# Patient Record
Sex: Female | Born: 1935
Health system: Southern US, Community
[De-identification: ages and names within clinical notes are randomized; demographics above are authoritative.]

## PROBLEM LIST (undated history)

## (undated) DIAGNOSIS — G4733 Obstructive sleep apnea (adult) (pediatric): Secondary | ICD-10-CM

## (undated) DIAGNOSIS — I480 Paroxysmal atrial fibrillation: Secondary | ICD-10-CM

## (undated) DIAGNOSIS — Z86711 Personal history of pulmonary embolism: Secondary | ICD-10-CM

## (undated) DIAGNOSIS — H269 Unspecified cataract: Secondary | ICD-10-CM

## (undated) DIAGNOSIS — R42 Dizziness and giddiness: Secondary | ICD-10-CM

## (undated) DIAGNOSIS — N809 Endometriosis, unspecified: Secondary | ICD-10-CM

## (undated) DIAGNOSIS — J9610 Chronic respiratory failure, unspecified whether with hypoxia or hypercapnia: Secondary | ICD-10-CM

## (undated) DIAGNOSIS — M810 Age-related osteoporosis without current pathological fracture: Secondary | ICD-10-CM

## (undated) DIAGNOSIS — C4490 Unspecified malignant neoplasm of skin, unspecified: Secondary | ICD-10-CM

## (undated) DIAGNOSIS — I10 Essential (primary) hypertension: Secondary | ICD-10-CM

## (undated) DIAGNOSIS — Z9289 Personal history of other medical treatment: Secondary | ICD-10-CM

## (undated) DIAGNOSIS — M1990 Unspecified osteoarthritis, unspecified site: Secondary | ICD-10-CM

## (undated) DIAGNOSIS — L409 Psoriasis, unspecified: Secondary | ICD-10-CM

## (undated) HISTORY — PX: OOPHORECTOMY: SHX86

## (undated) HISTORY — DX: Unspecified osteoarthritis, unspecified site: M19.90

## (undated) HISTORY — DX: Personal history of other medical treatment: Z92.89

## (undated) HISTORY — DX: Essential (primary) hypertension: I10

## (undated) HISTORY — DX: Age-related osteoporosis without current pathological fracture: M81.0

## (undated) HISTORY — DX: Personal history of pulmonary embolism: Z86.711

## (undated) HISTORY — DX: Unspecified malignant neoplasm of skin, unspecified: C44.90

## (undated) HISTORY — PX: UVULOPALATOPHARYNGOPLASTY: SHX827

## (undated) HISTORY — PX: SKIN SURGERY: SHX2413

## (undated) HISTORY — DX: Obstructive sleep apnea (adult) (pediatric): G47.33

## (undated) HISTORY — DX: Endometriosis, unspecified: N80.9

## (undated) HISTORY — PX: OTHER SURGICAL HISTORY: SHX169

## (undated) HISTORY — PX: GALLBLADDER SURGERY: SHX652

## (undated) HISTORY — DX: Morbid (severe) obesity due to excess calories: E66.01

## (undated) HISTORY — DX: Chronic respiratory failure, unspecified whether with hypoxia or hypercapnia: J96.10

## (undated) HISTORY — DX: Dizziness and giddiness: R42

## (undated) HISTORY — DX: Unspecified cataract: H26.9

## (undated) HISTORY — PX: NOSE SURGERY: SHX723

## (undated) HISTORY — DX: Psoriasis, unspecified: L40.9

## (undated) HISTORY — DX: Paroxysmal atrial fibrillation: I48.0

---

## 2004-08-01 ENCOUNTER — Ambulatory Visit (HOSPITAL_COMMUNITY): Admission: RE | Admit: 2004-08-01 | Discharge: 2004-08-01 | Payer: Self-pay | Admitting: Cardiology

## 2004-12-27 ENCOUNTER — Ambulatory Visit: Payer: Self-pay | Admitting: Pulmonary Disease

## 2007-06-11 ENCOUNTER — Encounter: Admission: RE | Admit: 2007-06-11 | Discharge: 2007-06-11 | Payer: Self-pay | Admitting: Family Medicine

## 2008-01-03 ENCOUNTER — Encounter: Payer: Self-pay | Admitting: Internal Medicine

## 2008-09-20 ENCOUNTER — Ambulatory Visit: Payer: Self-pay | Admitting: Interventional Radiology

## 2008-09-20 ENCOUNTER — Emergency Department (HOSPITAL_BASED_OUTPATIENT_CLINIC_OR_DEPARTMENT_OTHER): Admission: EM | Admit: 2008-09-20 | Discharge: 2008-09-20 | Payer: Self-pay | Admitting: Emergency Medicine

## 2008-12-21 ENCOUNTER — Encounter: Admission: RE | Admit: 2008-12-21 | Discharge: 2008-12-21 | Payer: Self-pay | Admitting: Otolaryngology

## 2009-06-18 ENCOUNTER — Emergency Department (HOSPITAL_BASED_OUTPATIENT_CLINIC_OR_DEPARTMENT_OTHER): Admission: EM | Admit: 2009-06-18 | Discharge: 2009-06-18 | Payer: Self-pay | Admitting: Emergency Medicine

## 2009-06-18 ENCOUNTER — Ambulatory Visit: Payer: Self-pay | Admitting: Diagnostic Radiology

## 2010-04-24 ENCOUNTER — Encounter: Payer: Self-pay | Admitting: Family Medicine

## 2010-06-27 LAB — APTT: aPTT: 43 seconds — ABNORMAL HIGH (ref 24–37)

## 2010-06-27 LAB — POCT CARDIAC MARKERS
Myoglobin, poc: 67.1 ng/mL (ref 12–200)
Myoglobin, poc: 87.2 ng/mL (ref 12–200)

## 2010-06-27 LAB — DIFFERENTIAL
Basophils Absolute: 0.2 10*3/uL — ABNORMAL HIGH (ref 0.0–0.1)
Basophils Relative: 2 % — ABNORMAL HIGH (ref 0–1)
Eosinophils Absolute: 0.2 10*3/uL (ref 0.0–0.7)
Monocytes Absolute: 0.7 10*3/uL (ref 0.1–1.0)
Monocytes Relative: 10 % (ref 3–12)
Neutrophils Relative %: 57 % (ref 43–77)

## 2010-06-27 LAB — CBC
HCT: 39.1 % (ref 36.0–46.0)
Hemoglobin: 13.3 g/dL (ref 12.0–15.0)
Platelets: 299 10*3/uL (ref 150–400)
WBC: 7.1 10*3/uL (ref 4.0–10.5)

## 2010-06-27 LAB — COMPREHENSIVE METABOLIC PANEL
ALT: 16 U/L (ref 0–35)
AST: 22 U/L (ref 0–37)
Albumin: 3.8 g/dL (ref 3.5–5.2)
CO2: 32 mEq/L (ref 19–32)
Calcium: 9.1 mg/dL (ref 8.4–10.5)
Chloride: 104 mEq/L (ref 96–112)
Glucose, Bld: 123 mg/dL — ABNORMAL HIGH (ref 70–99)
Total Protein: 7.5 g/dL (ref 6.0–8.3)

## 2010-06-27 LAB — PROTIME-INR
INR: 2.36 — ABNORMAL HIGH (ref 0.00–1.49)
Prothrombin Time: 25.6 seconds — ABNORMAL HIGH (ref 11.6–15.2)

## 2010-06-27 LAB — D-DIMER, QUANTITATIVE: D-Dimer, Quant: 0.31 ug/mL-FEU (ref 0.00–0.48)

## 2010-07-11 LAB — URINALYSIS, ROUTINE W REFLEX MICROSCOPIC
Ketones, ur: NEGATIVE mg/dL
Protein, ur: NEGATIVE mg/dL
Specific Gravity, Urine: 1.009 (ref 1.005–1.030)
Urobilinogen, UA: 0.2 mg/dL (ref 0.0–1.0)
pH: 7.5 (ref 5.0–8.0)

## 2010-07-11 LAB — BASIC METABOLIC PANEL
CO2: 34 mEq/L — ABNORMAL HIGH (ref 19–32)
Calcium: 9.4 mg/dL (ref 8.4–10.5)
Glucose, Bld: 88 mg/dL (ref 70–99)
Sodium: 143 mEq/L (ref 135–145)

## 2010-07-11 LAB — DIFFERENTIAL
Lymphs Abs: 1.9 10*3/uL (ref 0.7–4.0)
Monocytes Absolute: 0.5 10*3/uL (ref 0.1–1.0)
Neutrophils Relative %: 58 % (ref 43–77)

## 2010-07-11 LAB — CBC
HCT: 45.6 % (ref 36.0–46.0)
Hemoglobin: 13 g/dL (ref 12.0–15.0)
Platelets: 314 10*3/uL (ref 150–400)
RDW: 15.1 % (ref 11.5–15.5)
WBC: 6.2 10*3/uL (ref 4.0–10.5)

## 2010-07-11 LAB — PROTIME-INR: INR: 2.2 — ABNORMAL HIGH (ref 0.00–1.49)

## 2011-04-11 ENCOUNTER — Emergency Department (INDEPENDENT_AMBULATORY_CARE_PROVIDER_SITE_OTHER): Payer: PRIVATE HEALTH INSURANCE

## 2011-04-11 ENCOUNTER — Encounter (HOSPITAL_BASED_OUTPATIENT_CLINIC_OR_DEPARTMENT_OTHER): Payer: Self-pay | Admitting: Radiology

## 2011-04-11 ENCOUNTER — Emergency Department (HOSPITAL_BASED_OUTPATIENT_CLINIC_OR_DEPARTMENT_OTHER): Payer: PRIVATE HEALTH INSURANCE

## 2011-04-11 ENCOUNTER — Emergency Department (HOSPITAL_BASED_OUTPATIENT_CLINIC_OR_DEPARTMENT_OTHER)
Admission: EM | Admit: 2011-04-11 | Discharge: 2011-04-11 | Disposition: A | Discharge status: Home / Self Care | Payer: PRIVATE HEALTH INSURANCE | Attending: Emergency Medicine | Admitting: Emergency Medicine

## 2011-04-11 DIAGNOSIS — M7989 Other specified soft tissue disorders: Secondary | ICD-10-CM | POA: Insufficient documentation

## 2011-04-11 DIAGNOSIS — Z86718 Personal history of other venous thrombosis and embolism: Secondary | ICD-10-CM | POA: Insufficient documentation

## 2011-04-11 DIAGNOSIS — R911 Solitary pulmonary nodule: Secondary | ICD-10-CM

## 2011-04-11 DIAGNOSIS — R002 Palpitations: Secondary | ICD-10-CM | POA: Insufficient documentation

## 2011-04-11 DIAGNOSIS — M25519 Pain in unspecified shoulder: Secondary | ICD-10-CM | POA: Insufficient documentation

## 2011-04-11 DIAGNOSIS — R220 Localized swelling, mass and lump, head: Secondary | ICD-10-CM | POA: Insufficient documentation

## 2011-04-11 DIAGNOSIS — Z7901 Long term (current) use of anticoagulants: Secondary | ICD-10-CM | POA: Insufficient documentation

## 2011-04-11 DIAGNOSIS — M259 Joint disorder, unspecified: Secondary | ICD-10-CM | POA: Insufficient documentation

## 2011-04-11 DIAGNOSIS — R937 Abnormal findings on diagnostic imaging of other parts of musculoskeletal system: Secondary | ICD-10-CM

## 2011-04-11 DIAGNOSIS — R221 Localized swelling, mass and lump, neck: Secondary | ICD-10-CM | POA: Insufficient documentation

## 2011-04-11 DIAGNOSIS — J984 Other disorders of lung: Secondary | ICD-10-CM | POA: Insufficient documentation

## 2011-04-11 DIAGNOSIS — Z79899 Other long term (current) drug therapy: Secondary | ICD-10-CM | POA: Insufficient documentation

## 2011-04-11 DIAGNOSIS — M898X1 Other specified disorders of bone, shoulder: Secondary | ICD-10-CM

## 2011-04-11 LAB — COMPREHENSIVE METABOLIC PANEL
BUN: 12 mg/dL (ref 6–23)
Calcium: 9.7 mg/dL (ref 8.4–10.5)
Creatinine, Ser: 0.8 mg/dL (ref 0.50–1.10)
GFR calc Af Amer: 80 mL/min — ABNORMAL LOW (ref >90)
GFR calc non Af Amer: 69 mL/min — ABNORMAL LOW (ref >90)
Glucose, Bld: 110 mg/dL — ABNORMAL HIGH (ref 70–99)
Total Protein: 7.3 g/dL (ref 6.0–8.3)

## 2011-04-11 LAB — CBC
HCT: 41.7 % (ref 36.0–46.0)
Hemoglobin: 13.5 g/dL (ref 12.0–15.0)
MCH: 30.3 pg (ref 26.0–34.0)
MCV: 93.7 fL (ref 78.0–100.0)
Platelets: 297 10*3/uL (ref 150–400)
RBC: 4.45 MIL/uL (ref 3.87–5.11)

## 2011-04-11 LAB — DIFFERENTIAL
Eosinophils Absolute: 0.1 10*3/uL (ref 0.0–0.7)
Eosinophils Relative: 2 % (ref 0–5)
Lymphs Abs: 1.7 10*3/uL (ref 0.7–4.0)
Monocytes Absolute: 0.7 10*3/uL (ref 0.1–1.0)
Monocytes Relative: 11 % (ref 3–12)

## 2011-04-11 LAB — URINALYSIS, ROUTINE W REFLEX MICROSCOPIC
Leukocytes, UA: NEGATIVE
Nitrite: NEGATIVE
Specific Gravity, Urine: 1.007 (ref 1.005–1.030)
Urobilinogen, UA: 0.2 mg/dL (ref 0.0–1.0)
pH: 7 (ref 5.0–8.0)

## 2011-04-11 LAB — URINE CULTURE: Culture  Setup Time: 201301082133

## 2011-04-11 LAB — APTT: aPTT: 44 seconds — ABNORMAL HIGH (ref 24–37)

## 2011-04-11 LAB — PROTIME-INR: Prothrombin Time: 25.6 seconds — ABNORMAL HIGH (ref 11.6–15.2)

## 2011-04-11 LAB — D-DIMER, QUANTITATIVE: D-Dimer, Quant: <0.22 ug/mL-FEU (ref 0.00–0.48)

## 2011-04-11 MED ORDER — IOHEXOL 300 MG/ML  SOLN
100.0000 mL | Freq: Once | INTRAMUSCULAR | Status: AC | PRN
  Administered 2011-04-11: 100 mL via INTRAVENOUS

## 2011-04-11 NOTE — ED Notes (Signed)
MRI staff at bedside

## 2011-04-11 NOTE — ED Notes (Signed)
Pt c/o RUE pain & swelling x >1  Week; has seen PMD for same; condition not improving

## 2011-04-11 NOTE — ED Notes (Signed)
Patient transported to MRI 

## 2011-04-11 NOTE — ED Notes (Signed)
Patient transported to CT 

## 2011-04-11 NOTE — ED Provider Notes (Signed)
Imaging reviewed and unremarkable for concerning cause of pt's symptoms. Incidental lung nodule on CT. Printed out results and gave patient copy to share with her PMD. Aware that follow up is needed to further characterize. Family at beside during discussion.  BP 131/61  Pulse 59  Temp(Src) 97.9 F (36.6 C) (Oral)  Resp 18  Wt 300 lb (136.079 kg)  SpO2 96%   Blair Heys, MD 04/11/11 1914

## 2011-04-11 NOTE — ED Provider Notes (Signed)
History     CSN: [id number]  Arrival date & time 04/11/11  1248   First MD Initiated Contact with Patient 04/11/11 1254      Chief Complaint  Patient presents with  . Arm Pain    (Consider location/radiation/quality/duration/timing/severity/associated sxs/prior treatment) Patient is a 78 y.o. female presenting with shoulder pain. The history is provided by the patient. No language interpreter was used.  Shoulder Pain This is a new problem. The current episode started more than 1 week ago. The problem occurs constantly (Patient has had swelling in her right scapular region and right upper arm for about a week. She had seen her doctor about this and they have recommended MRI of her right shoulder. She also notes some palpitations, which has been a problem in the past. ). The problem has not changed since onset.Associated symptoms comments: Palpitations. The symptoms are aggravated by nothing. The symptoms are relieved by nothing. She has tried nothing (It is noteworthy that patient has been on Coumadin since 2006, when she had bilateral pulmonary emboli.) for the symptoms.    No past medical history on file.  No past surgical history on file.  No family history on file.  History  Substance Use Topics  . Smoking status: Not on file  . Smokeless tobacco: Not on file  . Alcohol Use: Not on file    OB History    No data available      Review of Systems  Constitutional: Negative.  Negative for fever and chills.  HENT: Negative.   Eyes: Negative.   Respiratory: Negative.   Cardiovascular: Positive for palpitations.  Gastrointestinal: Negative.   Genitourinary: Negative.   Musculoskeletal:       She has swelling in the right upper arm and right scapular region. There is no history of injury.  Skin: Negative.   Neurological: Negative.   Psychiatric/Behavioral: Negative.     Allergies  Review of patient's allergies indicates not on file.  Home Medications   Current  Outpatient Rx  Name Route Sig Dispense Refill  . ATENOLOL 25 MG PO TABS Oral Take 25 mg by mouth 2 (two) times daily.     Marland Kitchen BIMATOPROST 0.01 % OP SOLN Both Eyes Place 1 drop into both eyes at bedtime.     Marland Kitchen BRIMONIDINE TARTRATE-TIMOLOL 0.2-0.5 % OP SOLN Both Eyes Place 1 drop into both eyes every 12 (twelve) hours.     Marland Kitchen BRINZOLAMIDE 1 % OP SUSP Both Eyes Place 1 drop into both eyes 2 (two) times daily.     Marland Kitchen DILTIAZEM HCL ER 180 MG PO CP24 Oral Take 180 mg by mouth daily.      . FUROSEMIDE 20 MG PO TABS Oral Take 20 mg by mouth 2 (two) times daily.      Marland Kitchen HYDROCODONE-ACETAMINOPHEN 10-500 MG PO TABS Oral Take 1 tablet by mouth every 6 (six) hours as needed.      Marland Kitchen LORAZEPAM 1 MG PO TABS Oral Take 1 mg by mouth every 6 (six) hours as needed.      Marland Kitchen MECLIZINE HCL 25 MG PO TABS Oral Take 25 mg by mouth every 6 (six) hours as needed.      . WARFARIN SODIUM 2.5 MG PO TABS Oral Take 2.5 mg by mouth daily. Except Sunday     . WARFARIN SODIUM 2.5 MG PO TABS Oral Take 3.75 mg by mouth every Sunday. Sundays only       BP 162/57  Pulse 100  Temp(Src) 97.9 F (  36.6 C) (Oral)  Resp 18  SpO2 100%  Physical Exam  Nursing note and vitals reviewed. Constitutional: She is oriented to person, place, and time.       Morbidly obese woman in no distress  HENT:  Head: Normocephalic and atraumatic.  Right Ear: External ear normal.  Left Ear: External ear normal.  Mouth/Throat: Oropharynx is clear and moist.  Eyes: Conjunctivae and EOM are normal. Pupils are equal, round, and reactive to light. No scleral icterus.  Neck: Normal range of motion.  Cardiovascular: Normal rate, regular rhythm and normal heart sounds.   Pulmonary/Chest: Effort normal and breath sounds normal.  Abdominal: Soft. Bowel sounds are normal.  Musculoskeletal:       She has swelling over the right scapula and on the inner aspect of the right upper arm just below the humeral head. There is no redness heat or point tenderness.    Neurological: She is alert and oriented to person, place, and time.       No sensory or motor deficit.  Skin: Skin is warm and dry.  Psychiatric: She has a normal mood and affect. Her behavior is normal.    ED Course  Procedures (including critical care time)   Labs Reviewed  CBC  DIFFERENTIAL  COMPREHENSIVE METABOLIC PANEL  URINALYSIS, ROUTINE W REFLEX MICROSCOPIC  URINE CULTURE  PROTIME-INR  APTT  D-DIMER, QUANTITATIVE   1:34 PM Case discussed with Lorriane Shire M.D., radiologist. He advised that if we do MRI on the patient's right scapular region that we should use IV contrast, as well we are studying is a soft tissue mass.  2:18 PM Lab tests are reassuringly normal.  MRI of right shoulder with contrast ordered.  3:45 PM Pt going to MRI now.     MRI of right shoulder was negative.  CT of right shoulder showed a right upper lung nodule.  Referred back to her PCP for followup of this nodult.   1. Lung nodule   2. Arm swelling   3. Pain in scapula           Mylinda Latina III, MD 04/11/11 2014

## 2012-01-31 ENCOUNTER — Encounter: Payer: Self-pay | Admitting: *Deleted

## 2012-02-05 ENCOUNTER — Encounter: Payer: Self-pay | Admitting: Cardiovascular Disease

## 2012-02-05 ENCOUNTER — Ambulatory Visit (INDEPENDENT_AMBULATORY_CARE_PROVIDER_SITE_OTHER): Payer: PRIVATE HEALTH INSURANCE | Admitting: Cardiovascular Disease

## 2012-02-05 VITALS — BP 140/60 | HR 53 | Ht 66.0 in | Wt 242.8 lb

## 2012-02-05 DIAGNOSIS — I480 Paroxysmal atrial fibrillation: Secondary | ICD-10-CM

## 2012-02-05 DIAGNOSIS — I4891 Unspecified atrial fibrillation: Secondary | ICD-10-CM

## 2012-02-05 DIAGNOSIS — I10 Essential (primary) hypertension: Secondary | ICD-10-CM

## 2012-02-05 MED ORDER — DILTIAZEM HCL ER 240 MG PO CP24: 240.0000 mg | ORAL_CAPSULE | Freq: Every day | ORAL | Status: DC

## 2012-02-05 MED ORDER — ATENOLOL 25 MG PO TABS: ORAL_TABLET | ORAL | Status: DC

## 2012-02-05 NOTE — Assessment & Plan Note (Signed)
Jacqueline Nguyen presents with a hx of PAF.  She has several contributing factors - pulmonary emboli, obesity, sleep apnea, HTN.  She is on coumadin and her afib is not all that symptomatic.  I spent a long time explaining all of these issues to her and her son.  She seems to understand.   She frequently calls EMS when she has palpitations.  I have told her that she does not have to call EMS every time she has a palpitation.  We will continue her current meds . I will see her in 6 months.

## 2012-02-05 NOTE — Progress Notes (Signed)
Jacqueline Nguyen Date of Birth  08/31/1931       [facility]    [employer] [address], [place], [place]  [address]   [place], [place]  [address] [phone]     [phone]   Fax  [phone]    Fax [phone]  Problem List: 1.  Pulmonary embolus - 2009 massive bilateral PE 2. Hypertension 3. osteoperosis - severe degener 4.  Morbid obesity 5. Paroxysmal Atrial Fibrillation 6  sleep apnea - wears CPAP   History of Present Illness:  Jacqueline Nguyen is a 78 yo - former patient of Dr. Jory Sims - transferred care here. She was accompanied by her son, Jacqueline Nguyen today. She has had PAF and has had recent worsening episodes of her PAF.  She walks with a walker.    She has numerous complaints and appears to be easily distracted. It Was very helpful have her son present today. He was able to help her stay on target with are interview.   Current Outpatient Prescriptions on File Prior to Visit  Medication Sig Dispense Refill  . Acetylcysteine (NAC 600 PO) Take 600 mg by mouth daily as needed.      . Ascorbic Acid (VITAMIN C PO) Take 600 mg by mouth daily.      Marland Kitchen atenolol (TENORMIN) 25 MG tablet Take 25 mg by mouth 2 (two) times daily.       . bimatoprost (LUMIGAN) 0.01 % SOLN Place 1 drop into both eyes at bedtime.       . brimonidine-timolol (COMBIGAN) 0.2-0.5 % ophthalmic solution Place 1 drop into both eyes every 12 (twelve) hours.       . brinzolamide (AZOPT) 1 % ophthalmic suspension Place 1 drop into both eyes 2 (two) times daily.       Marland Kitchen diltiazem (DILACOR XR) 180 MG 24 hr capsule Take 180 mg by mouth daily.        . furosemide (LASIX) 20 MG tablet Take 20 mg by mouth as needed.       . Glucosamine Sulfate-MSM (MSM/GLUCOSAMINE) CREA Apply topically daily.      . Homeopathic Products (ARNICARE ARNICA) CREA Apply topically daily.      Marland Kitchen HYDROcodone-acetaminophen (LORTAB) 10-500 MG per tablet Take 1 tablet by mouth every 6 (six) hours as  needed.        Marland Kitchen LORazepam (ATIVAN) 1 MG tablet Take 1 mg by mouth every 6 (six) hours as needed.        Marland Kitchen MAGNESIUM PO Take by mouth daily.      . meclizine (ANTIVERT) 25 MG tablet Take 25 mg by mouth every 6 (six) hours as needed.        . Methylsulfonylmethane (MSM PO) Take 300 mg by mouth daily.      . Multiple Minerals (MINERAL COMPLEX PO) Take by mouth 3 (three) times daily.      Marland Kitchen OLIVE LEAF EXTRACT PO Take 450 mg by mouth daily.      . Omega-3 Fatty Acids (OMEGA 3 PO) Take 820 mg by mouth daily.      . Probiotic Product (PROBIOTIC DAILY PO) Take by mouth daily.      . Turmeric 500 MG CAPS Take 500 mg by mouth daily as needed.      . warfarin (COUMADIN) 2.5 MG tablet Take 2.5 mg by mouth daily. Except Sunday       . warfarin (COUMADIN) 2.5 MG tablet Take 3.75 mg by  mouth every Sunday. Sundays only         Allergies  Allergen Reactions  . Ciprofloxacin   . Bactrim Rash  . Codeine Nausea Only    Past Medical History  Diagnosis Date  . Obesities, morbid   . Skin cancer   . Cataract   . Sleep apnea   . Osteoporosis   . Arthritis   . Vertigo   . Blood clotting disorder     In Lungs  . Endometriosis     Past Surgical History  Procedure Date  . Oophorectomy   . Nose surgery   . Skin surgery   . Gallbladder surgery   . Uvulopalatopharyngoplasty   . Cataract surgery     History  Smoking status  . Former Smoker  Smokeless tobacco  . Not on file    History  Alcohol Use     History reviewed. No pertinent family history.  Reviw of Systems:  Reviewed in the HPI.  All other systems are negative.  Physical Exam: Blood pressure 140/60, pulse 53, height _0  (1.676 m), weight 242 lb 12.8 oz (110.133 kg). General: Well developed, well nourished, in no acute distress.  Head: Normocephalic, atraumatic, sclera non-icteric, mucus membranes are moist,   Neck: Supple. Carotids are 2 + without bruits. No JVD  Lungs: Clear bilaterally to auscultation.  Heart:  regular rate.  normal  S1 S2. No murmurs, gallops or rubs.  Abdomen: Soft, non-tender, non-distended with normal bowel sounds. No hepatomegaly. No rebound/guarding. No masses.  Msk:  Strength and tone are normal  Extremities: No clubbing or cyanosis. No edema.  Distal pedal pulses are 2+ and equal bilaterally.  Neuro: Alert and oriented X 3. Moves all extremities spontaneously.  Psych:  Responds to questions appropriately with a normal affect.  ECG: Nov. 4, 2013 - sinus brady at 53, no ST  Assessment / Plan:

## 2012-02-05 NOTE — Patient Instructions (Addendum)
Your physician wants you to follow-up in: 6 months  You will receive a reminder letter in the mail two months in advance. If you don't receive a letter, please call our office to schedule the follow-up appointment.  Your physician recommends that you continue on your current medications as directed. Please refer to the Current Medication list given to you today.

## 2012-02-08 ENCOUNTER — Telehealth: Payer: Self-pay | Admitting: Cardiovascular Disease

## 2012-02-08 NOTE — Telephone Encounter (Signed)
New problem:   Need a brief summary or conclusion from last office visit .

## 2012-02-08 NOTE — Telephone Encounter (Signed)
Mailed her another  AVS, pt agreed to plan.

## 2012-05-06 ENCOUNTER — Telehealth: Payer: Self-pay | Admitting: Cardiovascular Disease

## 2012-05-06 NOTE — Telephone Encounter (Signed)
New Problem:    Patient called returning a call.  Please call back.

## 2012-05-06 NOTE — Telephone Encounter (Signed)
Patient called after receiving a letter from the insurance company regarding Diltiazem and changes to benefits regarding generic form of medicine versus Brand Name version. Spoke to [employer]. Pharmacist states patient has been and is taking generic Diltiazem currently. RN phoned patient back to let her know that she is currently on the generic form.  Also patient had questions regarding insurance company's note regarding in home coumadin checks. Advised patient that she will need to contact PCP Learta Codding) on this issue since they are the providers that oversee her Coumadin.  She agreed to plan.  Verified the above information with patient's son as he was there with patient and wanted to confirm plan.

## 2012-05-06 NOTE — Telephone Encounter (Signed)
New Problem     Pt wants to make sure diltiazem stays the same and isn't changed to generic form.   Pt is requesting Dr. Acie Fredrickson send a note/verification to insurance company stating it is necessary for pt to continue doing in home coumadin checks. Insurance company is stating they are no longer going to cover her in home checks and for her to go in and for checks. Pt states it would be extremely difficult for her to come in to check her coumadin as frequently as she would need to.  Please call pt. Would like to speak to nurse about these two problems.

## 2012-08-29 ENCOUNTER — Inpatient Hospital Stay (HOSPITAL_BASED_OUTPATIENT_CLINIC_OR_DEPARTMENT_OTHER)
Admission: EM | Admit: 2012-08-29 | Discharge: 2012-08-31 | DRG: 123 | Disposition: A | Discharge status: Home / Self Care | Payer: Medicare Other | Attending: Internal Medicine | Admitting: Internal Medicine

## 2012-08-29 ENCOUNTER — Emergency Department (HOSPITAL_BASED_OUTPATIENT_CLINIC_OR_DEPARTMENT_OTHER): Payer: Medicare Other

## 2012-08-29 ENCOUNTER — Encounter (HOSPITAL_BASED_OUTPATIENT_CLINIC_OR_DEPARTMENT_OTHER): Payer: Self-pay | Admitting: *Deleted

## 2012-08-29 DIAGNOSIS — Z9981 Dependence on supplemental oxygen: Secondary | ICD-10-CM

## 2012-08-29 DIAGNOSIS — D689 Coagulation defect, unspecified: Secondary | ICD-10-CM | POA: Diagnosis present

## 2012-08-29 DIAGNOSIS — H5460 Unqualified visual loss, one eye, unspecified: Secondary | ICD-10-CM | POA: Diagnosis not present

## 2012-08-29 DIAGNOSIS — I10 Essential (primary) hypertension: Secondary | ICD-10-CM | POA: Diagnosis present

## 2012-08-29 DIAGNOSIS — I517 Cardiomegaly: Secondary | ICD-10-CM

## 2012-08-29 DIAGNOSIS — R42 Dizziness and giddiness: Secondary | ICD-10-CM | POA: Diagnosis present

## 2012-08-29 DIAGNOSIS — E785 Hyperlipidemia, unspecified: Secondary | ICD-10-CM | POA: Diagnosis present

## 2012-08-29 DIAGNOSIS — G453 Amaurosis fugax: Secondary | ICD-10-CM

## 2012-08-29 DIAGNOSIS — I4891 Unspecified atrial fibrillation: Secondary | ICD-10-CM | POA: Diagnosis present

## 2012-08-29 DIAGNOSIS — I480 Paroxysmal atrial fibrillation: Secondary | ICD-10-CM | POA: Diagnosis present

## 2012-08-29 DIAGNOSIS — M810 Age-related osteoporosis without current pathological fracture: Secondary | ICD-10-CM | POA: Diagnosis present

## 2012-08-29 DIAGNOSIS — Z9849 Cataract extraction status, unspecified eye: Secondary | ICD-10-CM

## 2012-08-29 DIAGNOSIS — Z5181 Encounter for therapeutic drug level monitoring: Secondary | ICD-10-CM

## 2012-08-29 DIAGNOSIS — M1610 Unilateral primary osteoarthritis, unspecified hip: Secondary | ICD-10-CM | POA: Insufficient documentation

## 2012-08-29 DIAGNOSIS — H538 Other visual disturbances: Secondary | ICD-10-CM

## 2012-08-29 DIAGNOSIS — H3400 Transient retinal artery occlusion, unspecified eye: Principal | ICD-10-CM | POA: Diagnosis present

## 2012-08-29 DIAGNOSIS — Z87891 Personal history of nicotine dependence: Secondary | ICD-10-CM

## 2012-08-29 DIAGNOSIS — Z6841 Body Mass Index (BMI) 40.0 and over, adult: Secondary | ICD-10-CM

## 2012-08-29 DIAGNOSIS — Z79899 Other long term (current) drug therapy: Secondary | ICD-10-CM

## 2012-08-29 DIAGNOSIS — M129 Arthropathy, unspecified: Secondary | ICD-10-CM | POA: Diagnosis present

## 2012-08-29 DIAGNOSIS — H269 Unspecified cataract: Secondary | ICD-10-CM | POA: Diagnosis present

## 2012-08-29 DIAGNOSIS — G4733 Obstructive sleep apnea (adult) (pediatric): Secondary | ICD-10-CM | POA: Diagnosis present

## 2012-08-29 DIAGNOSIS — M1990 Unspecified osteoarthritis, unspecified site: Secondary | ICD-10-CM

## 2012-08-29 DIAGNOSIS — Z7901 Long term (current) use of anticoagulants: Secondary | ICD-10-CM

## 2012-08-29 DIAGNOSIS — Z85828 Personal history of other malignant neoplasm of skin: Secondary | ICD-10-CM

## 2012-08-29 DIAGNOSIS — Z86711 Personal history of pulmonary embolism: Secondary | ICD-10-CM

## 2012-08-29 LAB — CBC WITH DIFFERENTIAL/PLATELET
Basophils Absolute: 0 10*3/uL (ref 0.0–0.1)
Basophils Relative: 0 % (ref 0–1)
Eosinophils Absolute: 0.1 10*3/uL (ref 0.0–0.7)
Eosinophils Relative: 2 % (ref 0–5)
HCT: 43.3 % (ref 36.0–46.0)
Hemoglobin: 14 g/dL (ref 12.0–15.0)
Lymphocytes Relative: 25 % (ref 12–46)
Lymphs Abs: 1.7 10*3/uL (ref 0.7–4.0)
MCH: 30.1 pg (ref 26.0–34.0)
MCHC: 32.3 g/dL (ref 30.0–36.0)
MCV: 93.1 fL (ref 78.0–100.0)
Monocytes Absolute: 0.7 10*3/uL (ref 0.1–1.0)
Monocytes Relative: 9 % (ref 3–12)
Neutro Abs: 4.5 10*3/uL (ref 1.7–7.7)
Neutrophils Relative %: 64 % (ref 43–77)
Platelets: 323 10*3/uL (ref 150–400)
RBC: 4.65 MIL/uL (ref 3.87–5.11)
RDW: 14.3 % (ref 11.5–15.5)
WBC: 7.1 10*3/uL (ref 4.0–10.5)

## 2012-08-29 LAB — URINALYSIS, ROUTINE W REFLEX MICROSCOPIC
Bilirubin Urine: NEGATIVE
Glucose, UA: NEGATIVE mg/dL
Hgb urine dipstick: NEGATIVE
Ketones, ur: NEGATIVE mg/dL
Leukocytes, UA: NEGATIVE
Nitrite: NEGATIVE
Protein, ur: NEGATIVE mg/dL
Specific Gravity, Urine: 1.004 — ABNORMAL LOW (ref 1.005–1.030)
Urobilinogen, UA: 0.2 mg/dL (ref 0.0–1.0)
pH: 7 (ref 5.0–8.0)

## 2012-08-29 LAB — BASIC METABOLIC PANEL
BUN: 12 mg/dL (ref 6–23)
CO2: 31 mEq/L (ref 19–32)
Calcium: 9.7 mg/dL (ref 8.4–10.5)
Chloride: 103 mEq/L (ref 96–112)
Creatinine, Ser: 0.7 mg/dL (ref 0.50–1.10)
GFR calc Af Amer: >90 mL/min (ref >90)
GFR calc non Af Amer: 81 mL/min — ABNORMAL LOW (ref >90)
Glucose, Bld: 125 mg/dL — ABNORMAL HIGH (ref 70–99)
Potassium: 4 mEq/L (ref 3.5–5.1)
Sodium: 143 mEq/L (ref 135–145)

## 2012-08-29 LAB — PROTIME-INR
INR: 2.42 — ABNORMAL HIGH (ref 0.00–1.49)
Prothrombin Time: 25.2 seconds — ABNORMAL HIGH (ref 11.6–15.2)

## 2012-08-29 LAB — SEDIMENTATION RATE: Sed Rate: 24 mm/hr — ABNORMAL HIGH (ref 0–22)

## 2012-08-29 MED ORDER — BRIMONIDINE TARTRATE 0.2 % OP SOLN
1.0000 [drp] | Freq: Two times a day (BID) | OPHTHALMIC | Status: DC
  Filled 2012-08-29: qty 5

## 2012-08-29 MED ORDER — HYDROCODONE-ACETAMINOPHEN 7.5-325 MG PO TABS
1.0000 | ORAL_TABLET | Freq: Four times a day (QID) | ORAL | Status: DC | PRN
  Administered 2012-08-30 – 2012-08-31 (×5): 1 via ORAL
  Filled 2012-08-29 (×5): qty 1

## 2012-08-29 MED ORDER — SENNOSIDES-DOCUSATE SODIUM 8.6-50 MG PO TABS
1.0000 | ORAL_TABLET | Freq: Every evening | ORAL | Status: DC | PRN
  Administered 2012-08-30: 1 via ORAL
  Filled 2012-08-29: qty 1

## 2012-08-29 MED ORDER — WARFARIN SODIUM 2.5 MG PO TABS
2.5000 mg | ORAL_TABLET | Freq: Once | ORAL | Status: AC
  Administered 2012-08-29: 2.5 mg via ORAL
  Filled 2012-08-29: qty 1

## 2012-08-29 MED ORDER — BISACODYL 10 MG RE SUPP
10.0000 mg | Freq: Once | RECTAL | Status: AC
  Administered 2012-08-29: 10 mg via RECTAL
  Filled 2012-08-29: qty 1

## 2012-08-29 MED ORDER — MELOXICAM 7.5 MG PO TABS
7.5000 mg | ORAL_TABLET | Freq: Every day | ORAL | Status: DC
  Administered 2012-08-31: 7.5 mg via ORAL
  Filled 2012-08-29 (×3): qty 1

## 2012-08-29 MED ORDER — TIMOLOL MALEATE 0.5 % OP SOLN
1.0000 [drp] | Freq: Two times a day (BID) | OPHTHALMIC | Status: DC
  Administered 2012-08-30 – 2012-08-31 (×2): 1 [drp] via OPHTHALMIC
  Filled 2012-08-29: qty 5

## 2012-08-29 MED ORDER — ATENOLOL 25 MG PO TABS
25.0000 mg | ORAL_TABLET | Freq: Two times a day (BID) | ORAL | Status: DC
  Administered 2012-08-29 – 2012-08-31 (×4): 25 mg via ORAL
  Filled 2012-08-29 (×6): qty 1

## 2012-08-29 MED ORDER — BRIMONIDINE TARTRATE-TIMOLOL 0.2-0.5 % OP SOLN
1.0000 [drp] | Freq: Two times a day (BID) | OPHTHALMIC | Status: DC
  Filled 2012-08-29 (×17): qty 0.1

## 2012-08-29 MED ORDER — DILTIAZEM HCL ER 240 MG PO CP24
240.0000 mg | ORAL_CAPSULE | Freq: Every day | ORAL | Status: DC
  Administered 2012-08-30 – 2012-08-31 (×2): 240 mg via ORAL
  Filled 2012-08-29 (×3): qty 1

## 2012-08-29 MED ORDER — BRINZOLAMIDE 1 % OP SUSP
1.0000 [drp] | Freq: Two times a day (BID) | OPHTHALMIC | Status: DC
  Administered 2012-08-29 – 2012-08-31 (×3): 1 [drp] via OPHTHALMIC
  Filled 2012-08-29: qty 10

## 2012-08-29 MED ORDER — FLUTICASONE PROPIONATE HFA 44 MCG/ACT IN AERO
2.0000 | INHALATION_SPRAY | Freq: Two times a day (BID) | RESPIRATORY_TRACT | Status: DC
  Administered 2012-08-29 – 2012-08-31 (×3): 2 via RESPIRATORY_TRACT
  Filled 2012-08-29: qty 10.6

## 2012-08-29 MED ORDER — FLEET ENEMA 7-19 GM/118ML RE ENEM
1.0000 | ENEMA | Freq: Every day | RECTAL | Status: DC | PRN
  Administered 2012-08-30: 1 via RECTAL
  Filled 2012-08-29: qty 1

## 2012-08-29 MED ORDER — BUDESONIDE-FORMOTEROL FUMARATE 160-4.5 MCG/ACT IN AERO
2.0000 | INHALATION_SPRAY | Freq: Two times a day (BID) | RESPIRATORY_TRACT | Status: DC
  Administered 2012-08-29 – 2012-08-31 (×3): 2 via RESPIRATORY_TRACT
  Filled 2012-08-29: qty 6

## 2012-08-29 MED ORDER — LORAZEPAM 1 MG PO TABS
1.0000 mg | ORAL_TABLET | ORAL | Status: DC | PRN
  Administered 2012-08-30 – 2012-08-31 (×3): 1 mg via ORAL
  Filled 2012-08-29 (×3): qty 1

## 2012-08-29 MED ORDER — WARFARIN - PHARMACIST DOSING INPATIENT: Freq: Every day | Status: DC

## 2012-08-29 MED ORDER — ENOXAPARIN SODIUM 40 MG/0.4ML ~~LOC~~ SOLN: 40.0000 mg | SUBCUTANEOUS | Status: DC

## 2012-08-29 MED ORDER — BRIMONIDINE TARTRATE-TIMOLOL 0.2-0.5 % OP SOLN
1.0000 [drp] | Freq: Two times a day (BID) | OPHTHALMIC | Status: DC
  Filled 2012-08-29: qty 5

## 2012-08-29 NOTE — ED Provider Notes (Addendum)
History    80yF with multiple complaints. Tangential historian and needing repeated redirection, but CC of painless R monocular vision loss. Onset yesterday night while sitting at computer. Vision just went black and could not see anything out of R eye. Has since significantly improved and close to, if not back to, basline. Still feels like vision slightly blurred. Pt with recent cataract surgery and saw her eye doctor for this same complaint. "He checked my eyes thoroughly. He told me he wasn't exactly sure what was going on, but that he didn't like it." Pt not sure what specifically though.  Apparently concerned enough to call PCP to arrange for evaluation today including carotid doppler and echocardiogram. Pt with no other acute neurological complaints. Denies HA. Hx of PAF on coumadin. Felt like she was in a afib this morning but palpitations improved after taking am atenolol. Past history also significant for PE, HTN, sleep apnea.   CSN: [id number]  Arrival date & time 08/29/12  2992   None     Chief Complaint  Patient presents with  . Weakness  . Palpitations    (Consider location/radiation/quality/duration/timing/severity/associated sxs/prior treatment) HPI  Past Medical History  Diagnosis Date  . Obesities, morbid   . Skin cancer   . Cataract   . Sleep apnea   . Osteoporosis   . Arthritis   . Vertigo   . Blood clotting disorder     In Lungs  . Endometriosis     Past Surgical History  Procedure Laterality Date  . Oophorectomy    . Nose surgery    . Skin surgery    . Gallbladder surgery    . Uvulopalatopharyngoplasty    . Cataract surgery      History reviewed. No pertinent family history.  History  Substance Use Topics  . Smoking status: Former Research scientist (life sciences)  . Smokeless tobacco: Not on file  . Alcohol Use: No    OB History   Grav Para Term Preterm Abortions TAB SAB Ect Mult Living                  Review of Systems  All systems reviewed and negative, other  than as noted in HPI.   Allergies  Ciprofloxacin; Bactrim; and Codeine  Home Medications   Current Outpatient Rx  Name  Route  Sig  Dispense  Refill  . Acetylcysteine (NAC 600 PO)   Oral   Take 600 mg by mouth daily as needed.         . Ascorbic Acid (VITAMIN C PO)   Oral   Take 600 mg by mouth daily.         Marland Kitchen atenolol (TENORMIN) 25 MG tablet      50 mg in morning and 25 mg in evening         . bimatoprost (LUMIGAN) 0.01 % SOLN   Both Eyes   Place 1 drop into both eyes at bedtime.          . brimonidine-timolol (COMBIGAN) 0.2-0.5 % ophthalmic solution   Both Eyes   Place 1 drop into both eyes every 12 (twelve) hours.          . brinzolamide (AZOPT) 1 % ophthalmic suspension   Both Eyes   Place 1 drop into both eyes 2 (two) times daily.          Marland Kitchen diltiazem (DILACOR XR) 240 MG 24 hr capsule   Oral   Take 1 capsule (240 mg total) by mouth daily.         Marland Kitchen  furosemide (LASIX) 20 MG tablet   Oral   Take 20 mg by mouth as needed.          . Glucosamine Sulfate-MSM (MSM/GLUCOSAMINE) CREA   Apply externally   Apply topically daily.         . Homeopathic Products (ARNICARE ARNICA) CREA   Apply externally   Apply topically daily.         Marland Kitchen HYDROcodone-acetaminophen (LORTAB) 10-500 MG per tablet   Oral   Take 1 tablet by mouth every 6 (six) hours as needed.           Marland Kitchen LORazepam (ATIVAN) 1 MG tablet   Oral   Take 1 mg by mouth every 6 (six) hours as needed.           Marland Kitchen MAGNESIUM PO   Oral   Take by mouth daily.         . meclizine (ANTIVERT) 25 MG tablet   Oral   Take 25 mg by mouth every 6 (six) hours as needed.           . Methylsulfonylmethane (MSM PO)   Oral   Take 300 mg by mouth daily.         . Multiple Minerals (MINERAL COMPLEX PO)   Oral   Take by mouth 3 (three) times daily.         Marland Kitchen OLIVE LEAF EXTRACT PO   Oral   Take 450 mg by mouth daily.         . Omega-3 Fatty Acids (OMEGA 3 PO)   Oral   Take 820  mg by mouth daily.         . Probiotic Product (PROBIOTIC DAILY PO)   Oral   Take by mouth daily.         . Turmeric 500 MG CAPS   Oral   Take 500 mg by mouth daily as needed.         . warfarin (COUMADIN) 2.5 MG tablet   Oral   Take 2.5 mg by mouth daily. Except Sunday          . warfarin (COUMADIN) 2.5 MG tablet   Oral   Take 3.75 mg by mouth every Sunday. Sundays only            There were no vitals taken for this visit.  Physical Exam  Nursing note and vitals reviewed. Constitutional: She is oriented to person, place, and time. She appears well-developed and well-nourished. No distress.  HENT:  Head: Normocephalic and atraumatic.  Eyes: Conjunctivae and EOM are normal. Pupils are equal, round, and reactive to light. Right eye exhibits no discharge. Left eye exhibits no discharge.  Periorbital tissues unremarkable. Conjunctiva clear. PERRL. Visual fields intact to confrontation. Attempted fundoscopy but inadequate views.   Neck: Neck supple.  Cardiovascular: Normal rate, regular rhythm and normal heart sounds.  Exam reveals no gallop and no friction rub.   No murmur heard. Pulmonary/Chest: Effort normal and breath sounds normal. No respiratory distress.  Abdominal: Soft. She exhibits no distension. There is no tenderness.  Musculoskeletal: She exhibits no edema and no tenderness.  Neurological: She is alert and oriented to person, place, and time. No cranial nerve deficit. She exhibits normal muscle tone. Coordination normal.  Speech clear. Content appropriate. Good finger to nose b/l.  Skin: Skin is warm and dry.  Psychiatric: She has a normal mood and affect. Her behavior is normal. Thought content normal.    ED Course  Procedures (  including critical care time)  Labs Reviewed  BASIC METABOLIC PANEL - Abnormal; Notable for the following:    Glucose, Bld 125 (*)    GFR calc non Af Amer 81 (*)    All other components within normal limits  PROTIME-INR -  Abnormal; Notable for the following:    Prothrombin Time 25.2 (*)    INR 2.42 (*)    All other components within normal limits  URINALYSIS, ROUTINE W REFLEX MICROSCOPIC - Abnormal; Notable for the following:    Specific Gravity, Urine 1.004 (*)    All other components within normal limits  CBC WITH DIFFERENTIAL  SEDIMENTATION RATE   Ct Head Wo Contrast  08/29/2012   *RADIOLOGY REPORT*  Clinical Data: Dizzy, weakness, palpitations  CT HEAD WITHOUT CONTRAST  Technique:  Contiguous axial images were obtained from the base of the skull through the vertex without contrast.  Comparison: None.  Findings:  There is mild diffuse primarily centralized volume loss with commiserate ex vacuo dilatation of the ventricular system. Bilateral basal ganglial calcifications.  Scattered minimal periventricular hypodensities compatible with microvascular ischemic disease.  Given background parenchymal abnormalities, there is no CT evidence of acute large territory infarct.  No intraparenchymal or extra-axial mass or hemorrhage.  No midline shift. Vascular calcifications within the bilateral carotid siphons.  Minimal polypoid mucosal thickening within the left sphenoid sinus. The remaining paranasal sinuses and mastoid air cells are normally aerated.  Post bilateral cataract surgery.  Regional soft tissues are normal.  No displaced calvarial fracture.  IMPRESSION:  1.  Atrophy and microvascular ischemic disease without acute intracranial process. 2.  Polypoid mucosal thickening within the left sphenoid sinus.  No air fluid levels.   Original Report Authenticated By: Jake Seats, MD    EKG:  Rhythm: sinus bradycardia Vent. rate 55 BPM PR interval 168 ms QRS duration 84 ms QT/QTc 440/420 ms Poor r wave progression ST segments: NS ST changes Comparison: TWI in III and r wave progresson poor new from previous ekg from 06/2009   1. Amaurosis fugax of right eye       MDM  80yF with painless R monocular vision  loss. Concern for TIA. Not TPA candidate given that well outside of window. Will admit for further eval.         Virgel Manifold, MD 08/29/12 1008  Virgel Manifold, MD 08/29/12 1013

## 2012-08-29 NOTE — H&P (Signed)
[facility]                                                                                    Patient Demographics  Jacqueline Nguyen, is a 79 y.o. female  CSN: [id number]  MRN: [id number]  DOB - Jan 07, 1932  Admit Date - 08/29/2012  Outpatient Primary MD for the patient is Asencion Gowda  Cardiologist Dr. Cathie Olden  With History of -  Past Medical History  Diagnosis Date  . Obesities, morbid   . Skin cancer   . Cataract   . Sleep apnea   . Osteoporosis   . Arthritis   . Vertigo   . Blood clotting disorder     In Lungs  . Endometriosis   . A-fib   . Anticoagulated on Coumadin       Past Surgical History  Procedure Laterality Date  . Oophorectomy    . Nose surgery    . Skin surgery    . Gallbladder surgery    . Uvulopalatopharyngoplasty    . Cataract surgery      in for   Chief Complaint  Patient presents with  . Weakness  . Palpitations     HPI  Jacqueline Nguyen  is a 79 y.o. female, with history of morbid obesity, atrial fibrillation on Coumadin, history of PE, chronic dizziness, obstructive sleep apnea on nighttime CPAP, who had right eye cataract surgery 7 days ago, transferred from [facility] ER.  Patient with above history with right eye cataract surgery 7 days ago experienced right eye vision loss 2 nights ago, she saw her eye surgeon the next day she was told that she should get a stroke workup and that this was unlikely to be related to her cataract surgery, she then presented [facility] where a head CT was unremarkable and then she was sent here for further workup, off note her right eye vision loss is almost completely gone, her vision is now much improved however it remains blurry. She denies any headache, no focal weakness, she takes Coumadin for atrial fibrillation and INR is therapeutic.    Review of Systems    In addition to the HPI above,   No Fever-chills, No Headache, No changes with hearing, R eye vision as  above No problems swallowing food or Liquids, No Chest pain, Cough or Shortness of Breath, No Abdominal pain, No Nausea or Vommitting, Bowel movements are regular, No Blood in stool or Urine, No dysuria, No new skin rashes or bruises, No new joints pains-aches,  No new weakness, tingling, numbness in any extremity, No recent weight gain or loss, No polyuria, polydypsia or polyphagia, No significant Mental Stressors.  A full 10 point Review of Systems was done, except as stated above, all other Review of Systems were negative.   Social History History  Substance Use Topics  . Smoking status: Former Research scientist (life sciences)  . Smokeless tobacco: Not on file  . Alcohol Use: No      Family History No CVA  Prior to Admission medications   Medication Sig Start Date End Date Taking? Authorizing Provider  albuterol (PROVENTIL HFA;VENTOLIN HFA) 108 (90 BASE) MCG/ACT  inhaler Inhale 2 puffs into the lungs every 6 (six) hours as needed for wheezing.   Yes Historical Provider, MD  Ascorbic Acid (VITAMIN C PO) Take 600 mg by mouth daily.   Yes Historical Provider, MD  atenolol (TENORMIN) 25 MG tablet Take 25 mg by mouth 2 (two) times daily.   Yes Historical Provider, MD  brimonidine-timolol (COMBIGAN) 0.2-0.5 % ophthalmic solution Place 1 drop into both eyes every 12 (twelve) hours.    Yes Historical Provider, MD  brinzolamide (AZOPT) 1 % ophthalmic suspension Place 1 drop into both eyes 2 (two) times daily.    Yes Historical Provider, MD  budesonide-formoterol (SYMBICORT) 160-4.5 MCG/ACT inhaler Inhale 2 puffs into the lungs 2 (two) times daily.   Yes Historical Provider, MD  diltiazem (DILACOR XR) 240 MG 24 hr capsule Take 240 mg by mouth daily. 02/05/12  Yes Thayer Headings, MD  fluticasone (FLOVENT HFA) 44 MCG/ACT inhaler Inhale 2 puffs into the lungs 2 (two) times daily.   Yes Historical Provider, MD  Homeopathic Products (ARNICARE ARNICA) CREA Apply topically daily.   Yes Historical Provider, MD   HYDROcodone-acetaminophen (NORCO) 7.5-325 MG per tablet Take 1 tablet by mouth every 4 (four) hours as needed for pain.   Yes Historical Provider, MD  levalbuterol Penne Lash) 0.63 MG/3ML nebulizer solution Take 1 ampule by nebulization every 8 (eight) hours as needed for wheezing.   Yes Historical Provider, MD  LORazepam (ATIVAN) 1 MG tablet Take 1 mg by mouth every 4 (four) hours as needed for anxiety.    Yes Historical Provider, MD  MAGNESIUM PO Take by mouth daily.   Yes Historical Provider, MD  meclizine (ANTIVERT) 25 MG tablet Take 25 mg by mouth every 6 (six) hours as needed.     Yes Historical Provider, MD  meloxicam (MOBIC) 7.5 MG tablet Take 7.5 mg by mouth daily.   Yes Historical Provider, MD  Methylsulfonylmethane (MSM PO) Take 300 mg by mouth daily.   Yes Historical Provider, MD  mometasone (NASONEX) 50 MCG/ACT nasal spray Place 2 sprays into the nose 2 (two) times daily.   Yes Historical Provider, MD  Multiple Minerals (MINERAL COMPLEX PO) Take 1 tablet by mouth daily.    Yes Historical Provider, MD  Nystatin ([place]) 100000 UNIT/GM POWD Apply topically.   Yes Historical Provider, MD  OLIVE LEAF EXTRACT PO Take 450 mg by mouth daily.   Yes Historical Provider, MD  Turmeric 500 MG CAPS Take 500 mg by mouth daily as needed.   Yes Historical Provider, MD  warfarin (COUMADIN) 2.5 MG tablet Take 2.5 mg by mouth daily. Except Sunday and Thursday takes 1 and 1/2 tablets . Patient states has home tester and target range is 2-3 if test is over 3 she will omit the extra 1/2 on these two days   Yes Historical Provider, MD    Allergies  Allergen Reactions  . Ciprofloxacin   . Bactrim Rash  . Codeine Nausea Only    Physical Exam  Vitals  Blood pressure 158/51, pulse 61, resp. rate 19, SpO2 99.00%.   1. General obese elderly female lying in bed in NAD,     2. Normal affect and insight, Not Suicidal or Homicidal, Awake Alert, Oriented X 3, R eye vision is blurred  3. No F.N deficits,  ALL C.Nerves Intact, Strength 5/5 all 4 extremities, Sensation intact all 4 extremities, Plantars down going.  4. Ears and Eyes appear Normal, Conjunctivae clear, PERRLA. Moist Oral Mucosa.  5. Supple Neck, No JVD,  No cervical lymphadenopathy appriciated, No Carotid Bruits.  6. Symmetrical Chest wall movement, Good air movement bilaterally, CTAB.  7. RRR, No Gallops, Rubs or Murmurs, No Parasternal Heave.  8. Positive Bowel Sounds, Abdomen Soft, Non tender, No organomegaly appriciated,No rebound -guarding or rigidity.  9.  No Cyanosis, Normal Skin Turgor, No Skin Rash or Bruise.  10. Good muscle tone,  joints appear normal , no effusions, Normal ROM.  11. No Palpable Lymph Nodes in Neck or Axillae     Data Review  CBC  Recent Labs Lab 08/29/12 0910  WBC 7.1  HGB 14.0  HCT 43.3  PLT 323  MCV 93.1  MCH 30.1  MCHC 32.3  RDW 14.3  LYMPHSABS 1.7  MONOABS 0.7  EOSABS 0.1  BASOSABS 0.0   ------------------------------------------------------------------------------------------------------------------  Chemistries   Recent Labs Lab 08/29/12 0910  NA 143  K 4.0  CL 103  CO2 31  GLUCOSE 125*  BUN 12  CREATININE 0.70  CALCIUM 9.7   ------------------------------------------------------------------------------------------------------------------ CrCl is unknown because both a height and weight (above a minimum accepted value) are required for this calculation. ------------------------------------------------------------------------------------------------------------------ No results found for this basename: TSH, T4TOTAL, FREET3, T3FREE, THYROIDAB,  in the last 72 hours   Coagulation profile  Recent Labs Lab 08/29/12 0910  INR 2.42*   ------------------------------------------------------------------------------------------------------------------- No results found for this basename: DDIMER,  in the last 72  hours -------------------------------------------------------------------------------------------------------------------  Cardiac Enzymes No results found for this basename: CK, CKMB, TROPONINI, MYOGLOBIN,  in the last 168 hours ------------------------------------------------------------------------------------------------------------------ No components found with this basename: POCBNP,    ---------------------------------------------------------------------------------------------------------------  Urinalysis    Component Value Date/Time   COLORURINE YELLOW 08/29/2012 0845   APPEARANCEUR CLEAR 08/29/2012 0845   LABSPEC 1.004* 08/29/2012 0845   PHURINE 7.0 08/29/2012 0845   GLUCOSEU NEGATIVE 08/29/2012 0845   HGBUR NEGATIVE 08/29/2012 0845   BILIRUBINUR NEGATIVE 08/29/2012 0845   KETONESUR NEGATIVE 08/29/2012 0845   PROTEINUR NEGATIVE 08/29/2012 0845   UROBILINOGEN 0.2 08/29/2012 0845   NITRITE NEGATIVE 08/29/2012 0845   LEUKOCYTESUR NEGATIVE 08/29/2012 0845    ----------------------------------------------------------------------------------------------------------------  Imaging results:   Ct Head Wo Contrast  08/29/2012   *RADIOLOGY REPORT*  Clinical Data: Dizzy, weakness, palpitations  CT HEAD WITHOUT CONTRAST  Technique:  Contiguous axial images were obtained from the base of the skull through the vertex without contrast.  Comparison: None.  Findings:  There is mild diffuse primarily centralized volume loss with commiserate ex vacuo dilatation of the ventricular system. Bilateral basal ganglial calcifications.  Scattered minimal periventricular hypodensities compatible with microvascular ischemic disease.  Given background parenchymal abnormalities, there is no CT evidence of acute large territory infarct.  No intraparenchymal or extra-axial mass or hemorrhage.  No midline shift. Vascular calcifications within the bilateral carotid siphons.  Minimal polypoid mucosal thickening within  the left sphenoid sinus. The remaining paranasal sinuses and mastoid air cells are normally aerated.  Post bilateral cataract surgery.  Regional soft tissues are normal.  No displaced calvarial fracture.  IMPRESSION:  1.  Atrophy and microvascular ischemic disease without acute intracranial process. 2.  Polypoid mucosal thickening within the left sphenoid sinus.  No air fluid levels.   Original Report Authenticated By: Jake Seats, MD    My personal review of EKG: Rhythm S.Brady, no Acute ST changes    Assessment & Plan    1. Right eye vision loss which is now improved, happened 2 nights ago, vision now only blurred, he said cataract surgery right eye, seen by her eye surgeon yesterday after  her symptoms started, patient was told to get an echogram and carotid duplex. This could be do to TIA/CVA, however complications from cataract surgery remain in the differential. She will be admitted to a telemetry bed, stroke workup will be initiated including MRI MRA brain, carotid duplex, echogram, lipid panel, A1c, she will be seen by PT OT and speech, bedside she exhibits no focal radical deficits or swallowing or speech problems. Her Coumadin will be continued, I have consulted neurology she will be seen shortly by them. If stroke workup is negative she will continue to follow with her eye surgeon post discharge.   2. Atrial fibrillation on Coumadin. Currently in sinus rhythm, she will monitor on telemetry, Coumadin will be continued under the guidance of pharmacy who will be monitoring her INR and Coumadin dose. Goal will be rate controlled her home dose rate control medications which include her beta blocker and Cardizem will be continued, note patient's cardiologist is Dr. Cathie Olden.   3. Morbid obesity with obstructive sleep apnea. Continue nighttime CPAP, patient requested to get home device, outpatient followup with PCP post discharge for obesity.   4. Chronic dizziness since her PE. This is a  chronic problem, would request PT and OT to eval, will monitor orthostatics.    5. History of oxygen use. Patient says that she does not have any COPD or CHF that she knows, she uses oxygen since her massive bilateral PE several years ago, possibly this could have caused some pulmonary infarction and chronic lung function loss, continue her on oxygen and applies the treatments as needed. Continue anticoagulation with Coumadin.    DVT Prophylaxis Coumadin  AM Labs Ordered, also please review Full Orders  Family Communication: Admission, patients condition and plan of care including tests being ordered have been discussed with the patient and aide who indicate understanding and agree with the plan and Code Status.  Code Status full  Likely DC to  home  Time spent in minutes : 35  Condition Jill Side K M.D on 08/29/2012 at 1:37 PM  Between 7am to 7pm - Pager - [phone]  After 7pm go to [email or web address] - password [email or web address]  And look for the night coverage person covering me after hours  [facility] Office  [phone]

## 2012-08-29 NOTE — ED Notes (Signed)
Attempted to call report to nurse on receiving unit. Nurse is not available at this time.

## 2012-08-29 NOTE — Progress Notes (Signed)
ANTICOAGULATION CONSULT NOTE - Initial Consult  Pharmacy Consult for Coumadin Indication: PAF, history of PE  Allergies  Allergen Reactions  . Ciprofloxacin   . Bactrim Rash  . Codeine Nausea Only   Vital Signs: BP: 158/51 mmHg (05/29 1153) Pulse Rate: 61 (05/29 1153)  Labs:  Recent Labs  08/29/12 0910  HGB 14.0  HCT 43.3  PLT 323  LABPROT 25.2*  INR 2.42*  CREATININE 0.70    The CrCl is unknown because both a height and weight (above a minimum accepted value) are required for this calculation.   Medical History: Past Medical History  Diagnosis Date  . Obesities, morbid   . Skin cancer   . Cataract   . Sleep apnea   . Osteoporosis   . Arthritis   . Vertigo   . Blood clotting disorder     In Lungs  . Endometriosis     Assessment: 79 year old female on Coumadin PTA for PAF.  Admitted today with multiple complaints including blurred vision and palpitations.  Admitted for concern for TIA  Admit INR therapeutic at 2.42  Dose PTA = 2.5 mg daily -- may take 1.5 tablets on Tuesday and Thursday depending on home INR  Goal of Therapy:  INR 2-3 Monitor platelets by anticoagulation protocol: Yes   Plan:  1) Coumadin 2.5 mg po x 1 dose at 1800 pm 2) Daily INR  Thank you. Anette Guarneri, PharmD [phone]  08/29/2012,1:33 PM

## 2012-08-29 NOTE — ED Notes (Signed)
Woke up feeling dizzy weak and feels like heart is beating faster than it should. Pt has history of a fib also has MD appt today at 245 states she was afraid she wouldn't be able to make it there

## 2012-08-29 NOTE — ED Notes (Addendum)
Pre-hospital treatment [facility]--Afib on monitor, CBG 101, 93% on RA and 98% on 2L, HR 60-68, RR 20, BP 139/68

## 2012-08-29 NOTE — ED Notes (Signed)
Pt sts she has appt for carotid dopplers and echo this afternoon and would like to get those done this morning.

## 2012-08-29 NOTE — Consult Note (Signed)
Referring Physician: Dr. Candiss Norse    Chief Complaint: R eye vision loss  HPI:  Jacqueline Nguyen is an 79 y.o. female with PMH of obesity, PAF on coumadin, OSA, b/l PE, and atrhritis presenting to the hospital today with complaints of sudden onset painless right eye vision loss 2 days prior.  She explains that she recently had b/l cataract surgery, L eye was in March 2014 and R eye was recently operated on 08/13/12. She complains of having no post op complications and was followed up appropriately with ophthalmologist, Dr. Prudencio Burly.  She was working on her computer Tuesday, 08/27/12 evening and shortly after when she went to sit down on the couch she had sudden onset black out of right eye with lightening like images in blue/purple color.  She was able to see from left eye.  She called her son who then called the emergency ophthalmologist.  She was apparently evaluated by the on call physician at that time and returned home.  She was then seen again in the ophthalmolgoist office yesterday and referred to PCP for further work up including echo and carotid dopplers. She regained some vision in right eye yesterday afternoon/evening after coming back home from ophthalomogist office.  However, this morning her vision remained blurry from the right eye and she also complained of dizziness and feeling like she was going to fall.   She denies any similar prior episodes, no pain in the right eye, no headaches, N/V/D, fever, chills, diplopia, dysphagia, photophobia, or any weakness more than usual.  She denies any falls or LOC.  She does have some occasional light-headedness when she strains in the bathroom usually.  The dizziness has since then resolved.   Of note, she endorse feeling like she was in Afib again this morning and took her atenolol and noticed improvement.    Date last known well: 08/27/12 Time last known well: approximately afternoon time tPA Given: No: out of window and resolving symptoms  Past Medical  History  Diagnosis Date  . Obesities, morbid   . Skin cancer   . Cataract   . Sleep apnea   . Osteoporosis   . Arthritis   . Vertigo   . Blood clotting disorder     In Lungs  . Endometriosis   . A-fib   . Anticoagulated on Coumadin    Past Surgical History  Procedure Laterality Date  . Oophorectomy    . Nose surgery    . Skin surgery    . Gallbladder surgery    . Uvulopalatopharyngoplasty    . Cataract surgery     Family history noted for Afib, blood clots, cancer, and heart problems.  Social History:  reports that she has quit smoking. She does not have any smokeless tobacco history on file. She reports that she does not drink alcohol or use illicit drugs.  Allergies:  Allergies  Allergen Reactions  . Ciprofloxacin   . Bactrim Rash  . Codeine Nausea Only   Medications:  Prescriptions prior to admission  Medication Sig Dispense Refill  . albuterol (PROVENTIL HFA;VENTOLIN HFA) 108 (90 BASE) MCG/ACT inhaler Inhale 2 puffs into the lungs every 6 (six) hours as needed for wheezing.      . Ascorbic Acid (VITAMIN C PO) Take 600 mg by mouth daily.      Marland Kitchen atenolol (TENORMIN) 25 MG tablet Take 25 mg by mouth 2 (two) times daily.      . brimonidine-timolol (COMBIGAN) 0.2-0.5 % ophthalmic solution Place 1 drop into both  eyes every 12 (twelve) hours.       . brinzolamide (AZOPT) 1 % ophthalmic suspension Place 1 drop into both eyes 2 (two) times daily.       . budesonide-formoterol (SYMBICORT) 160-4.5 MCG/ACT inhaler Inhale 2 puffs into the lungs 2 (two) times daily.      Marland Kitchen diltiazem (DILACOR XR) 240 MG 24 hr capsule Take 240 mg by mouth daily.      . fluticasone (FLOVENT HFA) 44 MCG/ACT inhaler Inhale 2 puffs into the lungs 2 (two) times daily.      . Homeopathic Products (ARNICARE ARNICA) CREA Apply topically daily.      Marland Kitchen HYDROcodone-acetaminophen (NORCO) 7.5-325 MG per tablet Take 1 tablet by mouth every 4 (four) hours as needed for pain.      Marland Kitchen levalbuterol (XOPENEX) 0.63  MG/3ML nebulizer solution Take 1 ampule by nebulization every 8 (eight) hours as needed for wheezing.      Marland Kitchen LORazepam (ATIVAN) 1 MG tablet Take 1 mg by mouth every 4 (four) hours as needed for anxiety.       Marland Kitchen MAGNESIUM PO Take by mouth daily.      . meclizine (ANTIVERT) 25 MG tablet Take 25 mg by mouth every 6 (six) hours as needed.        . meloxicam (MOBIC) 7.5 MG tablet Take 7.5 mg by mouth daily.      . Methylsulfonylmethane (MSM PO) Take 300 mg by mouth daily.      . mometasone (NASONEX) 50 MCG/ACT nasal spray Place 2 sprays into the nose 2 (two) times daily.      . Multiple Minerals (MINERAL COMPLEX PO) Take 1 tablet by mouth daily.       Marland Kitchen Nystatin ([place]) 100000 UNIT/GM POWD Apply topically.      Marland Kitchen OLIVE LEAF EXTRACT PO Take 450 mg by mouth daily.      . Turmeric 500 MG CAPS Take 500 mg by mouth daily as needed.      . warfarin (COUMADIN) 2.5 MG tablet Take 2.5 mg by mouth daily. Except Sunday and Thursday takes 1 and 1/2 tablets . Patient states has home tester and target range is 2-3 if test is over 3 she will omit the extra 1/2 on these two days       I have reviewed the patient's current medications.  History obtained from the patient  Review of Systems:  Constitutional:  Denies fever, chills, diaphoresis, appetite change and fatigue.   HEENT:  R eye vision loss and blurry vision.  Denies congestion, sore throat, rhinorrhea, sneezing, mouth sores, trouble swallowing, neck pain   Respiratory:  On [place] o2.  Denies SOB, DOE, cough, and wheezing.   Cardiovascular:  PAF on coumadin. Denies palpitations and leg swelling.   Gastrointestinal:  Denies nausea, vomiting, abdominal pain, diarrhea, constipation, blood in stool and abdominal distention.   Genitourinary:  Denies dysuria, urgency, frequency, hematuria, flank pain and difficulty urinating.   Musculoskeletal:  Chronic L arm and back pain and limited mobility.  Denies myalgias,  joint swelling.    Skin:  Denies pallor, rash and  wound.   Neurological:  Dizziness and light-headedness.  Denies seizures, syncope, weakness, numbness and headaches.    Neurologic Examination:  Blood pressure 158/51, pulse 61, resp. rate 19, SpO2 99.00%.  Mental Status: Alert, oriented, thought content appropriate.  Speech fluent without evidence of aphasia.  Able to follow 3 step commands without difficulty. Cranial Nerves: II: Discs flat bilaterally; Visual fields grossly normal, pupils  equal, round, reactive to light and accommodation.  Complaints of blurry vision with right eye, improved when eye lid held up.  III,IV, VI: right lid lag down to level of mid pupil, extra-ocular motions intact bilaterally, conjugation b/l present V,VII: smile symmetric, facial light touch sensation normal bilaterally VIII: hearing normal bilaterally IX,X: gag reflex not tested XI: bilateral shoulder shrug XII: midline tongue extension Motor: Right : Upper extremity   5/5    Left:     Upper extremity   4/5  Lower extremity   5/5     Lower extremity   4/5 Tone and bulk:normal tone throughout; no atrophy noted Sensory: Pinprick and light touch intact throughout, bilaterally Deep Tendon Reflexes: 2+ and symmetric throughout Plantars: Right: downgoing   Left: downgoing Cerebellar: B/l hand tremors noted with finger-to-nose, heel-to-shin test slower with LLE due to complaints of arthritis and pain with movement Gait: unable to assess, walks with support CV: pulses palpable throughout   Results for orders placed during the hospital encounter of 08/29/12 (from the past 48 hour(s))  URINALYSIS, ROUTINE W REFLEX MICROSCOPIC     Status: Abnormal   Collection Time    08/29/12  8:45 AM      Result Value Range   Color, Urine YELLOW  YELLOW   APPearance CLEAR  CLEAR   Specific Gravity, Urine 1.004 (*) 1.005 - 1.030   pH 7.0  5.0 - 8.0   Glucose, UA NEGATIVE  NEGATIVE mg/dL   Hgb urine dipstick NEGATIVE  NEGATIVE   Bilirubin Urine NEGATIVE  NEGATIVE    Ketones, ur NEGATIVE  NEGATIVE mg/dL   Protein, ur NEGATIVE  NEGATIVE mg/dL   Urobilinogen, UA 0.2  0.0 - 1.0 mg/dL   Nitrite NEGATIVE  NEGATIVE   Leukocytes, UA NEGATIVE  NEGATIVE   Comment: MICROSCOPIC NOT DONE ON URINES WITH NEGATIVE PROTEIN, BLOOD, LEUKOCYTES, NITRITE, OR GLUCOSE <1000 mg/dL.  CBC WITH DIFFERENTIAL     Status: None   Collection Time    08/29/12  9:10 AM      Result Value Range   WBC 7.1  4.0 - 10.5 K/uL   RBC 4.65  3.87 - 5.11 MIL/uL   Hemoglobin 14.0  12.0 - 15.0 g/dL   HCT 43.3  36.0 - 46.0 %   MCV 93.1  78.0 - 100.0 fL   MCH 30.1  26.0 - 34.0 pg   MCHC 32.3  30.0 - 36.0 g/dL   RDW 14.3  11.5 - 15.5 %   Platelets 323  150 - 400 K/uL   Neutrophils Relative % 64  43 - 77 %   Neutro Abs 4.5  1.7 - 7.7 K/uL   Lymphocytes Relative 25  12 - 46 %   Lymphs Abs 1.7  0.7 - 4.0 K/uL   Monocytes Relative 9  3 - 12 %   Monocytes Absolute 0.7  0.1 - 1.0 K/uL   Eosinophils Relative 2  0 - 5 %   Eosinophils Absolute 0.1  0.0 - 0.7 K/uL   Basophils Relative 0  0 - 1 %   Basophils Absolute 0.0  0.0 - 0.1 K/uL  BASIC METABOLIC PANEL     Status: Abnormal   Collection Time    08/29/12  9:10 AM      Result Value Range   Sodium 143  135 - 145 mEq/L   Potassium 4.0  3.5 - 5.1 mEq/L   Chloride 103  96 - 112 mEq/L   CO2 31  19 - 32 mEq/L  Glucose, Bld 125 (*) 70 - 99 mg/dL   BUN 12  6 - 23 mg/dL   Creatinine, Ser 0.70  0.50 - 1.10 mg/dL   Calcium 9.7  8.4 - 10.5 mg/dL   GFR calc non Af Amer 81 (*) >90 mL/min   GFR calc Af Amer >90  >90 mL/min   Comment:            The eGFR has been calculated     using the CKD EPI equation.     This calculation has not been     validated in all clinical     situations.     eGFR's persistently     <90 mL/min signify     possible Chronic Kidney Disease.  PROTIME-INR     Status: Abnormal   Collection Time    08/29/12  9:10 AM      Result Value Range   Prothrombin Time 25.2 (*) 11.6 - 15.2 seconds   INR 2.42 (*) 0.00 - 1.49   SEDIMENTATION RATE     Status: Abnormal   Collection Time    08/29/12  9:10 AM      Result Value Range   Sed Rate 24 (*) 0 - 22 mm/hr   Ct Head Wo Contrast  08/29/2012   *RADIOLOGY REPORT*  Clinical Data: Dizzy, weakness, palpitations  CT HEAD WITHOUT CONTRAST  Technique:  Contiguous axial images were obtained from the base of the skull through the vertex without contrast.  Comparison: None.  Findings:  There is mild diffuse primarily centralized volume loss with commiserate ex vacuo dilatation of the ventricular system. Bilateral basal ganglial calcifications.  Scattered minimal periventricular hypodensities compatible with microvascular ischemic disease.  Given background parenchymal abnormalities, there is no CT evidence of acute large territory infarct.  No intraparenchymal or extra-axial mass or hemorrhage.  No midline shift. Vascular calcifications within the bilateral carotid siphons.  Minimal polypoid mucosal thickening within the left sphenoid sinus. The remaining paranasal sinuses and mastoid air cells are normally aerated.  Post bilateral cataract surgery.  Regional soft tissues are normal.  No displaced calvarial fracture.  IMPRESSION:  1.  Atrophy and microvascular ischemic disease without acute intracranial process. 2.  Polypoid mucosal thickening within the left sphenoid sinus.  No air fluid levels.   Original Report Authenticated By: Jake Seats, MD    Assessment and plan discussed with with Dr. Leonel Ramsay and they are in agreement.    Signed: Jerene Pitch, MD PGY-I, Internal Medicine Resident Pager: [phone]  08/29/2012,4:18 PM   Assessment: 79 y.o. female with PMH of arthritis, PAF on coumadin, b/l PEs, s/p recent b/l cataract removal, and obesity admitted for right sided painless sudden vision loss x2 days. Symptoms have gradually improved with minimal blurry vision out of right eye by the time of admission.  Suspect possible central or branch retinal artery occlusion with  resolution  Stroke Risk Factors - atrial fibrillation, hypertension and hx of b/l PE  Plan: 1. HgbA1c, fasting lipid panel 2. MRI, MRA  of the brain without contrast 3. PT consult, OT consult, Speech consult 4. Echocardiogram 5. Carotid dopplers 6. Prophylactic therapy-Anticoagulation: Continue Coumadin for now 7. Risk factor modification 8. Telemetry monitoring 9. Frequent neuro checks  I have seen and evaluated the patient. I have reviewed the above note and agree with the above.  79 yo F with transient vision loss that had improved by the time of her visit to Dr. Prudencio Burly the morning after onset. He had a normal  opthamalogical exam accounting for her recent surgery, and felt that this was most likely amurosis fugax.  She is clearly able to state that this is unilateral vision loss, not field loss as she covered the eye.   Her transient epsiode of dizziness today is less clear to me that it is vascular in nature, could have been orthostasis as it improved on sitting.   She has no APD and is able to read from her right eye.   Roland Rack, MD [facility] [phone]  If 7pm- 7am, please page neurology on call at [phone].

## 2012-08-29 NOTE — Progress Notes (Signed)
  Echocardiogram 2D Echocardiogram has been performed.  Jacqueline Nguyen 08/29/2012, 5:41 PM

## 2012-08-29 NOTE — Progress Notes (Signed)
Patient has home cpap unit.  Rt assisted with filling up water container and hooking up oxygen tubing.  Patient tolerating well at this time.

## 2012-08-29 NOTE — ED Notes (Signed)
Called report to receiving nurse Alfonse Alpers, RN

## 2012-08-30 ENCOUNTER — Observation Stay (HOSPITAL_COMMUNITY): Payer: Medicare Other

## 2012-08-30 ENCOUNTER — Inpatient Hospital Stay (HOSPITAL_COMMUNITY): Payer: Medicare Other

## 2012-08-30 LAB — HEMOGLOBIN A1C
Hgb A1c MFr Bld: 5.8 % — ABNORMAL HIGH (ref <5.7)
Mean Plasma Glucose: 120 mg/dL — ABNORMAL HIGH (ref <117)

## 2012-08-30 LAB — PROTIME-INR
INR: 2.35 — ABNORMAL HIGH (ref 0.00–1.49)
Prothrombin Time: 24.7 seconds — ABNORMAL HIGH (ref 11.6–15.2)

## 2012-08-30 LAB — LIPID PANEL
HDL: 55 mg/dL (ref >39)
LDL Cholesterol: 130 mg/dL — ABNORMAL HIGH (ref 0–99)

## 2012-08-30 MED ORDER — WARFARIN SODIUM 2.5 MG PO TABS
2.5000 mg | ORAL_TABLET | Freq: Every day | ORAL | Status: DC
  Administered 2012-08-30: 2.5 mg via ORAL
  Filled 2012-08-30 (×2): qty 1

## 2012-08-30 NOTE — Evaluation (Signed)
Occupational Therapy Evaluation Patient Details Name: Jacqueline Nguyen MRN: [id number] DOB: 10-16-1931 Today's Date: 08/30/2012 Time: 1545-1600 OT Time Calculation (min): 15 min  OT Assessment / Plan / Recommendation Clinical Impression  Pt admitted with recent cataract surgery (08/13/12) adm. for vision loss right eye thought to be thrombotic issue. Pt reports vision is much improved today and is presenting at baseline with ADLs.  No further acute OT needs.     OT Assessment  Patient does not need any further OT services    Follow Up Recommendations  No OT follow up;Supervision - Intermittent    Barriers to Discharge      Equipment Recommendations  None recommended by OT    Recommendations for Other Services    Frequency       Precautions / Restrictions Precautions Precautions: Fall Precaution Comments: O2 dependent, one fall about a year ago Restrictions Weight Bearing Restrictions: No   Pertinent Vitals/Pain See vitals    ADL  Eating/Feeding: Performed;Independent Where Assessed - Eating/Feeding: Chair Grooming: Performed;Wash/dry hands;Supervision/safety Where Assessed - Grooming: Unsupported standing Upper Body Bathing: Simulated;Set up Where Assessed - Upper Body Bathing: Unsupported sitting Lower Body Bathing: Simulated;Supervision/safety Where Assessed - Lower Body Bathing: Unsupported sit to stand Upper Body Dressing: Simulated;Set up Where Assessed - Upper Body Dressing: Unsupported sitting Lower Body Dressing: Simulated;Min guard Where Assessed - Lower Body Dressing: Supported sit to stand Toilet Transfer: Chartered loss adjuster Method:  (ambulating) Science writer: Comfort height toilet Toileting - Water quality scientist and Hygiene: Performed;Supervision/safety Where Assessed - Best boy and Hygiene: Sit to stand from 3-in-1 or toilet Equipment Used:  (rollater) Transfers/Ambulation Related to ADLs:  Supervision ambulating with rollator. ADL Comments: Pt at baseline with ADLs.      OT Diagnosis:    OT Problem List:   OT Treatment Interventions:     OT Goals    Visit Information  Last OT Received On: 08/30/12 Assistance Needed: +1    Subjective Data      Prior Functioning     Home Living Lives With: Alone Available Help at Discharge: Family;Personal care attendant (attendant M-F 9-2) Type of Home: House Home Access: Level entry Home Layout: One level Bathroom Shower/Tub: Tub/shower unit (handicap accessible) Bathroom Toilet: Standard Home Adaptive Equipment: Environmental consultant - four wheeled Additional Comments: handles on her toilet? Prior Function Level of Independence: Needs assistance Needs Assistance: Bathing;Meal Prep;Light Housekeeping Bath: Minimal Meal Prep: Moderate Light Housekeeping: Moderate Able to Take Stairs?: No Driving: No Vocation: Retired Comments: does some laundry and can fix meal (already prepared) and do some dishes, on the weekend; several sons who check in on her throughout the day; alone on the weekend Communication Communication: No difficulties         Vision/Perception Vision - History Visual History: Cataracts Patient Visual Report: No change from baseline Vision - Assessment Additional Comments: Vision [facility] for tasks assessed. Pt reports having "flashing spots" in right eye last night but today vision is much improved.    Cognition  Cognition Arousal/Alertness: Awake/alert Behavior During Therapy: WFL for tasks assessed/performed Overall Cognitive Status: Within Functional Limits for tasks assessed    Extremity/Trunk Assessment Right Upper Extremity Assessment RUE ROM/Strength/Tone: [facility] for tasks assessed Left Upper Extremity Assessment LUE ROM/Strength/Tone: WFL for tasks assessed Right Lower Extremity Assessment RLE ROM/Strength/Tone: [facility] for tasks assessed Left Lower Extremity Assessment LLE ROM/Strength/Tone: WFL for tasks  assessed     Mobility Bed Mobility Bed Mobility: Not assessed Transfers Transfers: Sit to Stand;Stand to Sit Sit to Stand: 5:  Supervision;From chair/3-in-1;From toilet Stand to Sit: 5: Supervision;To chair/3-in-1;To toilet Details for Transfer Assistance: good safety sit<>stand from chair, from toilet needing cues for locking rollator prior to standing     Exercise     Balance     End of Session OT - End of Session Equipment Utilized During Treatment: Gait belt Activity Tolerance: Patient tolerated treatment well Patient left: in chair;with call bell/phone within reach  GO Functional Assessment Tool Used: clinical judgement Functional Limitation: Self care Self Care Current Status (V6122): At least 1 percent but less than 20 percent impaired, limited or restricted Self Care Goal Status (E4975): At least 1 percent but less than 20 percent impaired, limited or restricted Self Care Discharge Status [phone]): At least 1 percent but less than 20 percent impaired, limited or restricted   08/30/2012 Darrol Jump OTR/L Pager [phone] Office [phone]  Darrol Jump 08/30/2012, 5:10 PM

## 2012-08-30 NOTE — Progress Notes (Signed)
ANTICOAGULATION CONSULT NOTE - Initial Consult  Pharmacy Consult for Coumadin Indication: PAF, history of PE  Allergies  Allergen Reactions  . Ciprofloxacin   . Bactrim Rash  . Codeine Nausea Only   Vital Signs: Temp: 98 F (36.7 C) (05/30 0430) Temp src: Oral (05/30 0430) BP: 162/82 mmHg (05/30 0430) Pulse Rate: 76 (05/30 0430)  Labs:  Recent Labs  08/29/12 0910 08/30/12 0550  HGB 14.0  --   HCT 43.3  --   PLT 323  --   LABPROT 25.2* 24.7*  INR 2.42* 2.35*  CREATININE 0.70  --     Estimated Creatinine Clearance: 78.3 ml/min (by C-G formula based on Cr of 0.7).   Medical History: Past Medical History  Diagnosis Date  . Obesities, morbid   . Skin cancer   . Cataract   . Sleep apnea   . Osteoporosis   . Arthritis   . Vertigo   . Blood clotting disorder     In Lungs  . Endometriosis   . A-fib   . Anticoagulated on Coumadin     Assessment: 79 year old female on Coumadin PTA for PAF.  Admitted today with multiple complaints including blurred vision and palpitations.  Admitted for concern for TIA  INR therapeutic today.  Dose PTA = 2.5 mg daily -- may take 1.5 tablets on Tuesday and Thursday depending on home INR  Goal of Therapy:  INR 2-3 Monitor platelets by anticoagulation protocol: Yes   Plan:   Coumadin 2.57m PO qday Daily INR

## 2012-08-30 NOTE — Progress Notes (Signed)
VASCULAR LAB PRELIMINARY  PRELIMINARY  PRELIMINARY  PRELIMINARY  Carotid duplex completed.    Preliminary report:  Bilateral:  No evidence of hemodynamically significant internal carotid artery stenosis.   Vertebral artery flow is antegrade.     Mccauley Diehl, RVS 08/30/2012, 9:25 AM

## 2012-08-30 NOTE — Evaluation (Signed)
Physical Therapy Evaluation Patient Details Name: Jacqueline Nguyen MRN: [id number] DOB: December 26, 1931 Today's Date: 08/30/2012 Time: 7829-5621 PT Time Calculation (min): 31 min  PT Assessment / Plan / Recommendation Clinical Impression  79 y/o female with recent cataract surgery adm. for vision loss right eye thought to be thrombotic issue. Patient reports improved vision today. Denies any further weakness. Presents to PT with baseline generalized and limited activity tolerance putting her at risk for falls. She would benefit from HHPT for full balance work up and exercise program, case management notified. No further acute PT needs at this time.   Patient will have good family support on d/c and is anxious to return home.    PT Assessment  All further PT needs can be met in the next venue of care    Follow Up Recommendations  Home health PT;Supervision - Intermittent    Does the patient have the potential to tolerate intense rehabilitation      Barriers to Discharge        Equipment Recommendations  None recommended by PT    Recommendations for Other Services     Frequency      Precautions / Restrictions Precautions Precautions: Fall Precaution Comments: O2 dependent, one fall about a year ago Restrictions Weight Bearing Restrictions: No   Pertinent Vitals/Pain Denies pain, needed 3 liters of O2 for ambulation       Mobility  Transfers Transfers: Sit to Stand;Stand to Sit Sit to Stand: 5: Supervision;From chair/3-in-1 Stand to Sit: 5: Supervision;To chair/3-in-1 Details for Transfer Assistance: good safety sit<>stand from chair, from toilet needing cues for locking rollator prior to standing Ambulation/Gait Ambulation/Gait Assistance: 6: Modified independent (Device/Increase time) Ambulation Distance (Feet): 60 Feet Assistive device: 4-wheeled walker Gait Pattern: Step-through pattern;Trunk flexed;Decreased stride length Gait velocity: decreased General Gait Details:  decreased step height Stairs: No         PT Diagnosis: Difficulty walking;Generalized weakness  PT Problem List: Decreased strength;Decreased activity tolerance;Decreased balance PT Treatment Interventions:        Visit Information  Last PT Received On: 08/30/12 Assistance Needed: +1    Subjective Data  Subjective: I just can't stay here one more night. I just can't get any rest.  Patient Stated Goal: home today   Prior Functioning  Home Living Lives With: Alone Available Help at Discharge: Family;Personal care attendant (attendant M-F 9-2) Type of Home: House Home Access: Level entry Home Layout: One level Home Adaptive Equipment: Walker - four wheeled Additional Comments: handles on her toilet? Prior Function Level of Independence: Needs assistance Needs Assistance: Bathing;Meal Prep;Light Housekeeping Bath: Minimal Meal Prep: Moderate Light Housekeeping: Moderate Able to Take Stairs?: No Driving: No Vocation: Retired Comments: does some laundry and can fix meal (already prepared) and do some dishes, on the weekend; several sons who check in on her throughout the day; alone on the weekend Communication Communication: No difficulties    Cognition  Cognition Arousal/Alertness: Awake/alert Behavior During Therapy: WFL for tasks assessed/performed Overall Cognitive Status: Within Functional Limits for tasks assessed    Extremity/Trunk Assessment Right Upper Extremity Assessment RUE ROM/Strength/Tone: [facility] for tasks assessed Left Upper Extremity Assessment LUE ROM/Strength/Tone: [facility] for tasks assessed Right Lower Extremity Assessment RLE ROM/Strength/Tone: [facility] for tasks assessed Left Lower Extremity Assessment LLE ROM/Strength/Tone: [facility] for tasks assessed   Balance    End of Session PT - End of Session Equipment Utilized During Treatment: Gait belt;Oxygen Activity Tolerance: Patient tolerated treatment well Patient left: in chair;with call bell/phone within  reach Nurse Communication:  Mobility status  GP Functional Limitation: Mobility: Walking and moving around Mobility: Walking and Moving Around Current Status [phone]): At least 1 percent but less than 20 percent impaired, limited or restricted Mobility: Walking and Moving Around Goal Status [phone]): 0 percent impaired, limited or restricted Mobility: Walking and Moving Around Discharge Status [phone]): At least 1 percent but less than 20 percent impaired, limited or restricted   [place] 08/30/2012, 3:46 PM

## 2012-08-30 NOTE — Progress Notes (Signed)
Stroke Team Progress Note  HISTORY Jacqueline Nguyen is an 79 y.o. female with PMH of obesity, PAF on coumadin, OSA, b/l PE, and atrhritis presenting to the hospital today with complaints of sudden onset painless right eye vision loss 2 days prior. She explains that she recently had b/l cataract surgery, L eye was in March 2014 and R eye was recently operated on 08/13/12. She complains of having no post op complications and was followed up appropriately with ophthalmologist, Dr. Prudencio Burly. She was working on her computer Tuesday, 08/27/12 evening and shortly after when she went to sit down on the couch she had sudden onset black out of right eye with lightening like images in blue/purple color. She was able to see from left eye. She called her son who then called the emergency ophthalmologist. She was apparently evaluated by the on call physician at that time and returned home. She was then seen again in the ophthalmolgoist office yesterday and referred to PCP for further work up including echo and carotid dopplers. She regained some vision in right eye yesterday afternoon/evening after coming back home from ophthalomogist office. However, t her vision remained blurry from the right eye and she also complained of dizziness and feeling like she was going to fall.  She denies any similar prior episodes, no pain in the right eye, no headaches, N/V/D, fever, chills, diplopia, dysphagia, photophobia, or any weakness more than usual. She denies any falls or LOC. She does have some occasional light-headedness when she strains in the bathroom usually. The dizziness has since then resolved.  Of note, she endorse feeling like she was in Afib again this morning and took her atenolol and noticed improvement.    Date last known well: 08/27/12  Time last known well: approximately afternoon time  tPA Given: No: out of window    SUBJECTIVE Her family is at bedside. She can now see better out of right eye however really only can see  colors at this point. She continues to deny any headaches and did have about 4 menstrual migraines when she was younger but none presented like this episode.  OBJECTIVE Most recent Vital Signs: Filed Vitals:   08/29/12 2258 08/30/12 0112 08/30/12 0430 08/30/12 1409  BP: 122/41 138/47 162/82 138/59  Pulse: 57 52 76 68  Temp: 98.3 F (36.8 C)  98 F (36.7 C) 97.9 F (36.6 C)  TempSrc: Oral  Oral Oral  Resp: _0 Height:      Weight:      SpO2: 95% 97% 98% 95%   CBG (last 3)  No results found for this basename: GLUCAP,  in the last 72 hours  IV Fluid Intake:     MEDICATIONS  . atenolol  25 mg Oral BID  . brinzolamide  1 drop Both Eyes BID  . budesonide-formoterol  2 puff Inhalation BID  . diltiazem  240 mg Oral Daily  . fluticasone  2 puff Inhalation BID  . meloxicam  7.5 mg Oral Daily  . timolol  1 drop Both Eyes BID  . warfarin  2.5 mg Oral q1800  . Warfarin - Pharmacist Dosing Inpatient   Does not apply q1800   PRN:  HYDROcodone-acetaminophen, LORazepam, senna-docusate, sodium phosphate `1 Diet:  Cardiac thin liquids Activity:  Out of bed with assist DVT Prophylaxis:  Therapeutic INR on coumadin  CLINICALLY SIGNIFICANT STUDIES Basic Metabolic Panel:  Recent Labs Lab 08/29/12 0910  NA 143  K 4.0  CL 103  CO2 31  GLUCOSE 125*  BUN 12  CREATININE 0.70  CALCIUM 9.7   Liver Function Tests: No results found for this basename: AST, ALT, ALKPHOS, BILITOT, PROT, ALBUMIN,  in the last 168 hours CBC:  Recent Labs Lab 08/29/12 0910  WBC 7.1  NEUTROABS 4.5  HGB 14.0  HCT 43.3  MCV 93.1  PLT 323   Coagulation:  Recent Labs Lab 08/29/12 0910 08/30/12 0550  LABPROT 25.2* 24.7*  INR 2.42* 2.35*   Cardiac Enzymes: No results found for this basename: CKTOTAL, CKMB, CKMBINDEX, TROPONINI,  in the last 168 hours Urinalysis:  Recent Labs Lab 08/29/12 0845  COLORURINE YELLOW  LABSPEC 1.004*  PHURINE 7.0  GLUCOSEU NEGATIVE  HGBUR NEGATIVE  BILIRUBINUR  NEGATIVE  KETONESUR NEGATIVE  PROTEINUR NEGATIVE  UROBILINOGEN 0.2  NITRITE NEGATIVE  LEUKOCYTESUR NEGATIVE   Lipid Panel    Component Value Date/Time   CHOL 205* 08/30/2012 0550   TRIG 102 08/30/2012 0550   HDL 55 08/30/2012 0550   CHOLHDL 3.7 08/30/2012 0550   VLDL 20 08/30/2012 0550   LDLCALC 130* 08/30/2012 0550   HgbA1C  Lab Results  Component Value Date   HGBA1C 5.8* 08/30/2012    Urine Drug Screen:   No results found for this basename: labopia, cocainscrnur, labbenz, amphetmu, thcu, labbarb    Alcohol Level: No results found for this basename: ETH,  in the last 168 hours  Ct Head Wo Contrast 08/29/2012  1.  Atrophy and microvascular ischemic disease without acute intracranial process. 2.  Polypoid mucosal thickening within the left sphenoid sinus.  No air fluid levels.     Mr Brain Wo Contrast 08/30/2012   Generalized atrophy without acute abnormality.     MRA of the brain    2D Echocardiogram EF 60% no cardiac source of embolism   Carotid Doppler Bilateral: No evidence of hemodynamically significant internal carotid artery stenosis. Vertebral artery flow is antegrade  CXR    EKG  sinus bradycardia.   Therapy Recommendations   Physical Exam   Mental Status:  Alert, oriented, thought content appropriate. Speech fluent without evidence of aphasia. Able to follow 3 step commands without difficulty.  Cranial Nerves:  II: Discs flat bilaterally; Visual fields grossly normal, pupils equal, round, reactive to light and accommodation. Complaints of blurry vision with right eye, improved when eye lid held up. Able to count fingers well and even tell time on clock from right eye. III,IV, VI: right lid lag down to level of mid pupil, extra-ocular motions intact bilaterally, conjugation b/l present  V,VII: smile symmetric, facial light touch sensation normal bilaterally  VIII: hearing normal bilaterally  IX,X: gag reflex not tested  XI: bilateral shoulder shrug  XII: midline  tongue extension  Motor:  Right : Upper extremity 5/5 Left: Upper extremity 4/5  Lower extremity 5/5 Lower extremity 4/5  Tone and bulk:normal tone throughout; no atrophy noted  Sensory: Pinprick and light touch intact throughout, bilaterally  Deep Tendon Reflexes: 2+ and symmetric throughout  Plantars:  Right: downgoing Left: downgoing  Cerebellar:  B/l hand tremors noted with finger-to-nose, heel-to-shin test slower with LLE due to complaints of arthritis and pain with movement  Gait: unable to assess, walks with support  CV: pulses palpable throughout   ASSESSMENT Ms. Noura Purpura is a 79 y.o. female presenting with right sided painless sudden vision loss over a 2 day period time which has improved but not normalized. She denies any headache. At this point she can see colors .Brain  Imaging confirms no acute  infarct.  Retina lInfarct felt to be   secondary to retinal artery branch occlusion from embolism from atrial fibrillation.  On warfarin prior to admission. Now on warfarin for secondary stroke prevention. Patient with resultant right vision loss. Work up underway.   Morbid obesity  Atrial fibrillation  Pulmonary embolism history  Former smoker, continue cessation counseling  Hyperlipidemia, LDL 130  Long term high risk medication therapy with coumadin  Hospital day # 1  TREATMENT/PLAN  Continue warfarin for secondary stroke prevention.  Consider changing to xarelto due to coumadin failure. She has an appt with Dr. Acie Fredrickson and would like to discuss this with him.  LDL goal < 100 in non-diabetics, < 70 in diabetics. She is worried about starting medications for this as she has heard that they affect muscles. Again, she would like to discuss this with Dr. Acie Fredrickson. Family tells me that she has appt with him in 2 weeks.  Risk factor modification  Therapy recommendations pending.   Regenia Skeeter. Owens Shark, [facility], [place], [facility] Pager: [phone] 08/30/2012 2:48  PM  I have personally obtained a history, examined the patient, evaluated imaging results, and formulated the assessment and plan of care. I agree with the above.  Antony Contras, MD

## 2012-08-30 NOTE — Progress Notes (Addendum)
[facility] PROGRESS NOTE  Jacqueline Nguyen KDX:[id number] DOB: 1931-07-01 DOA: 08/29/2012 PCP: Asencion Gowda  Assessment/Plan: 1. Right eye vision loss: -Currently stable -2D echo, MRI brain, carotid dopplers unremarkalbe -Neuro following -Had been on coumadin -recs for transitioning to Xarelto secondary to coumadin failure  2. Atrial fibrillation: -Recs for xarelto after failing coumadin -Currently rate controlled  3. Morbid obesity with obstructive sleep apnea. -Continue nighttime CPAP  4. Chronic dizziness since her PE. -This is a chronic problem -PT/OT  5. History of oxygen use. Patient says that she does not have any COPD or CHF that she knows, she uses oxygen since her massive bilateral PE several years ago, possibly this could have caused some pulmonary infarction and chronic lung function loss, continue her on oxygen and applies the treatments as needed. Continue anticoagulation with Xarelto  Code Status: Full Family Communication: Pt and family in room (indicate person spoken with, relationship, and if by phone, the number) Disposition Plan: Pending   Consultants:  Neurology  Procedures:  Carotid dopplers on 08/30/12: no significant stenosis  2D echo on 08/30/12:  -Left ventricle: The cavity size was normal. Wall thickness was increased in a pattern of mild LVH. The estimated ejection fraction was 60%. Regional wall motion abnormalities cannot be excluded. - Aortic valve: Sclerosis without stenosis. No significant regurgitation. - Mitral valve: Moderately calcified annulus. - Left atrium: The atrium was moderately dilated. - Right ventricle: Not able to assess RV function due to poor visualization. Not able to assess RV size due to poor visualization. Doppler data suggests that RV function may be OK. - Right atrium: The atrium was mildly dilated.    HPI/Subjective: Pt is without complaints.  Objective: Filed Vitals:   08/29/12 2258 08/30/12 0112  08/30/12 0430 08/30/12 1409  BP: 122/41 138/47 162/82 138/59  Pulse: 57 52 76 68  Temp: 98.3 F (36.8 C)  98 F (36.7 C) 97.9 F (36.6 C)  TempSrc: Oral  Oral Oral  Resp: _0 Height:      Weight:      SpO2: 95% 97% 98% 95%    Intake/Output Summary (Last 24 hours) at 08/30/12 1646 Last data filed at 08/30/12 1600  Gross per 24 hour  Intake    120 ml  Output      0 ml  Net    120 ml   Filed Weights   08/29/12 2120  Weight: 125.1 kg (275 lb 12.7 oz)    Exam:   General:  Awake, in NAD  Cardiovascular: Regular, S1, S2  Respiratory: Normal resp effort  Abdomen: soft, nondistended, pos bowel sounds  Musculoskeletal: perfused, no cyanosis   Data Reviewed: Basic Metabolic Panel:  Recent Labs Lab 08/29/12 0910  NA 143  K 4.0  CL 103  CO2 31  GLUCOSE 125*  BUN 12  CREATININE 0.70  CALCIUM 9.7   Liver Function Tests: No results found for this basename: AST, ALT, ALKPHOS, BILITOT, PROT, ALBUMIN,  in the last 168 hours No results found for this basename: LIPASE, AMYLASE,  in the last 168 hours No results found for this basename: AMMONIA,  in the last 168 hours CBC:  Recent Labs Lab 08/29/12 0910  WBC 7.1  NEUTROABS 4.5  HGB 14.0  HCT 43.3  MCV 93.1  PLT 323   Cardiac Enzymes: No results found for this basename: CKTOTAL, CKMB, CKMBINDEX, TROPONINI,  in the last 168 hours BNP (last 3 results) No results found for this basename: PROBNP,  in  the last 8760 hours CBG: No results found for this basename: GLUCAP,  in the last 168 hours  No results found for this or any previous visit (from the past 240 hour(s)).   Studies: Ct Head Wo Contrast  08/29/2012   *RADIOLOGY REPORT*  Clinical Data: Dizzy, weakness, palpitations  CT HEAD WITHOUT CONTRAST  Technique:  Contiguous axial images were obtained from the base of the skull through the vertex without contrast.  Comparison: None.  Findings:  There is mild diffuse primarily centralized volume loss with  commiserate ex vacuo dilatation of the ventricular system. Bilateral basal ganglial calcifications.  Scattered minimal periventricular hypodensities compatible with microvascular ischemic disease.  Given background parenchymal abnormalities, there is no CT evidence of acute large territory infarct.  No intraparenchymal or extra-axial mass or hemorrhage.  No midline shift. Vascular calcifications within the bilateral carotid siphons.  Minimal polypoid mucosal thickening within the left sphenoid sinus. The remaining paranasal sinuses and mastoid air cells are normally aerated.  Post bilateral cataract surgery.  Regional soft tissues are normal.  No displaced calvarial fracture.  IMPRESSION:  1.  Atrophy and microvascular ischemic disease without acute intracranial process. 2.  Polypoid mucosal thickening within the left sphenoid sinus.  No air fluid levels.   Original Report Authenticated By: Jake Seats, MD   Mr Brain Wo Contrast  08/30/2012   *RADIOLOGY REPORT*  Clinical Data: Stroke.  Atrial fibrillation on Coumadin.  Right eye vision loss 2 days ago.  MRI HEAD WITHOUT CONTRAST  Technique:  Multiplanar, multiecho pulse sequences of the brain and surrounding structures were obtained according to standard protocol without intravenous contrast.  Comparison: CT head 08/29/2012  Findings: Incomplete study.  Axial T2 and diffusion weighted imaging were successfully performed.  The patient had symptoms of burning pain in the arms and the scan was terminated.  Generalized atrophy.  Ventricular enlargement is stable most likely due to atrophy.  Negative for acute infarction.  No significant chronic ischemia. Brainstem and cerebellum are intact.  Negative for mass or edema.  Paranasal sinuses are clear.  IMPRESSION: Generalized atrophy without acute abnormality.   Original Report Authenticated By: Carl Best, M.D.    Scheduled Meds: . atenolol  25 mg Oral BID  . brinzolamide  1 drop Both Eyes BID  .  budesonide-formoterol  2 puff Inhalation BID  . diltiazem  240 mg Oral Daily  . fluticasone  2 puff Inhalation BID  . meloxicam  7.5 mg Oral Daily  . timolol  1 drop Both Eyes BID  . warfarin  2.5 mg Oral q1800  . Warfarin - Pharmacist Dosing Inpatient   Does not apply q1800   Continuous Infusions:   Principal Problem:   Blurred vision, right eye Active Problems:   PAF (paroxysmal atrial fibrillation)   Blood clotting disorder   Cataract   Sleep apnea   A-fib   Anticoagulated on Coumadin    Time spent: 5mn    Mihcael Ledee, [facility] Pager [phone] If 7PM-7AM, please contact night-coverage at [email or web address], password [email or web address]5/30/2014, 4:46 PM  LOS: 1 day

## 2012-08-30 NOTE — Evaluation (Signed)
Speech Language Pathology Evaluation Patient Details Name: Jacqueline Nguyen MRN: [id number] DOB: Oct 12, 1931 Today's Date: 08/30/2012 Time: 3664-4034 SLP Time Calculation (min): 13 min  Problem List:  Patient Active Problem List   Diagnosis Date Noted  . Blurred vision, right eye 08/29/2012  . Obesities, morbid   . Blood clotting disorder   . Cataract   . Sleep apnea   . Arthritis   . Vertigo   . A-fib   . Anticoagulated on Coumadin   . PAF (paroxysmal atrial fibrillation) 02/05/2012   Past Medical History:  Past Medical History  Diagnosis Date  . Obesities, morbid   . Skin cancer   . Cataract   . Sleep apnea   . Osteoporosis   . Arthritis   . Vertigo   . Blood clotting disorder     In Lungs  . Endometriosis   . A-fib   . Anticoagulated on Coumadin    Past Surgical History:  Past Surgical History  Procedure Laterality Date  . Oophorectomy    . Nose surgery    . Skin surgery    . Gallbladder surgery    . Uvulopalatopharyngoplasty    . Cataract surgery     HPI:  Jacqueline Nguyen is an 79 y.o. female with PMH of obesity, PAF on coumadin, OSA, b/l PE, and arthritis presented to the hospital 5/29 with complaints of sudden onset painless right eye vision loss 2 days prior.  Pt reports recurrence last night.  No focal speech/communication changes.     Assessment / Plan / Recommendation Clinical Impression  Pt presents with baseline cognitive-communicative function; normal speech; anxious but pleasant demeanor.  No SLP f/u warranted.      SLP Assessment  Patient does not need any further Speech Lanaguage Pathology Services    Follow Up Recommendations  None       Pertinent Vitals/Pain No pain   SLP Goals     SLP Evaluation Prior Functioning  Cognitive/Linguistic Baseline: Within functional limits Type of Home: Other (Comment) (townhome - one story) Lives With: Alone;Other (Comment) (aid five hours per day M-F) Available Help at Discharge: Personal care attendant    Cognition  Overall Cognitive Status: Within Functional Limits for tasks assessed    Comprehension  Auditory Comprehension Overall Auditory Comprehension: Appears within functional limits for tasks assessed Visual Recognition/Discrimination Discrimination: Within Function Limits Reading Comprehension Reading Status:  (visual deficits PTA - no glasses available)    Expression Expression Primary Mode of Expression: Verbal Verbal Expression Overall Verbal Expression: Appears within functional limits for tasks assessed Written Expression Dominant Hand: Right Written Expression: Not tested   Oral / Motor Oral Motor/Sensory Function Overall Oral Motor/Sensory Function: Appears within functional limits for tasks assessed Motor Speech Overall Motor Speech: Appears within functional limits for tasks assessed   Jacqueline Nguyen L. Tivis Nguyen, [place] CCC/SLP Pager [phone]      Jacqueline Nguyen Jacqueline Nguyen 08/30/2012, 9:44 AM

## 2012-08-31 DIAGNOSIS — M129 Arthropathy, unspecified: Secondary | ICD-10-CM

## 2012-08-31 LAB — URINE CULTURE

## 2012-08-31 LAB — PROTIME-INR
INR: 2.23 — ABNORMAL HIGH (ref 0.00–1.49)
Prothrombin Time: 23.7 seconds — ABNORMAL HIGH (ref 11.6–15.2)

## 2012-08-31 MED ORDER — BRIMONIDINE TARTRATE 0.2 % OP SOLN
1.0000 [drp] | Freq: Two times a day (BID) | OPHTHALMIC | Status: DC
  Administered 2012-08-31: 1 [drp] via OPHTHALMIC
  Filled 2012-08-31: qty 5

## 2012-08-31 NOTE — Progress Notes (Signed)
Stroke Team Progress Note  HISTORY Jacqueline Nguyen is an 78 y.o. female with PMH of obesity, PAF on coumadin, OSA, b/l PE, and atrhritis presenting to the hospital today with complaints of sudden onset painless right eye vision loss 2 days prior. She explains that she recently had b/l cataract surgery, L eye was in March 2014 and R eye was recently operated on 08/13/12. She complains of having no post op complications and was followed up appropriately with ophthalmologist, Dr. Prudencio Burly. She was working on her computer Tuesday, 08/27/12 evening and shortly after when she went to sit down on the couch she had sudden onset black out of right eye with lightening like images in blue/purple color. She was able to see from left eye. She called her son who then called the emergency ophthalmologist. She was apparently evaluated by the on call physician at that time and returned home. She was then seen again in the ophthalmolgoist office yesterday and referred to PCP for further work up including echo and carotid dopplers. She regained some vision in right eye yesterday afternoon/evening after coming back home from ophthalomogist office. However, t her vision remained blurry from the right eye and she also complained of dizziness and feeling like she was going to fall.  She denies any similar prior episodes, no pain in the right eye, no headaches, N/V/D, fever, chills, diplopia, dysphagia, photophobia, or any weakness more than usual. She denies any falls or LOC. She does have some occasional light-headedness when she strains in the bathroom usually. The dizziness has since then resolved.  Of note, she endorse feeling like she was in Afib again this morning and took her atenolol and noticed improvement.    Date last known well: 08/27/12  Time last known well: approximately afternoon time  tPA Given: No: out of window    SUBJECTIVE A family members present morning. The patient feels her vision is much improved. She is  anxious for discharge as she feels she is not able to rest here in the hospital.  OBJECTIVE Most recent Vital Signs: Filed Vitals:   08/30/12 2200 08/31/12 0200 08/31/12 0600 08/31/12 1120  BP: 152/72 116/61 147/76 132/55  Pulse: 71 61 68 65  Temp: 97.5 F (36.4 C) 97.8 F (36.6 C) 97.4 F (36.3 C) 97.6 F (36.4 C)  TempSrc: Oral Oral Oral Oral  Resp: _0 Height:      Weight:      SpO2: 98% 97% 98% 95%   CBG (last 3)  No results found for this basename: GLUCAP,  in the last 72 hours  IV Fluid Intake:     MEDICATIONS  . atenolol  25 mg Oral BID  . brimonidine  1 drop Both Eyes Q12H  . brinzolamide  1 drop Both Eyes BID  . budesonide-formoterol  2 puff Inhalation BID  . diltiazem  240 mg Oral Daily  . fluticasone  2 puff Inhalation BID  . meloxicam  7.5 mg Oral Daily  . timolol  1 drop Both Eyes BID  . warfarin  2.5 mg Oral q1800  . Warfarin - Pharmacist Dosing Inpatient   Does not apply q1800   PRN:  HYDROcodone-acetaminophen, LORazepam, senna-docusate, sodium phosphate `1 Diet:  Cardiac thin liquids Activity:  Out of bed with assist DVT Prophylaxis:  Therapeutic INR on coumadin  CLINICALLY SIGNIFICANT STUDIES Basic Metabolic Panel:   Recent Labs Lab 08/29/12 0910  NA 143  K 4.0  CL 103  CO2 31  GLUCOSE 125*  BUN 12  CREATININE 0.70  CALCIUM 9.7   Liver Function Tests: No results found for this basename: AST, ALT, ALKPHOS, BILITOT, PROT, ALBUMIN,  in the last 168 hours CBC:   Recent Labs Lab 08/29/12 0910  WBC 7.1  NEUTROABS 4.5  HGB 14.0  HCT 43.3  MCV 93.1  PLT 323   Coagulation:   Recent Labs Lab 08/29/12 0910 08/30/12 0550 08/31/12 0348  LABPROT 25.2* 24.7* 23.7*  INR 2.42* 2.35* 2.23*   Cardiac Enzymes: No results found for this basename: CKTOTAL, CKMB, CKMBINDEX, TROPONINI,  in the last 168 hours Urinalysis:   Recent Labs Lab 08/29/12 0845  COLORURINE YELLOW  LABSPEC 1.004*  PHURINE 7.0  GLUCOSEU NEGATIVE  HGBUR  NEGATIVE  BILIRUBINUR NEGATIVE  KETONESUR NEGATIVE  PROTEINUR NEGATIVE  UROBILINOGEN 0.2  NITRITE NEGATIVE  LEUKOCYTESUR NEGATIVE   Lipid Panel    Component Value Date/Time   CHOL 205* 08/30/2012 0550   TRIG 102 08/30/2012 0550   HDL 55 08/30/2012 0550   CHOLHDL 3.7 08/30/2012 0550   VLDL 20 08/30/2012 0550   LDLCALC 130* 08/30/2012 0550   HgbA1C  Lab Results  Component Value Date   HGBA1C 5.8* 08/30/2012    Urine Drug Screen:   No results found for this basename: labopia,  cocainscrnur,  labbenz,  amphetmu,  thcu,  labbarb    Alcohol Level: No results found for this basename: ETH,  in the last 168 hours  Ct Head Wo Contrast 08/29/2012  1.  Atrophy and microvascular ischemic disease without acute intracranial process. 2.  Polypoid mucosal thickening within the left sphenoid sinus.  No air fluid levels.     Mr Brain Wo Contrast 08/30/2012   Generalized atrophy without acute abnormality.     MRA of the brain    2D Echocardiogram EF 60% no cardiac source of embolism   Carotid Doppler Bilateral: No evidence of hemodynamically significant internal carotid artery stenosis. Vertebral artery flow is antegrade  CXR    EKG  sinus bradycardia.   Therapy Recommendations home health physical therapy. No followup occupational therapy. Intermittent supervision.  Physical Exam   Mental Status:  Alert, oriented, thought content appropriate. Speech fluent without evidence of aphasia. Able to follow 3 step commands without difficulty.  Cranial Nerves:  II: Discs flat bilaterally; Visual fields grossly normal, pupils equal, round, reactive to light and accommodation. Complaints of blurry vision with right eye, improved when eye lid held up. Able to count fingers well and even tell time on clock from right eye. III,IV, VI: right lid lag down to level of mid pupil, extra-ocular motions intact bilaterally, conjugation b/l present  V,VII: smile symmetric, facial light touch sensation normal  bilaterally  VIII: hearing normal bilaterally  IX,X: gag reflex not tested  XI: bilateral shoulder shrug  XII: midline tongue extension  Motor:  Right : Upper extremity 5/5 Left: Upper extremity 4/5  Lower extremity 5/5 Lower extremity 4/5  Tone and bulk:normal tone throughout; no atrophy noted  Sensory: Pinprick and light touch intact throughout, bilaterally  Deep Tendon Reflexes: 2+ and symmetric throughout  Plantars:  Right: downgoing Left: downgoing  Cerebellar:  B/l hand tremors noted with finger-to-nose, heel-to-shin test slower with LLE due to complaints of arthritis and pain with movement  Gait: unable to assess, walks with support  CV: pulses palpable throughout   ASSESSMENT [name] is a 79 y.o. female presenting with right sided painless sudden vision loss over a 2 day period time which has improved but not  normalized. She denies any headache. At this point she can see colors .Brain  Imaging confirms no acute infarct.  Retina lInfarct felt to be   secondary to retinal artery branch occlusion from embolism from atrial fibrillation.  On warfarin prior to admission. Now on warfarin for secondary stroke prevention. Patient with resultant right vision loss. Work up underway.   Morbid obesity  Atrial fibrillation  Pulmonary embolism history  Former smoker, continue cessation counseling  Hyperlipidemia, LDL 130  Long term high risk medication therapy with coumadin  Hospital day # 2  TREATMENT/PLAN  Continue warfarin for secondary stroke prevention.  Consider changing to xarelto due to coumadin failure. She has an appt with Dr. Acie Fredrickson and would like to discuss this with him.  LDL goal < 100 in non-diabetics, < 70 in diabetics. She is worried about starting medications for this as she has heard that they affect muscles. Again, she would like to discuss this with Dr. Acie Fredrickson. Family tells me that she has appt with him in 2 weeks.  Risk factor modification  Therapy  recommendations - no followup occupational therapy. Home health physical therapy recommended with intermittent supervision.  The stroke team will sign off at this time. Please call for questions or further assistance.  Mikey Bussing PA-C [facility] Pager [phone] 08/31/2012, 1:15 PM  I have personally obtained a history, examined the patient, evaluated imaging results, and formulated the assessment and plan of care. I agree with the above.   Pt's R eye is back to baseline. F/up  In clinic as out pt.  Leotis Pain

## 2012-08-31 NOTE — Progress Notes (Signed)
   CARE MANAGEMENT NOTE 08/31/2012  Patient:  Jacqueline Nguyen, Jacqueline Nguyen   Account Number:  [id number]  Date Initiated:  08/31/2012  Documentation initiated by:  [facility]  Subjective/Objective Assessment:   Blurred vision, right eye, Atrial Fibrillation     Action/Plan:   lives at home alone, son Georgiann Mccoy   Anticipated DC Date:  08/31/2012   Anticipated DC Plan:  [place]  CM consult      [place]   Choice offered to / List presented to:  C-1 Patient   DME arranged  3-N-1  Vassie Moselle      DME agency  [facility]     HH arranged  HH-2 PT      [place].   Status of service:  Completed, signed off Medicare Important Message given?   (If response is "NO", the following Medicare IM given date fields will be blank) Date Medicare IM given:   Date Additional Medicare IM given:    Discharge Disposition:  [place]  Per UR Regulation:    If discussed at Long Length of Stay Meetings, dates discussed:    Comments:  08/31/2012 1400 NCM spoke to pt and states she has DME with [employer]. Offered choice for [facility] and pt agreeable to [facility] for [facility]. Faxed orders to [facility] and [facility] for follow up. Pt gave permission to speak with son, Georgiann Mccoy # [phone]. Spoke to [facility] rep and they will deliver DME on Monday. Will notify pt's son of delivery time. Jonnie Finner RN CCM Case Mgmt phone [phone]

## 2012-08-31 NOTE — Progress Notes (Signed)
Pt CPAP from home not working properly. RT called to see if they could troubleshoot machine.  RT brought in house machine for patient to use while here.

## 2012-08-31 NOTE — Discharge Summary (Signed)
Physician Discharge Summary  Jacqueline Nguyen ZOX:[id number] DOB: [birth year] DOA: 08/29/2012  PCP: Asencion Gowda  Admit date: 08/29/2012 Discharge date: 08/31/2012  Time spent: 30 minutes  Recommendations for Outpatient Follow-up:  1. The patient was strongly recommended to transition to Xarelto from Coumadin by the neurology service due to concerns of Coumadin failure. The patient refused beginning treatment in the hospital instead wishes to discuss this with her cardiologist, Dr. Acie Fredrickson. 2. Patient was also strongly recommended to start a statin, however she again refuses to begin this without first discussing with Dr. Acie Fredrickson  Discharge Diagnoses:  Principal Problem:   Blurred vision, right eye Active Problems:   PAF (paroxysmal atrial fibrillation)   Blood clotting disorder   Cataract   Sleep apnea   A-fib   Anticoagulated on Coumadin   Discharge Condition: Stable  Diet recommendation: Regular  Filed Weights   08/29/12 2120  Weight: 125.1 kg (275 lb 12.7 oz)    History of present illness:  Jacqueline Nguyen is a 79 y.o. female, with history of morbid obesity, atrial fibrillation on Coumadin, history of PE, chronic dizziness, obstructive sleep apnea on nighttime CPAP, who had right eye cataract surgery 7 days prior to admission, transferred from [facility].   Patient with above history with right eye cataract surgery 7 days ago experienced right eye vision loss 2 nights prior to admission, she saw her eye surgeon the next day she was told that she should get a stroke workup and that this was unlikely to be related to her cataract surgery, she then presented today to [place] ER where a head CT was unremarkable and then she was sent here for further workup, off note her right eye vision loss is almost completely gone, her vision is now much improved however it remains blurry. She denies any headache, no focal weakness, she takes Coumadin for atrial fibrillation and  INR is therapeutic.   Hospital Course:  The neurology service was consulted. There was strong suspicion for possible central or branch retinal artery occlusion a 2-D echocardiogram, carotid Dopplers and MRI/MRA of the brain were obtained, which were unremarkable. The patient was initially continued on Coumadin for stroke prevention. Later, the neurology service did suggest changing to Xarelto given concerns of possible Coumadin failure. The patient states that she would like to first discuss this with Dr. Acie Fredrickson before proceeding. The patient has otherwise remained medically stable is appropriate for continued and close outpatient followup  Procedures:  Carotid Dopplers on 08/30/2012: No significant stenosis 2-D echocardiogram on 08/29/2012:- No cardiac source of embolism was identified, but cannot be ruled out on the basis of this examination.  Consultations:  Neurology  Discharge Exam: Filed Vitals:   08/31/12 0200 08/31/12 0600 08/31/12 1120 08/31/12 1335  BP: 116/61 147/76 132/55 139/55  Pulse: 61 68 65 61  Temp: 97.8 F (36.6 C) 97.4 F (36.3 C) 97.6 F (36.4 C) 98.7 F (37.1 C)  TempSrc: Oral Oral Oral Oral  Resp: _0 Height:      Weight:      SpO2: 97% 98% 95% 100%    General: Patient is awake in no apparent distress Cardiovascular: Regular, S1-S2 Respiratory: Normal respiratory effort, no crackles no wheezing  Discharge Instructions       Future Appointments Provider Department Dept Phone   09/26/2012 1:30 PM Thayer Headings, MD [place]) [phone]       Medication List    TAKE these medications  albuterol 108 (90 BASE) MCG/ACT inhaler  Commonly known as:  PROVENTIL HFA;VENTOLIN HFA  Inhale 2 puffs into the lungs every 6 (six) hours as needed for wheezing.     ARNICARE ARNICA Crea  Apply topically daily.     atenolol 25 MG tablet  Commonly known as:  TENORMIN  Take 25 mg by mouth 2 (two) times daily.      brinzolamide 1 % ophthalmic suspension  Commonly known as:  AZOPT  Place 1 drop into both eyes 2 (two) times daily.     budesonide-formoterol 160-4.5 MCG/ACT inhaler  Commonly known as:  SYMBICORT  Inhale 2 puffs into the lungs 2 (two) times daily.     COMBIGAN 0.2-0.5 % ophthalmic solution  Generic drug:  brimonidine-timolol  Place 1 drop into both eyes every 12 (twelve) hours.     diltiazem 240 MG 24 hr capsule  Commonly known as:  DILACOR XR  Take 240 mg by mouth daily.     fluticasone 44 MCG/ACT inhaler  Commonly known as:  FLOVENT HFA  Inhale 2 puffs into the lungs 2 (two) times daily.     HYDROcodone-acetaminophen 7.5-325 MG per tablet  Commonly known as:  NORCO  Take 1 tablet by mouth every 4 (four) hours as needed for pain.     levalbuterol 0.63 MG/3ML nebulizer solution  Commonly known as:  XOPENEX  Take 1 ampule by nebulization every 8 (eight) hours as needed for wheezing.     LORazepam 1 MG tablet  Commonly known as:  ATIVAN  Take 1 mg by mouth every 4 (four) hours as needed for anxiety.     MAGNESIUM PO  Take by mouth daily.     meclizine 25 MG tablet  Commonly known as:  ANTIVERT  Take 25 mg by mouth every 6 (six) hours as needed.     meloxicam 7.5 MG tablet  Commonly known as:  MOBIC  Take 7.5 mg by mouth daily.     MINERAL COMPLEX PO  Take 1 tablet by mouth daily.     mometasone 50 MCG/ACT nasal spray  Commonly known as:  NASONEX  Place 2 sprays into the nose 2 (two) times daily.     MSM PO  Take 300 mg by mouth daily.     [place] 100000 UNIT/GM Powd  Apply topically.     OLIVE LEAF EXTRACT PO  Take 450 mg by mouth daily.     Turmeric 500 MG Caps  Take 500 mg by mouth daily as needed.     VITAMIN C PO  Take 600 mg by mouth daily.     warfarin 2.5 MG tablet  Commonly known as:  COUMADIN  Take 2.5 mg by mouth daily. Except Sunday and Thursday takes 1 and 1/2 tablets . Patient states has home tester and target range is 2-3 if test is over  3 she will omit the extra 1/2 on these two days       Allergies  Allergen Reactions  . Ciprofloxacin   . Bactrim Rash  . Codeine Nausea Only   Follow-up Information   Follow up with Darden Amber., MD In 2 weeks.   Contact information:   [place] [address] [phone]       Follow up with Asencion Gowda In 2 weeks.   Contact information:   [address] [place] [address] [phone]        The results of significant diagnostics from  this hospitalization (including imaging, microbiology, ancillary and laboratory) are listed below for reference.    Significant Diagnostic Studies: Ct Head Wo Contrast  08/29/2012   *RADIOLOGY REPORT*  Clinical Data: Dizzy, weakness, palpitations  CT HEAD WITHOUT CONTRAST  Technique:  Contiguous axial images were obtained from the base of the skull through the vertex without contrast.  Comparison: None.  Findings:  There is mild diffuse primarily centralized volume loss with commiserate ex vacuo dilatation of the ventricular system. Bilateral basal ganglial calcifications.  Scattered minimal periventricular hypodensities compatible with microvascular ischemic disease.  Given background parenchymal abnormalities, there is no CT evidence of acute large territory infarct.  No intraparenchymal or extra-axial mass or hemorrhage.  No midline shift. Vascular calcifications within the bilateral carotid siphons.  Minimal polypoid mucosal thickening within the left sphenoid sinus. The remaining paranasal sinuses and mastoid air cells are normally aerated.  Post bilateral cataract surgery.  Regional soft tissues are normal.  No displaced calvarial fracture.  IMPRESSION:  1.  Atrophy and microvascular ischemic disease without acute intracranial process. 2.  Polypoid mucosal thickening within the left sphenoid sinus.  No air fluid levels.   Original Report Authenticated By: Jake Seats, MD   Mr Brain Wo  Contrast  08/30/2012   *RADIOLOGY REPORT*  Clinical Data: Stroke.  Atrial fibrillation on Coumadin.  Right eye vision loss 2 days ago.  MRI HEAD WITHOUT CONTRAST  Technique:  Multiplanar, multiecho pulse sequences of the brain and surrounding structures were obtained according to standard protocol without intravenous contrast.  Comparison: CT head 08/29/2012  Findings: Incomplete study.  Axial T2 and diffusion weighted imaging were successfully performed.  The patient had symptoms of burning pain in the arms and the scan was terminated.  Generalized atrophy.  Ventricular enlargement is stable most likely due to atrophy.  Negative for acute infarction.  No significant chronic ischemia. Brainstem and cerebellum are intact.  Negative for mass or edema.  Paranasal sinuses are clear.  IMPRESSION: Generalized atrophy without acute abnormality.   Original Report Authenticated By: Carl Best, M.D.    Microbiology: Recent Results (from the past 240 hour(s))  URINE CULTURE     Status: None   Collection Time    08/29/12 10:00 PM      Result Value Range Status   Specimen Description URINE, CATHETERIZED   Final   Special Requests NONE   Final   Culture  Setup Time 08/30/2012 10:19   Final   Colony Count 10,000 COLONIES/ML   Final   Culture     Final   Value: Multiple bacterial morphotypes present, none predominant. Suggest appropriate recollection if clinically indicated.   Report Status 08/31/2012 FINAL   Final     Labs: Basic Metabolic Panel:  Recent Labs Lab 08/29/12 0910  NA 143  K 4.0  CL 103  CO2 31  GLUCOSE 125*  BUN 12  CREATININE 0.70  CALCIUM 9.7   Liver Function Tests: No results found for this basename: AST, ALT, ALKPHOS, BILITOT, PROT, ALBUMIN,  in the last 168 hours No results found for this basename: LIPASE, AMYLASE,  in the last 168 hours No results found for this basename: AMMONIA,  in the last 168 hours CBC:  Recent Labs Lab 08/29/12 0910  WBC 7.1  NEUTROABS 4.5   HGB 14.0  HCT 43.3  MCV 93.1  PLT 323   Cardiac Enzymes: No results found for this basename: CKTOTAL, CKMB, CKMBINDEX, TROPONINI,  in the last 168 hours BNP: BNP (last 3 results)  No results found for this basename: PROBNP,  in the last 8760 hours CBG: No results found for this basename: GLUCAP,  in the last 168 hours     Signed:  Jasha Hodzic K  [facility] 08/31/2012, 2:21 PM

## 2012-08-31 NOTE — Progress Notes (Signed)
ANTICOAGULATION CONSULT NOTE - Follow Up Consult  Pharmacy Consult:  Coumadin Indication:  PAF + history of PE  Allergies  Allergen Reactions  . Ciprofloxacin   . Bactrim Rash  . Codeine Nausea Only    Patient Measurements: Height: 5' 6" (167.6 cm) Weight: 275 lb 12.7 oz (125.1 kg) IBW/kg (Calculated) : 59.3  Vital Signs: Temp: 97.4 F (36.3 C) (05/31 0600) Temp src: Oral (05/31 0600) BP: 147/76 mmHg (05/31 0600) Pulse Rate: 68 (05/31 0600)  Labs:  Recent Labs  08/29/12 0910 08/30/12 0550 08/31/12 0348  HGB 14.0  --   --   HCT 43.3  --   --   PLT 323  --   --   LABPROT 25.2* 24.7* 23.7*  INR 2.42* 2.35* 2.23*  CREATININE 0.70  --   --     Estimated Creatinine Clearance: 78.3 ml/min (by C-G formula based on Cr of 0.7).      Assessment: 79 year old female continues on Coumadin for PAF.  Admitted 08/30/12 with multiple complaints including blurred vision and palpitations, concerned for TIA.  MRI negative.  INR remains therapeutic today.  No bleeding reported.   Goal of Therapy:  INR 2-3 Monitor platelets by anticoagulation protocol: Yes    Plan:  - Coumadin 2.31m PO daily at 1800 - Daily PT / INR for now    Nidal Rivet D. DMina Marble PharmD, BCPS Pager:  [phone]/31/2014, 11:21 AM

## 2012-09-01 NOTE — Progress Notes (Signed)
   CARE MANAGEMENT NOTE 09/01/2012  Patient:  Jacqueline Nguyen, Jacqueline Nguyen   Account Number:  [id number]  Date Initiated:  08/31/2012  Documentation initiated by:  [facility]  Subjective/Objective Assessment:   Blurred vision, right eye, Atrial Fibrillation     Action/Plan:   lives at home alone, son Jacqueline Nguyen   Anticipated DC Date:  08/31/2012   Anticipated DC Plan:  [place]  CM consult      [place]   Choice offered to / List presented to:  C-1 Patient   DME arranged  3-N-1  Vassie Moselle      DME agency  [facility]     HH arranged  HH-2 PT      [place].   Status of service:  Completed, signed off Medicare Important Message given?   (If response is "NO", the following Medicare IM given date fields will be blank) Date Medicare IM given:   Date Additional Medicare IM given:    Discharge Disposition:  [place]  Per UR Regulation:    If discussed at Long Length of Stay Meetings, dates discussed:    Comments:  09/01/2012 1720 NCM spoke to son and he heard from [place]. He is requesting an advance notice from [facility] before coming out. He flies for the airline and wants to be present for first visit. Jonnie Finner RN CCM Case Mgmt phone [phone]  08/31/2012 1400 NCM spoke to pt and states she has DME with [employer]. Offered choice for [facility] and pt agreeable to [facility] for [facility]. Faxed orders to [facility] and [facility] for follow up. Pt gave permission to speak with son, Jacqueline Nguyen # [phone]. Spoke to [facility] rep and they will deliver DME on Monday. Will notify pt's son of delivery time. Jonnie Finner RN CCM Case Mgmt phone [phone]

## 2012-09-26 ENCOUNTER — Ambulatory Visit (INDEPENDENT_AMBULATORY_CARE_PROVIDER_SITE_OTHER): Payer: Medicare Other | Admitting: Cardiovascular Disease

## 2012-09-26 VITALS — BP 138/78 | HR 66 | Ht 66.0 in | Wt 273.0 lb

## 2012-09-26 DIAGNOSIS — I4891 Unspecified atrial fibrillation: Secondary | ICD-10-CM

## 2012-09-26 NOTE — Assessment & Plan Note (Signed)
Her a-Fib is well controlled.  She had some transient right eye blindness. An MRI did not confirm the presence of a stroke. It's possible that she had a slight retinal infarct. The neurology team suggested that she discontinue the Coumadin and start Xarelto.  She did not want to start the Xarelto want to talk to me instead.  We had long discussion today. I told her I liked Xarelto and prescribed it quite often.  She is still hesitant and wants to stay on coumadin.  Her INR levels are checked at home with her home monitor and reported to her medical doctor.  She is in NSR today.  We will continue her current meds.  I will see her in 1 year.

## 2012-09-26 NOTE — Patient Instructions (Addendum)
Your physician wants you to follow-up in: 1 year  You will receive a reminder letter in the mail two months in advance. If you don't receive a letter, please call our office to schedule the follow-up appointment.  Your physician recommends that you continue on your current medications as directed. Please refer to the Current Medication list given to you today.  

## 2012-09-26 NOTE — Progress Notes (Signed)
Jacqueline Nguyen Date of Birth  12-31-1931       [facility]    [employer] [address], [place], [place]  [address]   [place], [place]  [address] [phone]     [phone]   Fax  [phone]    Fax [phone]  Problem List: 1.  Pulmonary embolus - 2009 massive bilateral PE 2. Hypertension 3. osteoperosis - severe degener 4.  Morbid obesity 5. Paroxysmal Atrial Fibrillation 6  sleep apnea - wears CPAP   History of Present Illness:  Jacqueline Nguyen is a 79 yo - former patient of Dr. Jory Sims - transferred care here. She was accompanied by her son, Jacqueline Nguyen today. She has had PAF and has had recent worsening episodes of her PAF.  She walks with a walker.    She has numerous complaints and appears to be easily distracted. It Was very helpful have her son present today. He was able to help her stay on target with are interview.  Jacqueline Nguyen:  Jacqueline Nguyen was admitted to the hospital with transient  Right eye blindness - thought to have a stroke.  MRI revealed atrophy by no evidence of a CVA.  She had just had cataract surgery and also was started on Mobic.  She read that the mobic could cause some vision problems.  She stopped the Mobic and has not had any visual problems since that time.   Current Outpatient Prescriptions on File Prior to Visit  Medication Sig Dispense Refill  . albuterol (PROVENTIL HFA;VENTOLIN HFA) 108 (90 BASE) MCG/ACT inhaler Inhale 2 puffs into the lungs every 6 (six) hours as needed for wheezing.      . Ascorbic Acid (VITAMIN C PO) Take 600 mg by mouth daily.      Marland Kitchen atenolol (TENORMIN) 25 MG tablet Take 25 mg by mouth 2 (two) times daily.      . brimonidine-timolol (COMBIGAN) 0.2-0.5 % ophthalmic solution Place 1 drop into both eyes every 12 (twelve) hours.       . brinzolamide (AZOPT) 1 % ophthalmic suspension Place 1 drop into both eyes 2 (two) times daily.       . budesonide-formoterol (SYMBICORT) 160-4.5 MCG/ACT  inhaler Inhale 2 puffs into the lungs 2 (two) times daily.      . fluticasone (FLOVENT HFA) 44 MCG/ACT inhaler Inhale 2 puffs into the lungs 2 (two) times daily.      . Homeopathic Products (ARNICARE ARNICA) CREA Apply topically daily.      Marland Kitchen HYDROcodone-acetaminophen (NORCO) 7.5-325 MG per tablet Take 1 tablet by mouth every 4 (four) hours as needed for pain.      Marland Kitchen levalbuterol (XOPENEX) 0.63 MG/3ML nebulizer solution Take 1 ampule by nebulization every 8 (eight) hours as needed for wheezing.      Marland Kitchen LORazepam (ATIVAN) 1 MG tablet Take 1 mg by mouth every 4 (four) hours as needed for anxiety.       Marland Kitchen MAGNESIUM PO Take by mouth daily.      . meclizine (ANTIVERT) 25 MG tablet Take 25 mg by mouth every 6 (six) hours as needed.        . Methylsulfonylmethane (MSM PO) Take 300 mg by mouth daily.      . mometasone (NASONEX) 50 MCG/ACT nasal spray Place 2 sprays into the nose 2 (two) times daily.      . Multiple Minerals (MINERAL COMPLEX PO) Take 1 tablet by mouth daily.       Marland Kitchen  Nystatin ([place]) 100000 UNIT/GM POWD Apply topically.      Marland Kitchen OLIVE LEAF EXTRACT PO Take 450 mg by mouth daily.      . Turmeric 500 MG CAPS Take 500 mg by mouth daily as needed.       No current facility-administered medications on file prior to visit.    Allergies  Allergen Reactions  . Ciprofloxacin   . Bactrim Rash  . Codeine Nausea Only    Past Medical History  Diagnosis Date  . Obesities, morbid   . Skin cancer   . Cataract   . Sleep apnea   . Osteoporosis   . Arthritis   . Vertigo   . Blood clotting disorder     In Lungs  . Endometriosis   . A-fib   . Anticoagulated on Coumadin     Past Surgical History  Procedure Laterality Date  . Oophorectomy    . Nose surgery    . Skin surgery    . Gallbladder surgery    . Uvulopalatopharyngoplasty    . Cataract surgery      History  Smoking status  . Former Smoker  Smokeless tobacco  . Never Used    History  Alcohol Use No    No family  history on file.  Reviw of Systems:  Reviewed in the HPI.  All other systems are negative.  Physical Exam: Blood pressure 138/78, pulse 66, height _0  (1.676 m), weight 273 lb (123.832 kg). General: Well developed, well nourished, in no acute distress.  Head: Normocephalic, atraumatic, sclera non-icteric, mucus membranes are moist,   Neck: Supple. Carotids are 2 + without bruits. No JVD  Lungs: Clear bilaterally to auscultation.  Heart: regular rate.  normal  S1 S2. No murmurs, gallops or rubs.  Abdomen: Soft, non-tender, non-distended with normal bowel sounds. No hepatomegaly. No rebound/guarding. No masses.  Msk:  Strength and tone are normal  Extremities: No clubbing or cyanosis. No edema.  Distal pedal pulses are 2+ and equal bilaterally.  Neuro: Alert and oriented X 3. Moves all extremities spontaneously.  Psych:  Responds to questions appropriately with a normal affect.  ECG: Nov. 4, 2013 - sinus brady at 53, no ST  Assessment / Plan:

## 2012-09-27 ENCOUNTER — Encounter: Payer: Self-pay | Admitting: Cardiovascular Disease

## 2012-09-29 ENCOUNTER — Encounter: Payer: Self-pay | Admitting: Cardiovascular Disease

## 2012-09-30 ENCOUNTER — Other Ambulatory Visit: Payer: Self-pay | Admitting: *Deleted

## 2012-09-30 NOTE — Progress Notes (Signed)
I called the pharmacy to be sure correct cardizem cd was ordered. Pharmacist reviewed and assisted.

## 2012-11-06 ENCOUNTER — Other Ambulatory Visit: Payer: Self-pay

## 2013-02-06 ENCOUNTER — Other Ambulatory Visit: Payer: Self-pay

## 2013-07-30 ENCOUNTER — Ambulatory Visit: Payer: Medicare Other | Admitting: Cardiovascular Disease

## 2013-08-04 ENCOUNTER — Telehealth: Payer: Self-pay | Admitting: Cardiovascular Disease

## 2013-08-05 ENCOUNTER — Telehealth: Payer: Self-pay | Admitting: Cardiovascular Disease

## 2013-08-05 MED ORDER — DILTIAZEM HCL ER COATED BEADS 120 MG PO CP24: 120.0000 mg | ORAL_CAPSULE | Freq: Every day | ORAL | Status: DC

## 2013-08-05 MED ORDER — ATENOLOL 25 MG PO TABS: 25.0000 mg | ORAL_TABLET | Freq: Every day | ORAL | Status: DC

## 2013-08-05 NOTE — Telephone Encounter (Signed)
Jacqueline Nguyen will come in to sed Dr.Croitoru in July, but wants to speak to someone about her being in Afib and her bp is 87/42 and 97/40 .Marland KitchenShe feels like her afib is getting worse  ... Please call    Thanks

## 2013-08-05 NOTE — Telephone Encounter (Signed)
Closed encounters

## 2013-08-05 NOTE — Telephone Encounter (Signed)
New Prob   Pt states she has been experiencing worsening A-Fib. Requesting to speak to a nurse regarding this.

## 2013-08-05 NOTE — Telephone Encounter (Signed)
Pt called back to office stating that she would like to remain as Dr Elmarie Shiley patient and would like to be seen by him this week or next week sometime. Advised pt that I will forward this message for review to Dr Acie Fredrickson and nurse. Pt verbalized understanding

## 2013-08-05 NOTE — Telephone Encounter (Signed)
Returned call and pt verified x 2.  Pt informed message received.  Pt stated her BP was 87/47 and 93/40 in L arm and 99/46 in R arm.  Stated she had Afib in L arm and none in R arm when she checked.  Stated her monitor has a light that shows when she is in Afib.  Pt c/o having low BPs and thinks it may be b/c she took all of her medicine at one time.  Pt wants to know what she can do.  Stated she has been seeing Dr. Acie Fredrickson, but wants to switch to Dr. Loletha Grayer and her appt is in July.   Pt advised she should contact Dr. Elmarie Shiley office to be seen by him or a MLP.  Pt informed she has not established care in our office yet and is currently under his care until she sees Dr. Sallyanne Kuster in July.  Pt advised to go to ER for CP, SOB, dizziness or lightheadedness w/ high HR Afib and to otherwise contact Dr. Elmarie Shiley office for evaluation until she can be seen.  Pt was also advised she may not want to take all of her BP meds at one time if her BP tends to be low after taking them like that.  Pt verbalized understanding and agreed w/ plan.

## 2013-08-05 NOTE — Telephone Encounter (Signed)
Pt called with complaints of low bp and states my afib is out of control.  Current bp readings are 87/42, 93/40, 99/46 in the left arm and 99/46 in the right arm. HR is between 55-60 irregular.  Pt stating she is very light headed and dizzy. No c/o of sob or chest pain at this time.  Pt has a 24 hr cna at bedside.  Confirmed meds with pt bc last OV with Nahser was in 6/14. Pt states she is currently taking Atenolol 25 mg QID instead of what are records say which is 25 mg bid.  Pt states she is also taking Cardizem 178m BID.  Went to DOD Dr SMarlou Porchwith pts complaints bc Dr NAcie Fredricksonout of office.  New order from Dr SMarlou Porchfor pt to start taking Atenolol 25 mg one time daily and take Cardizem 120 mg one time daily. Pts new med changes sent to pharmacy of choice.  Advised pt of this and advised her to log bp and hr for one week and contact our office with results. Advised pt if she has any chest pain, sob, or is in any distress, she needs to call 911.  Pt verbalized understanding. Will forward this message to Dr NAcie Fredricksonand nurse for further review.

## 2013-08-05 NOTE — Telephone Encounter (Signed)
I agree with the reduction of the atenolol and diltiazem dose as ordered by Dr. Marlou Porch.

## 2013-08-06 NOTE — Telephone Encounter (Signed)
Spoke with patient who states she is feeling better today than yesterday.  Patient reports BP this morning was 117/52, HR 58 and 132/42, HR 60 @ 0545; and 114/61, HR 63 at 1030.  Patient states she monitors her INR at home and she checked it last night and it was 3.  Patient states her target is 2-3 and so she skipped her Coumadin dose yesterday as she has been instructed to do so in the past since she did not hear back from monitoring company.  Patient reports today she has taken Atenolol x 1 and Cardizem x 1 and she feels better.  Patient states her heart rate remains irregular but has not been fast.  I advised patient that June 1 is Dr. Elmarie Shiley first available appointment and that I will continue to monitor cancellations and will call her to schedule her sooner if the opportunity arises.  I discussed symptoms for which the patient should call 911 in the meantime and advised patient to call back with questions or concerns.  Patient verbalized understanding and agreement and thanked me for the call.

## 2013-08-20 ENCOUNTER — Ambulatory Visit (INDEPENDENT_AMBULATORY_CARE_PROVIDER_SITE_OTHER): Payer: Medicare Other | Admitting: Nurse Practitioner

## 2013-08-20 ENCOUNTER — Encounter: Payer: Self-pay | Admitting: Nurse Practitioner

## 2013-08-20 VITALS — BP 130/70 | HR 63

## 2013-08-20 DIAGNOSIS — I480 Paroxysmal atrial fibrillation: Secondary | ICD-10-CM

## 2013-08-20 DIAGNOSIS — Z7901 Long term (current) use of anticoagulants: Secondary | ICD-10-CM

## 2013-08-20 DIAGNOSIS — I4891 Unspecified atrial fibrillation: Secondary | ICD-10-CM

## 2013-08-20 LAB — TSH: TSH: 0.95 u[IU]/mL (ref 0.35–4.50)

## 2013-08-20 LAB — BASIC METABOLIC PANEL
BUN: 9 mg/dL (ref 6–23)
CO2: 30 mEq/L (ref 19–32)
Calcium: 9.3 mg/dL (ref 8.4–10.5)
Chloride: 102 mEq/L (ref 96–112)
Creatinine, Ser: 0.7 mg/dL (ref 0.4–1.2)
GFR: 87.05 mL/min (ref >60.00)
Glucose, Bld: 103 mg/dL — ABNORMAL HIGH (ref 70–99)
Potassium: 4.2 mEq/L (ref 3.5–5.1)
Sodium: 139 mEq/L (ref 135–145)

## 2013-08-20 LAB — CBC
HCT: 41 % (ref 36.0–46.0)
Hemoglobin: 13.4 g/dL (ref 12.0–15.0)
MCHC: 32.7 g/dL (ref 30.0–36.0)
MCV: 92.4 fl (ref 78.0–100.0)
Platelets: 343 10*3/uL (ref 150.0–400.0)
RBC: 4.44 Mil/uL (ref 3.87–5.11)
RDW: 14.1 % (ref 11.5–15.5)
WBC: 6.6 10*3/uL (ref 4.0–10.5)

## 2013-08-20 LAB — HEPATIC FUNCTION PANEL
ALT: 12 U/L (ref 0–35)
AST: 20 U/L (ref 0–37)
Albumin: 3.5 g/dL (ref 3.5–5.2)
Alkaline Phosphatase: 58 U/L (ref 39–117)
Bilirubin, Direct: 0.1 mg/dL (ref 0.0–0.3)
Total Bilirubin: 0.5 mg/dL (ref 0.2–1.2)
Total Protein: 7 g/dL (ref 6.0–8.3)

## 2013-08-20 NOTE — Progress Notes (Signed)
Jacqueline Nguyen Date of Birth: [birth year] Medical Record #[id number]  History of Present Illness: Jacqueline Nguyen is seen back today for a follow up visit. Seen for Dr. Acie Fredrickson. She is a 80 year old female with PAF, remote PE in 2009 (massive bilateral PE), HTN, osteoporosis, morbid obesity, and OSA on CPAP.  Last seen a year ago. Had had some transient right eye blindness - no CVA on MRI. Discussed going on Xarelto - she was quite hesitant and opted to stay on coumadin.  Comes in today. Here alone. She remains morbidly obese. She has had recurrent bouts of atrial fib - was up to taking atenolol 4 times a day - noted BP too low - now just back to just one atenolol and one cardizem a day. Does feel better. Some shortness of breath. Lots of body aches. Takes lots of supplements. Has live in help Monday thru Friday. No longer driving. Seems quite sedentary. Says she has a Architectural technologist that comes to her house. No recent labs. Saw her PCP yesterday for coughing - given Zpak for bronchitis.   Current Outpatient Prescriptions  Medication Sig Dispense Refill  . albuterol (PROVENTIL HFA;VENTOLIN HFA) 108 (90 BASE) MCG/ACT inhaler Inhale 2 puffs into the lungs every 6 (six) hours as needed for wheezing.      . Ascorbic Acid (VITAMIN C PO) Take 600 mg by mouth daily.      Marland Kitchen atenolol (TENORMIN) 25 MG tablet Take 1 tablet (25 mg total) by mouth daily.  180 tablet  3  . brimonidine-timolol (COMBIGAN) 0.2-0.5 % ophthalmic solution Place 1 drop into both eyes every 12 (twelve) hours.       . brinzolamide (AZOPT) 1 % ophthalmic suspension Place 1 drop into both eyes 2 (two) times daily.       . budesonide-formoterol (SYMBICORT) 160-4.5 MCG/ACT inhaler Inhale 2 puffs into the lungs 2 (two) times daily.      Marland Kitchen diltiazem (CARDIZEM CD) 120 MG 24 hr capsule Take 1 capsule (120 mg total) by mouth daily.  90 capsule  3  . fluticasone (FLOVENT HFA) 44 MCG/ACT inhaler Inhale 2 puffs into the lungs 2 (two) times daily.      .  Homeopathic Products (ARNICARE ARNICA) CREA Apply topically daily.      Marland Kitchen HYDROcodone-acetaminophen (NORCO) 7.5-325 MG per tablet Take 1 tablet by mouth every 4 (four) hours as needed for pain.      Marland Kitchen levalbuterol (XOPENEX) 0.63 MG/3ML nebulizer solution Take 1 ampule by nebulization every 8 (eight) hours as needed for wheezing.      Marland Kitchen LORazepam (ATIVAN) 1 MG tablet Take 1 mg by mouth every 4 (four) hours as needed for anxiety.       Marland Kitchen MAGNESIUM PO Take by mouth daily.      . meclizine (ANTIVERT) 25 MG tablet Take 25 mg by mouth every 6 (six) hours as needed.        . mometasone (NASONEX) 50 MCG/ACT nasal spray Place 2 sprays into the nose 2 (two) times daily.      . Multiple Minerals (MINERAL COMPLEX PO) Take 1 tablet by mouth daily.       . NON FORMULARY Place 3 L into the nose at bedtime. 3 liter as needed during day depending on extertion      . Nystatin ([place]) 100000 UNIT/GM POWD Apply topically.      Marland Kitchen OLIVE LEAF EXTRACT PO Take 450 mg by mouth daily.      . Turmeric 500  MG CAPS Take 500 mg by mouth daily as needed.      . Warfarin Sodium (JANTOV[place] PO) Take 2.5 mg by mouth daily.       No current facility-administered medications for this visit.    Allergies  Allergen Reactions  . Ciprofloxacin   . Bactrim Rash  . Codeine Nausea Only    Past Medical History  Diagnosis Date  . Obesities, morbid   . Skin cancer   . Cataract   . Sleep apnea   . Osteoporosis   . Arthritis   . Vertigo   . Blood clotting disorder     In Lungs  . Endometriosis   . A-fib   . Anticoagulated on Coumadin     Past Surgical History  Procedure Laterality Date  . Oophorectomy    . Nose surgery    . Skin surgery    . Gallbladder surgery    . Uvulopalatopharyngoplasty    . Cataract surgery      History  Smoking status  . Former Smoker  Smokeless tobacco  . Never Used    History  Alcohol Use No    History reviewed. No pertinent family history.  Review of Systems: The review of  systems is per the HPI.  All other systems were reviewed and are negative.  Physical Exam: BP 130/70  Pulse 63  SpO2 96% Patient is morbidly obese but in no acute distress. Skin is warm and dry. Color is normal.  HEENT is unremarkable. Normocephalic/atraumatic. PERRL. Sclera are nonicteric. Neck is supple. No masses. No JVD. Lungs are clear. Distant breath sounds. Has oxygen with her but not using. Cardiac exam shows a regular rate and rhythm. Heart tones are distant. Abdomen is obese but soft. Extremities are full. Gait and ROM are intact. No gross neurologic deficits noted.  Wt Readings from Last 3 Encounters:  09/26/12 273 lb (123.832 kg)  08/29/12 275 lb 12.7 oz (125.1 kg)  02/05/12 242 lb 12.8 oz (110.133 kg)     LABORATORY DATA:  Lab Results  Component Value Date   WBC 7.1 08/29/2012   HGB 14.0 08/29/2012   HCT 43.3 08/29/2012   PLT 323 08/29/2012   GLUCOSE 125* 08/29/2012   CHOL 205* 08/30/2012   TRIG 102 08/30/2012   HDL 55 08/30/2012   LDLCALC 130* 08/30/2012   ALT 19 04/11/2011   AST 19 04/11/2011   NA 143 08/29/2012   K 4.0 08/29/2012   CL 103 08/29/2012   CREATININE 0.70 08/29/2012   BUN 12 08/29/2012   CO2 31 08/29/2012   INR 2.23* 08/31/2012   HGBA1C 5.8* 08/30/2012    Lab Results  Component Value Date   INR 2.23* 08/31/2012   INR 2.35* 08/30/2012   INR 2.42* 08/29/2012    Assessment / Plan: 1. PAF - seems to be in sinus today. Would favor conservative management with rate control and anticoagulation.   2. Chronic anticoagulation - needs follow up labs today.   3. Remote bilateral PE - remains on chronic anticoagulation  4. Morbid obesity - this would be the crux of her issues - I do not see this changing.   Will check her labs today. See Dr. Acie Fredrickson back in a year.   Patient is agreeable to this plan and will call if any problems develop in the interim.   Burtis Junes, RN, [facility] [address] [place],  [place]  [address] [phone]

## 2013-08-20 NOTE — Patient Instructions (Signed)
We will check labs today  Stay on your current medicines  Check your INR tomorrow since you are on antibiotics  See Dr. Acie Fredrickson in one year  Call the [place] office at [phone] if you have any questions, problems or concerns.

## 2013-08-21 ENCOUNTER — Telehealth: Payer: Self-pay | Admitting: Nurse Practitioner

## 2013-08-21 NOTE — Telephone Encounter (Signed)
S/w pt thought the office was checking INR I stated pt suppose to do it today pt does home monitor

## 2013-08-21 NOTE — Telephone Encounter (Signed)
Patient is returning your call. Please call her back she has questions about her lab results. Please call and advise.

## 2013-08-26 ENCOUNTER — Telehealth: Payer: Self-pay | Admitting: Cardiovascular Disease

## 2013-08-26 NOTE — Telephone Encounter (Signed)
Left message for patient to call me at the office

## 2013-08-26 NOTE — Telephone Encounter (Signed)
I agree with holding BP meds ( Cardiazem and ? Others) if her BP is that low. Will need to reassess the need for those meds.

## 2013-08-26 NOTE — Telephone Encounter (Signed)
Called and spoke with patient who reports blood pressure has been low daily after taking Cardizem.  Patient reports BP has been in the 38'B systolic and the 01'B diastolic (did not have readings in front of her and could not get to them at the time of our call).  Patient states blood pressure was lower yesterday than today.  Patient has been taking Atenolol 25 mg in the mornings and has been feeling well; states she begins to feel bad after taking her Cardizem in the afternoons.  Patient complains of dizziness occasionally, especially in the afternoons after taking Cardizem.  I advised patient to be cautious so as not to fall and patient reports she has a health care aide for some hours during the day and she wears an alert button that will notify authorities if she falls.  Patient states she has been taking antibiotics for pneumonia and states she has been drinking plenty of water.  I advised patient to drink an electrolyte drink today and to hold Cardizem today and that I will forward message to Dr. Acie Fredrickson for advice.  Patient is aware that it will be tomorrow before she receives a call back from our office.  Patient verbalized understanding and gratitude.

## 2013-08-26 NOTE — Telephone Encounter (Signed)
New message    Patient having issues with blood pressure going low . Would like for the nurse to call to discuss

## 2013-08-27 ENCOUNTER — Ambulatory Visit: Payer: Medicare Other | Admitting: Internal Medicine

## 2013-08-27 NOTE — Telephone Encounter (Signed)
Follow up     Returning a nurses call

## 2013-08-27 NOTE — Telephone Encounter (Signed)
Contacted pt with Dr Elmarie Shiley recommendation that she can hold her Diltiazem for now, because readings look good without it.  Pt does not have a follow-up appointment scheduled with Dr Acie Fredrickson.  Advised pt that Dr Elmarie Shiley nurse will be here tomorrow and I will forward this message to her so she can call her to schedule a follow up OV with Dr Acie Fredrickson.  Pt verbalized understanding of instructions given and agrees with this plan.

## 2013-08-27 NOTE — Telephone Encounter (Signed)
Spoke with pt to check on her current status:  Pt states that she is feeling "somewhat better today." Pt states that she has been holding her Cardizem since 5/25 due to low bp readings.  Pt states she still is taking her Atenolol 25 mg po daily.  Pts current bp and hr readings are :    08/27/13  2:50 am- BP- 116/42 in Right arm  HR- 74;  BP- 113/50 Left Arm HR- 69  08/27/13   0845- BP 128/46 Left arm and BP 134/53 Right arm   Advised pt that Dr Acie Fredrickson did say he agreed with holding BP meds for right now while pressure is low, but will need to reassess the need for those meds.   Pt concerned to stay off of her Cardizem for too long due to history of blood clots and afib.   Told pt I will send this update to Dr Acie Fredrickson to further evaluate and advise.  Someone from our office will contact her back with his recommendations.  Also advised pt to continue drinking plenty of fluids- water and gatorade to prevent dehydration and electrolyte imbalance.   Pt verbalized understanding and very pleased with all the follow-up provided.      Advised pt that Dr Acie Fredrickson did say he agreed with holding BP meds for right now while pressure is low, but will need to reassess the need for those meds.

## 2013-08-27 NOTE — Telephone Encounter (Signed)
Ok to hold diltiazem for now.  Her readings look ok without it. Will assess her at her next office visit.

## 2013-08-28 NOTE — Telephone Encounter (Signed)
Spoke with patient who states she is feeling "a little better today."  Patient reports BP at 0430 today - 115/45, HR 77.  Patient states she did take Cardizem yesterday because she did not feel comfortable going without it every day.  Patient states she is drinking plenty of water and is drinking mineral water and/or Gatorade in addition to filtered water. I advised patient that she needs to come into the office for follow-up within the next 2 weeks per Dr. Acie Fredrickson.  Patient verbalized understanding and agreement; scheduled for Monday June 1 with Dr. Acie Fredrickson.

## 2013-09-01 ENCOUNTER — Encounter: Payer: Self-pay | Admitting: Cardiovascular Disease

## 2013-09-01 ENCOUNTER — Ambulatory Visit (INDEPENDENT_AMBULATORY_CARE_PROVIDER_SITE_OTHER): Payer: Medicare Other | Admitting: Cardiovascular Disease

## 2013-09-01 ENCOUNTER — Ambulatory Visit: Payer: Medicare Other | Admitting: Cardiovascular Disease

## 2013-09-01 VITALS — BP 135/70 | HR 58 | Ht 66.0 in | Wt 271.0 lb

## 2013-09-01 DIAGNOSIS — I10 Essential (primary) hypertension: Secondary | ICD-10-CM | POA: Insufficient documentation

## 2013-09-01 DIAGNOSIS — I480 Paroxysmal atrial fibrillation: Secondary | ICD-10-CM

## 2013-09-01 DIAGNOSIS — I4891 Unspecified atrial fibrillation: Secondary | ICD-10-CM

## 2013-09-01 NOTE — Progress Notes (Signed)
Jacqueline Nguyen Date of Birth  05/03/1931       [facility]    [employer] [address], [address], [place], [place]  [address]   [place], [place]  [address] [phone]     [phone]   Fax  [phone]    Fax [phone]  Problem List: 1.  Pulmonary embolus - 2009 massive bilateral PE 2. Hypertension 3. osteoperosis - severe degener 4.  Morbid obesity 5. Paroxysmal Atrial Fibrillation 6  sleep apnea - wears CPAP   History of Present Illness:  Jacqueline Nguyen is a 80 yo - former patient of Dr. Jory Sims - transferred care here. She was accompanied by her son, Jacqueline Nguyen today. She has had PAF and has had recent worsening episodes of her PAF.  She walks with a walker.    She has numerous complaints and appears to be easily distracted. It Was very helpful have her son present today. He was able to help her stay on target with are interview.  September 26, 2012:  Jacqueline Nguyen was admitted to the hospital with transient  Right eye blindness - thought to have a stroke.  MRI revealed atrophy by no evidence of a CVA.  She had just had cataract surgery and also was started on Mobic.  She read that the mobic could cause some vision problems.  She stopped the Mobic and has not had any visual problems since that time.   September 01, 2013:  She is doing OK from a cardiac standpoint.    She does have some palpitations - early in the AM  Current Outpatient Prescriptions on File Prior to Visit  Medication Sig Dispense Refill  . albuterol (PROVENTIL HFA;VENTOLIN HFA) 108 (90 BASE) MCG/ACT inhaler Inhale 2 puffs into the lungs every 6 (six) hours as needed for wheezing.      . Ascorbic Acid (VITAMIN C PO) Take 600 mg by mouth daily.      Marland Kitchen atenolol (TENORMIN) 25 MG tablet Take 1 tablet (25 mg total) by mouth daily.  180 tablet  3  . brimonidine-timolol (COMBIGAN) 0.2-0.5 % ophthalmic solution Place 1 drop into both eyes every 12 (twelve) hours.       . brinzolamide (AZOPT) 1  % ophthalmic suspension Place 1 drop into both eyes 2 (two) times daily.       . budesonide-formoterol (SYMBICORT) 160-4.5 MCG/ACT inhaler Inhale 2 puffs into the lungs 2 (two) times daily.      Marland Kitchen diltiazem (CARDIZEM CD) 120 MG 24 hr capsule Take 1 capsule (120 mg total) by mouth daily.  90 capsule  3  . fluticasone (FLOVENT HFA) 44 MCG/ACT inhaler Inhale 2 puffs into the lungs 2 (two) times daily.      . Homeopathic Products (ARNICARE ARNICA) CREA Apply topically daily.      Marland Kitchen HYDROcodone-acetaminophen (NORCO) 7.5-325 MG per tablet Take 1 tablet by mouth every 4 (four) hours as needed for pain.      Marland Kitchen levalbuterol (XOPENEX) 0.63 MG/3ML nebulizer solution Take 1 ampule by nebulization every 8 (eight) hours as needed for wheezing.      Marland Kitchen LORazepam (ATIVAN) 1 MG tablet Take 1 mg by mouth every 4 (four) hours as needed for anxiety.       Marland Kitchen MAGNESIUM PO Take by mouth daily.      . meclizine (ANTIVERT) 25 MG tablet Take 25 mg by mouth every 6 (six) hours as needed.        Marland Kitchen  mometasone (NASONEX) 50 MCG/ACT nasal spray Place 2 sprays into the nose 2 (two) times daily.      . Multiple Minerals (MINERAL COMPLEX PO) Take 1 tablet by mouth daily.       . NON FORMULARY Place 3 L into the nose at bedtime. 3 liter as needed during day depending on extertion      . Nystatin ([place]) 100000 UNIT/GM POWD Apply topically.      Marland Kitchen OLIVE LEAF EXTRACT PO Take 450 mg by mouth daily.      . Turmeric 500 MG CAPS Take 500 mg by mouth daily as needed.      . Warfarin Sodium (JANTOVEN PO) Take 2.5 mg by mouth daily.       No current facility-administered medications on file prior to visit.    Allergies  Allergen Reactions  . Ciprofloxacin   . Bactrim Rash  . Codeine Nausea Only    Past Medical History  Diagnosis Date  . Obesities, morbid   . Skin cancer   . Cataract   . Sleep apnea   . Osteoporosis   . Arthritis   . Vertigo   . Blood clotting disorder     In Lungs  . Endometriosis   . A-fib   .  Anticoagulated on Coumadin     Past Surgical History  Procedure Laterality Date  . Oophorectomy    . Nose surgery    . Skin surgery    . Gallbladder surgery    . Uvulopalatopharyngoplasty    . Cataract surgery      History  Smoking status  . Former Smoker  Smokeless tobacco  . Never Used    History  Alcohol Use No    No family history on file.  Reviw of Systems:  Reviewed in the HPI.  All other systems are negative.  Physical Exam: Blood pressure 135/70, pulse 58, height _0  (1.676 m), weight 271 lb (122.925 kg). General: Well developed, well nourished, in no acute distress.  Head: Normocephalic, atraumatic, sclera non-icteric, mucus membranes are moist,   Neck: Supple. Carotids are 2 + without bruits. No JVD  Lungs: Clear bilaterally to auscultation.  Heart: regular rate.  normal  S1 S2. No murmurs, gallops or rubs.  Abdomen: Soft, non-tender, non-distended with normal bowel sounds. No hepatomegaly. No rebound/guarding. No masses.  Msk:  Strength and tone are normal  Extremities: No clubbing or cyanosis. No edema.  Distal pedal pulses are 2+ and equal bilaterally.  Neuro: Alert and oriented X 3. Moves all extremities spontaneously.  Psych:  Responds to questions appropriately with a normal affect.  ECG: September 01, 2013:  Sinus brady at 58.  No St or T wave changes.   Assessment / Plan:

## 2013-09-01 NOTE — Patient Instructions (Signed)
Your physician recommends that you continue on your current medications as directed. Please refer to the Current Medication list given to you today.  Your physician wants you to follow-up in: 1 year with Dr. Acie Fredrickson.  You will receive a reminder letter in the mail two months in advance. If you don't receive a letter, please call our office to schedule the follow-up appointment.

## 2013-09-01 NOTE — Assessment & Plan Note (Signed)
She remains in NSR. Continue current meds.

## 2013-09-01 NOTE — Assessment & Plan Note (Signed)
Her BP is OK.  She had some erroneous readings this past week which scared her.    I've reassured her that her BP readings look OK.  I think her BP cuff is not reading correctly.    She has a Systems analyst who would be able to help her take her BP correctly.    I reassured her that she was doing ok.  I will see her in 1 year.    I've encouraged her to make sure her CPAP is working well.

## 2013-09-05 ENCOUNTER — Ambulatory Visit: Payer: Medicare Other | Admitting: Cardiovascular Disease

## 2013-10-07 ENCOUNTER — Ambulatory Visit: Payer: Medicare Other | Admitting: Cardiovascular Disease

## 2013-11-12 ENCOUNTER — Telehealth: Payer: Self-pay | Admitting: Cardiovascular Disease

## 2013-11-12 NOTE — Telephone Encounter (Signed)
Called pt back to assure her that there were no earlier appts

## 2013-11-12 NOTE — Telephone Encounter (Signed)
Unless the referring MD requested the appointment with Dr. Sallyanne Kuster this patient can be called and scheduled with any MD. Please check all doctor schedules. Otherwise this is the first appointment that is available for a new patient with Dr. Sallyanne Kuster.

## 2013-11-12 NOTE — Telephone Encounter (Signed)
Pt called in wanting to know if she could an earlier appt than 8/20 because she went in to see her PCP recently and was told that her heart resting rate when she is sleeping is very low and needs to be evaluated immediately. Please call  Thanks

## 2013-11-13 ENCOUNTER — Telehealth: Payer: Self-pay | Admitting: Cardiovascular Disease

## 2013-11-13 NOTE — Telephone Encounter (Signed)
Jacqueline Nguyen is calling because she had to call EMT this morning , she has AFIB and it feels like her heart is coming out of her chest it woke her out of her sleep , feels jitterly, shaking and pain that was in her back to her breast bone  That feels real achy and tight  Not severe pain ,but it is uncomfortable . She is a New patient to see DR. Croitoru on 11/20/13... Please Call    Thanks

## 2013-11-13 NOTE — Telephone Encounter (Signed)
Returned call to patient she stated her heart woke her up this morning beating fast and hard.Stated she called EMS and when they got there her heart had slowed down.Stated she had a slight achy feeling in chest and slight chest tightness.Stated she feels better now.Stated she scheduled appointment with Dr.Croitoru 11/20/13 and she thinks she needs sooner appointment.When reviewing chart patient sees Dr.Nahser.Dr.Nahser has a cancellation for tomorrow 11/14/13 at 3:30 pm.Patient stated she will see Dr.Nahser 11/14/13 at 3:30 pm.Advised to go to ER if needed.

## 2013-11-14 ENCOUNTER — Ambulatory Visit (INDEPENDENT_AMBULATORY_CARE_PROVIDER_SITE_OTHER): Payer: Medicare Other | Admitting: Cardiovascular Disease

## 2013-11-14 ENCOUNTER — Telehealth: Payer: Self-pay | Admitting: Internal Medicine

## 2013-11-14 ENCOUNTER — Encounter: Payer: Self-pay | Admitting: Cardiovascular Disease

## 2013-11-14 VITALS — BP 153/68 | HR 63 | Ht 66.0 in | Wt 273.0 lb

## 2013-11-14 DIAGNOSIS — I10 Essential (primary) hypertension: Secondary | ICD-10-CM

## 2013-11-14 DIAGNOSIS — I480 Paroxysmal atrial fibrillation: Secondary | ICD-10-CM

## 2013-11-14 DIAGNOSIS — I4891 Unspecified atrial fibrillation: Secondary | ICD-10-CM

## 2013-11-14 DIAGNOSIS — G4730 Sleep apnea, unspecified: Secondary | ICD-10-CM

## 2013-11-14 NOTE — Patient Instructions (Addendum)
Your physician has recommended that you wear an event monitor. Event monitors are medical devices that record the heart's electrical activity. Doctors most often [place] these monitors to diagnose arrhythmias. Arrhythmias are problems with the speed or rhythm of the heartbeat. The monitor is a small, portable device. You can wear one while you do your normal daily activities. This is usually used to diagnose what is causing palpitations/syncope (passing out).   Patient has requested to see Dr. Orene Desanctis - please call the office at [facility] to make an appointment ([phone])   Dr. Acie Fredrickson called Dr. [facility] and his office should contact you for an appointment

## 2013-11-14 NOTE — Assessment & Plan Note (Addendum)
She's having symptoms of palpitations especially when she wakes up. She's concerned that her CPAP mask be working properly and it sounds like this may be causing some of her symptoms. We'll place a 30 day event monitor on her.    She needs to have her CPAP mask checked.  We will need to refer her to a doctor who manages sleep apnea.   I called Dr. Annamaria Boots who has seen her before.

## 2013-11-14 NOTE — Progress Notes (Signed)
Selina Cooley Date of Birth  1931-12-07       [facility]    [employer] [address], [address], [place], [place]  [address]   [place], [place]  [address] [phone]     [phone]   Fax  [phone]    Fax [phone]  Problem List: 1.  Pulmonary embolus - 2009 massive bilateral PE 2. Hypertension 3. osteoperosis - severe degener 4.  Morbid obesity 5. Paroxysmal Atrial Fibrillation 6  sleep apnea - wears CPAP   History of Present Illness:  Deshae is a 80 yo - former patient of Dr. Jory Sims - transferred care here. She was accompanied by her son, [name] today. She has had PAF and has had recent worsening episodes of her PAF.  She walks with a walker.    She has numerous complaints and appears to be easily distracted. It Was very helpful have her son present today. He was able to help her stay on target with are interview.  September 26, 2012:  Telly was admitted to the hospital with transient  Right eye blindness - thought to have a stroke.  MRI revealed atrophy by no evidence of a CVA.  She had just had cataract surgery and also was started on Mobic.  She read that the mobic could cause some vision problems.  She stopped the Mobic and has not had any visual problems since that time.   September 01, 2013:  She is doing OK from a cardiac standpoint.    She does have some palpitations - early in the AM  11/14/2013: Patient was recently at her primary medical doctor's office and was found to have a slow heart rate. She was advised to come and see [place]. She went to the [facility] ER on July 18 for symptoms of not feeling well.   She was told that her HR was too low.    She has been walking up with palpitations, dizziness.    She thinks that her CPAP is not working as well as it needs it.    She wants to change cardiologist and see Dr. Sallyanne Kuster.    Current Outpatient Prescriptions on File Prior to Visit  Medication Sig Dispense Refill  .  albuterol (PROVENTIL HFA;VENTOLIN HFA) 108 (90 BASE) MCG/ACT inhaler Inhale 2 puffs into the lungs every 6 (six) hours as needed for wheezing.      . Ascorbic Acid (VITAMIN C PO) Take 600 mg by mouth daily.      Marland Kitchen atenolol (TENORMIN) 25 MG tablet Take 1 tablet (25 mg total) by mouth daily.  180 tablet  3  . brimonidine-timolol (COMBIGAN) 0.2-0.5 % ophthalmic solution Place 1 drop into both eyes every 12 (twelve) hours.       . brinzolamide (AZOPT) 1 % ophthalmic suspension Place 1 drop into both eyes 2 (two) times daily.       . budesonide-formoterol (SYMBICORT) 160-4.5 MCG/ACT inhaler Inhale 2 puffs into the lungs 2 (two) times daily.      Marland Kitchen diltiazem (CARDIZEM CD) 120 MG 24 hr capsule Take 1 capsule (120 mg total) by mouth daily.  90 capsule  3  . fluticasone (FLOVENT HFA) 44 MCG/ACT inhaler Inhale 2 puffs into the lungs 2 (two) times daily.      . Homeopathic Products (ARNICARE ARNICA) CREA Apply topically daily.      Marland Kitchen HYDROcodone-acetaminophen (NORCO) 7.5-325 MG per tablet Take 1 tablet by mouth every 6 (six) hours  as needed.       . levalbuterol (XOPENEX) 0.63 MG/3ML nebulizer solution Take 1 ampule by nebulization every 8 (eight) hours as needed for wheezing.      Marland Kitchen LORazepam (ATIVAN) 1 MG tablet Take 1 mg by mouth every 4 (four) hours as needed for anxiety.       Marland Kitchen MAGNESIUM PO Take by mouth daily.      . meclizine (ANTIVERT) 25 MG tablet Take 25 mg by mouth every 6 (six) hours as needed.        . Multiple Minerals (MINERAL COMPLEX PO) Take 1 tablet by mouth daily.       . NON FORMULARY Place 3 L into the nose at bedtime. 3 liter as needed during day depending on extertion      . Nystatin ([place]) 100000 UNIT/GM POWD Apply topically.      Marland Kitchen OLIVE LEAF EXTRACT PO Take 450 mg by mouth daily.      . Turmeric 500 MG CAPS Take 500 mg by mouth daily as needed.      . Warfarin Sodium (JANTOVEN PO) Take 2.5 mg by mouth daily.       No current facility-administered medications on file prior to  visit.    Allergies  Allergen Reactions  . Ciprofloxacin   . Bactrim Rash  . Codeine Nausea Only    Past Medical History  Diagnosis Date  . Obesities, morbid   . Skin cancer   . Cataract   . Sleep apnea   . Osteoporosis   . Arthritis   . Vertigo   . Blood clotting disorder     In Lungs  . Endometriosis   . A-fib   . Anticoagulated on Coumadin     Past Surgical History  Procedure Laterality Date  . Oophorectomy    . Nose surgery    . Skin surgery    . Gallbladder surgery    . Uvulopalatopharyngoplasty    . Cataract surgery      History  Smoking status  . Former Smoker  Smokeless tobacco  . Never Used    History  Alcohol Use No    No family history on file.  Reviw of Systems:  Reviewed in the HPI.  All other systems are negative.  Physical Exam: Blood pressure 153/68, pulse 63, height 5' 6" (1.676 m), weight 273 lb (123.832 kg). General: Well developed, well nourished, in no acute distress.  Head: Normocephalic, atraumatic, sclera non-icteric, mucus membranes are moist,   Neck: Supple. Carotids are 2 + without bruits. No JVD  Lungs: Clear bilaterally to auscultation.  Heart: regular rate.  normal  S1 S2. No murmurs, gallops or rubs.  Abdomen: Soft, non-tender, non-distended with normal bowel sounds. No hepatomegaly. No rebound/guarding. No masses.  Msk:  Strength and tone are normal  Extremities: No clubbing or cyanosis. No edema.  Distal pedal pulses are 2+ and equal bilaterally.  Neuro: Alert and oriented X 3. Moves all extremities spontaneously.  Psych:  Responds to questions appropriately with a normal affect.  ECG: November 14, 2013:   NSR at 29.  No ST or T wave changes    Assessment / Plan:

## 2013-11-14 NOTE — Assessment & Plan Note (Addendum)
She is in NSR today.  She has a relative who sees Dr. Sallyanne Kuster and she wants to see Dr. Sallyanne Kuster for follow up.  She tried to get an appt. With him but came to see me again because she did not want to wait for another appt.   We will get a 30 day monitor.

## 2013-11-17 NOTE — Telephone Encounter (Signed)
Pt returning call.Jacqueline Nguyen ° °

## 2013-11-17 NOTE — Telephone Encounter (Signed)
Called pt. She is not able to come in today. She would like another work in date. Please advise thanks

## 2013-11-17 NOTE — Telephone Encounter (Signed)
Spoke with patient-she is aware I have worked her in for [facility] with CY on 11-26-13 at 11:15am; pt to be here at 11:00am to check in and fill out paper work. Pt is aware to bring a list of current meds(or actual bottles), insurance card, and any co-pay that she may have. Pt will also bring her CPAP to this visit as she is unsure if she has a chip in the back for download readings.   Nothing more needed at this time.

## 2013-11-18 ENCOUNTER — Encounter: Payer: Self-pay | Admitting: Radiology

## 2013-11-18 ENCOUNTER — Encounter (INDEPENDENT_AMBULATORY_CARE_PROVIDER_SITE_OTHER): Payer: Medicare Other

## 2013-11-18 DIAGNOSIS — I480 Paroxysmal atrial fibrillation: Secondary | ICD-10-CM

## 2013-11-18 DIAGNOSIS — I10 Essential (primary) hypertension: Secondary | ICD-10-CM

## 2013-11-18 DIAGNOSIS — G4730 Sleep apnea, unspecified: Secondary | ICD-10-CM

## 2013-11-18 DIAGNOSIS — I4891 Unspecified atrial fibrillation: Secondary | ICD-10-CM

## 2013-11-18 NOTE — Progress Notes (Signed)
Patient ID: Jacqueline Nguyen, female   DOB: 08-14-1931, 80 y.o.   MRN: [id number] [employer] 30 day monitor applied. EOS-12-20-13

## 2013-11-20 ENCOUNTER — Encounter: Payer: Self-pay | Admitting: Cardiovascular Disease

## 2013-11-20 ENCOUNTER — Ambulatory Visit (INDEPENDENT_AMBULATORY_CARE_PROVIDER_SITE_OTHER): Payer: Medicare Other | Admitting: Cardiovascular Disease

## 2013-11-20 VITALS — BP 144/62 | HR 68 | Resp 16 | Ht 66.0 in | Wt 272.4 lb

## 2013-11-20 DIAGNOSIS — R0602 Shortness of breath: Secondary | ICD-10-CM

## 2013-11-20 DIAGNOSIS — I480 Paroxysmal atrial fibrillation: Secondary | ICD-10-CM

## 2013-11-20 DIAGNOSIS — I4891 Unspecified atrial fibrillation: Secondary | ICD-10-CM

## 2013-11-20 DIAGNOSIS — I2789 Other specified pulmonary heart diseases: Secondary | ICD-10-CM

## 2013-11-20 DIAGNOSIS — I272 Pulmonary hypertension, unspecified: Secondary | ICD-10-CM

## 2013-11-20 NOTE — Patient Instructions (Signed)
Your physician has requested that you have an echocardiogram. Echocardiography is a painless test that uses sound waves to create images of your heart. It provides your doctor with information about the size and shape of your heart and how well your heart's chambers and valves are working. This procedure takes approximately one hour. There are no restrictions for this procedure.  Dr. Sallyanne Kuster recommends that you schedule a follow-up appointment in: 4-6 weeks.

## 2013-11-22 ENCOUNTER — Encounter: Payer: Self-pay | Admitting: Cardiovascular Disease

## 2013-11-22 DIAGNOSIS — I272 Pulmonary hypertension, unspecified: Secondary | ICD-10-CM | POA: Insufficient documentation

## 2013-11-22 NOTE — Assessment & Plan Note (Signed)
It is quite possible that her complaints are related to atrial fibrillation with rapid ventricular response. The event monitor will be helpful in deciding whether it is appropriate to increase her rate control medications. I would avoid increasing the beta blocker since she has bronchospasm, but there is plenty of room to increase the dose of diltiazem. If the episodes are very frequent, antiarrhythmic therapy may be considered.

## 2013-11-22 NOTE — Assessment & Plan Note (Signed)
She is a setup for significant pulmonary hypertension secondary to obesity, obstructive sleep apnea and a previous massive pulmonary embolism. I suspect that this is the substrate for her persistent dyspnea and is suggested by her physical exam. We'll try again to see if echocardiography can provide [place] an estimate of pulmonary artery pressures.

## 2013-11-22 NOTE — Progress Notes (Signed)
Patient ID: Jacqueline Nguyen, female   DOB: Dec 19, 1931, 80 y.o.   MRN: [id number]      Reason for office visit Paroxysmal atrial fibrillation, history of pulmonary embolism, obstructive sleep apnea   Jacqueline Nguyen was previously followed by Dr. Acie Fredrickson, previously saw Dr. Rayford Halsted. She is cousin with Jacqueline Nguyen, who is also my patient.  In 2009 she had a "massive" bilateral pulmonary embolism and she has long-standing systemic hypertension, morbid obesity, reactive airway disease, obstructive sleep apnea (on CPAP) and osteoporosis and degenerative arthritis. She wears oxygen at night and with activity and often uses a scooter. In 2014 she had transient right eye blindness, not long after cataract surgery. Has glaucoma She attributed it to treatment with Mobic. About 3 months ago she had pneumonia from which she recovered very slowly. She still doesn't think she is back to normal. She was recently seen in the [facility] emergency room.  Her major complaint is arrhythmia. She believes that she is "out of rhythm" daily and reports heart rates to the 130-140 with her pulse oximeter. Sometimes the arrhythmia wakes her from sleep at night. When it occurs, she has a "funny sensation" in her head her legs "feel like noodles" and she "can't think straight". She has been documented to have atrial fibrillation in the past. She takes a combination of low-dose beta blocker and low-dose diltiazem, but also takes inhaled short and long-acting bronchodilators. She had an event monitor placed just yesterday. There are no tracings available yet.  She is on long-standing treatment with warfarin and does not have a history of recent bleeding complications. She has never had a confirmed stroke or other arterial embolic event.  Current electrocardiogram is normal. Her echocardiogram in May of 2014 showed normal left ventricular size and systolic function with mild left ventricular hypertrophy. There was aortic valve sclerosis and  mitral annular calcification. The right ventricle was not well visualized. The left atrium was moderately dilated, the right atrium mildly dilated. The pulmonary artery pressure could not be estimated.  Allergies  Allergen Reactions  . Ciprofloxacin   . Bactrim Rash  . Codeine Nausea Only    Current Outpatient Prescriptions  Medication Sig Dispense Refill  . albuterol (PROVENTIL HFA;VENTOLIN HFA) 108 (90 BASE) MCG/ACT inhaler Inhale 2 puffs into the lungs every 6 (six) hours as needed for wheezing.      . Ascorbic Acid (VITAMIN C PO) Take 600 mg by mouth daily.      Marland Kitchen atenolol (TENORMIN) 25 MG tablet Take 1 tablet (25 mg total) by mouth daily.  180 tablet  3  . brimonidine-timolol (COMBIGAN) 0.2-0.5 % ophthalmic solution Place 1 drop into both eyes every 12 (twelve) hours.       . brinzolamide (AZOPT) 1 % ophthalmic suspension Place 1 drop into both eyes 2 (two) times daily.       . budesonide-formoterol (SYMBICORT) 160-4.5 MCG/ACT inhaler Inhale 2 puffs into the lungs 2 (two) times daily.      Marland Kitchen diltiazem (CARDIZEM CD) 120 MG 24 hr capsule Take 1 capsule (120 mg total) by mouth daily.  90 capsule  3  . fluticasone (FLOVENT HFA) 44 MCG/ACT inhaler Inhale 2 puffs into the lungs 2 (two) times daily.      . Homeopathic Products (ARNICARE ARNICA) CREA Apply topically daily.      Marland Kitchen HYDROcodone-acetaminophen (NORCO) 7.5-325 MG per tablet Take 1 tablet by mouth every 6 (six) hours as needed.       . levalbuterol (XOPENEX) 0.63 MG/3ML nebulizer  solution Take 1 ampule by nebulization every 8 (eight) hours as needed for wheezing.      Marland Kitchen LORazepam (ATIVAN) 1 MG tablet Take 1 mg by mouth every 4 (four) hours as needed for anxiety.       Marland Kitchen MAGNESIUM PO Take by mouth daily.      . meclizine (ANTIVERT) 25 MG tablet Take 25 mg by mouth every 6 (six) hours as needed.        . Multiple Minerals (MINERAL COMPLEX PO) Take 1 tablet by mouth daily.       . NON FORMULARY Place 3 L into the nose at bedtime. 3  liter as needed during day depending on extertion      . Nystatin ([place]) 100000 UNIT/GM POWD Apply topically.      Marland Kitchen OLIVE LEAF EXTRACT PO Take 450 mg by mouth daily.      . Turmeric 500 MG CAPS Take 500 mg by mouth daily as needed.      . Warfarin Sodium (JANTOVEN PO) Take 2.5 mg by mouth daily.       No current facility-administered medications for this visit.    Past Medical History  Diagnosis Date  . Obesities, morbid   . Skin cancer   . Cataract   . Sleep apnea   . Osteoporosis   . Arthritis   . Vertigo   . Blood clotting disorder     In Lungs  . Endometriosis   . A-fib   . Anticoagulated on Coumadin     Past Surgical History  Procedure Laterality Date  . Oophorectomy    . Nose surgery    . Skin surgery    . Gallbladder surgery    . Uvulopalatopharyngoplasty    . Cataract surgery      Family history reviewed. No pertinent inheritable problems  History   Social History  . Marital Status: Widowed    Spouse Name: N/A    Number of Children: N/A  . Years of Education: N/A   Occupational History  . Not on file.   Social History Main Topics  . Smoking status: Former Research scientist (life sciences)  . Smokeless tobacco: Never Used  . Alcohol Use: No  . Drug Use: No  . Sexual Activity: Not Currently   Other Topics Concern  . Not on file   Social History Narrative  . No narrative on file    Review of systems: She complains of palpitations, occasional dizziness, chronic shortness of breath, intermittent lower extremity edema. She does not have chest pain. She has occasional wheezing but currently does not have cough or hemoptysis. She has never experienced a full-blown syncope. The patient also denies abdominal pain, nausea, vomiting, dysphagia, diarrhea, constipation, polyuria, polydipsia, dysuria, hematuria, frequency, urgency, abnormal bleeding or bruising, fever, chills, unexpected weight changes, mood swings, change in skin or hair texture, change in voice quality, auditory or  visual problems, allergic reactions or rashes, new musculoskeletal complaints other than usual "aches and pains".   PHYSICAL EXAM BP 144/62  Pulse 68  Resp 16  Ht _0  (1.676 m)  Wt 272 lb 6.4 oz (123.56 kg)  BMI 43.99 kg/m2 Morbid obesity limits the physical exam General: Alert, oriented x3, no distress Head: no evidence of trauma, PERRL, EOMI, no exophtalmos or lid lag, no myxedema, no xanthelasma; normal ears, nose and oropharynx Neck:  jugular venous pulsations are difficult to see; brisk carotid pulses without delay and no carotid bruits Chest: clear to auscultation, no signs of consolidation by percussion  or palpation, normal fremitus, symmetrical and full respiratory excursions Cardiovascular: Unable to locate the apical impulse, regular rhythm, normal first and loud second heart sounds, no murmurs, rubs or gallops Abdomen: no tenderness or distention, no masses by palpation, no abnormal pulsatility or arterial bruits, normal bowel sounds, no hepatosplenomegaly Extremities: no clubbing, cyanosis or edema; 2+ radial, ulnar and brachial pulses bilaterally; 2+ right femoral, posterior tibial and dorsalis pedis pulses; 2+ left femoral, posterior tibial and dorsalis pedis pulses; no subclavian or femoral bruits Neurological: grossly nonfocal   EKG: Sinus rhythm, generalized low voltage related to obesity, slight delay in anterior R wave progression  Lipid Panel     Component Value Date/Time   CHOL 205* 08/30/2012 0550   TRIG 102 08/30/2012 0550   HDL 55 08/30/2012 0550   CHOLHDL 3.7 08/30/2012 0550   VLDL 20 08/30/2012 0550   LDLCALC 130* 08/30/2012 0550    BMET    Component Value Date/Time   NA 139 08/20/2013 1556   K 4.2 08/20/2013 1556   CL 102 08/20/2013 1556   CO2 30 08/20/2013 1556   GLUCOSE 103* 08/20/2013 1556   BUN 9 08/20/2013 1556   CREATININE 0.7 08/20/2013 1556   CALCIUM 9.3 08/20/2013 1556   GFRNONAA 81* 08/29/2012 0910   GFRAA >90 08/29/2012 0910     ASSESSMENT AND  PLAN PAF (paroxysmal atrial fibrillation) It is quite possible that her complaints are related to atrial fibrillation with rapid ventricular response. The event monitor will be helpful in deciding whether it is appropriate to increase her rate control medications. I would avoid increasing the beta blocker since she has bronchospasm, but there is plenty of room to increase the dose of diltiazem. If the episodes are very frequent, antiarrhythmic therapy may be considered.  Pulmonary hypertension She is a setup for significant pulmonary hypertension secondary to obesity, obstructive sleep apnea and a previous massive pulmonary embolism. I suspect that this is the substrate for her persistent dyspnea and is suggested by her physical exam. We'll try again to see if echocardiography can provide [place] an estimate of pulmonary artery pressures.   Patient Instructions  Your physician has requested that you have an echocardiogram. Echocardiography is a painless test that uses sound waves to create images of your heart. It provides your doctor with information about the size and shape of your heart and how well your heart's chambers and valves are working. This procedure takes approximately one hour. There are no restrictions for this procedure.  Dr. Sallyanne Kuster recommends that you schedule a follow-up appointment in: 4-6 weeks.      Orders Placed This Encounter  Procedures  . 2D Echocardiogram without contrast   No orders of the defined types were placed in this encounter.    Holli Humbles, MD, [place] [phone] office [phone] pager

## 2013-11-24 ENCOUNTER — Ambulatory Visit (HOSPITAL_COMMUNITY)
Admission: RE | Admit: 2013-11-24 | Discharge: 2013-11-24 | Disposition: A | Discharge status: Home / Self Care | Payer: Medicare Other | Source: Ambulatory Visit | Attending: Cardiology | Admitting: Cardiology

## 2013-11-24 DIAGNOSIS — I359 Nonrheumatic aortic valve disorder, unspecified: Secondary | ICD-10-CM | POA: Insufficient documentation

## 2013-11-24 DIAGNOSIS — I517 Cardiomegaly: Secondary | ICD-10-CM

## 2013-11-24 DIAGNOSIS — R011 Cardiac murmur, unspecified: Secondary | ICD-10-CM

## 2013-11-24 DIAGNOSIS — R0602 Shortness of breath: Secondary | ICD-10-CM

## 2013-11-24 DIAGNOSIS — R0609 Other forms of dyspnea: Secondary | ICD-10-CM | POA: Diagnosis present

## 2013-11-24 NOTE — Progress Notes (Signed)
2D Echo Performed 11/24/2013    Marygrace Drought, RCS

## 2013-11-26 ENCOUNTER — Encounter (INDEPENDENT_AMBULATORY_CARE_PROVIDER_SITE_OTHER): Payer: Self-pay

## 2013-11-26 ENCOUNTER — Ambulatory Visit (INDEPENDENT_AMBULATORY_CARE_PROVIDER_SITE_OTHER): Payer: Medicare Other | Admitting: Internal Medicine

## 2013-11-26 ENCOUNTER — Ambulatory Visit (INDEPENDENT_AMBULATORY_CARE_PROVIDER_SITE_OTHER)
Admission: RE | Admit: 2013-11-26 | Discharge: 2013-11-26 | Disposition: A | Discharge status: Home / Self Care | Payer: Medicare Other | Source: Ambulatory Visit | Attending: Internal Medicine | Admitting: Internal Medicine

## 2013-11-26 ENCOUNTER — Encounter: Payer: Self-pay | Admitting: Internal Medicine

## 2013-11-26 VITALS — BP 128/70 | HR 66 | Wt 275.8 lb

## 2013-11-26 DIAGNOSIS — I272 Pulmonary hypertension, unspecified: Secondary | ICD-10-CM

## 2013-11-26 DIAGNOSIS — J9611 Chronic respiratory failure with hypoxia: Secondary | ICD-10-CM

## 2013-11-26 DIAGNOSIS — R0609 Other forms of dyspnea: Secondary | ICD-10-CM

## 2013-11-26 DIAGNOSIS — J9610 Chronic respiratory failure, unspecified whether with hypoxia or hypercapnia: Secondary | ICD-10-CM

## 2013-11-26 DIAGNOSIS — I4891 Unspecified atrial fibrillation: Secondary | ICD-10-CM

## 2013-11-26 DIAGNOSIS — R0600 Dyspnea, unspecified: Secondary | ICD-10-CM

## 2013-11-26 DIAGNOSIS — I480 Paroxysmal atrial fibrillation: Secondary | ICD-10-CM

## 2013-11-26 DIAGNOSIS — I2789 Other specified pulmonary heart diseases: Secondary | ICD-10-CM

## 2013-11-26 DIAGNOSIS — R0989 Other specified symptoms and signs involving the circulatory and respiratory systems: Secondary | ICD-10-CM

## 2013-11-26 DIAGNOSIS — R0902 Hypoxemia: Secondary | ICD-10-CM

## 2013-11-26 DIAGNOSIS — G4733 Obstructive sleep apnea (adult) (pediatric): Secondary | ICD-10-CM

## 2013-11-26 NOTE — Progress Notes (Signed)
Quick Note:  Spoke with pt and notified of results per Dr. Annamaria Boots. Pt verbalized understanding and denied any questions.  ______

## 2013-11-26 NOTE — Progress Notes (Signed)
11/26/13- 80 yo F former smoker seen on kind referral from Dr Delray Alt beating fast and check up on CPAP. Former patient as well. Caregiver [name] is here Medical problems include obesity, hx PE/ coumadin, PHTN, PAF, HTN Originally dx'd OSA several years ago by Dr Jaynie Collins. Has been using CPAP 9/ and continuous O2 2L/ [facility]. Diagnostic NPSG being sought.  Bedtime is irregular, short sleep latency, waking only once for meds before up around 9AM. Sleeps very soundly after hydrocodone and lorazepam at bedtime.  Wakes with malaise and aware of heart racing often as she wakes, but not aware of this during the night. She is concerned, because she wakes like this, that there is something wrong with her CPAP or that it might fail. She insists she breathes and sleeps better with CPAP, used all night, every night. Caregiver says she does not snore while wearing it. No ENT surgery. Smoked 1ppd, quitting 1983. Unclear if dx'd lung disease. She is very limited by multiple medical problems and obesity, with little activity using a walker and portable O2. Hard cough/ scant white sputum w never blood or purulent. No wheeze. Treated at [facility] this summer for possible viral pneumonia with interstitial infiltrates.Taking Flovent HFA, Symbicort, ventolin HFA, Xopenex neb, but can't tell they do much.  Office Spirometry 11/26/13- severe restriction w FVC 1.43/ 49%, FEV1 0.95/ 45%, FEV1/FVC 0.66, FEF25-75% 0.52/33%, but poor technique with delayed exhalation so this may not be best capability.  Prior to Admission medications   Medication Sig Start Date End Date Taking? Authorizing Provider  albuterol (PROVENTIL HFA;VENTOLIN HFA) 108 (90 BASE) MCG/ACT inhaler Inhale 2 puffs into the lungs every 6 (six) hours as needed for wheezing.   Yes Historical Provider, MD  Ascorbic Acid (VITAMIN C PO) Take 600 mg by mouth daily.   Yes Historical Provider, MD  atenolol (TENORMIN) 25 MG tablet Take 1 tablet (25 mg total) by mouth  daily. 08/05/13  Yes Candee Furbish, MD  brimonidine-timolol (COMBIGAN) 0.2-0.5 % ophthalmic solution Place 1 drop into both eyes every 12 (twelve) hours.    Yes Historical Provider, MD  brinzolamide (AZOPT) 1 % ophthalmic suspension Place 1 drop into both eyes 2 (two) times daily.    Yes Historical Provider, MD  budesonide-formoterol (SYMBICORT) 160-4.5 MCG/ACT inhaler Inhale 2 puffs into the lungs 2 (two) times daily.   Yes Historical Provider, MD  diltiazem (CARDIZEM CD) 120 MG 24 hr capsule Take 1 capsule (120 mg total) by mouth daily. 08/05/13  Yes Candee Furbish, MD  fluticasone (FLOVENT HFA) 44 MCG/ACT inhaler Inhale 2 puffs into the lungs 2 (two) times daily.   Yes Historical Provider, MD  Homeopathic Products (ARNICARE ARNICA) CREA Apply topically daily.   Yes Historical Provider, MD  HYDROcodone-acetaminophen (NORCO) 7.5-325 MG per tablet Take 1 tablet by mouth every 6 (six) hours as needed.    Yes Historical Provider, MD  levalbuterol Penne Lash) 0.63 MG/3ML nebulizer solution Take 1 ampule by nebulization every 8 (eight) hours as needed for wheezing.   Yes Historical Provider, MD  LORazepam (ATIVAN) 1 MG tablet Take 1 mg by mouth every 4 (four) hours as needed for anxiety.    Yes Historical Provider, MD  MAGNESIUM PO Take by mouth daily.   Yes Historical Provider, MD  meclizine (ANTIVERT) 25 MG tablet Take 25 mg by mouth every 6 (six) hours as needed.     Yes Historical Provider, MD  Multiple Minerals (MINERAL COMPLEX PO) Take 1 tablet by mouth daily.    Yes Historical  Provider, MD  NON FORMULARY Place 3 L into the nose at bedtime. 3 liter as needed during day depending on extertion   Yes Historical Provider, MD  Nystatin ([place]) 100000 UNIT/GM POWD Apply topically.   Yes Historical Provider, MD  OLIVE LEAF EXTRACT PO Take 450 mg by mouth daily.   Yes Historical Provider, MD  Turmeric 500 MG CAPS Take 500 mg by mouth daily as needed.   Yes Historical Provider, MD  Warfarin Sodium (JANTOVEN PO) Take  2.5 mg by mouth daily.   Yes Historical Provider, MD   Past Medical History  Diagnosis Date  . Obesities, morbid   . Skin cancer   . Cataract   . Sleep apnea   . Osteoporosis   . Arthritis   . Vertigo   . Blood clotting disorder     In Lungs  . Endometriosis   . A-fib   . Anticoagulated on Coumadin   . Psoriasis    Past Surgical History  Procedure Laterality Date  . Oophorectomy    . Nose surgery    . Skin surgery    . Gallbladder surgery    . Uvulopalatopharyngoplasty    . Cataract surgery     History reviewed. No pertinent family history. History   Social History  . Marital Status: Widowed    Spouse Name: N/A    Number of Children: N/A  . Years of Education: N/A   Occupational History  . Not on file.   Social History Main Topics  . Smoking status: Former Smoker -- 1.00 packs/day for 2 years    Types: Cigarettes    Quit date: 04/03/1981  . Smokeless tobacco: Never Used  . Alcohol Use: No  . Drug Use: No  . Sexual Activity: Not Currently   Other Topics Concern  . Not on file   Social History Narrative  . No narrative on file   ROS-see HPI Constitutional:   No-   weight loss, night sweats, fevers, chills, fatigue, lassitude. HEENT:   No-  headaches, difficulty swallowing, tooth/dental problems, sore throat,       No-  sneezing, itching, ear ache, nasal congestion, post nasal drip,  CV:  No-   chest pain, orthopnea, PND, swelling in lower extremities, anasarca,                                  dizziness, +palpitations Resp: +shortness of breath with exertion or at rest.              No-   productive cough,  No non-productive cough,  No- coughing up of blood.              No-   change in color of mucus.  No- wheezing.   Skin: No-   rash or lesions. GI:  No-   heartburn, indigestion, abdominal pain, nausea, vomiting, diarrhea,                 change in bowel habits, loss of appetite GU: No-   dysuria, change in color of urine, no urgency or frequency.  No-  flank pain. MS:  No-   joint pain or swelling.  No- decreased range of motion.  No- back pain. Neuro-     nothing unusual Psych:  No- change in mood or affect. No depression or anxiety.  No memory loss.  OBJ- Physical Exam   Cheerful, talkative General- Alert, Oriented, Affect-appropriate,  Distress- none acute, obese, walker, O2 2L/96% Skin- rash-none, lesions- none, excoriation- none Lymphadenopathy- none Head- atraumatic            Eyes- Gross vision intact, PERRLA, conjunctivae and secretions clear            Ears- Hearing, canals-normal            Nose- Clear, no-Septal dev, mucus, polyps, erosion, perforation             Throat- Mallampati II , mucosa clear , drainage- none, tonsils- atrophic Neck- flexible , trachea midline, no stridor , thyroid nl, carotid no bruit Chest - symmetrical excursion , unlabored           Heart/CV- RRR , no murmur , no gallop  , no rub, nl s1 s2                           - JVD- none , edema- none, stasis changes- none, varices- none           Lung- clear to P&A, wheeze- none, cough- none , dullness-none, rub- none           Chest wall- +Heart monitor leads Abd- tender-no, distended-no, bowel sounds-present, HSM- no Br/ Gen/ Rectal- Not done, not indicated Extrem- cyanosis- none, clubbing, none, atrophy- none, strength- nl, elastic hose Neuro- grossly intact to observation

## 2013-11-26 NOTE — Assessment & Plan Note (Signed)
Irregular sleep schedule, using hydrocodone and lorazepam for sound sleep. Her concern is that malaise and rapid heart beat on waking may mean that her machine is failing. Discussed. We are seeking her diagnostic sleep study, otherwise may have to do a new one eventually. Some of her morning malaise may reflect stress of waking from drugged sleep.  Plan- Download CPAP for presure compliance data. Overnight oximetry on CPAP plus O2.

## 2013-11-26 NOTE — Assessment & Plan Note (Signed)
Plan- first check O2 sat during sleep, on O2 2l with CPAP

## 2013-11-26 NOTE — Assessment & Plan Note (Signed)
Former smoker, obese, PAF, hx Pulmonary embolism. Not clear if she has meaningful obstructive airways disease. We may need to get formal PFT with lung volumes. Probable component of cardiac dyspnea and obesity-hypoventilation.

## 2013-11-26 NOTE — Assessment & Plan Note (Signed)
Managed by cardiology 

## 2013-11-26 NOTE — Patient Instructions (Signed)
Order- DME [facility]- download CPAP for pressure compliance  Order- office spirometry   Dx dyspnea  Order- Overnight oximetry on CPAP, on O2 2L   Dx OSA, chronic hypoxic respiratory failure,   Order- CXR  Dx dyspnea, chronic hypoxic respiratory failure

## 2013-12-03 ENCOUNTER — Telehealth: Payer: Self-pay | Admitting: Cardiovascular Disease

## 2013-12-03 NOTE — Telephone Encounter (Signed)
Pt. States she has a lot of chest soreness; no chest pain and says shes really tired and feels like she wants to cry about everything, pt. Encouraged to go to ER if this doesn't feel better or if it gets worse , pt. Stated undrstanding

## 2013-12-03 NOTE — Telephone Encounter (Signed)
Pt called in stating that she had a sleep study done last night and said that her HR was 98 when she woke up this morning and her chest area is very sore. She also expressed that she is having some trimmers. Please call  Thanks

## 2013-12-04 ENCOUNTER — Encounter: Payer: Self-pay | Admitting: Cardiology

## 2013-12-04 ENCOUNTER — Ambulatory Visit (INDEPENDENT_AMBULATORY_CARE_PROVIDER_SITE_OTHER): Payer: Medicare Other | Admitting: Cardiology

## 2013-12-04 VITALS — BP 165/74 | HR 82 | Ht 66.0 in | Wt 269.0 lb

## 2013-12-04 DIAGNOSIS — G4733 Obstructive sleep apnea (adult) (pediatric): Secondary | ICD-10-CM

## 2013-12-04 DIAGNOSIS — Z7901 Long term (current) use of anticoagulants: Secondary | ICD-10-CM

## 2013-12-04 DIAGNOSIS — J9610 Chronic respiratory failure, unspecified whether with hypoxia or hypercapnia: Secondary | ICD-10-CM

## 2013-12-04 DIAGNOSIS — J9611 Chronic respiratory failure with hypoxia: Secondary | ICD-10-CM

## 2013-12-04 DIAGNOSIS — I4891 Unspecified atrial fibrillation: Secondary | ICD-10-CM

## 2013-12-04 DIAGNOSIS — Z5181 Encounter for therapeutic drug level monitoring: Secondary | ICD-10-CM

## 2013-12-04 DIAGNOSIS — R0902 Hypoxemia: Secondary | ICD-10-CM

## 2013-12-04 DIAGNOSIS — I10 Essential (primary) hypertension: Secondary | ICD-10-CM

## 2013-12-04 DIAGNOSIS — I272 Pulmonary hypertension, unspecified: Secondary | ICD-10-CM

## 2013-12-04 DIAGNOSIS — I480 Paroxysmal atrial fibrillation: Secondary | ICD-10-CM

## 2013-12-04 DIAGNOSIS — I2789 Other specified pulmonary heart diseases: Secondary | ICD-10-CM

## 2013-12-04 MED ORDER — NEBIVOLOL HCL 10 MG PO TABS: 10.0000 mg | ORAL_TABLET | Freq: Every day | ORAL | Status: DC

## 2013-12-04 NOTE — Assessment & Plan Note (Signed)
Jacqueline Nguyen

## 2013-12-04 NOTE — Assessment & Plan Note (Signed)
Controlled

## 2013-12-04 NOTE — Assessment & Plan Note (Signed)
Monitor shows NSR with PACs

## 2013-12-04 NOTE — Patient Instructions (Addendum)
STOP your Atenolol  Begin Bystolic 36RW Daily before bed  Keep your follow up appointment with Dr. Sallyanne Kuster

## 2013-12-04 NOTE — Assessment & Plan Note (Signed)
Noted on 11/24/13 echo

## 2013-12-04 NOTE — Assessment & Plan Note (Signed)
Apparently recent overnight pulse oximetry showed hypoxia despite oxygen use and C-Pap

## 2013-12-04 NOTE — Assessment & Plan Note (Signed)
BMI 43, she is wheel chair dependent

## 2013-12-04 NOTE — Progress Notes (Signed)
12/04/2013 Jacqueline Nguyen   03/08/1932  [id number]  Primary [place], PA-C Primary Cardiologist: Dr Sallyanne Kuster  HPI:  Jacqueline Nguyen is an obese 80 y/o fdemale previously followed by previously saw Dr. Renea Ee Dr Acie Fredrickson. She now sees Dr ArvinMeritor. She is cousin with Kathleen Lime, who is also Dr Croitoru's patient.        In 2009 she was found to have a "massive" bilateral pulmonary embolism by Dr Rayford Halsted. She had been having dyspnea "for months" previously. She has long-standing systemic hypertension, morbid obesity, reactive airway disease, obstructive sleep apnea (on CPAP) and osteoporosis and degenerative arthritis. She wears oxygen at night and with activity and often uses a scooter. She has seen Dr Annamaria Boots for pulmonary issues.        Her major complaint is arrhythmia. She believes that she is "out of rhythm" daily and reports heart rates to the 130-140 with her pulse oximeter. Sometimes the arrhythmia wakes her from sleep at night. When it occurs, she has a "funny sensation" in her head her legs "feel like noodles" and she "can't think straight". She has been documented to have atrial fibrillation in the past. She takes a combination of low-dose beta blocker and low-dose diltiazem, but also takes inhaled short and long-acting bronchodilators. She saw Dr Sallyanne Kuster 11/22/13 and had an echo and monitor placed. The echo showed good LVF and pulm HTN with PA pressures of 40 mmHg. In the office today she continues to complain of tachycardia when she wakes up. I review about 50 pages of tracings. She has PACs and artifact but I found no convincing AF with RVR.      Current Outpatient Prescriptions  Medication Sig Dispense Refill  . albuterol (PROVENTIL HFA;VENTOLIN HFA) 108 (90 BASE) MCG/ACT inhaler Inhale 2 puffs into the lungs every 6 (six) hours as needed for wheezing.      . Ascorbic Acid (VITAMIN C PO) Take 600 mg by mouth daily.      . brimonidine-timolol (COMBIGAN) 0.2-0.5 % ophthalmic  solution Place 1 drop into both eyes every 12 (twelve) hours.       . brinzolamide (AZOPT) 1 % ophthalmic suspension Place 1 drop into both eyes 2 (two) times daily.       . budesonide-formoterol (SYMBICORT) 160-4.5 MCG/ACT inhaler Inhale 2 puffs into the lungs 2 (two) times daily.      Marland Kitchen diltiazem (CARDIZEM CD) 120 MG 24 hr capsule Take 1 capsule (120 mg total) by mouth daily.  90 capsule  3  . fluticasone (FLOVENT HFA) 44 MCG/ACT inhaler Inhale 2 puffs into the lungs 2 (two) times daily.      . Homeopathic Products (ARNICARE ARNICA) CREA Apply topically daily.      Marland Kitchen HYDROcodone-acetaminophen (NORCO) 7.5-325 MG per tablet Take 1 tablet by mouth every 6 (six) hours as needed.       . levalbuterol (XOPENEX) 0.63 MG/3ML nebulizer solution Take 1 ampule by nebulization every 8 (eight) hours as needed for wheezing.      Marland Kitchen LORazepam (ATIVAN) 1 MG tablet Take 1 mg by mouth every 4 (four) hours as needed for anxiety.       Marland Kitchen MAGNESIUM PO Take by mouth daily.      . meclizine (ANTIVERT) 25 MG tablet Take 25 mg by mouth every 6 (six) hours as needed.        . Multiple Minerals (MINERAL COMPLEX PO) Take 1 tablet by mouth daily.       . NON FORMULARY Place 3  L into the nose at bedtime. 3 liter as needed during day depending on extertion      . Nystatin (Fabens) 100000 UNIT/GM POWD Apply topically.      Marland Kitchen OLIVE LEAF EXTRACT PO Take 450 mg by mouth daily.      . Turmeric 500 MG CAPS Take 500 mg by mouth daily as needed.      . Warfarin Sodium (JANTOVEN PO) Take 2.5 mg by mouth daily.      . nebivolol (BYSTOLIC) 10 MG tablet Take 1 tablet (10 mg total) by mouth daily.  28 tablet  0   No current facility-administered medications for this visit.    Allergies  Allergen Reactions  . Ciprofloxacin   . Bactrim Rash  . Codeine Nausea Only    History   Social History  . Marital Status: Widowed    Spouse Name: N/A    Number of Children: N/A  . Years of Education: N/A   Occupational History  . Not on  file.   Social History Main Topics  . Smoking status: Former Smoker -- 1.00 packs/day for 2 years    Types: Cigarettes    Quit date: 04/03/1981  . Smokeless tobacco: Never Used  . Alcohol Use: No  . Drug Use: No  . Sexual Activity: Not Currently   Other Topics Concern  . Not on file   Social History Narrative  . No narrative on file     Review of Systems: General: negative for chills, fever, night sweats or weight changes.  Cardiovascular: negative for chest pain, dyspnea on exertion, edema, orthopnea, palpitations, paroxysmal nocturnal dyspnea or shortness of breath Dermatological: negative for rash Respiratory: negative for cough or wheezing Urologic: negative for hematuria Abdominal: negative for nausea, vomiting, diarrhea, bright red blood per rectum, melena, or hematemesis Neurologic: negative for visual changes, syncope, or dizziness All other systems reviewed and are otherwise negative except as noted above.    Blood pressure 165/74, pulse 82, height _0  (1.676 m), weight 269 lb (122.018 kg).  General appearance: alert, cooperative, no distress and morbidly obese Lungs: clear to auscultation bilaterally Heart: regular rate and rhythm   ASSESSMENT AND PLAN:   PAF (paroxysmal atrial fibrillation) Monitor shows NSR with PACs  Pulmonary hypertension Noted on 11/24/13 echo  Obstructive sleep apnea On C-pap with O2  Obesities, morbid BMI 43, she is wheel chair dependent  HTN (hypertension) Controlled  Chronic respiratory failure Apparently recent overnight pulse oximetry showed hypoxia despite oxygen use and C-Pap  Anticoagulated on Coumadin .   PLAN  I reviewed Jacqueline Nguyen with Dr Sallyanne Kuster. The pt is frustrated that she is having symptoms we cannot document. I suggested we try stopping her Atenolol and trying Bystolic 10 mg QHS. She is agreeable to this. I asked her to contact Dr Janee Morn office about her hypoxia at night- ? Does she need Bipap. She has a  follow up with Dr Sallyanne Kuster in a few weeks.   Ace Bergfeld KPA-C 12/04/2013 5:10 PM

## 2013-12-04 NOTE — Telephone Encounter (Signed)
Closed encounter

## 2013-12-04 NOTE — Assessment & Plan Note (Signed)
On C-pap with O2

## 2013-12-09 ENCOUNTER — Telehealth: Payer: Self-pay | Admitting: Internal Medicine

## 2013-12-09 DIAGNOSIS — J9611 Chronic respiratory failure with hypoxia: Secondary | ICD-10-CM

## 2013-12-09 DIAGNOSIS — G4733 Obstructive sleep apnea (adult) (pediatric): Secondary | ICD-10-CM

## 2013-12-09 NOTE — Telephone Encounter (Signed)
Do not see any ONO results in pt chart. Please advise katie/CDY if this has been received? Thanks

## 2013-12-09 NOTE — Telephone Encounter (Signed)
Pt returning call.Jacqueline Nguyen

## 2013-12-09 NOTE — Telephone Encounter (Signed)
I spoke with patient about results and she verbalized understanding and had no questions Order placed

## 2013-12-09 NOTE — Telephone Encounter (Signed)
Per CY-he reviewed the ONO results and states that probably ok but consider Increasing O2 to 3L WITH CPAP. Her CPAP compliance shows she is using her CPAP as she should. Keep up the good work.

## 2013-12-09 NOTE — Telephone Encounter (Signed)
LMTCB for pt.   Per Kyle wanted to increase 3L with CPAP.

## 2013-12-25 ENCOUNTER — Telehealth: Payer: Self-pay | Admitting: *Deleted

## 2013-12-25 NOTE — Telephone Encounter (Signed)
LM with Event Monitor results.  Instructed to call if any questions ( No A. Fib, isolated PAC's symptomatic, rare PVC's and very rare brief nonsustained atrial tachycardia.)

## 2013-12-26 ENCOUNTER — Telehealth: Payer: Self-pay | Admitting: Internal Medicine

## 2013-12-26 NOTE — Telephone Encounter (Signed)
Message and forms given to CY to complete.

## 2013-12-30 NOTE — Telephone Encounter (Signed)
Katie, please advise on status of forms. Thanks.

## 2013-12-30 NOTE — Telephone Encounter (Signed)
Jacqueline Nguyen has forms on his cart to complete.

## 2014-01-01 NOTE — Telephone Encounter (Signed)
Forms completed by CY and I have contacted [facility] to get download compliance on patients CPAP usage to include in forms. No RT at [facility] at this time today and will need to fax to me in the morning to the triage fax number.

## 2014-01-05 NOTE — Telephone Encounter (Signed)
I have contact [employer] again this morning as I have not received a CPAP download on patient as of this morning. Was told by [employer] that she is with the [facility]-they are e-mailing that office to fax CPAP download to my attn to Triage fax number.

## 2014-01-12 ENCOUNTER — Ambulatory Visit (INDEPENDENT_AMBULATORY_CARE_PROVIDER_SITE_OTHER): Payer: Medicare Other | Admitting: Cardiovascular Disease

## 2014-01-12 ENCOUNTER — Encounter: Payer: Self-pay | Admitting: Cardiovascular Disease

## 2014-01-12 VITALS — BP 126/59 | HR 65 | Ht 66.0 in | Wt 282.8 lb

## 2014-01-12 DIAGNOSIS — I272 Pulmonary hypertension, unspecified: Secondary | ICD-10-CM

## 2014-01-12 DIAGNOSIS — J9611 Chronic respiratory failure with hypoxia: Secondary | ICD-10-CM

## 2014-01-12 DIAGNOSIS — I480 Paroxysmal atrial fibrillation: Secondary | ICD-10-CM

## 2014-01-12 DIAGNOSIS — G4733 Obstructive sleep apnea (adult) (pediatric): Secondary | ICD-10-CM

## 2014-01-12 DIAGNOSIS — I270 Primary pulmonary hypertension: Secondary | ICD-10-CM

## 2014-01-12 DIAGNOSIS — I10 Essential (primary) hypertension: Secondary | ICD-10-CM

## 2014-01-12 MED ORDER — DRONEDARONE HCL 400 MG PO TABS: 400.0000 mg | ORAL_TABLET | Freq: Two times a day (BID) | ORAL | Status: DC

## 2014-01-12 NOTE — Progress Notes (Signed)
Patient ID: Jacqueline Nguyen, female   DOB: 10/18/1931, 80 y.o.   MRN: [id number]     Reason for office visit Palpitations  Mrs. Thaker continues to be troubled by palpitations. These frequently occur when she is waking up in the morning. Heart feels like it is racing and sometimes accompanies chest pressure. Her symptoms were particularly bad last night. They woke her from sleep and only improved roughly 45 minutes after taking an extra quarter tablet of atenolol. She no longer has any chest discomfort but feels "washed out".  Overnight monitoring has demonstrated recurrent problems with nocturnal desaturation and Dr. Annamaria Boots has recommended an increase in her oxygen supplement at night to 3 L per minute.  In 2009 she had a "massive" bilateral pulmonary embolism and she has long-standing systemic hypertension, morbid obesity, reactive airway disease, obstructive sleep apnea (on CPAP) and osteoporosis and degenerative arthritis. She wears oxygen at night and with activity and often uses a scooter. In 2014 she had transient right eye blindness, not long after cataract surgery. Has glaucoma She attributed it to treatment with Mobic. About 6 months ago she had pneumonia from which she recovered very slowly. Echo in August 2015 showed normal left ventricular regional wall motion and EF 65%, no significant valvular abnormalities, estimated systolic PA pressure 40 mm Hg. Her event monitor showed frequent PACs and brief bursts of nonsustained atrial tachycardia, but there was no confirmed atrial fibrillation.  Allergies  Allergen Reactions  . Ciprofloxacin   . Bactrim Rash  . Codeine Nausea Only    Current Outpatient Prescriptions  Medication Sig Dispense Refill  . albuterol (PROVENTIL HFA;VENTOLIN HFA) 108 (90 BASE) MCG/ACT inhaler Inhale 2 puffs into the lungs every 6 (six) hours as needed for wheezing.      . Ascorbic Acid (VITAMIN C PO) Take 600 mg by mouth daily.      Marland Kitchen atenolol (TENORMIN) 25 MG tablet  Take 25 mg by mouth daily.      . brimonidine-timolol (COMBIGAN) 0.2-0.5 % ophthalmic solution Place 1 drop into both eyes every 12 (twelve) hours.       . brinzolamide (AZOPT) 1 % ophthalmic suspension Place 1 drop into both eyes 2 (two) times daily.       . budesonide-formoterol (SYMBICORT) 160-4.5 MCG/ACT inhaler Inhale 2 puffs into the lungs 2 (two) times daily.      Marland Kitchen diltiazem (CARDIZEM CD) 120 MG 24 hr capsule Take 1 capsule (120 mg total) by mouth daily.  90 capsule  3  . fluticasone (FLOVENT HFA) 44 MCG/ACT inhaler Inhale 2 puffs into the lungs 2 (two) times daily.      . Homeopathic Products (ARNICARE ARNICA) CREA Apply topically daily.      Marland Kitchen HYDROcodone-acetaminophen (NORCO) 7.5-325 MG per tablet Take 1 tablet by mouth every 6 (six) hours as needed.       . levalbuterol (XOPENEX) 0.63 MG/3ML nebulizer solution Take 1 ampule by nebulization every 8 (eight) hours as needed for wheezing.      Marland Kitchen LORazepam (ATIVAN) 1 MG tablet Take 1 mg by mouth every 4 (four) hours as needed for anxiety.       Marland Kitchen MAGNESIUM PO Take by mouth daily.      . meclizine (ANTIVERT) 25 MG tablet Take 25 mg by mouth every 6 (six) hours as needed.        . Multiple Minerals (MINERAL COMPLEX PO) Take 1 tablet by mouth daily.       . NON FORMULARY Place 3 L into  the nose at bedtime. 3 liter as needed during day depending on extertion      . Nystatin ([place]) 100000 UNIT/GM POWD Apply topically.      Marland Kitchen OLIVE LEAF EXTRACT PO Take 450 mg by mouth daily.      . Turmeric 500 MG CAPS Take 500 mg by mouth daily as needed.      . Warfarin Sodium (JANTOVEN PO) Take 2.5 mg by mouth daily.       No current facility-administered medications for this visit.    Past Medical History  Diagnosis Date  . Obesities, morbid   . Skin cancer   . Cataract   . Sleep apnea   . Osteoporosis   . Arthritis   . Vertigo   . Blood clotting disorder     In Lungs  . Endometriosis   . A-fib   . Anticoagulated on Coumadin   . Psoriasis       Past Surgical History  Procedure Laterality Date  . Oophorectomy    . Nose surgery    . Skin surgery    . Gallbladder surgery    . Uvulopalatopharyngoplasty    . Cataract surgery      No family history on file.  History   Social History  . Marital Status: Widowed    Spouse Name: N/A    Number of Children: N/A  . Years of Education: N/A   Occupational History  . Not on file.   Social History Main Topics  . Smoking status: Former Smoker -- 1.00 packs/day for 2 years    Types: Cigarettes    Quit date: 04/03/1981  . Smokeless tobacco: Never Used  . Alcohol Use: No  . Drug Use: No  . Sexual Activity: Not Currently   Other Topics Concern  . Not on file   Social History Narrative  . No narrative on file    Review of systems: She complains of palpitations, occasional dizziness, chronic shortness of breath, intermittent lower extremity edema. She does not have chest pain. She has occasional wheezing but currently does not have cough or hemoptysis. She has never experienced a full-blown syncope.  The patient also denies abdominal pain, nausea, vomiting, dysphagia, diarrhea, constipation, polyuria, polydipsia, dysuria, hematuria, frequency, urgency, abnormal bleeding or bruising, fever, chills, unexpected weight changes, mood swings, change in skin or hair texture, change in voice quality, auditory or visual problems, allergic reactions or rashes, new musculoskeletal complaints other than usual "aches and pains".   PHYSICAL EXAM BP 126/59  Pulse 65  Ht _0  (1.676 m)  Wt 128.277 kg (282 lb 12.8 oz)  BMI 45.67 kg/m2 Morbid obesity limits the physical exam  General: Alert, oriented x3, no distress  Head: no evidence of trauma, PERRL, EOMI, no exophtalmos or lid lag, no myxedema, no xanthelasma; normal ears, nose and oropharynx  Neck: jugular venous pulsations are difficult to see; brisk carotid pulses without delay and no carotid bruits  Chest: clear to auscultation,  no signs of consolidation by percussion or palpation, normal fremitus, symmetrical and full respiratory excursions  Cardiovascular: Unable to locate the apical impulse, regular rhythm, normal first and loud second heart sounds, no murmurs, rubs or gallops  Abdomen: no tenderness or distention, no masses by palpation, no abnormal pulsatility or arterial bruits, normal bowel sounds, no hepatosplenomegaly  Extremities: no clubbing, cyanosis or edema; 2+ radial, ulnar and brachial pulses bilaterally; 2+ right femoral, posterior tibial and dorsalis pedis pulses; 2+ left femoral, posterior tibial and dorsalis pedis pulses;  no subclavian or femoral bruits  Neurological: grossly nonfocal   EKG: Sinus rhythm, generalized low voltage related to obesity, slight delay in anterior R wave progression  Lipid Panel     Component Value Date/Time   CHOL 205* 08/30/2012 0550   TRIG 102 08/30/2012 0550   HDL 55 08/30/2012 0550   CHOLHDL 3.7 08/30/2012 0550   VLDL 20 08/30/2012 0550   LDLCALC 130* 08/30/2012 0550    BMET    Component Value Date/Time   NA 139 08/20/2013 1556   K 4.2 08/20/2013 1556   CL 102 08/20/2013 1556   CO2 30 08/20/2013 1556   GLUCOSE 103* 08/20/2013 1556   BUN 9 08/20/2013 1556   CREATININE 0.7 08/20/2013 1556   CALCIUM 9.3 08/20/2013 1556   GFRNONAA 81* 08/29/2012 0910   GFRAA >90 08/29/2012 0910     ASSESSMENT AND PLAN   Mrs. Libman remains very symptomatic from supraventricular arrhythmia. It is quite possible that she is having some brief episodes of paroxysmal atrial fibrillation that we have not been able to catch.  Treatment with beta blockers and calcium channel blockers has been limited by relatively low blood pressure (often at home her systolic blood pressure is 99).  For this reason I don't think bystolic is a good alternative to atenolol, since it will be expected to cause even more hypotension for the same degree of rate slowing.  We discussed antiarrhythmic medications.  She's not interested in sotalol or dofetilide which will require hospitalization for 72 hours and I think amiodarone would be a bad choice for her since the roots of her arrhythmia is likely to be lung disease, which could be worsened by amiodarone. I think it is reasonable to try at least a short course of multaq for symptom relief. Have asked her to make sure she takes this medication with food. Also recommended that she take her atenolol in the evening, since most of her events happen early in the morning. She is to call me if her heart rate decreases to the low 50s or she has symptoms of bradycardia.  Hopefully the increased dose of nighttime oxygen will help prevent episodes of desaturation, which could be the triggers for her arrhythmic events.  Meds ordered this encounter  Medications  . atenolol (TENORMIN) 25 MG tablet    Sig: Take 25 mg by mouth daily.  Marland Kitchen dronedarone (MULTAQ) 400 MG tablet    Sig: Take 1 tablet (400 mg total) by mouth 2 (two) times daily with a meal.    Dispense:  60 tablet    Refill:  [place] Kripa Foskey, MD, [facility] [phone] office [phone] pager

## 2014-01-12 NOTE — Patient Instructions (Signed)
RESTART Atenolol 66m daily in the evening.  START Multaq 4035mtwice a day with meals.  Your physician recommends that you schedule a follow-up appointment in: 3 weeks with an extender.  Dr. CrSallyanne Kusterecommends that you schedule a follow-up appointment in: 6-8 weeks.

## 2014-01-13 ENCOUNTER — Encounter: Payer: Self-pay | Admitting: Internal Medicine

## 2014-01-13 ENCOUNTER — Other Ambulatory Visit (INDEPENDENT_AMBULATORY_CARE_PROVIDER_SITE_OTHER): Payer: Medicare Other

## 2014-01-13 ENCOUNTER — Ambulatory Visit (INDEPENDENT_AMBULATORY_CARE_PROVIDER_SITE_OTHER): Payer: Medicare Other | Admitting: Internal Medicine

## 2014-01-13 VITALS — BP 120/76 | HR 66 | Wt 285.6 lb

## 2014-01-13 DIAGNOSIS — I2782 Chronic pulmonary embolism: Secondary | ICD-10-CM

## 2014-01-13 DIAGNOSIS — J9611 Chronic respiratory failure with hypoxia: Secondary | ICD-10-CM

## 2014-01-13 DIAGNOSIS — G4733 Obstructive sleep apnea (adult) (pediatric): Secondary | ICD-10-CM

## 2014-01-13 DIAGNOSIS — I272 Pulmonary hypertension, unspecified: Secondary | ICD-10-CM

## 2014-01-13 DIAGNOSIS — I270 Primary pulmonary hypertension: Secondary | ICD-10-CM

## 2014-01-13 LAB — BASIC METABOLIC PANEL
BUN: 10 mg/dL (ref 6–23)
CHLORIDE: 102 meq/L (ref 96–112)
CO2: 32 meq/L (ref 19–32)
CREATININE: 0.7 mg/dL (ref 0.4–1.2)
Calcium: 9.3 mg/dL (ref 8.4–10.5)
GFR: 81.49 mL/min (ref >60.00)
Glucose, Bld: 93 mg/dL (ref 70–99)
Potassium: 4.1 mEq/L (ref 3.5–5.1)
Sodium: 138 mEq/L (ref 135–145)

## 2014-01-13 NOTE — Progress Notes (Signed)
11/26/13- 80 yo F former smoker seen on kind referral from Dr Delray Alt beating fast and check up on CPAP. Former patient as well. Caregiver Jacqueline Nguyen is here Medical problems include obesity, hx PE/ coumadin, PHTN, PAF, HTN Originally dx'd OSA several years ago by Dr Jaynie Collins. Has been using CPAP 9/ and continuous O2 2L/ [facility]. Diagnostic NPSG being sought.  Bedtime is irregular, short sleep latency, waking only once for meds before up around 9AM. Sleeps very soundly after hydrocodone and lorazepam at bedtime.  Wakes with malaise and aware of heart racing often as she wakes, but not aware of this during the night. She is concerned, because she wakes like this, that there is something wrong with her CPAP or that it might fail. She insists she breathes and sleeps better with CPAP, used all night, every night. Caregiver says she does not snore while wearing it. No ENT surgery. Smoked 1ppd, quitting 1983. Unclear if dx'd lung disease. She is very limited by multiple medical problems and obesity, with little activity using a walker and portable O2. Hard cough/ scant white sputum w never blood or purulent. No wheeze. Treated at [place] this summer for possible viral pneumonia with interstitial infiltrates.Taking Flovent HFA, Symbicort, ventolin HFA, Xopenex neb, but can't tell they do much.  Office Spirometry 11/26/13- severe restriction w FVC 1.43/ 49%, FEV1 0.95/ 45%, FEV1/FVC 0.66, FEF25-75% 0.52/33%, but poor technique with delayed exhalation so this may not be best capability.  01/13/14- 38 yo F former smoker followed for OSA, Chronic resp failure, complicated by PAF, PHTN/ coumadin, morbid obesity   Caregiver Jacqueline Nguyen is here FOLLOWS FOR: wears CPAP 9/ [facility] machine; DME is [facility]. Pt continues to wear her O2 2L with activity and at night. Download confirms good CPAP compliance and control. Feels bad as she first wakes up with rapid heart rate "80-90" sleeps in a recliner, tilted back. CXR  11/26/13 IMPRESSION:  Underlying chronic interstitial coarsening with mild vascular  congestion.  Electronically Signed  By: Misty Stanley M.D.  On: 11/26/2013 13:35  ROS-see HPI Constitutional:   No-   weight loss, night sweats, fevers, chills, fatigue, lassitude. HEENT:   No-  headaches, difficulty swallowing, tooth/dental problems, sore throat,       No-  sneezing, itching, ear ache, nasal congestion, post nasal drip,  CV:  No-   chest pain, orthopnea, PND, swelling in lower extremities, anasarca,                                                      dizziness, +palpitations Resp: +shortness of breath with exertion or at rest.              No-   productive cough,  No non-productive cough,  No- coughing up of blood.              No-   change in color of mucus.  No- wheezing.   Skin: No-   rash or lesions. GI:  No-   heartburn, indigestion, abdominal pain, nausea, vomiting,  GU:  MS:  No-   joint pain or swelling.  . Neuro-     nothing unusual Psych:  No- change in mood or affect. No depression or anxiety.  No memory loss.  OBJ- Physical Exam   Talkative, + morbidly obese General- Alert, Oriented, Affect-appropriate, Distress- none  acute,  walker, O2 2L/97% Skin- rash-none, lesions- none, excoriation- none Lymphadenopathy- none Head- atraumatic            Eyes- Gross vision intact, PERRLA, conjunctivae and secretions clear            Ears- Hearing, canals-normal            Nose- Clear, no-Septal dev, mucus, polyps, erosion, perforation             Throat- Mallampati II , mucosa clear , drainage- none, tonsils- atrophic Neck- flexible , trachea midline, no stridor , thyroid nl, carotid no bruit Chest - symmetrical excursion , unlabored           Heart/CV- RRR , no murmur , no gallop  , no rub, nl s1 s2                           - JVD- none , edema- none, stasis changes- none, varices- none           Lung- clear to P&A, wheeze- none, cough- none , dullness-none, rub- none            Chest wall-  Abd-  Br/ Gen/ Rectal- Not done, not indicated Extrem- cyanosis- none, clubbing, none, atrophy- none, strength- nl, elastic hose Neuro- grossly intact to observation

## 2014-01-13 NOTE — Telephone Encounter (Signed)
All forms have been completed by CY; information from [place] received and faxed to [facility] and [place] as needed. Pt came in for appt today and has originals to take home with her. Copies made for our records and sent to HIM to scan in EPIC. Nothing more needed at this time.

## 2014-01-13 NOTE — Assessment & Plan Note (Signed)
The malaise she describes on waking is associated with a pounding heartbeat but her oximeter only indicates a rate 80-90/ min Plan-overnight oximetry on 3 L oxygen plus CPAP

## 2014-01-13 NOTE — Assessment & Plan Note (Signed)
Question possibility of chronic pulmonary embolism Plan-CT angiogram of chest, overnight oximetry on CPAP at +3 L oxygen

## 2014-01-13 NOTE — Assessment & Plan Note (Signed)
Good compliance and control verified by download

## 2014-01-13 NOTE — Patient Instructions (Signed)
Order- DME [facility] ONOX on 3L O2 with CPAP      Dx Chronic hypoxic respiratory failure, OSA  Order- Schedule CT angio chest   Dx chronic pulmonary embolism

## 2014-01-13 NOTE — Assessment & Plan Note (Signed)
Body habitus suggests obesity hypoventilation is likely

## 2014-01-14 ENCOUNTER — Ambulatory Visit (INDEPENDENT_AMBULATORY_CARE_PROVIDER_SITE_OTHER)
Admission: RE | Admit: 2014-01-14 | Discharge: 2014-01-14 | Disposition: A | Discharge status: Home / Self Care | Payer: Medicare Other | Source: Ambulatory Visit | Attending: Internal Medicine | Admitting: Internal Medicine

## 2014-01-14 DIAGNOSIS — I2782 Chronic pulmonary embolism: Secondary | ICD-10-CM

## 2014-01-14 MED ORDER — IOHEXOL 350 MG/ML SOLN
80.0000 mL | Freq: Once | INTRAVENOUS | Status: AC | PRN
  Administered 2014-01-14: 80 mL via INTRAVENOUS

## 2014-01-28 ENCOUNTER — Telehealth: Payer: Self-pay | Admitting: Internal Medicine

## 2014-01-28 DIAGNOSIS — G4733 Obstructive sleep apnea (adult) (pediatric): Secondary | ICD-10-CM

## 2014-01-28 NOTE — Telephone Encounter (Signed)
Pt is calling to get the results of her sleep study.  CY please advise. Thanks  Allergies  Allergen Reactions  . Ciprofloxacin   . Bactrim Rash  . Codeine Nausea Only    Current Outpatient Prescriptions on File Prior to Visit  Medication Sig Dispense Refill  . albuterol (PROVENTIL HFA;VENTOLIN HFA) 108 (90 BASE) MCG/ACT inhaler Inhale 2 puffs into the lungs every 6 (six) hours as needed for wheezing.      . Ascorbic Acid (VITAMIN C PO) Take 600 mg by mouth daily.      Marland Kitchen atenolol (TENORMIN) 25 MG tablet Take 25 mg by mouth 2 (two) times daily.       . brimonidine-timolol (COMBIGAN) 0.2-0.5 % ophthalmic solution Place 1 drop into both eyes every 12 (twelve) hours.       . brinzolamide (AZOPT) 1 % ophthalmic suspension Place 1 drop into both eyes 2 (two) times daily.       . budesonide-formoterol (SYMBICORT) 160-4.5 MCG/ACT inhaler Inhale 2 puffs into the lungs 2 (two) times daily.      Marland Kitchen diltiazem (CARDIZEM CD) 120 MG 24 hr capsule Take 120 mg by mouth 2 (two) times daily.      Marland Kitchen dronedarone (MULTAQ) 400 MG tablet Take 1 tablet (400 mg total) by mouth 2 (two) times daily with a meal.  60 tablet  5  . fluticasone (FLOVENT HFA) 44 MCG/ACT inhaler Inhale 2 puffs into the lungs 2 (two) times daily.      . Homeopathic Products (ARNICARE ARNICA) CREA Apply topically daily.      Marland Kitchen HYDROcodone-acetaminophen (NORCO) 7.5-325 MG per tablet Take 1 tablet by mouth every 6 (six) hours as needed.       . levalbuterol (XOPENEX) 0.63 MG/3ML nebulizer solution Take 1 ampule by nebulization every 8 (eight) hours as needed for wheezing.      Marland Kitchen LORazepam (ATIVAN) 1 MG tablet Take 1 mg by mouth every 4 (four) hours as needed for anxiety.       . meclizine (ANTIVERT) 25 MG tablet Take 25 mg by mouth every 6 (six) hours as needed.        . Multiple Minerals (MINERAL COMPLEX PO) Take 1 tablet by mouth daily.       . NON FORMULARY Place 3 L into the nose at bedtime. 3 liter as needed during day depending on extertion       . Nystatin ([place]) 100000 UNIT/GM POWD Apply topically.      Marland Kitchen OLIVE LEAF EXTRACT PO Take 450 mg by mouth daily.      . Turmeric 500 MG CAPS Take 500 mg by mouth daily as needed.      . Warfarin Sodium (JANTOVEN PO) Take 2.5 mg by mouth daily.       No current facility-administered medications on file prior to visit.

## 2014-01-29 NOTE — Telephone Encounter (Signed)
I spoke with patient about results and she verbalized understanding and had no questions 

## 2014-01-29 NOTE — Telephone Encounter (Signed)
CT scan was clear, with no sign of blood clot. Her overnight oximetry wearing CPAP and O2 3l showed some drop in oxygen still. Recommend increase her O2 to 4 L during sleep.

## 2014-01-29 NOTE — Telephone Encounter (Signed)
Pt is requesting results from CT, Blood work & sleep study.  Pt can be reached at [phone].  Jacqueline Nguyen

## 2014-02-02 ENCOUNTER — Ambulatory Visit (INDEPENDENT_AMBULATORY_CARE_PROVIDER_SITE_OTHER): Payer: Medicare Other | Admitting: Internal Medicine

## 2014-02-02 ENCOUNTER — Encounter: Payer: Self-pay | Admitting: Internal Medicine

## 2014-02-02 VITALS — BP 126/62 | HR 68 | Ht 66.0 in | Wt 290.0 lb

## 2014-02-02 DIAGNOSIS — J9611 Chronic respiratory failure with hypoxia: Secondary | ICD-10-CM

## 2014-02-02 DIAGNOSIS — G4733 Obstructive sleep apnea (adult) (pediatric): Secondary | ICD-10-CM

## 2014-02-02 NOTE — Progress Notes (Signed)
11/26/13- 80 yo F former smoker seen on kind referral from Dr Delray Alt beating fast and check up on CPAP. Former patient as well. Caregiver [name] is here Medical problems include obesity, hx PE/ coumadin, PHTN, PAF, HTN Originally dx'd OSA several years ago by Dr Jaynie Collins. Has been using CPAP 9/ and continuous O2 2L/ [facility]. Diagnostic NPSG being sought.  Bedtime is irregular, short sleep latency, waking only once for meds before up around 9AM. Sleeps very soundly after hydrocodone and lorazepam at bedtime.  Wakes with malaise and aware of heart racing often as she wakes, but not aware of this during the night. She is concerned, because she wakes like this, that there is something wrong with her CPAP or that it might fail. She insists she breathes and sleeps better with CPAP, used all night, every night. Caregiver says she does not snore while wearing it. No ENT surgery. Smoked 1ppd, quitting 1983. Unclear if dx'd lung disease. She is very limited by multiple medical problems and obesity, with little activity using a walker and portable O2. Hard cough/ scant white sputum w never blood or purulent. No wheeze. Treated at [facility] this summer for possible viral pneumonia with interstitial infiltrates.Taking Flovent HFA, Symbicort, ventolin HFA, Xopenex neb, but can't tell they do much.  Office Spirometry 11/26/13- severe restriction w FVC 1.43/ 49%, FEV1 0.95/ 45%, FEV1/FVC 0.66, FEF25-75% 0.52/33%, but poor technique with delayed exhalation so this may not be best capability.  01/13/14- 80 yo F former smoker followed for OSA, Chronic resp failure, complicated by PAF, PHTN/ coumadin, morbid obesity   Caregiver [name] is here FOLLOWS FOR: wears CPAP 9/ [facility] machine; DME is [facility]. Pt continues to wear her O2 2L with activity and at night. Download confirms good CPAP compliance and control. Feels bad as she first wakes up with rapid heart rate "80-90" sleeps in a recliner, tilted back. CXR  11/26/13 IMPRESSION:  Underlying chronic interstitial coarsening with mild vascular  congestion.  Electronically Signed  By: Misty Stanley M.D.  On: 11/26/2013 13:35  02/02/14- 80 yo F former smoker followed for OSA, Chronic resp failure, complicated by PAF, PHTN/ coumadin, morbid obesity   Caregiver [name] is here wearing CPAP 9/ [facility] every night with 3 lpm O2; has not yet increased to 4lpm.  Doesn't feel rested when waking up -- has palpitations, shakiness, and doesn't feel good in the mornings Still notes malaise as she sits up edge of bed in AM- orthostatic, not respiratory. Not known diabetic.  Declines flu vax.  CTa chest 01/14/14 IMPRESSION: No evidence of pulmonary embolus. Electronically Signed  By: Rolm Baptise M.D.  On: 01/14/2014 15:02  ROS-see HPI Constitutional:   No-   weight loss, night sweats, fevers, chills, fatigue, lassitude. HEENT:   No-  headaches, difficulty swallowing, tooth/dental problems, sore throat,       No-  sneezing, itching, ear ache, nasal congestion, post nasal drip,  CV:  No-   chest pain, orthopnea, PND, swelling in lower extremities, anasarca,                                                      dizziness, +palpitations Resp: +shortness of breath with exertion or at rest.              No-   productive cough,  No non-productive  cough,  No- coughing up of blood.              No-   change in color of mucus.  No- wheezing.   Skin: No-   rash or lesions. GI:  No-   heartburn, indigestion, abdominal pain, nausea, vomiting,  GU:  MS:  No-   joint pain or swelling.  . Neuro-     nothing unusual Psych:  No- change in mood or affect. No depression or anxiety.  No memory loss.  OBJ- Physical Exam   Talkative, + morbidly obese General- Alert, Oriented, Affect-appropriate, Distress- none acute,  walker, O2 2L/96% Skin- rash-none, lesions- none, excoriation- none Lymphadenopathy- none Head- atraumatic            Eyes- Gross vision intact, PERRLA,  conjunctivae and secretions clear            Ears- Hearing, canals-normal            Nose- Clear, no-Septal dev, mucus, polyps, erosion, perforation             Throat- Mallampati II , mucosa clear , drainage- none, tonsils- atrophic Neck- flexible , trachea midline, no stridor , thyroid nl, carotid no bruit Chest - symmetrical excursion , unlabored           Heart/CV- RRR , no murmur , no gallop  , no rub, nl s1 s2                           - JVD- none , edema- none, stasis changes- none, varices- none           Lung- clear to P&A, wheeze- none, cough- none , dullness-none, rub- none           Chest wall-  Abd-  Br/ Gen/ Rectal- Not done, not indicated Extrem- cyanosis- none, clubbing, none, atrophy- none, strength- nl, elastic hose, walker Neuro- grossly intact to observation

## 2014-02-02 NOTE — Assessment & Plan Note (Signed)
CPAP 9 effective and well tolerated with O2 3L. To address her c/p of morning discomfort we are going to let her increase sleep O2 to 4L for trial. CO2 retention discussed.  Plan increase sleep O2 to 4L, then get ONOX on CPAP w O2 4L.

## 2014-02-02 NOTE — Assessment & Plan Note (Signed)
No apparent effort now to lose weight. Realistically, at her age, activity level and current obesity make voluntary weight loss very difficult.

## 2014-02-02 NOTE — Patient Instructions (Addendum)
Order- DME [facility] increase sleep O2 to 4L through CPAP 9              ONOX on CPAP plus 4L O2   Try getting up once for bathroom once during the night. Always sit on the edge of your bed until your blood pressure and heart beat level off. See if getting up in the middle of the night causes the same shakiness, and whether doing it at night changes the way you feel in the mornig.

## 2014-02-19 ENCOUNTER — Telehealth: Payer: Self-pay | Admitting: Internal Medicine

## 2014-02-19 ENCOUNTER — Telehealth: Payer: Self-pay | Admitting: Cardiovascular Disease

## 2014-02-19 NOTE — Telephone Encounter (Signed)
Please have Jacqueline Nguyen to call her. Pt heart rate seems to be up,she can hear it beating in her ear.Her blood pressure have also been up a little.

## 2014-02-19 NOTE — Telephone Encounter (Signed)
Called and spoke with pt and she is aware of CY recs to call her PCP to check on this issue and her blood sugar checks.  Pt will call and set that up.

## 2014-02-19 NOTE — Telephone Encounter (Signed)
This is an issue for her PCP or cardiologist

## 2014-02-19 NOTE — Telephone Encounter (Signed)
I spoke with the pt and she complains of palpitations and her BP going up and down today. The pt said she has just had a "bad day". The pt's BP earlier today was 144/72, pulse 56 and at 3:45 111/58, 64.  The pt follows her weight on a daily basis 276 Tuesday, 275 Wednesday and 269 today.  The pt has noticed a slight increase in lower extremity swelling. INR on Monday was 2.1. Her SOB remains the same.  At this time the pt will continue to monitor her BP, pulse and weight.  She is due to take her evening dose of Atenolol at 6:20 PM.  I advised the pt to call the office if she is not feeling better tomorrow and we will arrange follow-up. Pt agreed with plan. I will forward this message to Dr C to review and make further recommendations.

## 2014-02-19 NOTE — Telephone Encounter (Signed)
Pt c/o pounding sensation in ears-- sounds like heart rate, dizziness, head pressure and patient states that she does not feel right. Pt reports that BP has been a little elevated 140's -- normal BP ranges per patient-120's HR normal - 80  Pt states that this is high compared to her normal HR range. Pt denies skin flushing.   Please advise Dr Annamaria Boots. Thanks.  Allergies  Allergen Reactions  . Ciprofloxacin   . Bactrim Rash  . Codeine Nausea Only   Current Outpatient Prescriptions on File Prior to Visit  Medication Sig Dispense Refill  . albuterol (PROVENTIL HFA;VENTOLIN HFA) 108 (90 BASE) MCG/ACT inhaler Inhale 2 puffs into the lungs every 6 (six) hours as needed for wheezing.    . Ascorbic Acid (VITAMIN C PO) Take 600 mg by mouth daily.    Marland Kitchen atenolol (TENORMIN) 25 MG tablet Take 25 mg by mouth 2 (two) times daily.     . brimonidine-timolol (COMBIGAN) 0.2-0.5 % ophthalmic solution Place 1 drop into both eyes every 12 (twelve) hours.     . brinzolamide (AZOPT) 1 % ophthalmic suspension Place 1 drop into both eyes 2 (two) times daily.     . budesonide-formoterol (SYMBICORT) 160-4.5 MCG/ACT inhaler Inhale 2 puffs into the lungs at bedtime.     Marland Kitchen diltiazem (CARDIZEM CD) 120 MG 24 hr capsule Take 120 mg by mouth 2 (two) times daily.    . fluticasone (FLOVENT HFA) 44 MCG/ACT inhaler Inhale 2 puffs into the lungs 2 (two) times daily.    . Homeopathic Products (ARNICARE ARNICA) CREA Apply topically daily.    Marland Kitchen HYDROcodone-acetaminophen (NORCO) 7.5-325 MG per tablet Take 1 tablet by mouth every 6 (six) hours as needed.     . levalbuterol (XOPENEX) 0.63 MG/3ML nebulizer solution Take 1 ampule by nebulization every 8 (eight) hours as needed for wheezing.    Marland Kitchen LORazepam (ATIVAN) 1 MG tablet Take 1 mg by mouth every 4 (four) hours as needed for anxiety.     . meclizine (ANTIVERT) 25 MG tablet Take 25 mg by mouth every 6 (six) hours as needed.      . Multiple Minerals (MINERAL COMPLEX PO) Take 1 tablet  by mouth daily.     . NON FORMULARY Place 3 L into the nose at bedtime. 3 liter as needed during day depending on extertion    . Nystatin ([place]) 100000 UNIT/GM POWD Apply topically.    Marland Kitchen OLIVE LEAF EXTRACT PO Take 450 mg by mouth daily.    . Turmeric 500 MG CAPS Take 500 mg by mouth daily as needed.    . valACYclovir (VALTREX) 1000 MG tablet Take 0.5 tablets by mouth 4 (four) times daily.    . Warfarin Sodium (JANTOVEN PO) Take 2.5 mg by mouth daily.     No current facility-administered medications on file prior to visit.

## 2014-02-23 ENCOUNTER — Telehealth: Payer: Self-pay | Admitting: Cardiovascular Disease

## 2014-02-23 NOTE — Telephone Encounter (Signed)
Please call asap.she have gained 10lbs overnight.

## 2014-02-23 NOTE — Telephone Encounter (Signed)
Returned call to patient. She reports gaining about 10lbs overnight. She feels "breathless" stating it is worse with exertion & she wears O2. She reports bilateral ankle & leg edema for about 1 week. She is concerned her dermatitis will come back r/t swelling. She states she has $200 compression stockings made for hear to help with swelling. She reports she has some aches in her chest but may be r/t an injury. She reports she eats good - does not eat junk food, she eats veggies and eats organic food. She also reports that she wakes up with urgency to go to the bathroom and has some dribbling. She will sometimes wake up with this urgency but has to lie still for a while b/c she has dizziness. She reports home systolic BP 078-675.   Spoke with Dr. Ellyn Hack and he felt this issue would best be addressed at an office visit. Will contact patient. Will call [facility] to add on to "Flex" schedule tomorrow.

## 2014-02-24 ENCOUNTER — Telehealth: Payer: Self-pay | Admitting: Physician Assistant

## 2014-02-24 ENCOUNTER — Encounter: Payer: Self-pay | Admitting: Physician Assistant

## 2014-02-24 ENCOUNTER — Telehealth: Payer: Self-pay | Admitting: *Deleted

## 2014-02-24 ENCOUNTER — Ambulatory Visit (INDEPENDENT_AMBULATORY_CARE_PROVIDER_SITE_OTHER): Payer: Medicare Other | Admitting: Physician Assistant

## 2014-02-24 VITALS — BP 130/70 | HR 61 | Ht 66.0 in | Wt 290.0 lb

## 2014-02-24 DIAGNOSIS — I480 Paroxysmal atrial fibrillation: Secondary | ICD-10-CM

## 2014-02-24 DIAGNOSIS — Z7901 Long term (current) use of anticoagulants: Secondary | ICD-10-CM

## 2014-02-24 DIAGNOSIS — Z86711 Personal history of pulmonary embolism: Secondary | ICD-10-CM

## 2014-02-24 DIAGNOSIS — Z5181 Encounter for therapeutic drug level monitoring: Secondary | ICD-10-CM

## 2014-02-24 DIAGNOSIS — G4733 Obstructive sleep apnea (adult) (pediatric): Secondary | ICD-10-CM

## 2014-02-24 DIAGNOSIS — I10 Essential (primary) hypertension: Secondary | ICD-10-CM

## 2014-02-24 DIAGNOSIS — R0602 Shortness of breath: Secondary | ICD-10-CM

## 2014-02-24 LAB — BASIC METABOLIC PANEL
BUN: 12 mg/dL (ref 6–23)
CALCIUM: 8.9 mg/dL (ref 8.4–10.5)
CO2: 31 meq/L (ref 19–32)
Chloride: 102 mEq/L (ref 96–112)
Creatinine, Ser: 0.7 mg/dL (ref 0.4–1.2)
GFR: 84.12 mL/min (ref >60.00)
Glucose, Bld: 96 mg/dL (ref 70–99)
Potassium: 4.1 mEq/L (ref 3.5–5.1)
SODIUM: 141 meq/L (ref 135–145)

## 2014-02-24 LAB — BRAIN NATRIURETIC PEPTIDE: Pro B Natriuretic peptide (BNP): 118 pg/mL — ABNORMAL HIGH (ref 0.0–100.0)

## 2014-02-24 MED ORDER — FUROSEMIDE 20 MG PO TABS: ORAL_TABLET | ORAL | Status: DC

## 2014-02-24 NOTE — Patient Instructions (Signed)
LAB WORK TODAY; BMET, BNP  REPEAT BMET 03/06/14 WHEN YOU HAVE YOUR APPT WITH DR. Sallyanne Kuster  LASIX 20 MG 1 TABLET DAILY X 2 DAYS THEN ONLY TAKE AS NEEDED FOR WEIGHT GAIN OF 3 LB'S OR MORE X 1 DAY OR 5 LB'S X 1 WEEK

## 2014-02-24 NOTE — Telephone Encounter (Signed)
New Msg   Patient would like to know if prescription for Lasix can be sent to [facility] in [place], [place]. Patient states she's confused as to whether or not she was supposed to get a new prescription. Patient has been informed that there are no samples for that medication as she was requesting this as well.

## 2014-02-24 NOTE — Progress Notes (Signed)
Cardiology Office Note   Date:  02/24/2014   ID:  Jacqueline Nguyen, DOB April 08, 1931, MRN [id number]  PCP:  Sheral Apley  Cardiologist:  Dr. Liam Rogers >> Dr. Dani Gobble Croitoru     History of Present Illness: Jacqueline Nguyen is a 80 y.o. female with a hx of massive bilateral pulmonary emboli, HTN, PAF, OSA on CPAP, chronic respiratory failure (followed by Dr. Annamaria Boots) on chronic O2, osteoporosis, DJD, palpitations.  Patient has significant palpitations and had an event monitor in 11/2013 that did not demonstrate AFib.  She did have some PACs and some PAT.  Rx with beta blockers and CCB has been limited due to soft BP.  Last seen by Dr. Dani Gobble Croitoru last month and AAD therapy was discussed.  She was not interested in Sotalol or Dofetilide. Amiodarone was not felt to be a good choice given her underlying lung disease.  She had recently had overnight monitoring that demonstrated nocturnal desaturation and her O2 was increased to 3 LPM.  When seen by Dr. Sallyanne Kuster, she was placed on Multaq for rhythm control.  However, the patient decided against taking this.  She called in recently with a 10 lb weight gain and was added on to my schedule for evaluation.  She is here with her aid.  She notes chronic DOE. She has chronic chest heaviness with exertion.  She wears chronic O2.  She notes continued rapid palpitations.  These are improved with increased levels of O2 at night. She sleeps in a recliner chronically.  She sleeps with CPAP.  She does not occasional symptoms of PND.  She notes increasing LE edema.  She shows me a list of her weights over the past week.  Her weights have been quite labile (266 >> 276 >> 275 >> 269 >> 279 >> 271).  She has a chronic cough with yellow sputum.  No hemoptysis.  No fever.  No melena or hematochezia.     Studies:  - Echo (8/15):  Mild LVH, EF 65%, no RWMA, Ao sclerosis, no AS, mod MAC, mild LAE, normal RVSF, PASP 40 mmHg  - Carotid US (5/14):  No sig ICA stenosis  - Event  Monitor (8/15):  Frequent PACs, brief bursts of nonsustained ATach; no AFib   Recent Labs: 08/20/2013: ALT 12; Hemoglobin 13.4; TSH 0.95 01/13/2014: BUN 10; Creatinine 0.7; Potassium 4.1; Sodium 138    Recent Radiology: Ct Angio Chest Pe W/cm &/or Wo Cm  01/14/2014  IMPRESSION: No evidence of pulmonary embolus.   Electronically Signed   By: Rolm Baptise M.D.   On: 01/14/2014 15:02     Wt Readings from Last 3 Encounters:  02/24/14 290 lb (131.543 kg)  02/02/14 290 lb (131.543 kg)  01/13/14 285 lb 9.6 oz (129.547 kg)     Past Medical History  Diagnosis Date  . Obesities, morbid   . Skin cancer   . Cataract   . OSA (obstructive sleep apnea)   . Osteoporosis   . Arthritis   . Vertigo   . Hx pulmonary embolism     on chronic coumadin  . Endometriosis   . PAF (paroxysmal atrial fibrillation)     Event Monitor (8/15):  Frequent PACs, brief bursts of nonsustained ATach; no AFib  . Anticoagulated on Coumadin   . Psoriasis   . Hx of echocardiogram     Echo (8/15):  Mild LVH, EF 65%, no RWMA, Ao sclerosis, no AS, mod MAC, mild LAE, normal RVSF, PASP 40 mmHg  . HTN (hypertension)   .  Chronic respiratory failure     Current Outpatient Prescriptions  Medication Sig Dispense Refill  . albuterol (PROVENTIL HFA;VENTOLIN HFA) 108 (90 BASE) MCG/ACT inhaler Inhale 2 puffs into the lungs every 6 (six) hours as needed for wheezing.    . Ascorbic Acid (VITAMIN C PO) Take 600 mg by mouth daily.    Marland Kitchen atenolol (TENORMIN) 25 MG tablet Take 25 mg by mouth 2 (two) times daily.     . brimonidine-timolol (COMBIGAN) 0.2-0.5 % ophthalmic solution Place 1 drop into both eyes every 12 (twelve) hours.     . brinzolamide (AZOPT) 1 % ophthalmic suspension Place 1 drop into both eyes 2 (two) times daily.     . budesonide-formoterol (SYMBICORT) 160-4.5 MCG/ACT inhaler Inhale 2 puffs into the lungs at bedtime.     Marland Kitchen diltiazem (CARDIZEM CD) 120 MG 24 hr capsule Take 120 mg by mouth 2 (two) times daily.    .  fluticasone (FLOVENT HFA) 44 MCG/ACT inhaler Inhale 2 puffs into the lungs 2 (two) times daily.    . Homeopathic Products (ARNICARE ARNICA) CREA Apply 1 application topically daily.     Marland Kitchen HYDROcodone-acetaminophen (NORCO) 7.5-325 MG per tablet Take 1 tablet by mouth every 6 (six) hours as needed.     . levalbuterol (XOPENEX) 0.63 MG/3ML nebulizer solution Take 1 ampule by nebulization every 8 (eight) hours as needed for wheezing.    Marland Kitchen LORazepam (ATIVAN) 1 MG tablet Take 1 mg by mouth every 4 (four) hours as needed for anxiety.     . meclizine (ANTIVERT) 25 MG tablet Take 25 mg by mouth every 6 (six) hours as needed.      . Multiple Minerals (MINERAL COMPLEX PO) Take 1 tablet by mouth daily.     . NON FORMULARY Place 4 L into the nose at bedtime. 3 liter as needed during day depending on extertion    . Nystatin ([place]) 100000 UNIT/GM POWD Apply topically.    Marland Kitchen OLIVE LEAF EXTRACT PO Take 450 mg by mouth daily.    . Turmeric 500 MG CAPS Take 500 mg by mouth daily as needed.    . Warfarin Sodium (JANTOVEN PO) Take 2.5 mg by mouth daily.     No current facility-administered medications for this visit.     Allergies:   Ciprofloxacin; Bactrim; and Codeine   Social History:  The patient  reports that she quit smoking about 32 years ago. Her smoking use included Cigarettes. She has a 2 pack-year smoking history. She has never used smokeless tobacco. She reports that she does not drink alcohol or use illicit drugs.   Family History:  The patient's family history includes Heart attack in her brother and father.   ROS:  Please see the history of present illness.      All other systems reviewed and negative.    PHYSICAL EXAM: VS:  BP 130/70 mmHg  Pulse 61  Ht _0  (1.676 m)  Wt 290 lb (131.543 kg)  BMI 46.83 kg/m2 Well nourished, well developed, in no acute distress HEENT: normal Neck: I cannot appreciate JVD Cardiac:  normal S1, S2;  RRR; no murmur   Lungs:  Decreased breath sounds  bilaterally, no wheezing, rhonchi or rales Abd: soft, nontender, no hepatomegaly Ext: trace bilateral ankle edema Skin: warm and dry Neuro:  CNs 2-12 intact, no focal abnormalities noted  EKG:  NSR HR 61, normal axis, low voltage, NSSTTW changes, no change from prior tracing.       ASSESSMENT AND PLAN:  1.  Shortness of breath:  She has chronic dyspnea.  This is multifactorial and mainly related to sleep apnea, prior pulmonary embolism, pulmonary HTN and probable OHS.  She may have an element of diastolic dysfunction.  She has had some weight fluctuations at home.  Exam is difficult to assess for volume excess.    -  BMET, BNP today.    -  Lasix 20 mg QD x 2 days, then take QD prn for edema or weight gain.    -  Increase dietary K+ on days she takes Lasix.     -  Repeat BMET at appointment next week. 2.  PAF (paroxysmal atrial fibrillation):  Maintaining NSR.  She remains on Warfarin.  She continues to have palpitations.  However, these are improved with increased oxygen at night. 3.  Essential hypertension:  Controlled.  4.  Obstructive sleep apnea:  FU with pulmonology. 5.  History of pulmonary embolism:  She remains on Warfarin.  This is followed by primary care.  Disposition:   FU with Dr. Dani Gobble Croitoru next week as planned.    Signed, Versie Starks, MHS 02/24/2014 2:53 PM    [facility] [address], [place], [place]  [address] Phone: [phone]; Fax: [phone]

## 2014-02-24 NOTE — Telephone Encounter (Signed)
pt notified about lab results with verbal understanding

## 2014-02-24 NOTE — Telephone Encounter (Signed)
Please let her know I agree with Trinidad Curet recommendations from yesterday and look forward to seeing her at her appointment next week.

## 2014-02-24 NOTE — Telephone Encounter (Signed)
Patient scheduled for 2pm with Richardson Dopp, PA - no DOD slots available and no open slots with MDs or PA that patient can make today.   Patient notified. Address & phone number of office location given to patient.

## 2014-02-24 NOTE — Telephone Encounter (Signed)
Notes needing to be added to triage message from 02/23/14  Weights 11/21 271.6 lbs 11/22 279 lbs 11/23 270.6 lbs   Reminded patient to bring list of BPs and weights with her to OV.

## 2014-02-24 NOTE — Telephone Encounter (Signed)
lmom rx for lasix already sent into [facility] during her appt today.

## 2014-02-25 NOTE — Telephone Encounter (Signed)
Called and let patient know Dr. Loletha Grayer agrees with Jacqueline Nguyen advise and to keep appt next week.  Discussed sodium intake and she states she only uses "sea salt"  Advised patient this IS salt and to be very careful.  Patient voiced understanding.

## 2014-03-06 ENCOUNTER — Encounter: Payer: Self-pay | Admitting: Cardiovascular Disease

## 2014-03-06 ENCOUNTER — Ambulatory Visit (INDEPENDENT_AMBULATORY_CARE_PROVIDER_SITE_OTHER): Payer: Medicare Other | Admitting: Cardiovascular Disease

## 2014-03-06 VITALS — BP 138/72 | HR 67 | Resp 16 | Ht 66.0 in | Wt 277.0 lb

## 2014-03-06 DIAGNOSIS — J9611 Chronic respiratory failure with hypoxia: Secondary | ICD-10-CM

## 2014-03-06 DIAGNOSIS — G4733 Obstructive sleep apnea (adult) (pediatric): Secondary | ICD-10-CM

## 2014-03-06 DIAGNOSIS — R0602 Shortness of breath: Secondary | ICD-10-CM

## 2014-03-06 DIAGNOSIS — I10 Essential (primary) hypertension: Secondary | ICD-10-CM

## 2014-03-06 DIAGNOSIS — I270 Primary pulmonary hypertension: Secondary | ICD-10-CM

## 2014-03-06 DIAGNOSIS — I272 Pulmonary hypertension, unspecified: Secondary | ICD-10-CM

## 2014-03-06 DIAGNOSIS — I480 Paroxysmal atrial fibrillation: Secondary | ICD-10-CM

## 2014-03-06 MED ORDER — FUROSEMIDE 20 MG PO TABS: ORAL_TABLET | ORAL | Status: DC

## 2014-03-06 NOTE — Patient Instructions (Signed)
Your physician has recommended making the following medication changes:  TAKE your Furosemide (fluid pill) 20 mg on days WHEN WEIGHT EXCEEDS 280 POUNDS.  Dr Sallyanne Kuster wants you to follow-up in 8 WEEKS. You will receive a reminder letter in the mail one months in advance. If you don't receive a letter, please call our office to schedule the follow-up appointment.

## 2014-03-08 ENCOUNTER — Encounter: Payer: Self-pay | Admitting: Cardiovascular Disease

## 2014-03-08 NOTE — Progress Notes (Signed)
Patient ID: Jacqueline Nguyen, female   DOB: June 11, 1931, 80 y.o.   MRN: [id number]      Reason for office visit Right heart failure, cor pulmonale, atrial tachycardia  She was having worsened sleeping problems and fatigue, more palpitations, but has improved after Dr. Annamaria Boots adjusted )2 and CPAP settings. Diuretics prescribed by Richardson Dopp also had a beneficial effect.  She brings a list of her daily weights today, but it is hard to believe they are accurate (weight swings up to 15 lb from day to day). Clinic weights have been more steady. Seems to do better at weights < 280 lb. Part of the problem may be that she weighs at different times of the day and has erratic sleep behavior. BP has been marginally elevated at times and she associates that with her episodes of feeling poorly.  In 2009 she had a "massive" bilateral pulmonary embolism and she has long-standing systemic hypertension, morbid obesity, reactive airway disease, obstructive sleep apnea (on CPAP) and osteoporosis and degenerative arthritis. She wears oxygen at night and with activity and often uses a scooter. In 2014 she had transient right eye blindness, not long after cataract surgery. Has glaucoma She attributed it to treatment with Mobic. About 6 months ago she had pneumonia from which she recovered very slowly. Echo in August 2015 showed normal left ventricular regional wall motion and EF 65%, no significant valvular abnormalities, estimated systolic PA pressure 40 mm Hg. Her event monitor showed frequent PACs and brief bursts of nonsustained atrial tachycardia, but there was no confirmed atrial fibrillation.  Allergies  Allergen Reactions  . Ciprofloxacin   . Bactrim Rash  . Codeine Nausea Only    Current Outpatient Prescriptions  Medication Sig Dispense Refill  . albuterol (PROVENTIL HFA;VENTOLIN HFA) 108 (90 BASE) MCG/ACT inhaler Inhale 2 puffs into the lungs every 6 (six) hours as needed for wheezing.    . Ascorbic Acid  (VITAMIN C PO) Take 600 mg by mouth daily.    Marland Kitchen atenolol (TENORMIN) 25 MG tablet Take 25 mg by mouth 2 (two) times daily.     . brimonidine-timolol (COMBIGAN) 0.2-0.5 % ophthalmic solution Place 1 drop into both eyes every 12 (twelve) hours.     . brinzolamide (AZOPT) 1 % ophthalmic suspension Place 1 drop into both eyes 2 (two) times daily.     . budesonide-formoterol (SYMBICORT) 160-4.5 MCG/ACT inhaler Inhale 2 puffs into the lungs at bedtime.     Marland Kitchen diltiazem (CARDIZEM CD) 120 MG 24 hr capsule Take 120 mg by mouth 2 (two) times daily.    . fluticasone (FLOVENT HFA) 44 MCG/ACT inhaler Inhale 2 puffs into the lungs 2 (two) times daily.    . furosemide (LASIX) 20 MG tablet Take 20 mg daily for 2 days then take only as needed for weight gain 3 lb's x 1 day or 5 lb's x 1 week 30 tablet 2  . Homeopathic Products (ARNICARE [place]) CREA Apply 1 application topically daily.     Marland Kitchen HYDROcodone-acetaminophen (NORCO) 7.5-325 MG per tablet Take 1 tablet by mouth every 6 (six) hours as needed.     . levalbuterol (XOPENEX) 0.63 MG/3ML nebulizer solution Take 1 ampule by nebulization every 8 (eight) hours as needed for wheezing.    Marland Kitchen LORazepam (ATIVAN) 1 MG tablet Take 1 mg by mouth every 4 (four) hours as needed for anxiety.     . meclizine (ANTIVERT) 25 MG tablet Take 25 mg by mouth every 6 (six) hours as needed.      Marland Kitchen  Multiple Minerals (MINERAL COMPLEX PO) Take 1 tablet by mouth daily.     . NON FORMULARY Place 4 L into the nose at bedtime. 3 liter as needed during day depending on extertion    . Nystatin ([place]) 100000 UNIT/GM POWD Apply topically.    Marland Kitchen OLIVE LEAF EXTRACT PO Take 450 mg by mouth daily.    . Turmeric 500 MG CAPS Take 500 mg by mouth daily as needed.    . warfarin (COUMADIN) 2.5 MG tablet Take 2.5 mg by mouth as directed.      No current facility-administered medications for this visit.    Past Medical History  Diagnosis Date  . Obesities, morbid   . Skin cancer   . Cataract   . OSA  (obstructive sleep apnea)   . Osteoporosis   . Arthritis   . Vertigo   . Hx pulmonary embolism     on chronic coumadin  . Endometriosis   . PAF (paroxysmal atrial fibrillation)     Event Monitor (8/15):  Frequent PACs, brief bursts of nonsustained ATach; no AFib  . Anticoagulated on Coumadin   . Psoriasis   . Hx of echocardiogram     Echo (8/15):  Mild LVH, EF 65%, no RWMA, Ao sclerosis, no AS, mod MAC, mild LAE, normal RVSF, PASP 40 mmHg  . HTN (hypertension)   . Chronic respiratory failure     Past Surgical History  Procedure Laterality Date  . Oophorectomy    . Nose surgery    . Skin surgery    . Gallbladder surgery    . Uvulopalatopharyngoplasty    . Cataract surgery      Family History  Problem Relation Age of Onset  . Heart attack Father   . Heart attack Brother     History   Social History  . Marital Status: Widowed    Spouse Name: N/A    Number of Children: N/A  . Years of Education: N/A   Occupational History  . Not on file.   Social History Main Topics  . Smoking status: Former Smoker -- 1.00 packs/day for 2 years    Types: Cigarettes    Quit date: 04/03/1981  . Smokeless tobacco: Never Used  . Alcohol Use: No  . Drug Use: No  . Sexual Activity: Not Currently   Other Topics Concern  . Not on file   Social History Narrative    Review of systems: She complains of palpitations, occasional dizziness, chronic shortness of breath, intermittent lower extremity edema. She does not have chest pain. She has occasional wheezing but currently does not have cough or hemoptysis. She has never experienced a full-blown syncope.  The patient also denies abdominal pain, nausea, vomiting, dysphagia, diarrhea, constipation, polyuria, polydipsia, dysuria, hematuria, frequency, urgency, abnormal bleeding or bruising, fever, chills, unexpected weight changes, mood swings, change in skin or hair texture, change in voice quality, auditory or visual problems, allergic  reactions or rashes, new musculoskeletal complaints other than usual "aches and pains".  PHYSICAL EXAM BP 138/72 mmHg  Pulse 67  Resp 16  Ht _0  (1.676 m)  Wt 277 lb (125.646 kg)  BMI 44.73 kg/m2 Morbid obesity limits the physical exam  General: Alert, oriented x3, no distress  Head: no evidence of trauma, PERRL, EOMI, no exophtalmos or lid lag, no myxedema, no xanthelasma; normal ears, nose and oropharynx  Neck: jugular venous pulsations are difficult to see; brisk carotid pulses without delay and no carotid bruits  Chest: clear to auscultation,  no signs of consolidation by percussion or palpation, normal fremitus, symmetrical and full respiratory excursions  Cardiovascular: Unable to locate the apical impulse, regular rhythm, normal first and loud second heart sounds, no murmurs, rubs or gallops  Abdomen: no tenderness or distention, no masses by palpation, no abnormal pulsatility or arterial bruits, normal bowel sounds, no hepatosplenomegaly  Extremities: no clubbing, cyanosis or edema; 2+ radial, ulnar and brachial pulses bilaterally; 2+ right femoral, posterior tibial and dorsalis pedis pulses; 2+ left femoral, posterior tibial and dorsalis pedis pulses; no subclavian or femoral bruits  Neurological: grossly nonfocal   EKG: Sinus rhythm, generalized low voltage related to obesity, slight delay in anterior R wave progression   Lipid Panel     Component Value Date/Time   CHOL 205* 08/30/2012 0550   TRIG 102 08/30/2012 0550   HDL 55 08/30/2012 0550   CHOLHDL 3.7 08/30/2012 0550   VLDL 20 08/30/2012 0550   LDLCALC 130* 08/30/2012 0550    BMET    Component Value Date/Time   NA 141 02/24/2014 1543   K 4.1 02/24/2014 1543   CL 102 02/24/2014 1543   CO2 31 02/24/2014 1543   GLUCOSE 96 02/24/2014 1543   BUN 12 02/24/2014 1543   CREATININE 0.7 02/24/2014 1543   CALCIUM 8.9 02/24/2014 1543   GFRNONAA 81* 08/29/2012 0910   GFRAA >90 08/29/2012 0910     ASSESSMENT  AND PLAN  Mrs. Osias has chronic multifactorial respiratory failure with hypoxia (obesity, previous pulmonary embolism, COPD, OSA) and secondary right heart failure and arrhythmia. Her arrhythmia subsides when she has better respiratory status, typical for ectopic atrial tachycardia. She responds well to treatment of hypervolemia also.  Very difficult to establish her "dry weight" due to erratic weight recordings. Have recommended more consistent times for her weights. Take diuretic for weight over 280 lb and reassess.  Patient Instructions  Your physician has recommended making the following medication changes:  TAKE your Furosemide (fluid pill) 20 mg on days WHEN WEIGHT EXCEEDS 280 POUNDS.  Dr Sallyanne Kuster wants you to follow-up in 8 WEEKS. You will receive a reminder letter in the mail one months in advance. If you don't receive a letter, please call our office to schedule the follow-up appointment.     Orders Placed This Encounter  Procedures  . EKG 12-Lead   Meds ordered this encounter  Medications  . warfarin (COUMADIN) 2.5 MG tablet    Sig: Take 2.5 mg by mouth as directed.   . furosemide (LASIX) 20 MG tablet    Sig: Take 20 mg daily for 2 days then take only as needed for weight gain 3 lb's x 1 day or 5 lb's x 1 week    Dispense:  30 tablet    Refill:  Holt Cleburn Maiolo, MD, [place] [phone] office [phone] pager

## 2014-03-17 ENCOUNTER — Telehealth: Payer: Self-pay | Admitting: Cardiovascular Disease

## 2014-03-17 MED ORDER — AMLODIPINE BESYLATE 2.5 MG PO TABS: 2.5000 mg | ORAL_TABLET | Freq: Every day | ORAL | Status: DC

## 2014-03-17 NOTE — Telephone Encounter (Signed)
Returned call to patient Dr.Croitoru advised to add amlodipine 2.5 mg daily.Advised to take other medications as prescribed.Advised to call back if B/P don't improve.

## 2014-03-17 NOTE — Telephone Encounter (Signed)
Returned call to patient she stated her B/P has been elevated ranging 151/80 to 140/76 pulse 76 to 80 bpm.Stated she weighed 265 lbs last Fri and yesterday weight 283 lbs.Stated she took a lasix 20 mg yesterday this morning her weight 275 lbs.Stated she does have swelling in both lower legs.Sob no worse than normally.She also stated she increased atenolol to 25 mg three times a day 3 days ago but has not seen much improvement.Message sent to Dr.Croitoru for advice.

## 2014-03-17 NOTE — Telephone Encounter (Signed)
Please add amlodipine 2.5 mg daily

## 2014-03-17 NOTE — Telephone Encounter (Signed)
Pt called in concerned about her BP. She says that when she wakes up in the morning, her BP is high and she feels like that its keeps increasing. She states that she is now taking her Atenolol tid. Please call  Thanks

## 2014-03-20 ENCOUNTER — Telehealth: Payer: Self-pay | Admitting: Cardiovascular Disease

## 2014-03-20 NOTE — Telephone Encounter (Signed)
Pt called in stating that her BP is still high in the mornings. She is concerned about the dosage amount she is consuming.  She says that when she takes the medication in the morning , her BP is back up at night. Please call and advise  Thanks

## 2014-03-20 NOTE — Telephone Encounter (Signed)
Spoke to patient She seemed anxious about blood pressure still ranging in the 150's and 140's.s she worried she will have a stroke with these reading. Wanted to know when to take her medication or if she need some more medications. RN reassured patient. RN informed patient to decide on what time to take amlodipine and take it at the same time. She verbalized understanding.She states she will take it at evening dose medications. RN informed patient to keep a log of blood pressure readings and call back after the first of the year.She verbalized understanding.

## 2014-04-06 ENCOUNTER — Telehealth: Payer: Self-pay | Admitting: Cardiovascular Disease

## 2014-04-06 NOTE — Telephone Encounter (Signed)
Pt is callin in stating that she doesn't feel like the Amlodipine is working as it should. She feels that her BP increases the most when she is sleeping while on CPAP machine. She also states that she feels tired ,SOB, having a dry cough, and HR is in the high 70's and low 80's . Please call to advise  Thanks

## 2014-04-06 NOTE — Telephone Encounter (Signed)
I think her blood pressure is in optimal range and no adjustments are necessary to her medication. I'm very happy with her blood pressure readings

## 2014-04-06 NOTE — Telephone Encounter (Signed)
Pt called back to update on her BPs. She had been started on Amlodipine 2.14m Daily 12/15 after telephone call. Then she'd reported syst. BPs in 140s-150s in AM.  Pt states today BPs 130s to 140s /70s. She is concerned that these numbers are high, "out of control", and that she is going to be at "stroke level". Also reported her HR which is 70-80, consistent with her last self-report.   Of note: Pt states she is taking atenolol 230mTID instead of BID as written.   I reassured pt that based on info given, her BP numbers look fine. Told her I would bring to Dr. CrVictorino Decemberttention and that if he felt her atenolol or amlodipine need to be adjusted further, I would communicate this with her.

## 2014-04-06 NOTE — Telephone Encounter (Signed)
Communicated Dr. Victorino December advice to patient, she voiced understanding.

## 2014-04-14 ENCOUNTER — Ambulatory Visit: Payer: Medicare Other | Admitting: Internal Medicine

## 2014-04-28 ENCOUNTER — Telehealth: Payer: Self-pay | Admitting: Cardiovascular Disease

## 2014-04-29 NOTE — Telephone Encounter (Signed)
Close encounter

## 2014-05-01 ENCOUNTER — Encounter: Payer: Self-pay | Admitting: Cardiovascular Disease

## 2014-05-01 ENCOUNTER — Ambulatory Visit (INDEPENDENT_AMBULATORY_CARE_PROVIDER_SITE_OTHER): Payer: Medicare Other | Admitting: Cardiovascular Disease

## 2014-05-01 VITALS — BP 155/74 | HR 78 | Ht 66.0 in | Wt 292.0 lb

## 2014-05-01 DIAGNOSIS — I480 Paroxysmal atrial fibrillation: Secondary | ICD-10-CM

## 2014-05-01 DIAGNOSIS — J9611 Chronic respiratory failure with hypoxia: Secondary | ICD-10-CM

## 2014-05-01 DIAGNOSIS — Z5181 Encounter for therapeutic drug level monitoring: Secondary | ICD-10-CM

## 2014-05-01 DIAGNOSIS — Z7901 Long term (current) use of anticoagulants: Secondary | ICD-10-CM

## 2014-05-01 DIAGNOSIS — I272 Pulmonary hypertension, unspecified: Secondary | ICD-10-CM

## 2014-05-01 DIAGNOSIS — G4733 Obstructive sleep apnea (adult) (pediatric): Secondary | ICD-10-CM

## 2014-05-01 DIAGNOSIS — R42 Dizziness and giddiness: Secondary | ICD-10-CM

## 2014-05-01 DIAGNOSIS — I10 Essential (primary) hypertension: Secondary | ICD-10-CM

## 2014-05-01 DIAGNOSIS — I270 Primary pulmonary hypertension: Secondary | ICD-10-CM

## 2014-05-01 DIAGNOSIS — Z86711 Personal history of pulmonary embolism: Secondary | ICD-10-CM

## 2014-05-01 NOTE — Progress Notes (Signed)
Patient ID: Jacqueline Nguyen, female   DOB: [birth year], 81 y.o.   MRN: [id number]      Reason for office visit Hypertension, dizziness, right heart failure  Jacqueline Nguyen has several somatic complaints today. She complains of dizziness described as a spinning sensation that is brought on by simply turning her head. She also had some tinnitus just before the severe dizziness began. It is not associated nausea.  As always she complains of chronic shortness of breath, probably not any worse than usual. She has noticed worsening lower extremity edema. She brought a log of her weight at home that is pretty consistently between 261 and 265 pounds. On the other hand in our office we wait a couple of times and found her weight to be around 290 pounds when she stopped supporting herself on her walker. I think her home weight estimations are erroneous since she is holding on for support. Jacqueline Nguyen has been taking furosemide 20 mg on a daily basis.  She reports good compliance with CPAP.  Jacqueline Nguyen reports a lump in her left flank that is tender to palpation. On exam this corresponds to the tip of her 11th rib.  In 2009 she had a "massive" bilateral pulmonary embolism and she has long-standing systemic hypertension, morbid obesity, reactive airway disease, obstructive sleep apnea (on CPAP) and osteoporosis and degenerative arthritis. She wears oxygen at night and with activity and often uses a scooter. In 2014 she had transient right eye blindness, not long after cataract surgery. Has glaucoma She attributed it to treatment with Mobic. About 6 months ago she had pneumonia from which she recovered very slowly. Echo in August 2015 showed normal left ventricular regional wall motion and EF 65%, no significant valvular abnormalities, estimated systolic PA pressure 40 mm Hg. Her event monitor showed frequent PACs and brief bursts of nonsustained atrial tachycardia, but there was no confirmed atrial fibrillation.  Allergies  Allergen  Reactions  . Ciprofloxacin   . Bactrim Rash  . Codeine Nausea Only    Current Outpatient Prescriptions  Medication Sig Dispense Refill  . albuterol (PROVENTIL HFA;VENTOLIN HFA) 108 (90 BASE) MCG/ACT inhaler Inhale 2 puffs into the lungs every 6 (six) hours as needed for wheezing.    Marland Kitchen amLODipine (NORVASC) 2.5 MG tablet Take 1 tablet (2.5 mg total) by mouth daily. 30 tablet 6  . Ascorbic Acid (VITAMIN C PO) Take 600 mg by mouth daily.    Marland Kitchen atenolol (TENORMIN) 25 MG tablet Take 25 mg by mouth 2 (two) times daily.     . brimonidine-timolol (COMBIGAN) 0.2-0.5 % ophthalmic solution Place 1 drop into both eyes every 12 (twelve) hours.     . brinzolamide (AZOPT) 1 % ophthalmic suspension Place 1 drop into both eyes 2 (two) times daily.     . budesonide-formoterol (SYMBICORT) 160-4.5 MCG/ACT inhaler Inhale 2 puffs into the lungs at bedtime.     Marland Kitchen diltiazem (CARDIZEM CD) 120 MG 24 hr capsule Take 120 mg by mouth 2 (two) times daily.    . fluticasone (FLOVENT HFA) 44 MCG/ACT inhaler Inhale 2 puffs into the lungs 2 (two) times daily.    . furosemide (LASIX) 20 MG tablet Take 20 mg daily for 2 days then take only as needed for weight gain 3 lb's x 1 day or 5 lb's x 1 week (Patient taking differently: Take 20 mg by mouth daily. Take 46m daily) 30 tablet 2  . Homeopathic Products (ARNICARE ARNICA) CREA Apply 1 application topically daily.     .Marland Kitchen  HYDROcodone-acetaminophen (NORCO) 7.5-325 MG per tablet Take 1 tablet by mouth every 6 (six) hours as needed.     . levalbuterol (XOPENEX) 0.63 MG/3ML nebulizer solution Take 1 ampule by nebulization every 8 (eight) hours as needed for wheezing.    Marland Kitchen LORazepam (ATIVAN) 1 MG tablet Take 1 mg by mouth every 4 (four) hours as needed for anxiety.     . meclizine (ANTIVERT) 25 MG tablet Take 25 mg by mouth every 6 (six) hours as needed.      . Multiple Minerals (MINERAL COMPLEX PO) Take 1 tablet by mouth daily.     . NON FORMULARY Place 4 L into the nose at bedtime. 3  liter as needed during day depending on extertion    . Nystatin ([place]) 100000 UNIT/GM POWD Apply topically.    Marland Kitchen OLIVE LEAF EXTRACT PO Take 450 mg by mouth daily.    . Turmeric 500 MG CAPS Take 500 mg by mouth daily as needed.    . warfarin (COUMADIN) 2.5 MG tablet Take 2.5 mg by mouth as directed.      No current facility-administered medications for this visit.    Past Medical History  Diagnosis Date  . Obesities, morbid   . Skin cancer   . Cataract   . OSA (obstructive sleep apnea)   . Osteoporosis   . Arthritis   . Vertigo   . Hx pulmonary embolism     on chronic coumadin  . Endometriosis   . PAF (paroxysmal atrial fibrillation)     Event Monitor (8/15):  Frequent PACs, brief bursts of nonsustained ATach; no AFib  . Anticoagulated on Coumadin   . Psoriasis   . Hx of echocardiogram     Echo (8/15):  Mild LVH, EF 65%, no RWMA, Ao sclerosis, no AS, mod MAC, mild LAE, normal RVSF, PASP 40 mmHg  . HTN (hypertension)   . Chronic respiratory failure     Past Surgical History  Procedure Laterality Date  . Oophorectomy    . Nose surgery    . Skin surgery    . Gallbladder surgery    . Uvulopalatopharyngoplasty    . Cataract surgery      Family History  Problem Relation Age of Onset  . Heart attack Father   . Heart attack Brother     History   Social History  . Marital Status: Widowed    Spouse Name: N/A    Number of Children: N/A  . Years of Education: N/A   Occupational History  . Not on file.   Social History Main Topics  . Smoking status: Former Smoker -- 1.00 packs/day for 2 years    Types: Cigarettes    Quit date: 04/03/1981  . Smokeless tobacco: Never Used  . Alcohol Use: No  . Drug Use: No  . Sexual Activity: Not Currently   Other Topics Concern  . Not on file   Social History Narrative    Review of systems: Tenderness in left upper quadrant corresponding to the tip of her 11th rib. She complains of palpitations, occasional dizziness,  chronic shortness of breath, intermittent lower extremity edema. She does not have chest pain. She has occasional wheezing but currently does not have cough or hemoptysis. She has never experienced a full-blown syncope.  The patient also denies abdominal pain, nausea, vomiting, dysphagia, diarrhea, constipation, polyuria, polydipsia, dysuria, hematuria, frequency, urgency, abnormal bleeding or bruising, fever, chills, unexpected weight changes, mood swings, change in skin or hair texture, change in voice quality,  auditory or visual problems, allergic reactions or rashes, new musculoskeletal complaints other than usual "aches and pains".  PHYSICAL EXAM BP 155/74 mmHg  Pulse 78  Ht _0  (1.676 m)  Wt 292 lb (132.45 kg)  BMI 47.15 kg/m2 Morbid obesity limits the physical exam  General: Alert, oriented x3, no distress  Head: no evidence of trauma, PERRL, EOMI, no exophtalmos or lid lag, no myxedema, no xanthelasma; normal ears, nose and oropharynx  Neck: jugular venous pulsations are difficult to see; brisk carotid pulses without delay and no carotid bruits  Chest: clear to auscultation, no signs of consolidation by percussion or palpation, normal fremitus, symmetrical and full respiratory excursions  Cardiovascular: Unable to locate the apical impulse, regular rhythm, normal first and loud second heart sounds, no murmurs, rubs or gallops  Abdomen: no tenderness or distention, no masses by palpation, no abnormal pulsatility or arterial bruits, normal bowel sounds, no hepatosplenomegaly  Extremities: no clubbing, cyanosis; symmetrical 1-2 plus ankle and pedal edema; 2+ radial, ulnar and brachial pulses bilaterally; 2+ right femoral, posterior tibial and dorsalis pedis pulses; 2+ left femoral, posterior tibial and dorsalis pedis pulses; no subclavian or femoral bruits  Neurological: grossly nonfocal    Lipid Panel     Component Value Date/Time   CHOL 205* 08/30/2012 0550   TRIG 102  08/30/2012 0550   HDL 55 08/30/2012 0550   CHOLHDL 3.7 08/30/2012 0550   VLDL 20 08/30/2012 0550   LDLCALC 130* 08/30/2012 0550    BMET    Component Value Date/Time   NA 141 02/24/2014 1543   K 4.1 02/24/2014 1543   CL 102 02/24/2014 1543   CO2 31 02/24/2014 1543   GLUCOSE 96 02/24/2014 1543   BUN 12 02/24/2014 1543   CREATININE 0.7 02/24/2014 1543   CALCIUM 8.9 02/24/2014 1543   GFRNONAA 81* 08/29/2012 0910   GFRAA >90 08/29/2012 0910     ASSESSMENT AND PLAN   Mrs. Dai has chronic multifactorial respiratory failure with hypoxia (obesity, previous pulmonary embolism, COPD, OSA) and secondary right heart failure and arrhythmia. Her arrhythmia subsides when she has better respiratory status, typical for ectopic atrial tachycardia. She responds well to treatment of hypervolemia also.  Very difficult to establish her "dry weight" due to erratic weight recordings. Now understand that this is related to inaccurate weight readings since she is leaning on something/somebody when using her home scale. She does appear to be mildly hypervolemic today, probably 7-8 pounds over in fluid. Encouraged her to wait without support first establish a better baseline. She is worried about taking more furosemide but I reassured her that the current amount she is taking is very low.  She already has a prescription for meclizine. The pattern of her dizziness is clearly consistent with a vestibular problem with associated tinnitus and vertigo upon change in position of the head.    Holli Humbles, MD, [place] [phone] office [phone] pager

## 2014-05-01 NOTE — Patient Instructions (Signed)
INCREASE Furosemide to 34m if your weight is greater than 263 lbs.  Dr. CSallyanne Kusterrecommends that you schedule a follow-up appointment in: 6 months.

## 2014-05-06 ENCOUNTER — Encounter: Payer: Self-pay | Admitting: Cardiovascular Disease

## 2014-05-11 ENCOUNTER — Ambulatory Visit: Payer: Medicare Other | Admitting: Internal Medicine

## 2014-06-03 ENCOUNTER — Telehealth: Payer: Self-pay | Admitting: Cardiovascular Disease

## 2014-06-03 NOTE — Telephone Encounter (Signed)
Please call,pt is very concerned about her blood pressure. Today it is 153/90 and heart rate is fine,it is 72, She said she one sharp pain in her chest,but it did not long.Marland Kitchen

## 2014-06-03 NOTE — Telephone Encounter (Signed)
--  Pt reported concern over BP: 153/90 this AM.  She states recently BPs have been running about this range and she does not recall them ever being this high. No symptoms reported. Advised BPs are a little high, would defer to Dr. Loletha Grayer to recommend any med adjustments.  --Reports HR 72 - she was concerned about this, I stated rate was OK.  --Reported twinge of pain over left ribcage ~2-3 seconds in duration that came and went this AM. Advised likely [facility] but would bring to Dr. Lurline Del attention also  She has been seen recently in January.  Has pulmonology visit w/ Dr. Annamaria Boots coming up in 3 weeks.

## 2014-06-04 ENCOUNTER — Other Ambulatory Visit: Payer: Self-pay

## 2014-06-04 DIAGNOSIS — I10 Essential (primary) hypertension: Secondary | ICD-10-CM

## 2014-06-04 DIAGNOSIS — R0602 Shortness of breath: Secondary | ICD-10-CM

## 2014-06-04 MED ORDER — AMLODIPINE BESYLATE 5 MG PO TABS: 5.0000 mg | ORAL_TABLET | Freq: Every day | ORAL | Status: DC

## 2014-06-04 MED ORDER — FUROSEMIDE 20 MG PO TABS: 20.0000 mg | ORAL_TABLET | Freq: Every day | ORAL | Status: DC

## 2014-06-04 NOTE — Telephone Encounter (Signed)
Rx(s) sent to pharmacy electronically.

## 2014-06-04 NOTE — Telephone Encounter (Signed)
Spoke with patient. Increased medications per Dr. Loletha Grayer  Patient voiced understanding and she will take 2 tablets (37m) of amlodipine until she runs out of Rx and then she will take 1 tablet (572m when she picks up her new Rx.  Dose of furosemide was clarified with patient as well.

## 2014-06-04 NOTE — Telephone Encounter (Signed)
Pt said she called yesterday and talked to somebody,but she is still waiting to hear something.

## 2014-06-04 NOTE — Telephone Encounter (Signed)
Please increase amlodipine to 5 mg daily

## 2014-06-15 ENCOUNTER — Telehealth: Payer: Self-pay | Admitting: Cardiovascular Disease

## 2014-06-15 MED ORDER — DILTIAZEM HCL ER COATED BEADS 120 MG PO CP24: 120.0000 mg | ORAL_CAPSULE | Freq: Two times a day (BID) | ORAL | Status: DC

## 2014-06-15 NOTE — Telephone Encounter (Signed)
1. Which medications need to be refilled? Diltiazem/ HCL CD 167m CER-Take one capsule twice a day   2. Which pharmacy is medication to be sent to?[facility] [phone] 3. Do they need a 30 day or 90 day supply? 90  4. Would they like a call back once the medication has been sent to the pharmacy?Yes

## 2014-06-15 NOTE — Telephone Encounter (Signed)
Refill submitted to patient's preferred pharmacy. Informed patient. Pt voiced understanding, no other stated concerns at this time.

## 2014-06-19 ENCOUNTER — Telehealth: Payer: Self-pay | Admitting: Cardiovascular Disease

## 2014-06-19 NOTE — Telephone Encounter (Signed)
Please call,her blood pressure been up this week,Pt said last night she had a pressure and aching in her chest.

## 2014-06-19 NOTE — Telephone Encounter (Signed)
Associated w/ eating meal, pt had midsternal ache ~30 minutes w/ no other symptoms. This resolved at rest. Spoke w/ Dr. Loletha Grayer, [place] w/ no f/u. Advised pt if symptoms return to notify, if other symptoms associated or concerned, seek ED eval.  Pt also had q's regarding wrist BP cuff. In short, BP cuff she has at home probably not working optimally. I suggested she could bring it in for check in office, she stated she would rather just get a new one. I stated this would be fine.  Gave suggestion of [facility] or pharmacy to buy new cuff. Patient to obtain at her convenience.

## 2014-06-19 NOTE — Telephone Encounter (Signed)
Called, no answer.

## 2014-06-25 ENCOUNTER — Encounter: Payer: Self-pay | Admitting: Internal Medicine

## 2014-06-25 ENCOUNTER — Encounter (INDEPENDENT_AMBULATORY_CARE_PROVIDER_SITE_OTHER): Payer: Self-pay

## 2014-06-25 ENCOUNTER — Ambulatory Visit (INDEPENDENT_AMBULATORY_CARE_PROVIDER_SITE_OTHER): Payer: Medicare Other | Admitting: Internal Medicine

## 2014-06-25 VITALS — BP 142/70 | HR 63 | Ht 64.0 in | Wt 287.0 lb

## 2014-06-25 DIAGNOSIS — J9611 Chronic respiratory failure with hypoxia: Secondary | ICD-10-CM | POA: Diagnosis not present

## 2014-06-25 DIAGNOSIS — G4733 Obstructive sleep apnea (adult) (pediatric): Secondary | ICD-10-CM

## 2014-06-25 NOTE — Assessment & Plan Note (Signed)
Good compliance and control. CPAP with O2 remains medically necessary

## 2014-06-25 NOTE — Patient Instructions (Signed)
We will continue CPAP 9/ O2 4/ [facility] If the download suggests a change we will let you know.

## 2014-06-25 NOTE — Assessment & Plan Note (Signed)
Remains O2 dependent. I don't think the discomfort she reports on waking is pulmonary. Suspect she  May be feeling atenolol wear off.

## 2014-06-25 NOTE — Progress Notes (Signed)
11/26/13- [name] former smoker seen on kind referral from Dr Delray Alt beating fast and check up on CPAP. Former patient as well. Caregiver Jacqueline Nguyen is here Medical problems include obesity, hx PE/ coumadin, PHTN, PAF, HTN Originally dx'd OSA several years ago by Dr Jaynie Collins. Has been using CPAP 9/ and continuous O2 2L/ [facility]. Diagnostic NPSG being sought.  Bedtime is irregular, short sleep latency, waking only once for meds before up around 9AM. Sleeps very soundly after hydrocodone and lorazepam at bedtime.  Wakes with malaise and aware of heart racing often as she wakes, but not aware of this during the night. She is concerned, because she wakes like this, that there is something wrong with her CPAP or that it might fail. She insists she breathes and sleeps better with CPAP, used all night, every night. Caregiver says she does not snore while wearing it. No ENT surgery. Smoked 1ppd, quitting 1983. Unclear if dx'd lung disease. She is very limited by multiple medical problems and obesity, with little activity using a walker and portable O2. Hard cough/ scant white sputum w never blood or purulent. No wheeze. Treated at [facility] this summer for possible viral pneumonia with interstitial infiltrates.Taking Flovent HFA, Symbicort, ventolin HFA, Xopenex neb, but can't tell they do much.  Office Spirometry 11/26/13- severe restriction w FVC 1.43/ 49%, FEV1 0.95/ 45%, FEV1/FVC 0.66, FEF25-75% 0.52/33%, but poor technique with delayed exhalation so this may not be best capability.  01/13/14- 29 yo F former smoker followed for OSA, Chronic resp failure, complicated by PAF, PHTN/ coumadin, morbid obesity   Caregiver Jacqueline Nguyen is here FOLLOWS FOR: wears CPAP 9/ [facility] machine; DME is [facility]. Pt continues to wear her O2 2L with activity and at night. Download confirms good CPAP compliance and control. Feels bad as she first wakes up with rapid heart rate "80-90" sleeps in a recliner, tilted back. CXR  11/26/13 IMPRESSION:  Underlying chronic interstitial coarsening with mild vascular  congestion.  Electronically Signed  By: Misty Stanley M.D.  On: 11/26/2013 13:35  02/02/14- 85 yo F former smoker followed for OSA, Chronic resp failure, complicated by PAF, PHTN/ coumadin, morbid obesity   Caregiver Jacqueline Nguyen is here wearing CPAP 9/ [facility] every night with 3 lpm O2; has not yet increased to 4lpm.  Doesn't feel rested when waking up -- has palpitations, shakiness, and doesn't feel good in the mornings Still notes malaise as she sits up edge of bed in AM- orthostatic, not respiratory. Not known diabetic.  Declines flu vax.  CTa chest 01/14/14 IMPRESSION: No evidence of pulmonary embolus. Electronically Signed  By: Rolm Baptise M.D.  On: 01/14/2014 15:02  06/25/14-82 yo F former smoker followed for OSA, Chronic resp failure, complicated by PAF, PHTN/ coumadin, morbid obesity   Caregiver Jacqueline Nguyen is here FOLLOWS FOR wears CPAP 9/ [facility] every night with O2 4lpm. Doesn't feel rested when waking up- still having palpatations and racing heart in the morning.  Sleeps soundly in recliner for 6 hours. Wakes with pounding heart sensation- 79/ minute- before she sits up. This may be as her atenolol q6 hour med wears off from bedtime dose. Not aware of shortness of breath with this feeling.    ROS-see HPI Constitutional:   No-   weight loss, night sweats, fevers, chills, fatigue, lassitude. HEENT:   No-  headaches, difficulty swallowing, tooth/dental problems, sore throat,       No-  sneezing, itching, ear ache, nasal congestion, post nasal drip,  CV:  No-  chest pain, orthopnea, PND, +swelling in lower extremities, anasarca,                                                      dizziness, +palpitations Resp: +shortness of breath with exertion or at rest.              No-   productive cough,  No non-productive cough,  No- coughing up of blood.              No-   change in color of mucus.  No-  wheezing.   Skin: No-   rash or lesions. GI:  No-   heartburn, indigestion, abdominal pain, nausea, vomiting,  GU:  MS:  No-   joint pain or swelling.  . Neuro-     nothing unusual Psych:  No- change in mood or affect. No depression or anxiety.  No memory loss.  OBJ- Physical Exam   Talkative, + morbidly obese General- Alert, Oriented, Affect-appropriate, Distress- none acute,  walker, O2 2L/90% Skin- rash-none, lesions- none, excoriation- none Lymphadenopathy- none Head- atraumatic            Eyes- Gross vision intact, PERRLA, conjunctivae and secretions clear            Ears- Hearing, canals-normal            Nose- Clear, no-Septal dev, mucus, polyps, erosion, perforation             Throat- Mallampati II , mucosa clear , drainage- none, tonsils- atrophic Neck- flexible , trachea midline, no stridor , thyroid nl, carotid no bruit Chest - symmetrical excursion , unlabored           Heart/CV- RRR , no murmur , no gallop  , no rub, nl s1 s2                           - JVD- none , edema+1-2, stasis changes- none, varices- none           Lung- clear to P&A, wheeze- none, cough- none , dullness-none, rub- none           Chest wall-  Abd-  Br/ Gen/ Rectal- Not done, not indicated Extrem- cyanosis- none, clubbing, none, atrophy- none, strength- nl, +elastic hose, walker Neuro- grossly intact to observation

## 2014-07-10 ENCOUNTER — Ambulatory Visit: Payer: Medicare Other | Admitting: Nurse Practitioner

## 2014-07-10 ENCOUNTER — Telehealth: Payer: Self-pay | Admitting: Cardiovascular Disease

## 2014-07-10 ENCOUNTER — Ambulatory Visit: Payer: Medicare Other | Admitting: Cardiology

## 2014-07-10 ENCOUNTER — Ambulatory Visit (INDEPENDENT_AMBULATORY_CARE_PROVIDER_SITE_OTHER): Payer: Medicare Other | Admitting: Cardiology

## 2014-07-10 ENCOUNTER — Encounter: Payer: Self-pay | Admitting: Cardiology

## 2014-07-10 VITALS — BP 140/62 | HR 55 | Ht 66.0 in | Wt 283.0 lb

## 2014-07-10 DIAGNOSIS — R072 Precordial pain: Secondary | ICD-10-CM | POA: Diagnosis not present

## 2014-07-10 NOTE — Progress Notes (Signed)
Cardiology Office Note   Date:  07/10/2014   ID:  Jacqueline Nguyen, DOB July 05, 1931, MRN [id number]  PCP:  Ardith Dark PA-C  Cardiologist:   Minus Breeding, MD   Chief Complaint  Patient presents with  . Chest Pain      History of Present Illness: Jacqueline Nguyen is a 81 y.o. female who presents for chest pain.    She said yesterday about 20 minutes after she was done swallowing acyclovir which she says is large she had some discomfort. This was sharp. It lasted for about 45 minutes. She felt cramping and it was all across her chest. There was some mild nausea. Does not like pain she had before. There was no radiation to her jaw or to her arms. She was not particularly short of breath and didn't have any new PND or orthopnea. Went away for the most part over time that she has still a little residual soreness. She's not had any new cough fevers or chills. She does have chronic mucus problems.   Past Medical History  Diagnosis Date  . Obesities, morbid   . Skin cancer   . Cataract   . OSA (obstructive sleep apnea)   . Osteoporosis   . Arthritis   . Vertigo   . Hx pulmonary embolism     on chronic coumadin  . Endometriosis   . PAF (paroxysmal atrial fibrillation)     Event Monitor (8/15):  Frequent PACs, brief bursts of nonsustained ATach; no AFib  . Anticoagulated on Coumadin   . Psoriasis   . Hx of echocardiogram     Echo (8/15):  Mild LVH, EF 65%, no RWMA, Ao sclerosis, no AS, mod MAC, mild LAE, normal RVSF, PASP 40 mmHg  . HTN (hypertension)   . Chronic respiratory failure     Past Surgical History  Procedure Laterality Date  . Oophorectomy    . Nose surgery    . Skin surgery    . Gallbladder surgery    . Uvulopalatopharyngoplasty    . Cataract surgery       Current Outpatient Prescriptions  Medication Sig Dispense Refill  . albuterol (PROVENTIL HFA;VENTOLIN HFA) 108 (90 BASE) MCG/ACT inhaler Inhale 2 puffs into the lungs every 6 (six) hours as needed for  wheezing.    Marland Kitchen amLODipine (NORVASC) 5 MG tablet Take 1 tablet (5 mg total) by mouth daily. 30 tablet 6  . Ascorbic Acid (VITAMIN C PO) Take 600 mg by mouth daily.    Marland Kitchen atenolol (TENORMIN) 25 MG tablet Take 25 mg by mouth 4 (four) times daily.     . brimonidine-timolol (COMBIGAN) 0.2-0.5 % ophthalmic solution Place 1 drop into both eyes every 12 (twelve) hours.     . brinzolamide (AZOPT) 1 % ophthalmic suspension Place 1 drop into both eyes 2 (two) times daily.     . budesonide-formoterol (SYMBICORT) 160-4.5 MCG/ACT inhaler Inhale 2 puffs into the lungs at bedtime.     Marland Kitchen diltiazem (CARDIZEM CD) 120 MG 24 hr capsule Take 1 capsule (120 mg total) by mouth 2 (two) times daily. 180 capsule 1  . fluticasone (FLOVENT HFA) 44 MCG/ACT inhaler Inhale 2 puffs into the lungs 2 (two) times daily.    . furosemide (LASIX) 20 MG tablet Take 1 tablet (20 mg total) by mouth daily. 30 tablet 11  . Homeopathic Products (ARNICARE ARNICA) CREA Apply 1 application topically daily.     Marland Kitchen HYDROcodone-acetaminophen (NORCO) 7.5-325 MG per tablet Take by mouth.    Marland Kitchen  levalbuterol (XOPENEX) 0.63 MG/3ML nebulizer solution Take 1 ampule by nebulization every 8 (eight) hours as needed for wheezing.    Marland Kitchen LORazepam (ATIVAN) 1 MG tablet TAKE ONE TABLET FOUR TIMES DAILY AS NEEDED    . meclizine (ANTIVERT) 25 MG tablet TAKE ONE TABLET 2-3 TIMES A DAY AS NEEDED FOR VERTIGO    . Multiple Minerals (MINERAL COMPLEX PO) Take 1 tablet by mouth daily.     . NON FORMULARY Place 4 L into the nose at bedtime. 3 liter as needed during day depending on extertion    . Nystatin (Dixon) 100000 UNIT/GM POWD Apply topically.    Marland Kitchen OLIVE LEAF EXTRACT PO Take 450 mg by mouth daily.    Marland Kitchen sulfamethoxazole-trimethoprim (BACTRIM DS,SEPTRA DS) 800-160 MG per tablet Take by mouth.    . Turmeric 500 MG CAPS Take 500 mg by mouth daily as needed.    . warfarin (COUMADIN) 2.5 MG tablet Take 2.5 mg by mouth as directed.      No current facility-administered  medications for this visit.    Allergies:   Ciprofloxacin; Bactrim; and Codeine    ROS:  Please see the history of present illness.   Otherwise, review of systems are positive for none.   All other systems are reviewed and negative.    PHYSICAL EXAM: VS:  BP 140/62 mmHg  Pulse 55  Ht _0  (1.676 m)  Wt 283 lb (128.368 kg)  BMI 45.70 kg/m2 , BMI Body mass index is 45.7 kg/(m^2). GEN:  No distress NECK:  No jugular venous distention at 90 degrees, waveform within normal limits, carotid upstroke brisk and symmetric, no bruits, no thyromegaly LUNGS:  Clear to auscultation bilaterally BACK:  No CVA tenderness CHEST:  Unremarkable HEART:  S1 and S2 within normal limits, no S3, no S4, no clicks, no rubs, no murmurs ABD:  Positive bowel sounds normal in frequency in pitch, no bruits, no rebound, no guarding, unable to assess midline mass or bruit with the patient seated. EXT:  2 plus pulses throughout, no edema, no cyanosis no clubbing   EKG:  EKG is ordered today. The ekg ordered today demonstrates sinus bradycardia, rate 55, axis within normal limits, intervals within normal limits, no acute ST-T wave changes.   Recent Labs: 08/20/2013: ALT 12; Hemoglobin 13.4; Platelets 343.0; TSH 0.95 02/24/2014: BUN 12; Creatinine 0.7; Potassium 4.1; Pro B Natriuretic peptide (BNP) 118.0*; Sodium 141     Wt Readings from Last 3 Encounters:  07/10/14 283 lb (128.368 kg)  06/25/14 287 lb (130.182 kg)  05/01/14 292 lb (132.45 kg)      Other studies Reviewed: Additional studies/ records that were reviewed today include: None.   ASSESSMENT AND PLAN:  CHEST PAIN:  She was added on today for evaluation of chest discomfort. However, this is very atypical for any obstructive coronary disease or acute coronary syndrome. I suspect this may be related to the difficulty she had swallowing a large pill area her EKG is unremarkable. Her exam she is (compromised by her weight) is not remarkable.  At this  point I don't think further cardiovascular testing would be helpful. No change in medications is indicated. She's going to go home and try to swallow something to see if she has any difficulty with this. She has recurrent discomfort with this she will call her primary care physician. I will have her come back to see Dr. Sallyanne Kuster in about 2 weeks.   Current medicines are reviewed at length with the patient today.  The patient does not have concerns regarding medicines.  The following changes have been made:  no change  Labs/ tests ordered today include: None   Disposition:   FU with Dr. Sallyanne Kuster in two weeks.     Signed, Minus Breeding, MD  07/10/2014 11:56 AM    [place]

## 2014-07-10 NOTE — Telephone Encounter (Signed)
Spoke with DOD who reviewed patient's symptoms and did not feel her complaints were cardiac in nature.   This was communicated to patient who was quite insistent that she be seen today for eval. She would like labs and an EKG. She does not wish to monitor symptoms at home or go to ED for eval. She states she has never has this pain before and wants NTG - even though it was explained to her this medication is given to patient's with h/o CAD or blockages/stents to use as needed for chest pain to dilate or open up arteries.   Patient was offered 1115 appointment with Dr. Percival Spanish - DOD does not have openings. Patient states she would be hard pressed to make this appointment time so flex scheduled appointment was made and patient then changed mind and wished to be seen in [facility] office. Patient scheduled once again for 1115 with Dr. Percival Spanish on 4/8

## 2014-07-10 NOTE — Telephone Encounter (Signed)
Spoke with patient who states she had cramping, squeezing pain in her chest last nite lasting 45 mins - 1 hour. This began about 1 minute after taking 1/2 a pill for possible shingles. She had back pain and SOB around this time. The lady who stays with her some during the day came back yesterday evening and rubbed her back with provided some relief. Patient states she consulted her EMT/nurse friend who told her this was angina and told her she should take NTG if she has this pain - she does not have an Rx and does not have documented h/o CAD. Patient reports she has never had this pain before and she did not have relief of this cramping/squeezing in her chest with position change. She also called EMS last nite cause she laid down to rest and then woke up confused. She reports she felt like her BP was up at this time. She reports EMS checked her VS and her BP was BP 145/65 HR 76 and that EMS told her she should be seen today to have labs to check to see if she had a heart attack. She also reports her mouth is dry - uses CPAP.   Will defer to DOD to advise if patient should be seen in office, if this seems like cardiac pain, if should go to ED?

## 2014-07-10 NOTE — Patient Instructions (Signed)
Your physician recommends that you schedule a follow-up appointment in: 2-3 Weeks with Dr Sallyanne Kuster

## 2014-07-10 NOTE — Telephone Encounter (Signed)
Jacqueline Nguyen is calling because she is having some issues . Late yesterday afternoon she had some pain in her chest that felt like cramps which lasted for about 30-45 mins .Spoke with EMT on Last night .Would Like to see someone if possible . Please Call

## 2014-07-13 ENCOUNTER — Telehealth: Payer: Self-pay | Admitting: Cardiovascular Disease

## 2014-07-13 NOTE — Telephone Encounter (Signed)
Pt reports BPs, 140s/60-70. Advised OK. She is very concerned about her stroke risk, cites ongoing problems.  She has been followed about this in the past, also chronic dyspnea, and was just seen in office by Dr. Percival Spanish as an add-on Friday for acute concerns r/t CP.  Pt reports taking extra doses of atenolol 19m - up to 6 or 7 times a day. It is prescribed QID. I advised no real reason to do extra doses, would be better for her to make sure she is spreading out prescribed doses appropriately. She hesitantly voiced agreement w/ this. She asked about taking larger dose of amlodipine. This was only recently increased from 2.539mto 6m49maily. Advised to continue to monitor BPs, keep track of these w/ consistent administration of current meds. If systolic elevated much more, consistently, or if problems, to call. O/w, she has appt to see Dr. CroSallyanne Kuster 4/25. I assured pt it would be appropriate to wait until f/u to address, if no new problems. Will defer to Dr. CroSallyanne Kusterr advisement & inform pt of any recommendations.

## 2014-07-13 NOTE — Telephone Encounter (Signed)
Please call,pt is very concerned about her blood pressure staying high.

## 2014-07-16 NOTE — Telephone Encounter (Signed)
Gave instruction regarding Dr. Victorino December advice. Pt voiced understanding; also reports since spreading doses out for Atenolol, she is feeling a lot better.   Reconfirmed her appt for 4/25. Told to call if she has new concerns.

## 2014-07-16 NOTE — Telephone Encounter (Signed)
I think she should stay on current dose of meds. BP is not worrisome

## 2014-07-27 ENCOUNTER — Ambulatory Visit: Payer: Medicare Other | Admitting: Cardiovascular Disease

## 2014-09-07 ENCOUNTER — Telehealth: Payer: Self-pay | Admitting: Internal Medicine

## 2014-09-07 DIAGNOSIS — G4733 Obstructive sleep apnea (adult) (pediatric): Secondary | ICD-10-CM

## 2014-09-07 NOTE — Telephone Encounter (Signed)
Spoke with Cherlyn Roberts with [facility].  Cyril Mourning is with a pt.  She will have Kristen call back.

## 2014-09-08 NOTE — Telephone Encounter (Signed)
Pt is calling back [phone]

## 2014-09-08 NOTE — Telephone Encounter (Signed)
Pt feels that pressure setting of her CPAP machine is too high. Pt having increased headaches and has aggravated her vertigo.  Pt feels that her BP is high as well when she feels this way.  CPAP pressure is on Auto per [facility] - pt spoke with them yesterday and they told her she may need a titration study to have pressure adjusted.  Pt states that if she has to go the sleep center for this test then she will need to sleep in a recliner if they offer that option.  Needs a Monday night if test is needed to be done. Please advise Dr young. Thanks.

## 2014-09-08 NOTE — Telephone Encounter (Signed)
Patient aware that we are sending an order for DME to do Autotitration.  Nothing further needed.

## 2014-09-08 NOTE — Telephone Encounter (Signed)
Please ask her DME to autotitrate 5-20 cwp x 7 days for pressure compliance assessment    Dx OSA

## 2014-09-11 ENCOUNTER — Telehealth: Payer: Self-pay | Admitting: Internal Medicine

## 2014-09-11 NOTE — Telephone Encounter (Signed)
Pt was confused on why [facility] came out to her home. They came out to do an auto titration per [facility] order. Nothing further needed at this time.

## 2014-09-14 ENCOUNTER — Ambulatory Visit (INDEPENDENT_AMBULATORY_CARE_PROVIDER_SITE_OTHER): Payer: Medicare Other | Admitting: Cardiovascular Disease

## 2014-09-14 ENCOUNTER — Encounter: Payer: Self-pay | Admitting: Cardiovascular Disease

## 2014-09-14 VITALS — BP 144/74 | HR 69 | Resp 16 | Ht 65.0 in | Wt 271.4 lb

## 2014-09-14 DIAGNOSIS — I272 Pulmonary hypertension, unspecified: Secondary | ICD-10-CM

## 2014-09-14 DIAGNOSIS — I270 Primary pulmonary hypertension: Secondary | ICD-10-CM

## 2014-09-14 DIAGNOSIS — Z86711 Personal history of pulmonary embolism: Secondary | ICD-10-CM

## 2014-09-14 DIAGNOSIS — G4733 Obstructive sleep apnea (adult) (pediatric): Secondary | ICD-10-CM | POA: Diagnosis not present

## 2014-09-14 DIAGNOSIS — J9611 Chronic respiratory failure with hypoxia: Secondary | ICD-10-CM

## 2014-09-14 DIAGNOSIS — I10 Essential (primary) hypertension: Secondary | ICD-10-CM

## 2014-09-14 DIAGNOSIS — I480 Paroxysmal atrial fibrillation: Secondary | ICD-10-CM

## 2014-09-14 DIAGNOSIS — Z5181 Encounter for therapeutic drug level monitoring: Secondary | ICD-10-CM

## 2014-09-14 DIAGNOSIS — H538 Other visual disturbances: Secondary | ICD-10-CM

## 2014-09-14 DIAGNOSIS — Z7901 Long term (current) use of anticoagulants: Secondary | ICD-10-CM

## 2014-09-14 NOTE — Progress Notes (Signed)
Patient ID: Jacqueline Nguyen, female   DOB: 04/06/1931, 81 y.o.   MRN: [id number]     Cardiology Office Note   Date:  09/14/2014   ID:  Jacqueline Nguyen, DOB [birth year], MRN [id number]  PCP:  Sheral Apley  Cardiologist:   Sanda Klein, MD   Chief Complaint  Patient presents with  . Follow-up    no chest discomfort,swelling bilateral no pain on legs, their has been a change wit cpap machine      History of Present Illness: Jacqueline Nguyen is a 81 y.o. female who presents for follow-up on right heart failure (secondary to obstructive sleep apnea, previous pulmonary embolism, obesity) and reported paroxysmal atrial fibrillation. From a cardiac point of view she has generally felt well although her swelling is occasionally more severe. Her blood pressure is well controlled. Her episodes of arrhythmia appear to be diminishing since she had some type of adjustment done to her CPAP prescription. She continues to complain of fatigue. She does not have chest pain (he was seen in the office by one of my colleagues on April 8 with complaints of atypical chest pain). She has chronic dyspnea with minimal activity (functional class III). She uses oxygen intermittently with activity.  All of her complaints today revolve around noncardiac issues, especially difficulty remaining asleep at night and headaches. She has not had any bleeding or focal neurological events while on treatment with warfarin (prescribed for history of pulmonary embolism and possible previous atrial fibrillation).  In 2009 she had a "massive" bilateral pulmonary embolism and she has long-standing systemic hypertension, morbid obesity, reactive airway disease, obstructive sleep apnea (on CPAP) and osteoporosis and degenerative arthritis. She wears oxygen at night and with activity and often uses a scooter. In 2014 she had transient right eye blindness, not long after cataract surgery. Has glaucoma. She attributed it to treatment with Mobic.  Echo in August 2015 showed normal left ventricular regional wall motion and EF 65%, no significant valvular abnormalities, estimated systolic PA pressure 40 mm Hg. Her event monitor showed frequent PACs and brief bursts of nonsustained atrial tachycardia, but there was no confirmed atrial fibrillation.   Past Medical History  Diagnosis Date  . Obesities, morbid   . Skin cancer   . Cataract   . OSA (obstructive sleep apnea)   . Osteoporosis   . Arthritis   . Vertigo   . Hx pulmonary embolism     on chronic coumadin  . Endometriosis   . PAF (paroxysmal atrial fibrillation)     Event Monitor (8/15):  Frequent PACs, brief bursts of nonsustained ATach; no AFib  . Psoriasis   . Hx of echocardiogram     Echo (8/15):  Mild LVH, EF 65%, no RWMA, Ao sclerosis, no AS, mod MAC, mild LAE, normal RVSF, PASP 40 mmHg  . HTN (hypertension)   . Chronic respiratory failure     Past Surgical History  Procedure Laterality Date  . Oophorectomy    . Nose surgery    . Skin surgery    . Gallbladder surgery    . Uvulopalatopharyngoplasty    . Cataract surgery       Current Outpatient Prescriptions  Medication Sig Dispense Refill  . albuterol (PROVENTIL HFA;VENTOLIN HFA) 108 (90 BASE) MCG/ACT inhaler Inhale 2 puffs into the lungs every 6 (six) hours as needed for wheezing.    Marland Kitchen amLODipine (NORVASC) 5 MG tablet Take 1 tablet (5 mg total) by mouth daily. 30 tablet 6  . Ascorbic Acid (VITAMIN C PO) Take  600 mg by mouth daily.    Marland Kitchen atenolol (TENORMIN) 25 MG tablet Take 25 mg by mouth 4 (four) times daily.     . brimonidine-timolol (COMBIGAN) 0.2-0.5 % ophthalmic solution Place 1 drop into both eyes every 12 (twelve) hours.     . brinzolamide (AZOPT) 1 % ophthalmic suspension Place 1 drop into both eyes 2 (two) times daily.     . budesonide-formoterol (SYMBICORT) 160-4.5 MCG/ACT inhaler Inhale 2 puffs into the lungs at bedtime.     Marland Kitchen diltiazem (CARDIZEM CD) 120 MG 24 hr capsule Take 1 capsule (120 mg  total) by mouth 2 (two) times daily. 180 capsule 1  . fluticasone (FLOVENT HFA) 44 MCG/ACT inhaler Inhale 2 puffs into the lungs 2 (two) times daily.    . fluticasone (FLOVENT HFA) 44 MCG/ACT inhaler Take 2 puffs by mouth 2 (two) times daily.    . furosemide (LASIX) 20 MG tablet Take 1 tablet (20 mg total) by mouth daily. 30 tablet 11  . Homeopathic Products (ARNICARE ARNICA) CREA Apply 1 application topically daily.     Marland Kitchen levalbuterol (XOPENEX) 0.63 MG/3ML nebulizer solution Take 1 ampule by nebulization every 8 (eight) hours as needed for wheezing.    Marland Kitchen LORazepam (ATIVAN) 1 MG tablet TAKE ONE TABLET FOUR TIMES DAILY AS NEEDED    . meclizine (ANTIVERT) 25 MG tablet TAKE ONE TABLET 2-3 TIMES A DAY AS NEEDED FOR VERTIGO    . Multiple Minerals (MINERAL COMPLEX PO) Take 1 tablet by mouth daily.     . NON FORMULARY Place 4 L into the nose at bedtime. 3 liter as needed during day depending on extertion    . Nystatin (Hooper) 100000 UNIT/GM POWD Apply topically.    Marland Kitchen OLIVE LEAF EXTRACT PO Take 450 mg by mouth daily.    . Turmeric 500 MG CAPS Take 500 mg by mouth daily as needed.    . warfarin (COUMADIN) 2.5 MG tablet Take 2.5 mg by mouth as directed.     Marland Kitchen HYDROcodone-acetaminophen (NORCO) 7.5-325 MG per tablet Take 1 tablet by mouth daily.     No current facility-administered medications for this visit.    Allergies:   Ciprofloxacin; Bactrim; and Codeine    Social History:  The patient  reports that she quit smoking about 33 years ago. Her smoking use included Cigarettes. She has a 2 pack-year smoking history. She has never used smokeless tobacco. She reports that she does not drink alcohol or use illicit drugs.   Family History:  The patient's family history includes Heart attack in her brother and father.    ROS:  Please see the history of present illness.    Otherwise, review of systems positive for none.   All other systems are reviewed and negative.    PHYSICAL EXAM: VS:  BP 144/74  mmHg  Pulse 69  Resp 16  Ht _0  (1.651 m)  Wt 123.106 kg (271 lb 6.4 oz)  BMI 45.16 kg/m2 , BMI Body mass index is 45.16 kg/(m^2).  General: Alert, oriented x3, no distress Head: no evidence of trauma, PERRL, EOMI, no exophtalmos or lid lag, no myxedema, no xanthelasma; normal ears, nose and oropharynx Neck: normal jugular venous pulsations and no hepatojugular reflux; brisk carotid pulses without delay and no carotid bruits Chest: clear to auscultation, no signs of consolidation by percussion or palpation, normal fremitus, symmetrical and full respiratory excursions Cardiovascular: normal position and quality of the apical impulse, regular rhythm, normal first and second heart sounds, no murmurs,  rubs or gallops Abdomen: no tenderness or distention, no masses by palpation, no abnormal pulsatility or arterial bruits, normal bowel sounds, no hepatosplenomegaly Extremities: no clubbing, cyanosis or edema; 2+ radial, ulnar and brachial pulses bilaterally; 2+ right femoral, posterior tibial and dorsalis pedis pulses; 2+ left femoral, posterior tibial and dorsalis pedis pulses; no subclavian or femoral bruits Neurological: grossly nonfocal Psych: euthymic mood, full affect   EKG:  EKG is not ordered today.  Recent Labs: 02/24/2014: BUN 12; Creatinine, Ser 0.7; Potassium 4.1; Pro B Natriuretic peptide (BNP) 118.0*; Sodium 141    Lipid Panel    Component Value Date/Time   CHOL 205* 08/30/2012 0550   TRIG 102 08/30/2012 0550   HDL 55 08/30/2012 0550   CHOLHDL 3.7 08/30/2012 0550   VLDL 20 08/30/2012 0550   LDLCALC 130* 08/30/2012 0550      Wt Readings from Last 3 Encounters:  09/14/14 123.106 kg (271 lb 6.4 oz)  07/10/14 128.368 kg (283 lb)  06/25/14 130.182 kg (287 lb)     ASSESSMENT AND PLAN:  Mrs. Falin has chronic multifactorial respiratory failure with hypoxia (obesity, previous pulmonary embolism, COPD, OSA) and secondary right heart failure and arrhythmia. Her arrhythmia  subsides when she has better respiratory status, typical for ectopic atrial tachycardia. She responds well to treatment of hypervolemia also.  Current control of arrhythmia is satisfactory (on a combination of low-dose beta blocker and low-dose diltiazem, higher doses prevented by a tendency to sinus bradycardia). Symptoms of arrhythmia. Improving with the adjustment was made in her CPAP. She also takes amlodipine for blood pressure control (the combination of diltiazem and amlodipine appears counterintuitive, but other blood pressure medications have not been well tolerated).   Current medicines are reviewed at length with the patient today.  The patient does not have concerns regarding medicines.  The following changes have been made:  no change  Labs/ tests ordered today include:  No orders of the defined types were placed in this encounter.    Patient Instructions  Dr. Sallyanne Kuster recommends that you schedule a follow-up appointment in: 6 months      Signed, Sanda Klein, MD  09/14/2014 5:24 PM    Sanda Klein, MD, [facility] [phone] office [phone] pager

## 2014-09-14 NOTE — Patient Instructions (Signed)
Dr. Sallyanne Kuster recommends that you schedule a follow-up appointment in: 6 months

## 2014-09-21 ENCOUNTER — Telehealth: Payer: Self-pay | Admitting: Cardiovascular Disease

## 2014-09-21 ENCOUNTER — Encounter: Payer: Self-pay | Admitting: *Deleted

## 2014-09-21 NOTE — Telephone Encounter (Signed)
Returned call to speak to emergency contact - VM box full. Intended to advise that no DPR on file - also, since this is PHI would elect to send this information directly to patient should she need it.  Attempted to reach patient to discuss, no answer when dialed.

## 2014-09-21 NOTE — Telephone Encounter (Signed)
Pt was here last week and was suppose to get an e-mail with updated medications. She still have not receive d it. Please e-mail to [email or web address] and [email or web address]

## 2014-09-24 ENCOUNTER — Telehealth: Payer: Self-pay | Admitting: Cardiovascular Disease

## 2014-09-24 NOTE — Telephone Encounter (Signed)
LMTCB

## 2014-09-24 NOTE — Telephone Encounter (Signed)
Spoke with patient and explained nature of previous call from [place], her son. She reports he is trying to get FMLA for her, he works in [employer] and helps her with appointments/medications.   Explained that I do not have a DPR or authorization on file stating I can speak with him about her PHI so we decided it would be best to mail her a copy of her medication so she can give it to her son and mail a DPR form for her to complete and mail back. Address verified with patient.

## 2014-09-24 NOTE — Telephone Encounter (Signed)
Mrs. Fusilier is returning your call . Please call back and allow her some time to get to the phone , she sattes she is a little slow and if you have to leave a msg , please speak slowly .Marland Kitchen Thanks

## 2014-09-24 NOTE — Telephone Encounter (Signed)
Addressed in a previous encounter.

## 2014-10-06 ENCOUNTER — Telehealth: Payer: Self-pay | Admitting: Internal Medicine

## 2014-10-06 DIAGNOSIS — G4733 Obstructive sleep apnea (adult) (pediatric): Secondary | ICD-10-CM

## 2014-10-06 NOTE — Telephone Encounter (Signed)
Per CY >> reviewed CPAP download, change her CPAP to fixed 9cm.  Pt is aware of results. Order will be placed for CPAP change. Nothing further was needed.

## 2014-10-16 ENCOUNTER — Encounter: Payer: Self-pay | Admitting: Internal Medicine

## 2014-11-12 ENCOUNTER — Telehealth: Payer: Self-pay | Admitting: Cardiovascular Disease

## 2014-11-12 ENCOUNTER — Other Ambulatory Visit: Payer: Self-pay | Admitting: *Deleted

## 2014-11-12 MED ORDER — DILTIAZEM HCL ER COATED BEADS 120 MG PO CP24: 120.0000 mg | ORAL_CAPSULE | Freq: Two times a day (BID) | ORAL | Status: DC

## 2014-11-12 NOTE — Telephone Encounter (Signed)
Called pt. Systolic BP [phone]. Wants to know if OK.  Advised this is fine - she has no symptoms. Instructed to take meds as currently taking. Pt voiced understanding. Questions addressed to her satisfaction.

## 2014-11-12 NOTE — Telephone Encounter (Signed)
Please call,concern about her blood pressure readings.

## 2014-12-28 ENCOUNTER — Ambulatory Visit (INDEPENDENT_AMBULATORY_CARE_PROVIDER_SITE_OTHER): Payer: Medicare Other | Admitting: Internal Medicine

## 2014-12-28 ENCOUNTER — Encounter: Payer: Self-pay | Admitting: Internal Medicine

## 2014-12-28 ENCOUNTER — Other Ambulatory Visit: Payer: Medicare Other

## 2014-12-28 VITALS — BP 118/76 | HR 59 | Ht 68.0 in | Wt 269.4 lb

## 2014-12-28 DIAGNOSIS — J449 Chronic obstructive pulmonary disease, unspecified: Secondary | ICD-10-CM | POA: Insufficient documentation

## 2014-12-28 DIAGNOSIS — Z86718 Personal history of other venous thrombosis and embolism: Secondary | ICD-10-CM | POA: Insufficient documentation

## 2014-12-28 DIAGNOSIS — I82402 Acute embolism and thrombosis of unspecified deep veins of left lower extremity: Secondary | ICD-10-CM

## 2014-12-28 DIAGNOSIS — G4733 Obstructive sleep apnea (adult) (pediatric): Secondary | ICD-10-CM

## 2014-12-28 DIAGNOSIS — Z5181 Encounter for therapeutic drug level monitoring: Secondary | ICD-10-CM

## 2014-12-28 DIAGNOSIS — I82409 Acute embolism and thrombosis of unspecified deep veins of unspecified lower extremity: Secondary | ICD-10-CM | POA: Diagnosis not present

## 2014-12-28 DIAGNOSIS — J441 Chronic obstructive pulmonary disease with (acute) exacerbation: Secondary | ICD-10-CM

## 2014-12-28 DIAGNOSIS — Z7901 Long term (current) use of anticoagulants: Secondary | ICD-10-CM

## 2014-12-28 MED ORDER — COMPRESSOR NEBULIZER MISC: 1.0000 | Freq: Four times a day (QID) | Status: DC

## 2014-12-28 MED ORDER — FLUTTER DEVI: Status: AC

## 2014-12-28 NOTE — Assessment & Plan Note (Signed)
She reports INR 4.4 without bleeding and is directed to promptly contact Coumadin clinic.

## 2014-12-28 NOTE — Assessment & Plan Note (Signed)
She is describing persistent chest congestion and difficulty clearing secretions. Plan-try flutter device

## 2014-12-28 NOTE — Assessment & Plan Note (Signed)
She describes a firm area in the posterior left calf which I can barely palpate. Jacqueline Nguyen is negative. Her INR is actually long right now. Plan-d-dimer

## 2014-12-28 NOTE — Progress Notes (Signed)
11/26/13- [name] former smoker seen on kind referral from Dr Delray Alt beating fast and check up on CPAP. Former patient as well. Caregiver [name] is here Medical problems include obesity, hx PE/ coumadin, PHTN, PAF, HTN Originally dx'd OSA several years ago by Dr Jaynie Collins. Has been using CPAP 9/ and continuous O2 2L/ [facility]. Diagnostic NPSG being sought.  Bedtime is irregular, short sleep latency, waking only once for meds before up around 9AM. Sleeps very soundly after hydrocodone and lorazepam at bedtime.  Wakes with malaise and aware of heart racing often as she wakes, but not aware of this during the night. She is concerned, because she wakes like this, that there is something wrong with her CPAP or that it might fail. She insists she breathes and sleeps better with CPAP, used all night, every night. Caregiver says she does not snore while wearing it. No ENT surgery. Smoked 1ppd, quitting 1983. Unclear if dx'd lung disease. She is very limited by multiple medical problems and obesity, with little activity using a walker and portable O2. Hard cough/ scant white sputum w never blood or purulent. No wheeze. Treated at [facility] this summer for possible viral pneumonia with interstitial infiltrates.Taking Flovent HFA, Symbicort, ventolin HFA, Xopenex neb, but can't tell they do much.  Office Spirometry 11/26/13- severe restriction w FVC 1.43/ 49%, FEV1 0.95/ 45%, FEV1/FVC 0.66, FEF25-75% 0.52/33%, but poor technique with delayed exhalation so this may not be best capability.  01/13/14- 7 yo F former smoker followed for OSA, Chronic resp failure, complicated by PAF, PHTN/ coumadin, morbid obesity   Caregiver [name] is here FOLLOWS FOR: wears CPAP 9/ [facility] machine; DME is [facility]. Pt continues to wear her O2 2L with activity and at night. Download confirms good CPAP compliance and control. Feels bad as she first wakes up with rapid heart rate "80-90" sleeps in a recliner, tilted back. CXR  11/26/13 IMPRESSION:  Underlying chronic interstitial coarsening with mild vascular  congestion.  Electronically Signed  By: Misty Stanley M.D.  On: 11/26/2013 13:35  02/02/14- 64 yo F former smoker followed for OSA, Chronic resp failure, complicated by PAF, PHTN/ coumadin, morbid obesity   Caregiver [name] is here wearing CPAP 9/ [facility] every night with 3 lpm O2; has not yet increased to 4lpm.  Doesn't feel rested when waking up -- has palpitations, shakiness, and doesn't feel good in the mornings Still notes malaise as she sits up edge of bed in AM- orthostatic, not respiratory. Not known diabetic.  Declines flu vax.  CTa chest 01/14/14 IMPRESSION: No evidence of pulmonary embolus. Electronically Signed  By: Rolm Baptise M.D.  On: 01/14/2014 15:02  06/25/14-82 yo F former smoker followed for OSA, Chronic resp failure, complicated by PAF, PHTN/ coumadin, morbid obesity   Caregiver [name] is here FOLLOWS FOR wears CPAP 9/ [facility] every night with O2 4lpm. Doesn't feel rested when waking up- still having palpatations and racing heart in the morning.  Sleeps soundly in recliner for 6 hours. Wakes with pounding heart sensation- 79/ minute- before she sits up. This may be as her atenolol q6 hour med wears off from bedtime dose. Not aware of shortness of breath with this feeling.    12/28/14-83 yo F former smoker followed for OSA, Chronic resp failure, complicated by PAF, PHTN/ coumadin, morbid obesity   Caregiver [name] is here, son is here CPAP 9/ [facility] every night with O2 4lpm FOLLOWS FOR wears CPAP every night with 4lpm. States feeling rested in the  morning. Still has palpatations and racing heart in the morning  She still notices intermittent palpitations and they may wake her in the morning. She describes increased mucus production with morning cough and difficulty clearing her chest. Refuses flu vaccine "makes me sick". Rescue inhaler 2 or 3 times daily Concerned about nontender  knot in posterior left calf. She continues warfarin and says most recent INR was 4.4, pending contact with Coumadin clinic but without bleeding.  ROS-see HPI Constitutional:   No-   weight loss, night sweats, fevers, chills, fatigue, lassitude. HEENT:   No-  headaches, difficulty swallowing, tooth/dental problems, sore throat,       No-  sneezing, itching, ear ache, nasal congestion, post nasal drip,  CV:  No-   chest pain, orthopnea, PND, +swelling in lower extremities, anasarca,                                                                      dizziness, +palpitations Resp: +shortness of breath with exertion or at rest.              +   productive cough,  No non-productive cough,  No- coughing up of blood.              No-   change in color of mucus.  No- wheezing.   Skin: No-   rash or lesions. GI:  No-   heartburn, indigestion, abdominal pain, nausea, vomiting,  GU:  MS:  No-   joint pain or swelling.  . Neuro-     nothing unusual Psych:  No- change in mood or affect. No depression or anxiety.  No memory loss.  OBJ- Physical Exam   Talkative, + morbidly obese General- Alert, Oriented, Affect-appropriate, Distress- none acute,  walker, O2 2L/90% Skin- rash-none, lesions- none, excoriation- none Lymphadenopathy- none Head- atraumatic            Eyes- Gross vision intact, PERRLA, conjunctivae and secretions clear            Ears- Hearing, canals-normal            Nose- Clear, no-Septal dev, mucus, polyps, erosion, perforation             Throat- Mallampati II , mucosa clear , drainage- none, tonsils- atrophic Neck- flexible , trachea midline, no stridor , thyroid nl, carotid no bruit Chest - symmetrical excursion , unlabored           Heart/CV- RRR , no murmur , no gallop  , no rub, nl s1 s2                           - JVD- none , edema+1-2, stasis changes- none, varices- none           Lung- clear to P&A, wheeze- none, cough- none , dullness-none, rub- none           Chest wall-   Abd-  Br/ Gen/ Rectal- Not done, not indicated Extrem- cyanosis- none, clubbing, none, atrophy- none, strength- nl, +elastic hose, + wheelchair, Homans-Negative Neuro- grossly intact to observation

## 2014-12-28 NOTE — Assessment & Plan Note (Signed)
She continues CPAP 9/[facility] with supplemental oxygen at 3 L and describes good compliance and control.

## 2014-12-28 NOTE — Patient Instructions (Signed)
Order- lab     D-dimer   Dx DVT  Script for replacement nebulizer machine to be sent to [facility] for Flutter device to help clear secretions     Blow through 4 times per set, repeat 3 sets per day, while needed.

## 2014-12-29 ENCOUNTER — Telehealth: Payer: Self-pay | Admitting: Internal Medicine

## 2014-12-29 LAB — D-DIMER, QUANTITATIVE: D-Dimer, Quant: 0.27 ug/mL-FEU (ref 0.00–0.48)

## 2014-12-29 NOTE — Telephone Encounter (Signed)
Notes Recorded by Deneise Lever, MD on 12/29/2014 at 8:50 AM D-dimer blood test negative for blood clot. Ask PCP to manage the lump in calf as needed ---  I spoke with patient about results and she verbalized understanding and had no questions

## 2015-01-01 ENCOUNTER — Telehealth: Payer: Self-pay | Admitting: Cardiovascular Disease

## 2015-01-01 NOTE — Telephone Encounter (Signed)
Dr. Mariel Sleet needs a list of patients current cardiac medication  Printed off current EX faxed to [phone]

## 2015-02-01 ENCOUNTER — Telehealth: Payer: Self-pay | Admitting: Internal Medicine

## 2015-02-01 NOTE — Telephone Encounter (Signed)
I spoke with caregiver for patient in person at office and patient via telephone; they are aware that the papers will be completed and ready for pick up Tuesday 02-02-15 after 9:00am. Caregiver will be here after 9:00am Tuesday 02-02-15 to pick up. Copies made for our records and nothing more needed at this time.

## 2015-02-03 ENCOUNTER — Telehealth: Payer: Self-pay | Admitting: Internal Medicine

## 2015-02-03 NOTE — Telephone Encounter (Signed)
Spoke with Joellen Jersey and she stated that CY completed and signed the [employer] and DMV forms. We did not receive a [employer] form and that all forms that were brought to [facility] were handed back to the caregiver at pick up.   LVM for pt to return call.

## 2015-02-04 NOTE — Telephone Encounter (Signed)
Spoke with pt. She is very confused about what is needed. States that we told her we needed a form from [facility]. I do not see where that is documented in her chart. Pt wants to speak to [facility]. Advised her she is out sick today. She verbalized understanding.

## 2015-02-17 ENCOUNTER — Telehealth: Payer: Self-pay | Admitting: Cardiovascular Disease

## 2015-02-17 NOTE — Telephone Encounter (Signed)
Agree, sorry she felt poorly, but would not change meds based on that information about her BP and HR.

## 2015-02-17 NOTE — Telephone Encounter (Signed)
Pt says it went up and down last night,could not sleep. This morning chest is just aching,no pain,rook her Diltiazem. She does feel a little better now. Please call to advise.

## 2015-02-17 NOTE — Telephone Encounter (Signed)
PATIENT STATES SHE UNABLE ANSWER QUESTION AT PRESENT  RN WILL CALL BACK A LITTLE LATER

## 2015-02-17 NOTE — Telephone Encounter (Signed)
Spoke to patient Patient states last night--  Blood pressure can going up and down.   She states her chest aching,not hurting. She felt like it was irregular.  Blood pressure  Range 160 -170/72. Pulse rate 75-99. She states she kept waking up last night was able to sleep well. Patient has been taking diltiazem 120 mg As prescribed twice a day She states she doing okay at present.  RN   Reassured patient.-that blood pressure and pulse was not in the dangerous range and Informed patient will defer to Dr Sallyanne Kuster and contact her back

## 2015-02-18 NOTE — Telephone Encounter (Signed)
Informed patient,she states she slept better last night

## 2015-03-08 ENCOUNTER — Ambulatory Visit (INDEPENDENT_AMBULATORY_CARE_PROVIDER_SITE_OTHER): Payer: Medicare Other | Admitting: Cardiovascular Disease

## 2015-03-08 VITALS — BP 136/60 | HR 51 | Ht 66.0 in | Wt 243.0 lb

## 2015-03-08 DIAGNOSIS — R079 Chest pain, unspecified: Secondary | ICD-10-CM | POA: Diagnosis not present

## 2015-03-08 DIAGNOSIS — R0602 Shortness of breath: Secondary | ICD-10-CM | POA: Diagnosis not present

## 2015-03-08 DIAGNOSIS — I10 Essential (primary) hypertension: Secondary | ICD-10-CM

## 2015-03-08 DIAGNOSIS — Z5181 Encounter for therapeutic drug level monitoring: Secondary | ICD-10-CM

## 2015-03-08 DIAGNOSIS — I480 Paroxysmal atrial fibrillation: Secondary | ICD-10-CM

## 2015-03-08 DIAGNOSIS — I272 Other secondary pulmonary hypertension: Secondary | ICD-10-CM | POA: Diagnosis not present

## 2015-03-08 DIAGNOSIS — Z7901 Long term (current) use of anticoagulants: Secondary | ICD-10-CM

## 2015-03-08 DIAGNOSIS — G4733 Obstructive sleep apnea (adult) (pediatric): Secondary | ICD-10-CM

## 2015-03-08 DIAGNOSIS — J9611 Chronic respiratory failure with hypoxia: Secondary | ICD-10-CM

## 2015-03-08 NOTE — Patient Instructions (Signed)
Dr. Sallyanne Kuster recommends that you schedule a follow-up appointment in: 6 MONTHS

## 2015-03-08 NOTE — Progress Notes (Signed)
Patient ID: Jacqueline Nguyen, female   DOB: Aug 21, 1931, 81 y.o.   MRN: [id number]      Cardiology Office Note   Date:  03/10/2015   ID:  Jacqueline Nguyen, DOB 1931/09/12, MRN [id number]  PCP:  Sheral Apley  Cardiologist:   Sanda Klein, MD   Chief Complaint  Patient presents with  . Follow-up    pt states in the last few day she had some chest pain but doesn't think its her heart, some SOB depending on what she is doing, some light headedness and dizziness, no edema      History of Present Illness: Jacqueline Nguyen is a 81 y.o. female who presents for right heart failure (secondary to obstructive sleep apnea, previous pulmonary embolism, obesity) and reported paroxysmal atrial fibrillation.   she has generally been feeling well. Her palpitations are very infrequent. She's had some atypical chest discomfort that sounds musculoskeletal. She has mild dyspnea and remains very sedentary. She has very infrequent dizziness and lightheadedness and has not been troubled by lower extremity edema.   she has mild sinus bradycardia today. She reports that she is taking atenolol 25 mg once daily and sometimes only takes a half tablet if she feels that her blood pressure is too low.  In 2009 she had a "massive" bilateral pulmonary embolism and she has long-standing systemic hypertension, morbid obesity, reactive airway disease, obstructive sleep apnea (on CPAP) and osteoporosis and degenerative arthritis. She wears oxygen at night and with activity and often uses a scooter. In 2014 she had transient right eye blindness, not long after cataract surgery. Has glaucoma. Echo in August 2015 showed normal left ventricular regional wall motion and EF 65%, no significant valvular abnormalities, estimated systolic PA pressure 40 mm Hg. Her event monitor showed frequent PACs and brief bursts of nonsustained atrial tachycardia, but there was no confirmed atrial fibrillation.  Past Medical History  Diagnosis Date  .  Obesities, morbid   . Skin cancer   . Cataract   . OSA (obstructive sleep apnea)   . Osteoporosis   . Arthritis   . Vertigo   . Hx pulmonary embolism     on chronic coumadin  . Endometriosis   . PAF (paroxysmal atrial fibrillation)     Event Monitor (8/15):  Frequent PACs, brief bursts of nonsustained ATach; no AFib  . Psoriasis   . Hx of echocardiogram     Echo (8/15):  Mild LVH, EF 65%, no RWMA, Ao sclerosis, no AS, mod MAC, mild LAE, normal RVSF, PASP 40 mmHg  . HTN (hypertension)   . Chronic respiratory failure     Past Surgical History  Procedure Laterality Date  . Oophorectomy    . Nose surgery    . Skin surgery    . Gallbladder surgery    . Uvulopalatopharyngoplasty    . Cataract surgery       Current Outpatient Prescriptions  Medication Sig Dispense Refill  . albuterol (PROVENTIL HFA;VENTOLIN HFA) 108 (90 BASE) MCG/ACT inhaler Inhale 2 puffs into the lungs every 6 (six) hours as needed for wheezing.    Marland Kitchen amLODipine (NORVASC) 5 MG tablet Take 1 tablet (5 mg total) by mouth daily. 30 tablet 6  . Ascorbic Acid (VITAMIN C PO) Take 600 mg by mouth daily.    Marland Kitchen atenolol (TENORMIN) 25 MG tablet Take 25 mg by mouth 2 (two) times daily.     . brimonidine-timolol (COMBIGAN) 0.2-0.5 % ophthalmic solution Place 1 drop into both eyes every 12 (twelve) hours.     Marland Kitchen  brinzolamide (AZOPT) 1 % ophthalmic suspension Place 1 drop into both eyes 2 (two) times daily.     . budesonide-formoterol (SYMBICORT) 160-4.5 MCG/ACT inhaler Inhale 2 puffs into the lungs at bedtime.     Marland Kitchen diltiazem (CARDIZEM CD) 120 MG 24 hr capsule Take 1 capsule (120 mg total) by mouth 2 (two) times daily. 180 capsule 1  . fluticasone (FLOVENT HFA) 44 MCG/ACT inhaler Take 2 puffs by mouth 2 (two) times daily.    . furosemide (LASIX) 20 MG tablet Take 1 tablet (20 mg total) by mouth daily. 30 tablet 11  . Homeopathic Products (ARNICARE ARNICA) CREA Apply 1 application topically daily.     Marland Kitchen HYDROcodone-acetaminophen  (NORCO) 10-325 MG tablet Take 1 tablet by mouth daily.    Marland Kitchen levalbuterol (XOPENEX) 0.63 MG/3ML nebulizer solution Take 1 ampule by nebulization every 8 (eight) hours as needed for wheezing.    Marland Kitchen LORazepam (ATIVAN) 1 MG tablet TAKE ONE TABLET FOUR TIMES DAILY AS NEEDED    . meclizine (ANTIVERT) 25 MG tablet TAKE ONE TABLET 2-3 TIMES A DAY AS NEEDED FOR VERTIGO    . Multiple Minerals (MINERAL COMPLEX PO) Take 1 tablet by mouth daily.     . NON FORMULARY Place 4 L into the nose at bedtime. 3 liter as needed during day depending on extertion    . Nystatin ([place]) 100000 UNIT/GM POWD Apply topically.    Marland Kitchen OLIVE LEAF EXTRACT PO Take 450 mg by mouth daily.    Marland Kitchen Respiratory Therapy Supplies (FLUTTER) DEVI Blow through 4 times per set, three sets daily 1 each 0  . Turmeric 500 MG CAPS Take 500 mg by mouth daily as needed.    . warfarin (COUMADIN) 2.5 MG tablet Take 2.5 mg by mouth as directed.      No current facility-administered medications for this visit.    Allergies:   Ciprofloxacin; Bactrim; and Codeine    Social History:  The patient  reports that she quit smoking about 33 years ago. Her smoking use included Cigarettes. She has a 2 pack-year smoking history. She has never used smokeless tobacco. She reports that she does not drink alcohol or use illicit drugs.   Family History:  The patient's family history includes Heart attack in her brother and father.    ROS:  Please see the history of present illness.    Otherwise, review of systems positive for none.   All other systems are reviewed and negative.    PHYSICAL EXAM: VS:  BP 136/60 mmHg  Pulse 51  Ht _0  (1.676 m)  Wt 243 lb (110.224 kg)  BMI 39.24 kg/m2 , BMI Body mass index is 39.24 kg/(m^2).  General: Alert, oriented x3, no , orbit obesity limits certain aspects of the physical exam Head: no evidence of trauma, PERRL, EOMI, no exophtalmos or lid lag, no myxedema, no xanthelasma; normal ears, nose and oropharynx Neck:  normal jugular venous pulsations and no hepatojugular reflux; brisk carotid pulses without delay and no carotid bruits Chest: clear to auscultation, no signs of consolidation by percussion or palpation, normal fremitus, symmetrical and full respiratory excursions Cardiovascular:  Unable to define the position and quality of the apical impulse, regular rhythm, normal first and second heart sounds, no murmurs, rubs or gallops Abdomen: no tenderness or distention, no masses by palpation, no abnormal pulsatility or arterial bruits, normal bowel sounds, no hepatosplenomegaly Extremities: no clubbing, cyanosis or edema; 2+ radial, ulnar and brachial pulses bilaterally; 2+ right femoral, posterior tibial and dorsalis  pedis pulses; 2+ left femoral, posterior tibial and dorsalis pedis pulses; no subclavian or femoral bruits Neurological: grossly nonfocal Psych: euthymic mood, full affect   EKG:  EKG is ordered today. The ekg ordered today demonstrates  sinus bradycardia 51 bpm, generalized low voltage   Recent Labs: No results found for requested labs within last 365 days.    Lipid Panel    Component Value Date/Time   CHOL 205* 08/30/2012 0550   TRIG 102 08/30/2012 0550   HDL 55 08/30/2012 0550   CHOLHDL 3.7 08/30/2012 0550   VLDL 20 08/30/2012 0550   LDLCALC 130* 08/30/2012 0550      Wt Readings from Last 3 Encounters:  03/08/15 243 lb (110.224 kg)  12/28/14 269 lb 6.4 oz (122.199 kg)  09/14/14 271 lb 6.4 oz (123.106 kg)     ASSESSMENT AND PLAN:  1.  Paroxysmal atrial tachycardia and PACs, well controlled on a combination of low-dose diltiazem and low-dose atenolol. Described the consequences of beta blocker rebound if she should interrupt this medication abruptly , but I don't think it's a problem if she only takes half of the usual atenolol dose. She reportedly has had atrial fibrillation in the past, although I find no confirmation of this and the documents. Since she has to take  anticoagulation for venous thromboembolic disease anyway, it makes only a small difference  2.  Chronic pulmonary hypertension/chronic respiratory insufficiency related to obesity , obstructive sleep apnea , previous embolism, COPD.  3.  Chronic right heart failure due to cor pulmonale  4. HTN on amlodipine. (the combination of diltiazem and amlodipine appears counterintuitive, but other blood pressure medications have not been well tolerated in the dose of diltiazem cannot be increased due to bradycardia).  Current medicines are reviewed at length with the patient today.  The patient does not have concerns regarding medicines.  The following changes have been made:  no change  Labs/ tests ordered today include:  Orders Placed This Encounter  Procedures  . EKG 12-Lead     Patient Instructions  Dr. Sallyanne Kuster recommends that you schedule a follow-up appointment in: 6 MONTHS       Signed, Nya Monds, MD  03/10/2015 1:04 PM    Sanda Klein, MD, [facility] [phone] office [phone] pager

## 2015-03-10 ENCOUNTER — Encounter: Payer: Self-pay | Admitting: Cardiovascular Disease

## 2015-03-10 ENCOUNTER — Telehealth: Payer: Self-pay | Admitting: Internal Medicine

## 2015-03-11 NOTE — Telephone Encounter (Signed)
Katie please advise if this form has been completed. Thanks.

## 2015-03-12 ENCOUNTER — Telehealth: Payer: Self-pay | Admitting: Cardiovascular Disease

## 2015-03-12 NOTE — Telephone Encounter (Signed)
Katie, please advise on forms. Thanks

## 2015-03-12 NOTE — Telephone Encounter (Signed)
Returned call to patient no answer.[place].

## 2015-03-12 NOTE — Telephone Encounter (Signed)
Pt called in stating that since her appt on 12/5 she has experienced 2 episodes for having lightheadedness, SOB , and some shakiness. She has been diagnosed with heart failure and she isn't sure if this is a symptom.Please f/u with her  Thanks

## 2015-03-12 NOTE — Telephone Encounter (Signed)
Forms were completed a month or longer ago; pt was informed that the only portion that needs to be completed is her information and then she sends to [employer]. Pt will send her caregiver to pick up forms at front. Nothing more needed at this time

## 2015-03-25 ENCOUNTER — Telehealth: Payer: Self-pay | Admitting: Cardiovascular Disease

## 2015-03-25 NOTE — Telephone Encounter (Signed)
Pt said she had another episode last night,it is hard for her to explain. She called in with these same feeling on 02-10-15. Please call to advise.

## 2015-03-25 NOTE — Telephone Encounter (Signed)
Pt called in 03-12-15 instead of 02-10-15.

## 2015-03-25 NOTE — Telephone Encounter (Signed)
Spoke with pt, she has had 4 episodes in the last month. It is hard for her to explain the episodes but she states it will start with her heart racing. Then she will get shaky, lightheaded and feel weak, sick on her stomach and faint. She will then feel like she has to urinate several times in a row. This will occur while she is relaxing or working on the computer, never with exertion. She denies cp or SOB.  The whole episode will last about one hour and then she will start feeling better. EMS has said everything was fine when they have out to check her. appt made for pt to see rhonda barrett pa next week. She will call with further problems.

## 2015-03-31 ENCOUNTER — Encounter: Payer: Self-pay | Admitting: Physician Assistant

## 2015-03-31 ENCOUNTER — Ambulatory Visit (INDEPENDENT_AMBULATORY_CARE_PROVIDER_SITE_OTHER): Payer: Medicare Other | Admitting: Physician Assistant

## 2015-03-31 ENCOUNTER — Ambulatory Visit (INDEPENDENT_AMBULATORY_CARE_PROVIDER_SITE_OTHER): Payer: Medicare Other

## 2015-03-31 VITALS — BP 134/62 | HR 55 | Ht 66.0 in | Wt 246.5 lb

## 2015-03-31 DIAGNOSIS — R5383 Other fatigue: Secondary | ICD-10-CM

## 2015-03-31 DIAGNOSIS — Z79899 Other long term (current) drug therapy: Secondary | ICD-10-CM

## 2015-03-31 DIAGNOSIS — R002 Palpitations: Secondary | ICD-10-CM

## 2015-03-31 LAB — BASIC METABOLIC PANEL
BUN: 9 mg/dL (ref 7–25)
CALCIUM: 9.2 mg/dL (ref 8.6–10.4)
CO2: 29 mmol/L (ref 20–31)
Chloride: 102 mmol/L (ref 98–110)
Creat: 0.54 mg/dL — ABNORMAL LOW (ref 0.60–0.88)
GLUCOSE: 88 mg/dL (ref 65–99)
Potassium: 4.4 mmol/L (ref 3.5–5.3)
Sodium: 143 mmol/L (ref 135–146)

## 2015-03-31 LAB — TSH: TSH: 0.691 u[IU]/mL (ref 0.350–4.500)

## 2015-03-31 LAB — CBC
HCT: 41.4 % (ref 36.0–46.0)
HEMOGLOBIN: 13.6 g/dL (ref 12.0–15.0)
MCH: 30.3 pg (ref 26.0–34.0)
MCHC: 32.9 g/dL (ref 30.0–36.0)
MCV: 92.2 fL (ref 78.0–100.0)
MPV: 9.9 fL (ref 8.6–12.4)
Platelets: 399 10*3/uL (ref 150–400)
RBC: 4.49 MIL/uL (ref 3.87–5.11)
RDW: 13.9 % (ref 11.5–15.5)
WBC: 7.8 10*3/uL (ref 4.0–10.5)

## 2015-03-31 NOTE — Patient Instructions (Signed)
Your physician recommends that you schedule a follow-up appointment in: 1 Month with Dr Sallyanne Kuster or Rosaria Ferries  Your physician recommends that you return for lab work in: Today: CBC, TSH BMP  Your physician has recommended you make the following change in your medication: STOP Amlodipine and Atenolol  Your physician has recommended that you wear an event monitor for 2 weeks. Event monitors are medical devices that record the heart's electrical activity. Doctors most often [place] these monitors to diagnose arrhythmias. Arrhythmias are problems with the speed or rhythm of the heartbeat. The monitor is a small, portable device. You can wear one while you do your normal daily activities. This is usually used to diagnose what is causing palpitations/syncope (passing out).               Happy New Year!!

## 2015-03-31 NOTE — Progress Notes (Signed)
Cardiology Office Note   Date:  03/31/2015   ID:  Jacqueline Nguyen, DOB Aug 09, 1931, MRN [id number]  PCP:  Sheral Apley  Cardiologist:  Dr Juanetta Snow, PA-C   Chief Complaint  Patient presents with  . Follow-up    pt c/o feeling weak and trembling, lasts for an hour at a time//chest tightness, lasts a few minutes, has had 4 episodes since she was here last (DEC 5 with Dr. Sallyanne Kuster)   . Shortness of Breath    on minimal exertion  . Dizziness    happens randomly  . Edema    in upper abdomen    History of Present Illness: Jacqueline Nguyen is a 81 y.o. female with a history of R heart failure (2nd obesity, OSA, PE), atrial tach but no afib on event monitor, HTN, chronic resp failure.   Recent episodes of palpitations, not captured on ECG   Jacqueline Nguyen presents for evaluation of palpitations.  Jacqueline Nguyen has been getting more frequent palpitations. They are waking her up almost every night. She also wakes up with him in the morning. She has not been able to check her blood pressure while she was having them so does not know how fast her heart rate is getting. They do not make her lightheaded or dizzy and she has not been getting chest pain. However, she is very symptomatic with them and they are happening more and more often.  She is compliant with her diltiazem and tries to take it every 12 hours. She is also compliant with her Coumadin, which is followed on a home machine but by a Coumadin service.  Recently she has noticed her baseline heart rate to be in the low 50s. Because of this she rarely takes her atenolol. The last time she had any, she had a fourth of a tablet yesterday. Before then, she is not sure when she took it last.  She denies dyspnea on exertion and feels her respiratory status is at baseline. Her legs have been a little sore but not swollen.   Past Medical History  Diagnosis Date  . Obesities, morbid ([place])   . Skin cancer   . Cataract   . OSA  (obstructive sleep apnea)   . Osteoporosis   . Arthritis   . Vertigo   . Hx pulmonary embolism     on chronic coumadin  . Endometriosis   . PAF (paroxysmal atrial fibrillation) ([place])     Event Monitor (8/15):  Frequent PACs, brief bursts of nonsustained ATach; no AFib  . Psoriasis   . Hx of echocardiogram     Echo (8/15):  Mild LVH, EF 65%, no RWMA, Ao sclerosis, no AS, mod MAC, mild LAE, normal RVSF, PASP 40 mmHg  . HTN (hypertension)   . Chronic respiratory failure [facility])     Past Surgical History  Procedure Laterality Date  . Oophorectomy    . Nose surgery    . Skin surgery    . Gallbladder surgery    . Uvulopalatopharyngoplasty    . Cataract surgery      Current Outpatient Prescriptions  Medication Sig Dispense Refill  . albuterol (PROVENTIL HFA;VENTOLIN HFA) 108 (90 BASE) MCG/ACT inhaler Inhale 2 puffs into the lungs every 6 (six) hours as needed for wheezing.    Marland Kitchen amLODipine (NORVASC) 5 MG tablet Take 1 tablet (5 mg total) by mouth daily. 30 tablet 6  . Ascorbic Acid (VITAMIN C PO) Take 600 mg by mouth daily.    Marland Kitchen  atenolol (TENORMIN) 25 MG tablet Take 25 mg by mouth 2 (two) times daily.     . brimonidine-timolol (COMBIGAN) 0.2-0.5 % ophthalmic solution Place 1 drop into both eyes every 12 (twelve) hours.     . brinzolamide (AZOPT) 1 % ophthalmic suspension Place 1 drop into both eyes 2 (two) times daily.     . budesonide-formoterol (SYMBICORT) 160-4.5 MCG/ACT inhaler Inhale 2 puffs into the lungs at bedtime.     Marland Kitchen diltiazem (CARDIZEM CD) 120 MG 24 hr capsule Take 1 capsule (120 mg total) by mouth 2 (two) times daily. 180 capsule 1  . fluticasone (FLOVENT HFA) 44 MCG/ACT inhaler Take 2 puffs by mouth 2 (two) times daily.    . furosemide (LASIX) 20 MG tablet Take 1 tablet (20 mg total) by mouth daily. 30 tablet 11  . Homeopathic Products (ARNICARE ARNICA) CREA Apply 1 application topically daily.     Marland Kitchen HYDROcodone-acetaminophen (NORCO) 10-325 MG tablet Take 1 tablet by mouth  daily.    Marland Kitchen levalbuterol (XOPENEX) 0.63 MG/3ML nebulizer solution Take 1 ampule by nebulization every 8 (eight) hours as needed for wheezing.    Marland Kitchen LORazepam (ATIVAN) 1 MG tablet TAKE ONE TABLET FOUR TIMES DAILY AS NEEDED    . meclizine (ANTIVERT) 25 MG tablet TAKE ONE TABLET 2-3 TIMES A DAY AS NEEDED FOR VERTIGO    . Multiple Minerals (MINERAL COMPLEX PO) Take 1 tablet by mouth daily.     . NON FORMULARY Place 4 L into the nose at bedtime. 3 liter as needed during day depending on extertion    . Nystatin ([place]) 100000 UNIT/GM POWD Apply topically.    Marland Kitchen OLIVE LEAF EXTRACT PO Take 450 mg by mouth daily.    Marland Kitchen Respiratory Therapy Supplies (FLUTTER) DEVI Blow through 4 times per set, three sets daily 1 each 0  . Turmeric 500 MG CAPS Take 500 mg by mouth daily as needed.    . warfarin (COUMADIN) 2.5 MG tablet Take 2.5 mg by mouth as directed.      No current facility-administered medications for this visit.    Allergies:   Ciprofloxacin; Bactrim; and Codeine    Social History:  The patient  reports that she quit smoking about 34 years ago. Her smoking use included Cigarettes. She has a 2 pack-year smoking history. She has never used smokeless tobacco. She reports that she does not drink alcohol or use illicit drugs.   Family History:  The patient's family history includes Heart attack in her brother and father.    ROS:  Please see the history of present illness. All other systems are reviewed and negative.    PHYSICAL EXAM: VS:  BP 134/62 mmHg  Pulse 55  Ht _0  (1.676 m)  Wt 246 lb 8 oz (111.812 kg)  BMI 39.81 kg/m2 , BMI Body mass index is 39.81 kg/(m^2). GEN: Well nourished, well developed, morbidly obese, elderly female in no acute distress HEENT: normal for age  Neck: no JVD, no carotid bruit, no masses Cardiac: RRR; 2/6 murmur, no rubs, or gallops Respiratory: Rales bases bilaterally, clears with deep inspiration, normal work of breathing GI: soft, nontender, nondistended, +  BS MS: no deformity or atrophy; no edema; distal pulses are 2+ in all 4 extremities  Skin: warm and dry, no rash Neuro:  Strength and sensation are intact Psych: euthymic mood, full affect   EKG:  EKG is ordered today. The ekg ordered today demonstrates sinus rhythm, no acute ischemic changes and normal intervals EMS ECG  also shows sinus rhythm with no acute changes  Recent Labs: No results found for requested labs within last 365 days.    Lipid Panel    Component Value Date/Time   CHOL 205* 08/30/2012 0550   TRIG 102 08/30/2012 0550   HDL 55 08/30/2012 0550   CHOLHDL 3.7 08/30/2012 0550   VLDL 20 08/30/2012 0550   LDLCALC 130* 08/30/2012 0550     Wt Readings from Last 3 Encounters:  03/31/15 246 lb 8 oz (111.812 kg)  03/08/15 243 lb (110.224 kg)  12/28/14 269 lb 6.4 oz (122.199 kg)     Other studies Reviewed: Additional studies/ records that were reviewed today include: Office Notes, ER notes and other records.  ASSESSMENT AND PLAN:  1.  Palpitations: She is having them more frequently. We cannot increase her Cardizem because of her baseline bradycardia. In fact, we need to stop the atenolol because of the bradycardia. We will get an event monitor to document their frequency and intensity and to make sure she is just having atrial tachycardia and not atrial fibrillation. We will also check a TSH, CBC and a BMET as she has had no recent labs.  Unfortunately, her low heart rate on the Cardizem is limiting medication options. She may be having significant bradycardia as well as tachycardia.  As she is on the Cardizem and needs this, we will discontinue the amlodipine. She notes that she has not been taking it very much because she feels it will drop her blood pressure too low.  Follow-up after the event monitor to determine a plan of care once more information is available.   Current medicines are reviewed at length with the patient today.  The patient does not have  concerns regarding medicines.  The following changes have been made:  DC amlodipine and atenolol  Labs/ tests ordered today include:   Orders Placed This Encounter  Procedures  . CBC  . TSH  . Basic Metabolic Panel (BMET)  . Cardiac event monitor  . EKG 12-Lead     Disposition:   FU with myself or Dr. Sallyanne Kuster after the event monitor.  Augusto Garbe  03/31/2015 11:19 AM    [facility] [place], [place], [place]  [address] Phone: [phone]; Fax: [phone]

## 2015-04-01 ENCOUNTER — Telehealth: Payer: Self-pay | Admitting: Physician Assistant

## 2015-04-01 NOTE — Telephone Encounter (Signed)
Pt called in stating that she had a heart monitor placed yesterday by [place] and she says today she is having some problems with changing the adhesive strips. Please f/u with pt  Thanks

## 2015-04-01 NOTE — Telephone Encounter (Signed)
Patient called and device is indicating that black lead is not working.  Has placed a new pad and made sure wire was plugged in correctly.  Given [employer]'s number to call ([phone])

## 2015-04-07 ENCOUNTER — Encounter: Payer: Self-pay | Admitting: Physician Assistant

## 2015-04-12 ENCOUNTER — Telehealth: Payer: Self-pay | Admitting: Cardiovascular Disease

## 2015-04-12 NOTE — Telephone Encounter (Signed)
Spoke to Jacqueline Nguyen. She had ordered electrodes from [employer] which had not yet arrived - placed order Friday.  Advised to call them to notify - likely a delay of shipment d/t weather, but advised to notify [employer] so that they can extend her monitoring service duration by appropriate number of days.  Pt voiced understanding and thanks.

## 2015-04-12 NOTE — Telephone Encounter (Signed)
Jacqueline Nguyen is calling because she is wearing a heat monitor and she is out of leads and is asking what should she do . Please call   Thanks

## 2015-04-26 ENCOUNTER — Ambulatory Visit: Payer: Medicare Other | Admitting: Physician Assistant

## 2015-05-05 ENCOUNTER — Encounter: Payer: Self-pay | Admitting: Physician Assistant

## 2015-05-05 ENCOUNTER — Ambulatory Visit (INDEPENDENT_AMBULATORY_CARE_PROVIDER_SITE_OTHER): Payer: Medicare Other | Admitting: Physician Assistant

## 2015-05-05 VITALS — BP 130/70 | HR 56 | Ht 66.0 in | Wt 242.0 lb

## 2015-05-05 DIAGNOSIS — I10 Essential (primary) hypertension: Secondary | ICD-10-CM | POA: Diagnosis not present

## 2015-05-05 DIAGNOSIS — R002 Palpitations: Secondary | ICD-10-CM | POA: Diagnosis not present

## 2015-05-05 LAB — BASIC METABOLIC PANEL
BUN: 9 mg/dL (ref 7–25)
CALCIUM: 9.4 mg/dL (ref 8.6–10.4)
CO2: 32 mmol/L — AB (ref 20–31)
CREATININE: 0.6 mg/dL (ref 0.60–0.88)
Chloride: 101 mmol/L (ref 98–110)
GLUCOSE: 98 mg/dL (ref 65–99)
Potassium: 4 mmol/L (ref 3.5–5.3)
Sodium: 139 mmol/L (ref 135–146)

## 2015-05-05 MED ORDER — HYDRALAZINE HCL 10 MG PO TABS: 10.0000 mg | ORAL_TABLET | Freq: Three times a day (TID) | ORAL | Status: DC | PRN

## 2015-05-05 NOTE — Progress Notes (Signed)
Cardiology Office Note   Date:  05/05/2015   ID:  Jacqueline Nguyen, DOB [birth year], MRN [id number]  PCP:  Sheral Apley  Cardiologist:  Dr Juanetta Snow, PA-C   Chief complaint: Palpitations  History of Present Illness: Jacqueline Nguyen is a 82 y.o. female with a history of R heart failure (2nd obesity, OSA, PE), in 2015 had atrial tach but no afib on event monitor, HTN, chronic resp failure.   Seen 12/28 for palpitations and event monitor placed.  Jacqueline Nguyen presents for follow-up of her event monitor and further evaluation.  Jacqueline Nguyen had multiple episodes of palpitations and a great deal of data was obtained while she was wearing the monitor. These data were all reviewed with her. She had one episode where she was asymptomatic and had a 2.6 second pause, she also had occasional PACs. She had no critical arrhythmia, and had no atrial tachycardia which had been seen before.   The only medication she takes which can lower her heart rate is Cardizem. She is taking Cardizem CD 120 mg twice a day. Her heart rate was 49 on the EKG and she says her home blood pressure medication she has recorded heart rates in the 40s at times as well. This was discussed with her in detail, but she feels better on the Cardizem and does not wish to stop it. After further conversation, it was discovered that when her blood pressure is elevated, she takes atenolol when necessary. She took one last night. She states she takes it when her systolic blood pressure is elevated, 160 or better.    She has not been having problems with volume recently. She wears CPAP at night and uses oxygen at times. She does not feel her weight has changed much and her lower extremity edema is at baseline. She has tenderness in both legs which is chronic and has not changed recently.    Past Medical History  Diagnosis Date  . Obesities, morbid ([place])   . Skin cancer   . Cataract   . OSA (obstructive sleep apnea)   .  Osteoporosis   . Arthritis   . Vertigo   . Hx pulmonary embolism     on chronic coumadin  . Endometriosis   . PAF (paroxysmal atrial fibrillation) ([place])     Event Monitor (8/15):  Frequent PACs, brief bursts of nonsustained ATach; no AFib  . Psoriasis   . Hx of echocardiogram     Echo (8/15):  Mild LVH, EF 65%, no RWMA, Ao sclerosis, no AS, mod MAC, mild LAE, normal RVSF, PASP 40 mmHg  . HTN (hypertension)   . Chronic respiratory failure [facility])     Past Surgical History  Procedure Laterality Date  . Oophorectomy    . Nose surgery    . Skin surgery    . Gallbladder surgery    . Uvulopalatopharyngoplasty    . Cataract surgery      Current Outpatient Prescriptions  Medication Sig Dispense Refill  . albuterol (PROVENTIL HFA;VENTOLIN HFA) 108 (90 BASE) MCG/ACT inhaler Inhale 2 puffs into the lungs every 6 (six) hours as needed for wheezing.    . Ascorbic Acid (VITAMIN C PO) Take 600 mg by mouth daily.    Marland Kitchen atenolol (TENORMIN) 25 MG tablet Take 12.5 mg by mouth continuous as needed.     . budesonide-formoterol (SYMBICORT) 160-4.5 MCG/ACT inhaler Inhale 2 puffs into the lungs at bedtime.     Marland Kitchen diltiazem (CARDIZEM CD) 120 MG 24  hr capsule Take 1 capsule (120 mg total) by mouth 2 (two) times daily. 180 capsule 1  . fluticasone (FLOVENT HFA) 44 MCG/ACT inhaler Take 2 puffs by mouth 2 (two) times daily.    . furosemide (LASIX) 20 MG tablet Take 1 tablet (20 mg total) by mouth daily. 30 tablet 11  . Homeopathic Products (ARNICARE ARNICA) CREA Apply 1 application topically daily.     Marland Kitchen HYDROcodone-acetaminophen (NORCO) 10-325 MG tablet Take 1 tablet by mouth daily.    Marland Kitchen levalbuterol (XOPENEX) 0.63 MG/3ML nebulizer solution Take 1 ampule by nebulization every 8 (eight) hours as needed for wheezing.    Marland Kitchen LORazepam (ATIVAN) 1 MG tablet TAKE ONE TABLET FOUR TIMES DAILY AS NEEDED    . meclizine (ANTIVERT) 25 MG tablet TAKE ONE TABLET 2-3 TIMES A DAY AS NEEDED FOR VERTIGO    . Multiple Minerals  (MINERAL COMPLEX PO) Take 1 tablet by mouth daily.     . NON FORMULARY Place 4 L into the nose at bedtime. 3 liter as needed during day depending on extertion    . Nystatin ([place]ille) 100000 UNIT/GM POWD Apply topically.    Marland Kitchen OLIVE LEAF EXTRACT PO Take 450 mg by mouth daily.    Marland Kitchen Respiratory Therapy Supplies (FLUTTER) DEVI Blow through 4 times per set, three sets daily 1 each 0  . Turmeric 500 MG CAPS Take 500 mg by mouth daily as needed.    . warfarin (COUMADIN) 2.5 MG tablet Take 2.5 mg by mouth as directed.     . hydrALAZINE (APRESOLINE) 10 MG tablet Take 1 tablet (10 mg total) by mouth 3 (three) times daily as needed (for systolic blood pressure greater than 160). 90 tablet 11   No current facility-administered medications for this visit.    Allergies:   Ciprofloxacin; Bactrim; and Codeine    Social History:  The patient  reports that she quit smoking about 34 years ago. Her smoking use included Cigarettes. She has a 2 pack-year smoking history. She has never used smokeless tobacco. She reports that she does not drink alcohol or use illicit drugs.   Family History:  The patient's family history includes Heart attack in her brother and father. There is no history of Stroke.    ROS:  Please see the history of present illness. All other systems are reviewed and negative.    PHYSICAL EXAM: VS:  BP 130/70 mmHg  Pulse 56  Ht _0  (1.676 m)  Wt 242 lb (109.77 kg)  BMI 39.08 kg/m2 , BMI Body mass index is 39.08 kg/(m^2). GEN: Well nourished, well developed, female in no acute distress HEENT: normal for age  Neck: no JVD, no carotid bruit, no masses Cardiac: RRR, 2/6  murmur, no rubs, or gallops Respiratory:  decreased breath sounds bases  bilaterally, normal work of breathing GI: soft, nontender, nondistended, + BS MS: no deformity or atrophy; no edema; distal pulses are 2+ in all 4 extremities; both legs are tender to palpation  Skin: warm and dry, no rash Neuro:  Strength and  sensation are intact Psych: euthymic mood, full affect   EKG:  EKG is ordered today. The ekg ordered today demonstrates  sinus bradycardia, rate 49 (previous heart rate 51), early repolarization changes in the inferior leads are similar to ECG dated 03/08/2015.    Recent Labs: 03/31/2015: BUN 9; Creat 0.54*; Hemoglobin 13.6; Platelets 399; Potassium 4.4; Sodium 143; TSH 0.691    Lipid Panel    Component Value Date/Time   CHOL 205*  08/30/2012 0550   TRIG 102 08/30/2012 0550   HDL 55 08/30/2012 0550   CHOLHDL 3.7 08/30/2012 0550   VLDL 20 08/30/2012 0550   LDLCALC 130* 08/30/2012 0550     Wt Readings from Last 3 Encounters:  05/05/15 242 lb (109.77 kg)  03/31/15 246 lb 8 oz (111.812 kg)  03/08/15 243 lb (110.224 kg)     Other studies Reviewed: Additional studies/ records that were reviewed today include:previous office notes and other recordsASSESSMENT AND PLAN:  1.  palpitations: I reviewed the transcription of her event monitor in detail. There were multiple episodes where she felt lightheaded dizzy or palpitations. They were not all associated with PACs but some more. She was concerned about this, but I advised her that her heart rhythm was not causing her symptoms. She is encouraged to follow-up with her primary care physician for these. She is reassured that her heart is not causing her problems and she is also encouraged to relax her self and take some deep breaths to see the symptoms will resolve.    2. Sinus bradycardia: She is in sinus bradycardia in the office today and says she had seen some at other times as well. No heart rates in the low 50s were documented on her monitor. One pause of 2.6 seconds was created by a PAC and she was asymptomatic at that time. It is possible that she is only bradycardic after she has taken the atenolol which has been discontinued. She is encouraged to throw away the atenolol and not take it.  3. Hypertension: I advised her that I would be  happy to prescribe something to take when necessary when her systolic blood pressures greater than 160. We will continue the carvedilol at current dose, and add hydralazine when necessary.   Current medicines are reviewed at length with the patient today.  The patient does not have concerns regarding medicines.  The following changes have been made:  Discontinue the atenolol (again), and when necessary hydralazine   Labs/ tests ordered today include:   Orders Placed This Encounter  Procedures  . Basic Metabolic Panel (BMET)  . EKG 12-Lead     Disposition:   F/u Dr. Sallyanne Kuster  Signed, Lenoard Aden  05/05/2015 4:36 PM    [facility] [address], [place], [place]  [address] Phone: [phone]; Fax: [phone]

## 2015-05-05 NOTE — Patient Instructions (Signed)
Medication Instructions:  1) START Hydralazine 24m three times daily as needed for systolic blood pressure greater than 160.  Labwork: BMET today  Testing/Procedures: None  Follow-Up: Your physician recommends that you schedule a follow-up appointment in: 3 months with Dr. CSallyanne Kuster   Any Other Special Instructions Will Be Listed Below (If Applicable).     If you need a refill on your cardiac medications before your next appointment, please call your pharmacy.

## 2015-05-06 ENCOUNTER — Telehealth: Payer: Self-pay | Admitting: Cardiovascular Disease

## 2015-05-06 NOTE — Telephone Encounter (Signed)
New message      Pt c/o BP issue: STAT if pt c/o blurred vision, one-sided weakness or slurred speech  1. What are your last 5 BP readings? 142/101 at 1:20am, 107/46 at 1:30 2. Are you having any other symptoms (ex. Dizziness, headache, blurred vision, passed out)? no 3. What is your BP issue?  Pt woke up early this morning because she was not feeling good and her bp was high---then it dropped really low this afternoon.  Pt has not gotten her hydralazine filled.

## 2015-05-06 NOTE — Telephone Encounter (Signed)
Jacqueline Nguyen called about 2 BP readings she took after waking from sleep last night. Unsure of why she checked BP then as opposed to scheduled time. Noted as below. Gave encouragement to rely less on individual readings and more on overall trends for indication of what her BP is doing.  Instructed that she is welcome to call if new symptoms.  Pt aware to continue to check BPs daily & simply record these values. However, if she notes a trend of BP increasing or decreasing overall, to let us know. Advised to start hydralazine as directed.  Pt voiced thanks & understanding.

## 2015-05-26 NOTE — Telephone Encounter (Signed)
Forms have not been picked up after being held for over 2 mos.  Originals scanned into chart, can be found in Media tab.  Will shred documents.  Nothing further needed.

## 2015-06-09 ENCOUNTER — Other Ambulatory Visit: Payer: Self-pay | Admitting: Cardiovascular Disease

## 2015-06-09 ENCOUNTER — Telehealth: Payer: Self-pay | Admitting: Cardiovascular Disease

## 2015-06-09 MED ORDER — DILTIAZEM HCL ER COATED BEADS 120 MG PO CP24: 120.0000 mg | ORAL_CAPSULE | Freq: Two times a day (BID) | ORAL | Status: DC

## 2015-06-09 NOTE — Telephone Encounter (Signed)
New Message  Pt requested to speak w/ RN concerning her Rx of hydralazine- 10 mg. Please call back and discuss.

## 2015-06-09 NOTE — Telephone Encounter (Signed)
Questions regarding PRN hydralazine addressed, based on provider recommendations. Pt voiced understanding. She is aware to call for new concerns.

## 2015-06-22 ENCOUNTER — Telehealth: Payer: Self-pay | Admitting: Cardiovascular Disease

## 2015-06-22 NOTE — Telephone Encounter (Signed)
Pt w noted hx of PAF took her PRN atenolol for an episode of tachycardia which resolved.  Pt not having any acute fatigue or SOB - notes ongoing chronic complaints. Advised no intervention at this time. Affirmed use of PRN atenolol for her episodes. Advised to call if any new/worse SOB, fatigue, etc. Routed to Dr. Loletha Grayer for any additional advice.  Pt raised concern over issue w/ CPAP equipment, possibly having poor fit of mask. She will call her DME supplier.  Her orders are managed by Dr. Janee Morn office & she will contact them for further needs regarding this.

## 2015-06-22 NOTE — Telephone Encounter (Signed)
I think she did the right thing and am glad she is better

## 2015-06-22 NOTE — Telephone Encounter (Signed)
Pt called and said her heart rate this morning when she woke up was 132 and BP was 142/61. She called EMS and  They came out ,they said everything was fine. By the time they got there she had taken her medicine and it had calmed down. Please call to advise.

## 2015-07-05 ENCOUNTER — Telehealth: Payer: Self-pay | Admitting: Cardiovascular Disease

## 2015-07-05 NOTE — Telephone Encounter (Signed)
Understood

## 2015-07-05 NOTE — Telephone Encounter (Signed)
Pt and I had a very thorough discussion of ongoing symptoms. She is c/o vertigo/dizziness which is not new for her.  However, she notes she was recently eval'd by neurology who did not feel that her symptoms were neurological in nature. They felt she would benefit from cardiology exam. Discussed her complaints. She notes BPs are up and down, she is intermittently dizzy, had recent fall. Discussed benefits of her PRN meds such as meclizine. Pt has a lot of questions which involve medication management, discussion w/ physician. Ultimately we decided it would be best to move her appt from May to a sooner time, if something available. Pt amenable to this. She is scheduled for 4/6 w Dr. Sallyanne Kuster. Aware to call if further needs in interim.

## 2015-07-05 NOTE — Telephone Encounter (Signed)
Pt calling re seeing an ENT because she thought she had vertigo, he thinks she has a circulation problem-BP also went up-wants to know if Dr. Loletha Grayer needs to see her--pt's friend who worked for a Dr for over 10 yrs, thinks she had a stroke --pls advise [phone]

## 2015-07-08 ENCOUNTER — Ambulatory Visit (INDEPENDENT_AMBULATORY_CARE_PROVIDER_SITE_OTHER): Payer: Medicare Other | Admitting: Cardiovascular Disease

## 2015-07-08 ENCOUNTER — Encounter: Payer: Self-pay | Admitting: Cardiovascular Disease

## 2015-07-08 VITALS — BP 130/64 | HR 56 | Ht 64.0 in | Wt 244.4 lb

## 2015-07-08 DIAGNOSIS — I480 Paroxysmal atrial fibrillation: Secondary | ICD-10-CM | POA: Diagnosis not present

## 2015-07-08 DIAGNOSIS — R2681 Unsteadiness on feet: Secondary | ICD-10-CM | POA: Diagnosis not present

## 2015-07-08 DIAGNOSIS — I10 Essential (primary) hypertension: Secondary | ICD-10-CM

## 2015-07-08 DIAGNOSIS — R5381 Other malaise: Secondary | ICD-10-CM

## 2015-07-08 NOTE — Patient Instructions (Signed)
You have been referred to home health for physicial therapy..  I will call you to schedule follow-up appointment.

## 2015-07-08 NOTE — Progress Notes (Signed)
Patient ID: Jacqueline Nguyen, female   DOB: [birth year], 82 y.o.   MRN: [id number] Patient ID: Jacqueline Nguyen, female   DOB: 03/27/1932, 82 y.o.   MRN: [id number]      Cardiology Office Note   Date:  07/09/2015   ID:  Jacqueline Nguyen, DOB [birth year], MRN [id number]  PCP:  Sheral Apley  Cardiologist:   Sanda Klein, MD   Chief Complaint  Patient presents with  . Follow-up    has had some chest pain, has had some shortness of breath, no pain or cramping in legs, occassional dizziness and lightheadedness and vertigo, has edema in legs,   . Fall    had a recent fall      History of Present Illness: Jacqueline Nguyen is a 82 y.o. female who presents for right heart failure (secondary to obstructive sleep apnea, previous pulmonary embolism, obesity) and reported paroxysmal atrial fibrillation.  Present today complaining of a fall, possibly syncope at home.  She was trying to use the portable commode. She suddenly felt herself falling backwards. She does not recall hitting the ground but woke up sitting up with a painful tailbone. She is worried by the fact that she does not actually recall the impact. However she never fell completely to the floor. She called her sister to help but required the assistance of EMS to get up off the floor. She remains extremely unsteady on her feet and requires a lot of assistance to move around. She uses a walker.  She has recently had carotid ultrasonography show no significant obstruction. (Performed in [facility] on July 07, 2015). He also recently wore an event monitor. She had one asymptomatic 2.6 second pause due to a blocked PAC. She submitted a very large number of symptom-activation episodes, all of which showed normal rhythm.  We checked for orthostatic hypotension today. When she tried to stand up after roughly 5 seconds she immediately started feeling extremely unsteady saying that she was about to fall backwards. Her legs were wobbly. We'll able to support her  and check her blood pressure. Her blood pressure was completely unchanged from baseline. Despite this she felt extremely unsteady and insecure.  In 2009 she had a "massive" bilateral pulmonary embolism and she has long-standing systemic hypertension, morbid obesity, reactive airway disease, obstructive sleep apnea (on CPAP) and osteoporosis and degenerative arthritis. She wears oxygen at night and with activity and often uses a scooter. In 2014 she had transient right eye blindness, not long after cataract surgery. Has glaucoma. Echo in August 2015 showed normal left ventricular regional wall motion and EF 65%, no significant valvular abnormalities, estimated systolic PA pressure 40 mm Hg. Her event monitor showed frequent PACs and brief bursts of nonsustained atrial tachycardia, but there was no confirmed atrial fibrillation.  Past Medical History  Diagnosis Date  . Obesities, morbid ([place])   . Skin cancer   . Cataract   . OSA (obstructive sleep apnea)   . Osteoporosis   . Arthritis   . Vertigo   . Hx pulmonary embolism     on chronic coumadin  . Endometriosis   . PAF (paroxysmal atrial fibrillation) ([place])     Event Monitor (8/15):  Frequent PACs, brief bursts of nonsustained ATach; no AFib  . Psoriasis   . Hx of echocardiogram     Echo (8/15):  Mild LVH, EF 65%, no RWMA, Ao sclerosis, no AS, mod MAC, mild LAE, normal RVSF, PASP 40 mmHg  . HTN (hypertension)   . Chronic respiratory failure ([place])  Past Surgical History  Procedure Laterality Date  . Oophorectomy    . Nose surgery    . Skin surgery    . Gallbladder surgery    . Uvulopalatopharyngoplasty    . Cataract surgery       Current Outpatient Prescriptions  Medication Sig Dispense Refill  . albuterol (PROVENTIL HFA;VENTOLIN HFA) 108 (90 BASE) MCG/ACT inhaler Inhale 2 puffs into the lungs every 6 (six) hours as needed for wheezing.    . Ascorbic Acid (VITAMIN C PO) Take 600 mg by mouth daily.    Marland Kitchen atenolol (TENORMIN) 25  MG tablet Take 12.5 mg by mouth continuous as needed.     . AZOPT 1 % ophthalmic suspension Place 1 drop into both eyes 2 (two) times daily.    . budesonide-formoterol (SYMBICORT) 160-4.5 MCG/ACT inhaler Inhale 2 puffs into the lungs at bedtime.     . COMBIGAN 0.2-0.5 % ophthalmic solution Place 1 drop into both eyes 2 (two) times daily.    Marland Kitchen diltiazem (CARDIZEM CD) 120 MG 24 hr capsule Take 1 capsule (120 mg total) by mouth 2 (two) times daily. 180 capsule 0  . fluticasone (FLOVENT HFA) 44 MCG/ACT inhaler Take 2 puffs by mouth 2 (two) times daily.    . furosemide (LASIX) 20 MG tablet Take 1 tablet (20 mg total) by mouth daily. 30 tablet 11  . Homeopathic Products (ARNICARE ARNICA) CREA Apply 1 application topically daily.     . hydrALAZINE (APRESOLINE) 10 MG tablet Take 1 tablet (10 mg total) by mouth 3 (three) times daily as needed (for systolic blood pressure greater than 160). 90 tablet 11  . HYDROcodone-acetaminophen (NORCO) 10-325 MG tablet Take 1 tablet by mouth daily.    Marland Kitchen levalbuterol (XOPENEX) 0.63 MG/3ML nebulizer solution Take 1 ampule by nebulization every 8 (eight) hours as needed for wheezing.    Marland Kitchen LORazepam (ATIVAN) 1 MG tablet TAKE ONE TABLET FOUR TIMES DAILY AS NEEDED    . meclizine (ANTIVERT) 25 MG tablet TAKE ONE TABLET 2-3 TIMES A DAY AS NEEDED FOR VERTIGO    . mometasone (ELOCON) 0.1 % cream Use as directed    . Multiple Minerals (MINERAL COMPLEX PO) Take 1 tablet by mouth daily.     . mupirocin ointment (BACTROBAN) 2 % Use as directed    . NON FORMULARY Place 4 L into the nose at bedtime. 3 liter as needed during day depending on extertion    . Nystatin ([place]) 100000 UNIT/GM POWD Apply topically.    Marland Kitchen OLIVE LEAF EXTRACT PO Take 450 mg by mouth daily.    Marland Kitchen Respiratory Therapy Supplies (FLUTTER) DEVI Blow through 4 times per set, three sets daily 1 each 0  . Turmeric 500 MG CAPS Take 500 mg by mouth daily as needed.    . warfarin (COUMADIN) 2.5 MG tablet Take 2.5 mg by  mouth as directed.      No current facility-administered medications for this visit.    Allergies:   Ciprofloxacin; Bactrim; and Codeine    Social History:  The patient  reports that she quit smoking about 34 years ago. Her smoking use included Cigarettes. She has a 2 pack-year smoking history. She has never used smokeless tobacco. She reports that she does not drink alcohol or use illicit drugs.   Family History:  The patient's family history includes Heart attack in her brother and father. There is no history of Stroke.    ROS:  Please see the history of present illness.  Otherwise, review of systems positive for none.   All other systems are reviewed and negative.    PHYSICAL EXAM: VS:  BP 130/64 mmHg  Pulse 56  Ht _0  (1.626 m)  Wt 110.848 kg (244 lb 6 oz)  BMI 41.93 kg/m2 , BMI Body mass index is 41.93 kg/(m^2).  General: Alert, oriented x3, no , orbit obesity limits certain aspects of the physical exam Head: no evidence of trauma, PERRL, EOMI, no exophtalmos or lid lag, no myxedema, no xanthelasma; normal ears, nose and oropharynx Neck: normal jugular venous pulsations and no hepatojugular reflux; brisk carotid pulses without delay and no carotid bruits Chest: clear to auscultation, no signs of consolidation by percussion or palpation, normal fremitus, symmetrical and full respiratory excursions Cardiovascular:  Unable to define the position and quality of the apical impulse, regular rhythm, normal first and second heart sounds, no murmurs, rubs or gallops Abdomen: no tenderness or distention, no masses by palpation, no abnormal pulsatility or arterial bruits, normal bowel sounds, no hepatosplenomegaly Extremities: no clubbing, cyanosis or edema; 2+ radial, ulnar and brachial pulses bilaterally; 2+ right femoral, posterior tibial and dorsalis pedis pulses; 2+ left femoral, posterior tibial and dorsalis pedis pulses; no subclavian or femoral bruits Neurological: grossly  nonfocal Psych: euthymic mood, full affect   EKG:  EKG is ordered today. The ekg ordered today demonstrates  sinus bradycardia 51 bpm, generalized low voltage   Recent Labs: 03/31/2015: Hemoglobin 13.6; Platelets 399; TSH 0.691 05/05/2015: BUN 9; Creat 0.60; Potassium 4.0; Sodium 139    Lipid Panel    Component Value Date/Time   CHOL 205* 08/30/2012 0550   TRIG 102 08/30/2012 0550   HDL 55 08/30/2012 0550   CHOLHDL 3.7 08/30/2012 0550   VLDL 20 08/30/2012 0550   LDLCALC 130* 08/30/2012 0550      Wt Readings from Last 3 Encounters:  07/08/15 110.848 kg (244 lb 6 oz)  05/05/15 109.77 kg (242 lb)  03/31/15 111.812 kg (246 lb 8 oz)     ASSESSMENT AND PLAN:  Judging by today's findings, I don't think she had syncope, either due to arrhythmia or hypotension. I could not document orthostatic hypotension, despite the fact that she felt extremely insecure and dizzy. Her rhythm and remained normal. It seems to me that her big problem is extremely poor balance due to extremely weak leg muscles/deconditioning. Obesity is compounding the problem. I believe she is at high risk for falling again. Falls may have very severe consequences in this elderly woman on warfarin anticoagulation.   in addition to conventional advice for avoiding falls (removing obstacles, standing up slowly making sure she has appropriate support, appropriate use of walker, etc.) I believe she would greatly benefit from strength training and balance training. She really needs physical therapy. She has a daytime caregiver that is willing to learn to help with physical therapy exercises, but she does not have the appropriate training. Will try to secure home health support.   Current medicines are reviewed at length with the patient today.  The patient does not have concerns regarding medicines.  The following changes have been made:  no change  Labs/ tests ordered today include:   Orders Placed This Encounter    Procedures  . Ambulatory referral to Home Health  . EKG 12-Lead     Patient Instructions  You have been referred to home health for physicial therapy..  I will call you to schedule follow-up appointment.      Mikael Spray, MD  07/09/2015  2:07 PM    Sanda Klein, MD, [facility] [phone] office [phone] pager

## 2015-07-13 ENCOUNTER — Telehealth: Payer: Self-pay

## 2015-07-13 NOTE — Telephone Encounter (Signed)
Opened in error.

## 2015-07-22 ENCOUNTER — Telehealth: Payer: Self-pay

## 2015-07-22 NOTE — Addendum Note (Signed)
Addended by: Diana Eves on: 07/22/2015 04:32 PM   Modules accepted: Orders

## 2015-07-30 NOTE — Telephone Encounter (Signed)
Referral information faxed to [facility] for home physical therapy for strength and balance training. See office note.

## 2015-08-02 ENCOUNTER — Telehealth: Payer: Self-pay | Admitting: Cardiovascular Disease

## 2015-08-02 NOTE — Telephone Encounter (Signed)
Called and spoke to Olena Mater PT. He said pt had questions as to who and why r/t physical therapy and she refused. Roselie Awkward said to call back the main office [phone] if pt agrees to PT.  Called pt's home and [name], pt's DPR, answered and had spoke with his mother. The physical therapist had an accent and I believe the patient was nervous about having someone she didn't know coming to her home. Pt and son did agree to the PT. Encouraged son to call main [facility] number [phone] tomorrow to let them know.  Attempted to call [facility] main line, and only on-call service answered. Since call is non-emergent, I was told to call back between 0830 and 1700 tomorrow. I verbalized understanding and will leave in triage pool for f/u tomorrow 08/03/15.

## 2015-08-02 NOTE — Telephone Encounter (Signed)
New message      Calling to let the doctor know that the physical therapist called to schedule plan of care with pt and she refused.

## 2015-08-03 ENCOUNTER — Telehealth: Payer: Self-pay | Admitting: Cardiovascular Disease

## 2015-08-03 NOTE — Telephone Encounter (Signed)
Acknowledged - based on yesterday's call, spoke to Dr. Sallyanne Kuster and Lars Pinks, RN about this.  OK for PT and no further recommendations.

## 2015-08-03 NOTE — Telephone Encounter (Signed)
New message      Calling to let the doctor know pt was scheduled for PT today but resc to 08-09-15.

## 2015-08-05 ENCOUNTER — Telehealth: Payer: Self-pay | Admitting: Cardiovascular Disease

## 2015-08-05 NOTE — Telephone Encounter (Signed)
Returned call to patient.She stated she received a call from [facility] but did not understand.Spoke to [place] home health was told a female physical therapist will be coming to her home on 08/09/15 for strength and balance training.She will call you on Sunday 5/7 to confirm a time.

## 2015-08-05 NOTE — Telephone Encounter (Signed)
Jacqueline Nguyen is calling because she has dbeen trying to ge t home health , but has not successful in getting it set up . Please Call    Thanks

## 2015-08-09 ENCOUNTER — Telehealth: Payer: Self-pay | Admitting: Cardiovascular Disease

## 2015-08-09 ENCOUNTER — Ambulatory Visit: Payer: Medicare Other | Admitting: Cardiovascular Disease

## 2015-08-09 DIAGNOSIS — R2689 Other abnormalities of gait and mobility: Secondary | ICD-10-CM | POA: Diagnosis not present

## 2015-08-09 NOTE — Telephone Encounter (Signed)
Spoke with C. Truitt, MA - OK for verbal order to continue PT. Multiple notes in epic regarding this.   Called [facility] and communicated OK to continue

## 2015-08-09 NOTE — Telephone Encounter (Signed)
She needs a verbal order to continue Physical Therapy. She will do 2 times for 3 weeks and 1 time for 2 weeks.

## 2015-08-17 ENCOUNTER — Telehealth: Payer: Self-pay | Admitting: Cardiovascular Disease

## 2015-08-17 NOTE — Telephone Encounter (Signed)
Thanks.

## 2015-08-17 NOTE — Telephone Encounter (Signed)
Verbal orders given to add Nursing home health to evaluate and treat, as pt has a reopened old wound on her bottom.  Orders verbalized by Dot Lanes, PT from [facility]

## 2015-08-18 ENCOUNTER — Telehealth: Payer: Self-pay | Admitting: Cardiovascular Disease

## 2015-08-18 NOTE — Telephone Encounter (Signed)
Please call,she needs to give you an update on patient's condition.

## 2015-08-18 NOTE — Telephone Encounter (Signed)
Pt seen today by nurse from [facility]. Indicated that would is small "about size of end of ink pen" on the left inner of butt, assessed as Stage II ulcer. Pt told pt to use barrier cream regularlly and nurse will continue to monitor over the next few weeks.

## 2015-08-27 ENCOUNTER — Telehealth: Payer: Self-pay | Admitting: Cardiovascular Disease

## 2015-08-27 NOTE — Telephone Encounter (Signed)
Returned call to [facility] Nurse. She said pt has refused her PT 2 times this week.  Pt had tooth extraction and has been in pain and did not want to do the therapy this week.  Will forward Dr Sallyanne Kuster as Juluis Rainier.

## 2015-08-27 NOTE — Telephone Encounter (Signed)
New message     Home health physical therapy calling  -patient refusing PT x 2 this week - due tooth extraction on Tuesday.

## 2015-08-31 ENCOUNTER — Telehealth: Payer: Self-pay | Admitting: Cardiovascular Disease

## 2015-08-31 NOTE — Telephone Encounter (Signed)
Returned [facility] nurse's call, advised OK to do extra visit (making up for missed visit last week - see previous notes). No further questions/concerns.

## 2015-08-31 NOTE — Telephone Encounter (Signed)
She did not see the pt at all last week. She would like one more extra visit,discharge her on Friday instead of tomorrow.She needs a verbal order for the extra visit.

## 2015-09-06 ENCOUNTER — Telehealth: Payer: Self-pay | Admitting: Cardiovascular Disease

## 2015-09-06 NOTE — Telephone Encounter (Signed)
New message    The Physical Therapist needs a new order for 2 more visit one for tomorrow and 1 for next week.

## 2015-09-06 NOTE — Telephone Encounter (Signed)
Per Dr Carlisle Cater for 2 more visit

## 2015-09-07 ENCOUNTER — Telehealth: Payer: Self-pay

## 2015-09-07 NOTE — Telephone Encounter (Signed)
Faxed signed physician's orders to evaluate and treat pressure wound to [facility].

## 2015-09-08 ENCOUNTER — Other Ambulatory Visit: Payer: Self-pay | Admitting: Cardiovascular Disease

## 2015-09-08 MED ORDER — DILTIAZEM HCL ER COATED BEADS 120 MG PO CP24: 120.0000 mg | ORAL_CAPSULE | Freq: Two times a day (BID) | ORAL | Status: DC

## 2015-09-08 NOTE — Telephone Encounter (Signed)
Rx(s) sent to pharmacy electronically.  

## 2015-09-10 ENCOUNTER — Telehealth: Payer: Self-pay

## 2015-09-10 NOTE — Telephone Encounter (Signed)
Faxed signed order allowing one more visit to ensure wound healing to Jacqueline Nguyen at [facility].

## 2015-10-01 ENCOUNTER — Telehealth: Payer: Self-pay

## 2015-10-01 NOTE — Telephone Encounter (Signed)
Faxed episode detail report and plan of care to [facility] on 09/21/15.

## 2015-10-26 ENCOUNTER — Telehealth: Payer: Self-pay | Admitting: Cardiovascular Disease

## 2015-10-26 NOTE — Telephone Encounter (Signed)
New Message [facility] orders faxed back. Stated first sent orders in May. Please call back and discuss.

## 2015-10-26 NOTE — Telephone Encounter (Signed)
Returned call to "[facility]". Spoke with Nira Conn, RN. She could not assist me with call. Stated that one of her coworkers must have called. Notified Nira Conn that requested information was faxed in June. She stated that she would relay the message.

## 2015-10-27 NOTE — Telephone Encounter (Signed)
Faxed requested information to Mariann Laster at [facility].

## 2015-10-27 NOTE — Telephone Encounter (Signed)
Jacqueline Nguyen at [place] would like a call back any time before 5pm  They need home health orders re-faxed 7/25.

## 2015-11-09 ENCOUNTER — Ambulatory Visit (INDEPENDENT_AMBULATORY_CARE_PROVIDER_SITE_OTHER): Payer: Medicare Other | Admitting: Cardiovascular Disease

## 2015-11-09 ENCOUNTER — Encounter (INDEPENDENT_AMBULATORY_CARE_PROVIDER_SITE_OTHER): Payer: Self-pay

## 2015-11-09 ENCOUNTER — Encounter: Payer: Self-pay | Admitting: Cardiovascular Disease

## 2015-11-09 VITALS — BP 130/58 | HR 58 | Ht 64.0 in | Wt 245.0 lb

## 2015-11-09 DIAGNOSIS — I272 Other secondary pulmonary hypertension: Secondary | ICD-10-CM | POA: Diagnosis not present

## 2015-11-09 DIAGNOSIS — I509 Heart failure, unspecified: Secondary | ICD-10-CM

## 2015-11-09 DIAGNOSIS — I480 Paroxysmal atrial fibrillation: Secondary | ICD-10-CM

## 2015-11-09 DIAGNOSIS — Z7901 Long term (current) use of anticoagulants: Secondary | ICD-10-CM

## 2015-11-09 DIAGNOSIS — R5381 Other malaise: Secondary | ICD-10-CM

## 2015-11-09 DIAGNOSIS — G4733 Obstructive sleep apnea (adult) (pediatric): Secondary | ICD-10-CM | POA: Diagnosis not present

## 2015-11-09 DIAGNOSIS — Z5181 Encounter for therapeutic drug level monitoring: Secondary | ICD-10-CM

## 2015-11-09 DIAGNOSIS — J9611 Chronic respiratory failure with hypoxia: Secondary | ICD-10-CM

## 2015-11-09 DIAGNOSIS — I50812 Chronic right heart failure: Secondary | ICD-10-CM | POA: Insufficient documentation

## 2015-11-09 DIAGNOSIS — I10 Essential (primary) hypertension: Secondary | ICD-10-CM

## 2015-11-09 NOTE — Progress Notes (Signed)
Cardiology Office Note    Date:  11/09/2015   ID:  Jacqueline Nguyen, DOB 08-16-1931, MRN [id number]  PCP:  Ardith Dark PA-C  Cardiologist:   Sanda Klein, MD   chief complaint: Right heart failure follow-up   History of Present Illness:  Jacqueline Nguyen is a 82 y.o. female who presents for right heart failure (secondary to obstructive sleep apnea, previous pulmonary embolism, obesity) and reported paroxysmal atrial fibrillation, Not documented in a long time.. She is accompanied today by her son.  She was seen in the emergency room in [facility] on July 19. She felt, "different", "abnormal", without being able to more precisely define her symptoms. She checked her blood pressure and it was elevated at 200/120. She called the paramedics and when they asked her to walk to the stretcher she was unable to stand by herself because her "legs were like jelly". She was monitored for about 10 hours in the emergency room. Head CT did not show any new abnormalities. CT angiogram of the chest performed due to concern for aortic dissection did not show abnormalities of the aorta other than known atherosclerosis. Incidental note was made of cysts in her pancreas. Labs were normal. She was given diuretics. Brief hospitalization for observation was offered but she declined.  Other than that event she has done better over the last 6 months. She is receiving regular physical therapy from a gentleman by the name of Roselie Awkward who is "doing a great job". She is also receiving visits from a wound care nurse for a couple of small decubitus wounds in her buttocks. Her son thinks that she is getting a little stronger.  She was trying to use the portable commode. She suddenly felt herself falling backwards. She does not recall hitting the ground but woke up sitting up with a painful tailbone. She is worried by the fact that she does not actually recall the impact. However she never fell completely to the floor. She called her  sister to help but required the assistance of EMS to get up off the floor. She remains extremely unsteady on her feet and requires a lot of assistance to move around. She uses a walker.  She has recently had carotid ultrasonography show no significant obstruction. (Performed in [facility] on July 07, 2015). He also recently wore an event monitor. She had one asymptomatic 2.6 second pause due to a blocked PAC. She submitted a very large number of symptom-activation episodes, all of which showed normal rhythm.  She denies problems with bleeding, worsening dyspnea, worsening leg edema, change in chronic dyspnea level, focal neurological events, chest pain, fever, chills, cough.  In 2009 she had a "massive" bilateral pulmonary embolism and she has long-standing systemic hypertension, morbid obesity, reactive airway disease, obstructive sleep apnea (on CPAP) and osteoporosis and degenerative arthritis. She wears oxygen at night and with activity and often uses a scooter. In 2014 she had transient right eye blindness, not long after cataract surgery. Has glaucoma. Echo in August 2015 showed normal left ventricular regional wall motion and EF 65%, no significant valvular abnormalities, estimated systolic PA pressure 40 mm Hg. Her event monitor showed frequent PACs and brief bursts of nonsustained atrial tachycardia, but there was no confirmed atrial fibrillation.    Past Medical History:  Diagnosis Date  . Arthritis   . Cataract   . Chronic respiratory failure ([place])   . Endometriosis   . HTN (hypertension)   . Hx of echocardiogram    Echo (8/15):  Mild LVH, EF 65%, no RWMA, Ao sclerosis, no AS, mod MAC, mild LAE, normal RVSF, PASP 40 mmHg  . Hx pulmonary embolism    on chronic coumadin  . Obesities, morbid ([place])   . OSA (obstructive sleep apnea)   . Osteoporosis   . PAF (paroxysmal atrial fibrillation) ([place])    Event Monitor (8/15):  Frequent PACs, brief bursts of nonsustained ATach; no AFib  .  Psoriasis   . Skin cancer   . Vertigo     Past Surgical History:  Procedure Laterality Date  . cataract surgery    . GALLBLADDER SURGERY    . NOSE SURGERY    . OOPHORECTOMY    . SKIN SURGERY    . UVULOPALATOPHARYNGOPLASTY      Current Medications: Outpatient Medications Prior to Visit  Medication Sig Dispense Refill  . albuterol (PROVENTIL HFA;VENTOLIN HFA) 108 (90 BASE) MCG/ACT inhaler Inhale 2 puffs into the lungs every 6 (six) hours as needed for wheezing.    . Ascorbic Acid (VITAMIN C PO) Take 600 mg by mouth daily.    Marland Kitchen atenolol (TENORMIN) 25 MG tablet Take 12.5 mg by mouth continuous as needed.     . AZOPT 1 % ophthalmic suspension Place 1 drop into both eyes 2 (two) times daily.    . budesonide-formoterol (SYMBICORT) 160-4.5 MCG/ACT inhaler Inhale 2 puffs into the lungs at bedtime.     . COMBIGAN 0.2-0.5 % ophthalmic solution Place 1 drop into both eyes 2 (two) times daily.    Marland Kitchen diltiazem (CARDIZEM CD) 120 MG 24 hr capsule Take 1 capsule (120 mg total) by mouth 2 (two) times daily. <PLEASE MAKE APPOINTMENT FOR REFILLS> 180 capsule 0  . fluticasone (FLOVENT HFA) 44 MCG/ACT inhaler Take 2 puffs by mouth 2 (two) times daily.    . furosemide (LASIX) 20 MG tablet Take 1 tablet (20 mg total) by mouth daily. 30 tablet 11  . Homeopathic Products (ARNICARE [place]) CREA Apply 1 application topically daily.     . hydrALAZINE (APRESOLINE) 10 MG tablet Take 1 tablet (10 mg total) by mouth 3 (three) times daily as needed (for systolic blood pressure greater than 160). 90 tablet 11  . HYDROcodone-acetaminophen (NORCO) 10-325 MG tablet Take 1 tablet by mouth daily.    Marland Kitchen levalbuterol (XOPENEX) 0.63 MG/3ML nebulizer solution Take 1 ampule by nebulization every 8 (eight) hours as needed for wheezing.    Marland Kitchen LORazepam (ATIVAN) 1 MG tablet TAKE ONE TABLET FOUR TIMES DAILY AS NEEDED    . meclizine (ANTIVERT) 25 MG tablet TAKE ONE TABLET 2-3 TIMES A DAY AS NEEDED FOR VERTIGO    . mometasone (ELOCON)  0.1 % cream Use as directed    . Multiple Minerals (MINERAL COMPLEX PO) Take 1 tablet by mouth daily.     . mupirocin ointment (BACTROBAN) 2 % Use as directed    . NON FORMULARY Place 4 L into the nose at bedtime. 3 liter as needed during day depending on extertion    . Nystatin ([place]) 100000 UNIT/GM POWD Apply topically.    Marland Kitchen OLIVE LEAF EXTRACT PO Take 450 mg by mouth daily.    Marland Kitchen Respiratory Therapy Supplies (FLUTTER) DEVI Blow through 4 times per set, three sets daily 1 each 0  . Turmeric 500 MG CAPS Take 500 mg by mouth daily as needed.    . warfarin (COUMADIN) 2.5 MG tablet Take 2.5 mg by mouth as directed.      No facility-administered medications prior to visit.  Allergies:   Ciprofloxacin; Bactrim; and Codeine   Social History   Social History  . Marital status: Widowed    Spouse name: N/A  . Number of children: N/A  . Years of education: N/A   Social History Main Topics  . Smoking status: Former Smoker    Packs/day: 1.00    Years: 2.00    Types: Cigarettes    Quit date: 04/03/1981  . Smokeless tobacco: Never Used  . Alcohol use No  . Drug use: No  . Sexual activity: Not Currently   Other Topics Concern  . None   Social History Narrative  . None     Family History:  The patient's family history includes Heart attack in her brother and father.   ROS:   Please see the history of present illness.    ROS All other systems reviewed and are negative.   PHYSICAL EXAM:   VS:  BP (!) 130/58   Pulse (!) 58   Ht _0  (1.626 m)   Wt 245 lb (111.1 kg)   BMI 42.05 kg/m    GEN: Morbidly obese, well developed, in no acute distress  HEENT: normal  Neck: no JVD, carotid bruits, or masses Cardiac: RRR; 2/6 holosystolic murmur at the left lower sternal border, no diastolic murmurs, rubs, or gallops,no edema  Respiratory:  clear to auscultation bilaterally, normal work of breathing GI: soft, nontender, nondistended, + BS MS: no deformity or atrophy  Skin: warm and  dry, no rash Neuro:  Alert and Oriented x 3, Strength and sensation are intact Psych: euthymic mood, full affect  Wt Readings from Last 3 Encounters:  11/09/15 245 lb (111.1 kg)  07/08/15 244 lb 6 oz (110.8 kg)  05/05/15 242 lb (109.8 kg)      Studies/Labs Reviewed:   EKG:  EKG is ordered today.  The ekg ordered today demonstrates Mild sinus bradycardia, poor R-wave progression, otherwise normal  Recent Labs: 03/31/2015: Hemoglobin 13.6; Platelets 399; TSH 0.691 05/05/2015: BUN 9; Creat 0.60; Potassium 4.0; Sodium 139   Lipid Panel    Component Value Date/Time   CHOL 205 (H) 08/30/2012 0550   TRIG 102 08/30/2012 0550   HDL 55 08/30/2012 0550   CHOLHDL 3.7 08/30/2012 0550   VLDL 20 08/30/2012 0550   LDLCALC 130 (H) 08/30/2012 0550     ASSESSMENT:    1. Chronic right-sided CHF (congestive heart failure) ([place])   2. Obstructive sleep apnea   3. Pulmonary hypertension ([place])   4. Chronic respiratory failure with hypoxia ([facility])   5. Anticoagulated on Coumadin   6. PAF (paroxysmal atrial fibrillation) ([place])   7. Physical deconditioning   8. Essential hypertension      PLAN:  In order of problems listed above:  1. Right HF: Morbid obesity has always made for a very difficult physical exam, but she does not appear to be excessively fluid overloaded today. Multifactorial related to previous pulmonary embolism, severe obesity, sleep apnea, chronic hypoxia. 2. OSA: Reports compliance with CPAP. Followed by Dr. Annamaria Boots.  3. PAH: Relatively mild by echo 4. Chronic hypoxemia : On home oxygen 3 L by nasal cannula. Clearly there is a component of obesity hypoventilation syndrome. See pulmonary function tests from 2015 with matched FVC and FEV1 under 50% of predicted. Possible also component of COPD related to previous smoking. 5. Warfarin: No bleeding complications 6. AFib: His has not been documented in the long time, but she is taking anticoagulation as of her history of pulmonary  embolism  anyway and is on atenolol and diltiazem for control of other atrial arrhythmia as well. She has not had episodes of sustained palpitations recently. 7. Deconditioning: Recommend she continue physical therapy and wound care encourage her to be more physically active 8. HTN: Excellent BP control today. I'm not sure I can explain the sudden increase in blood pressure that occurred on July 19. She does become quite anxious when she feels ill and I suspect this may be playing at least some role in the blood pressure elevation.    Medication Adjustments/Labs and Tests Ordered: Current medicines are reviewed at length with the patient today.  Concerns regarding medicines are outlined above.  Medication changes, Labs and Tests ordered today are listed in the Patient Instructions below. Patient Instructions  Dr Sallyanne Kuster recommends that you schedule a follow-up appointment in 6 months. You will receive a reminder letter in the mail two months in advance. If you don't receive a letter, please call our office to schedule the follow-up appointment.  If you need a refill on your cardiac medications before your next appointment, please call your pharmacy.    Signed, Sanda Klein, MD  11/09/2015 4:21 PM    [facility] [place], [place], [place]  [address] Phone: [phone]; Fax: [phone]

## 2015-11-09 NOTE — Patient Instructions (Signed)
Dr Croitoru recommends that you schedule a follow-up appointment in 6 months. You will receive a reminder letter in the mail two months in advance. If you don't receive a letter, please call our office to schedule the follow-up appointment.  If you need a refill on your cardiac medications before your next appointment, please call your pharmacy. 

## 2015-11-15 ENCOUNTER — Ambulatory Visit: Payer: Medicare Other | Admitting: Cardiovascular Disease

## 2015-12-15 ENCOUNTER — Other Ambulatory Visit: Payer: Self-pay | Admitting: Cardiovascular Disease

## 2015-12-16 ENCOUNTER — Telehealth: Payer: Self-pay | Admitting: Internal Medicine

## 2015-12-16 DIAGNOSIS — G4733 Obstructive sleep apnea (adult) (pediatric): Secondary | ICD-10-CM

## 2015-12-16 NOTE — Telephone Encounter (Signed)
Ok  Please order DME- patient needs replacement water chamber for her CPAP humidifier. She can make a routine f/u for her OSA as able.

## 2015-12-16 NOTE — Telephone Encounter (Signed)
Spoke with pt. She is needing a new water chamber for her CPAP machine. Her current one is broken. She has not seen CY in close to 1 year. Advised her that she would need an appointment soon and the pt became very upset.  CY - please advise if we can order a new water chamber. Thanks.

## 2015-12-20 NOTE — Telephone Encounter (Signed)
Order has been placed and nothing further is needed.

## 2016-01-10 ENCOUNTER — Other Ambulatory Visit: Payer: Self-pay | Admitting: Cardiovascular Disease

## 2016-01-10 DIAGNOSIS — I10 Essential (primary) hypertension: Secondary | ICD-10-CM

## 2016-01-10 DIAGNOSIS — R0602 Shortness of breath: Secondary | ICD-10-CM

## 2016-01-28 ENCOUNTER — Telehealth: Payer: Self-pay | Admitting: Cardiovascular Disease

## 2016-01-28 NOTE — Telephone Encounter (Signed)
Spoke with pt states that yesterday she was shaking all over and her hands were feeling numb she expressed that she was feeling confused and couldn't finish her sentences. Her last BP readings were today 7am 158/67 hr 58 yesterday 143/55 hr 56 131/54 hr 69. She states that sx are better today and that the shakiness comes and goes during the day but the confusion and searching for her words is worrying her. Scheduled appt with Rhonda 02-03-16_0 :30. And informed pt to go to ER for worsening sx or confusion.   Forwarding to [place] for any further advise.

## 2016-01-28 NOTE — Telephone Encounter (Signed)
Pt calling regarding being in Afib, having symptoms for several days wants to talk to a nurse

## 2016-01-31 NOTE — Telephone Encounter (Signed)
PT NOTIFIED  

## 2016-01-31 NOTE — Telephone Encounter (Signed)
If her coumadin is ok, she should not have had a stroke. If her coumadin is not ok, she needs to be seen in the ER, or somewhere they can do scans for a stroke. Otherwise, if she wishes to be evaluated in the ER, fine. If not, keep appt.

## 2016-02-01 NOTE — Progress Notes (Signed)
CARDIOLOGY OFFICE NOTE  Date:  02/02/2016    Jacqueline Nguyen Date of Birth: 1931-09-19 Medical Record #[id number]  PCP:  Sheral Apley  Cardiologist:  Croitoru  Chief Complaint  Patient presents with  . Congestive Heart Failure    Work in visit - seen for Dr. Sallyanne Kuster    History of Present Illness: Jacqueline Nguyen is a 82 y.o. female who presents today for a work in visit. Seen for Dr. Sallyanne Kuster.   She has a history of right heart failure (secondary to obstructive sleep apnea, previous pulmonary embolism, obesity) and reported paroxysmal atrial fibrillation but not noted  in a long time. She is on chronic oxygen therapy.   Last seen by Dr. Sallyanne Kuster back in August after a visit to the [facility] ER in July. Felt "different". BP sky high. Evaluation otherwise benign. She has required PT and visits from wound care staff. ?syncopal spell noted at last visit - was trying to use [facility] - felt herself fall backwards - woke up with a painful tailbone - did not actually recall the impact. Required EMS to get off the floor. Very unsteady. She has had prior event monitor - she had one asymptomatic 2.6 second pause due to a blocked PAC. She submitted a very large number of symptom-activation episodes, all of which showed normal rhythm.  Phone call earlier this week - "Spoke with pt states that yesterday she was shaking all over and her hands were feeling numb she expressed that she was feeling confused and couldn't finish her sentences. Her last BP readings were today 7am 158/67 hr 58 yesterday 143/55 hr 56 131/54 hr 69. She states that sx are better today and that the shakiness comes and goes during the day but the confusion and searching for her words is worrying her. Scheduled appt with Rhonda 02-03-16_0 :30. And informed pt to go to ER for worsening sx or confusion".    Was advised that "If her coumadin is ok, she should not have had a stroke. If her coumadin is not ok, she needs to be seen in the  ER, or somewhere they can do scans for a stroke. Otherwise, if she wishes to be evaluated in the ER, fine. If not, keep appt."  Comes in today. Here with her son. Rambling. Not really clear why she is here. Told my CMA that she was here to discuss her narcotics and Ativan - says she has had a long conversation with Dr. Tonye Becket regarding this. Does not mention this to me at all. Tells me that she thinks her heart failure is getting worse. BP running high - but using a wrist cuff. Trembling. Shaky. Keeps going back to when she was in the hospital back in July. Hard to keep focused on reason for today's visit. No chest pain. Says she has swelling but does not use salt. She is "vegan". Tells me she has lost weight but she is not able to stand. Monitors coumadin thru home monitoring. She is having back pain - wonders if it is a UTI. Hard for her to lie flat.   Past Medical History:  Diagnosis Date  . Arthritis   . Cataract   . Chronic respiratory failure ([place])   . Endometriosis   . HTN (hypertension)   . Hx of echocardiogram    Echo (8/15):  Mild LVH, EF 65%, no RWMA, Ao sclerosis, no AS, mod MAC, mild LAE, normal RVSF, PASP 40 mmHg  . Hx pulmonary embolism  on chronic coumadin  . Obesities, morbid ([place])   . OSA (obstructive sleep apnea)   . Osteoporosis   . PAF (paroxysmal atrial fibrillation) ([place])    Event Monitor (8/15):  Frequent PACs, brief bursts of nonsustained ATach; no AFib  . Psoriasis   . Skin cancer   . Vertigo     Past Surgical History:  Procedure Laterality Date  . cataract surgery    . GALLBLADDER SURGERY    . NOSE SURGERY    . OOPHORECTOMY    . SKIN SURGERY    . UVULOPALATOPHARYNGOPLASTY       Medications: Current Outpatient Prescriptions  Medication Sig Dispense Refill  . albuterol (PROVENTIL HFA;VENTOLIN HFA) 108 (90 BASE) MCG/ACT inhaler Inhale 2 puffs into the lungs every 6 (six) hours as needed for wheezing.    . Ascorbic Acid (VITAMIN C PO) Take 600 mg by  mouth daily.    Marland Kitchen atenolol (TENORMIN) 25 MG tablet Take 12.5 mg by mouth continuous as needed.     . AZOPT 1 % ophthalmic suspension Place 1 drop into both eyes 2 (two) times daily.    . budesonide-formoterol (SYMBICORT) 160-4.5 MCG/ACT inhaler Inhale 2 puffs into the lungs at bedtime.     . COMBIGAN 0.2-0.5 % ophthalmic solution Place 1 drop into both eyes 2 (two) times daily.    Marland Kitchen diltiazem (CARDIZEM CD) 120 MG 24 hr capsule TAKE ONE (1) CAPSULE BY MOUTH 2 TIMES DAILY <PLEASE MAKE APPOINTMENT FOR REFILLS> 180 capsule 3  . fluticasone (FLOVENT HFA) 44 MCG/ACT inhaler Take 2 puffs by mouth 2 (two) times daily.    . furosemide (LASIX) 20 MG tablet TAKE ONE (1) TABLET EACH DAY 30 tablet 0  . Homeopathic Products (ARNICARE ARNICA) CREA Apply 1 application topically daily.     . hydrALAZINE (APRESOLINE) 10 MG tablet Take 1 tablet (10 mg total) by mouth 3 (three) times daily as needed (for systolic blood pressure greater than 160). 90 tablet 11  . HYDROcodone-acetaminophen (NORCO) 10-325 MG tablet Take 1 tablet by mouth daily.    Marland Kitchen levalbuterol (XOPENEX) 0.63 MG/3ML nebulizer solution Take 1 ampule by nebulization every 8 (eight) hours as needed for wheezing.    Marland Kitchen LORazepam (ATIVAN) 1 MG tablet TAKE ONE TABLET FOUR TIMES DAILY AS NEEDED    . meclizine (ANTIVERT) 25 MG tablet TAKE ONE TABLET 2-3 TIMES A DAY AS NEEDED FOR VERTIGO    . mometasone (ELOCON) 0.1 % cream Use as directed    . Multiple Minerals (MINERAL COMPLEX PO) Take 1 tablet by mouth daily.     . mupirocin ointment (BACTROBAN) 2 % Use as directed    . NON FORMULARY Place 4 L into the nose at bedtime. 3 liter as needed during day depending on extertion    . Nystatin ([place]) 100000 UNIT/GM POWD Apply topically.    Marland Kitchen OLIVE LEAF EXTRACT PO Take 450 mg by mouth daily.    Marland Kitchen Respiratory Therapy Supplies (FLUTTER) DEVI Blow through 4 times per set, three sets daily 1 each 0  . Turmeric 500 MG CAPS Take 500 mg by mouth daily as needed.    .  warfarin (COUMADIN) 2.5 MG tablet Take 2.5 mg by mouth as directed.      No current facility-administered medications for this visit.     Allergies: Allergies  Allergen Reactions  . Ciprofloxacin   . Bactrim Rash  . Codeine Nausea Only    Social History: The patient  reports that she quit smoking about 34  years ago. Her smoking use included Cigarettes. She has a 2.00 pack-year smoking history. She has never used smokeless tobacco. She reports that she does not drink alcohol or use drugs.   Family History: The patient's family history includes Heart attack in her brother and father.   Review of Systems: Please see the history of present illness.   Otherwise, the review of systems is positive for none.   All other systems are reviewed and negative.   Physical Exam: VS:  BP 130/62   Pulse (!) 56   Ht _0  (1.676 m)  .  BMI There is no height or weight on file to calculate BMI.  Wt Readings from Last 3 Encounters:  11/09/15 245 lb (111.1 kg)  07/08/15 244 lb 6 oz (110.8 kg)  05/05/15 242 lb (109.8 kg)    General: Morbidly obese. Wheelchair bound. Not able to stand. Was not able to be weighed. She is very hard of hearing. Rambling. She does not appear to be in acute distress.   HEENT: No obvious abnormality.   Neck: Thick.  Cardiac: Regular rate and rhythm. No murmurs, rubs, or gallops. Legs are full but not really with edema. Respiratory:  Lungs are clear to auscultation bilaterally with normal work of breathing.  GI: Obese.  MS: No deformity or atrophy. Gait not tested.  Skin: Warm and dry. Color is normal.  Neuro:  Strength and sensation are intact and no gross focal deficits noted.  Psych: Alert, appropriate and with normal affect.   LABORATORY DATA:  EKG:  EKG is not ordered today.   Lab Results  Component Value Date   WBC 7.8 03/31/2015   HGB 13.6 03/31/2015   HCT 41.4 03/31/2015   PLT 399 03/31/2015   GLUCOSE 98 05/05/2015   CHOL 205 (H) 08/30/2012   TRIG  102 08/30/2012   HDL 55 08/30/2012   LDLCALC 130 (H) 08/30/2012   ALT 12 08/20/2013   AST 20 08/20/2013   NA 139 05/05/2015   K 4.0 05/05/2015   CL 101 05/05/2015   CREATININE 0.60 05/05/2015   BUN 9 05/05/2015   CO2 32 (H) 05/05/2015   TSH 0.691 03/31/2015   INR 2.23 (H) 08/31/2012   HGBA1C 5.8 (H) 08/30/2012   Lab Results  Component Value Date   INR 2.23 (H) 08/31/2012   INR 2.35 (H) 08/30/2012   INR 2.42 (H) 08/29/2012    BNP (last 3 results) No results for input(s): BNP in the last 8760 hours.  ProBNP (last 3 results) No results for input(s): PROBNP in the last 8760 hours.   Other Studies Reviewed Today:  Echo Study Conclusions from 11/2013  - Left ventricle: The cavity size was normal. Wall thickness was increased in a pattern of mild LVH. The estimated ejection fraction was 65%. Wall motion was normal; there were no regional wall motion abnormalities. - Aortic valve: Sclerosis without stenosis. There was no significant regurgitation. - Mitral valve: Moderately calcified annulus. There was no significant regurgitation. - Left atrium: The atrium was mildly dilated. - Right ventricle: The cavity size was normal. Systolic function was normal. - Pulmonary arteries: PA peak pressure: 40 mm Hg (S).  Notes Recorded by Sanda Klein, MD on 11/25/2013 at 2:55 PM Echo shows reassuring findings. LVEF is normal. PA pressure is increased (as expected from previous PE and OSA), but not as bad as I had feared.   Assessment/Plan: 1. Right HF: Morbid obesity has always made for a very difficult physical exam - but  she feels it is getting worse. She is not able to stand. Will check BNP. Would hold on additional diuresis for now. Will try to get her echo updated - this may be challenging based on her size and I told her this today that our options may be getting more and more limited.  2. OSA: on CPAP. Followed by Dr. Annamaria Boots.  3. PAH: Relatively mild by  echo 4. Chronic hypoxemia : On home oxygen 3 L by nasal cannula. Clearly there is a component of obesity hypoventilation syndrome. Probable component of COPD related to previous smoking. 5. Warfarin: No bleeding complications - she does home monitoring overseen by her PCP 6. AFib: Has not been documented in the long time, but she is taking anticoagulation as of her history of pulmonary embolism anyway and is on atenolol and diltiazem for control of other atrial arrhythmia as well. She has not had episodes of sustained palpitations recently. Her rhythm is quite regular today on exam.  7. Deconditioning: Do not see this changing.   8.  HTN: BP is fine here today. May need to change out her cuff.   9. Morbid obesity - this would be the crux of her issues. I do not see this  improving. Overall prognosis is tenuous at best. May need to consider palliative  care measures going forward.   Current medicines are reviewed with the patient today.  The patient does not have concerns regarding medicines other than what has been noted above.  The following changes have been made:  See above.  Labs/ tests ordered today include:    Orders Placed This Encounter  Procedures  . Brain natriuretic peptide  . Basic metabolic panel  . CBC  . Hepatic function panel  . ECHOCARDIOGRAM COMPLETE     Disposition:   FU with Dr. Chriss Czar after studies complete. For now, no change in her medicines.   Patient is agreeable to this plan and will call if any problems develop in the interim.   Signed: Burtis Junes, RN, ANP-C 02/02/2016 12:19 PM  [facility] [address] [place],   [address] Phone: [phone] Fax: [phone]

## 2016-02-02 ENCOUNTER — Ambulatory Visit (INDEPENDENT_AMBULATORY_CARE_PROVIDER_SITE_OTHER): Payer: Medicare Other | Admitting: Nurse Practitioner

## 2016-02-02 ENCOUNTER — Encounter: Payer: Self-pay | Admitting: Nurse Practitioner

## 2016-02-02 VITALS — BP 130/62 | HR 56 | Ht 66.0 in

## 2016-02-02 DIAGNOSIS — R0600 Dyspnea, unspecified: Secondary | ICD-10-CM | POA: Diagnosis not present

## 2016-02-02 LAB — CBC
HCT: 43.8 % (ref 35.0–45.0)
Hemoglobin: 14 g/dL (ref 11.7–15.5)
MCH: 29.9 pg (ref 27.0–33.0)
MCHC: 32 g/dL (ref 32.0–36.0)
MCV: 93.6 fL (ref 80.0–100.0)
MPV: 10.2 fL (ref 7.5–12.5)
Platelets: 399 10*3/uL (ref 140–400)
RBC: 4.68 MIL/uL (ref 3.80–5.10)
RDW: 13.4 % (ref 11.0–15.0)
WBC: 10.9 10*3/uL — ABNORMAL HIGH (ref 3.8–10.8)

## 2016-02-02 LAB — BASIC METABOLIC PANEL
BUN: 9 mg/dL (ref 7–25)
CO2: 32 mmol/L — ABNORMAL HIGH (ref 20–31)
Calcium: 9.4 mg/dL (ref 8.6–10.4)
Chloride: 101 mmol/L (ref 98–110)
Creat: 0.77 mg/dL (ref 0.60–0.88)
Glucose, Bld: 89 mg/dL (ref 65–99)
Potassium: 4.3 mmol/L (ref 3.5–5.3)
Sodium: 143 mmol/L (ref 135–146)

## 2016-02-02 LAB — HEPATIC FUNCTION PANEL
ALT: 10 U/L (ref 6–29)
AST: 14 U/L (ref 10–35)
Albumin: 3.8 g/dL (ref 3.6–5.1)
Alkaline Phosphatase: 58 U/L (ref 33–130)
Bilirubin, Direct: 0.1 mg/dL (ref <=0.2)
Indirect Bilirubin: 0.3 mg/dL (ref 0.2–1.2)
Total Bilirubin: 0.4 mg/dL (ref 0.2–1.2)
Total Protein: 6.5 g/dL (ref 6.1–8.1)

## 2016-02-02 NOTE — Progress Notes (Signed)
Sounds like most of her previous visits.Marland KitchenMarland Kitchen

## 2016-02-02 NOTE — Patient Instructions (Addendum)
We will be checking the following labs today - BMET, CBC, HPF and BNP   Medication Instructions:    Continue with your current medicines for now.     Testing/Procedures To Be Arranged:  Echocardiogram  Follow-Up:   See Dr. Chriss Czar in one month for follow up.     Other Special Instructions:   N/A    If you need a refill on your cardiac medications before your next appointment, please call your pharmacy.   Call the [place] office at [phone] if you have any questions, problems or concerns.

## 2016-02-03 ENCOUNTER — Encounter (HOSPITAL_BASED_OUTPATIENT_CLINIC_OR_DEPARTMENT_OTHER): Payer: Self-pay | Admitting: *Deleted

## 2016-02-03 ENCOUNTER — Emergency Department (HOSPITAL_BASED_OUTPATIENT_CLINIC_OR_DEPARTMENT_OTHER)
Admission: EM | Admit: 2016-02-03 | Discharge: 2016-02-03 | Disposition: A | Discharge status: Home / Self Care | Payer: Medicare Other | Attending: Emergency Medicine | Admitting: Emergency Medicine

## 2016-02-03 ENCOUNTER — Ambulatory Visit: Payer: Medicare Other | Admitting: Physician Assistant

## 2016-02-03 DIAGNOSIS — J9610 Chronic respiratory failure, unspecified whether with hypoxia or hypercapnia: Secondary | ICD-10-CM | POA: Insufficient documentation

## 2016-02-03 DIAGNOSIS — Z7901 Long term (current) use of anticoagulants: Secondary | ICD-10-CM | POA: Insufficient documentation

## 2016-02-03 DIAGNOSIS — I110 Hypertensive heart disease with heart failure: Secondary | ICD-10-CM | POA: Diagnosis not present

## 2016-02-03 DIAGNOSIS — Z79899 Other long term (current) drug therapy: Secondary | ICD-10-CM | POA: Insufficient documentation

## 2016-02-03 DIAGNOSIS — Z85828 Personal history of other malignant neoplasm of skin: Secondary | ICD-10-CM | POA: Insufficient documentation

## 2016-02-03 DIAGNOSIS — R42 Dizziness and giddiness: Secondary | ICD-10-CM | POA: Insufficient documentation

## 2016-02-03 DIAGNOSIS — Z87891 Personal history of nicotine dependence: Secondary | ICD-10-CM | POA: Diagnosis not present

## 2016-02-03 DIAGNOSIS — I509 Heart failure, unspecified: Secondary | ICD-10-CM | POA: Insufficient documentation

## 2016-02-03 DIAGNOSIS — R531 Weakness: Secondary | ICD-10-CM | POA: Diagnosis not present

## 2016-02-03 LAB — CBC WITH DIFFERENTIAL/PLATELET
BASOS ABS: 0 10*3/uL (ref 0.0–0.1)
Basophils Relative: 0 %
EOS PCT: 2 %
Eosinophils Absolute: 0.1 10*3/uL (ref 0.0–0.7)
HCT: 44.4 % (ref 36.0–46.0)
Hemoglobin: 14.1 g/dL (ref 12.0–15.0)
LYMPHS ABS: 1.4 10*3/uL (ref 0.7–4.0)
LYMPHS PCT: 21 %
MCH: 29.9 pg (ref 26.0–34.0)
MCHC: 31.8 g/dL (ref 30.0–36.0)
MCV: 94.3 fL (ref 78.0–100.0)
MONO ABS: 1 10*3/uL (ref 0.1–1.0)
Monocytes Relative: 14 %
Neutro Abs: 4.4 10*3/uL (ref 1.7–7.7)
Neutrophils Relative %: 63 %
PLATELETS: 338 10*3/uL (ref 150–400)
RBC: 4.71 MIL/uL (ref 3.87–5.11)
RDW: 14 % (ref 11.5–15.5)
WBC: 7 10*3/uL (ref 4.0–10.5)

## 2016-02-03 LAB — URINALYSIS, ROUTINE W REFLEX MICROSCOPIC
Bilirubin Urine: NEGATIVE
GLUCOSE, UA: NEGATIVE mg/dL
HGB URINE DIPSTICK: NEGATIVE
KETONES UR: NEGATIVE mg/dL
Leukocytes, UA: NEGATIVE
Nitrite: NEGATIVE
PROTEIN: NEGATIVE mg/dL
Specific Gravity, Urine: 1.009 (ref 1.005–1.030)
pH: 7 (ref 5.0–8.0)

## 2016-02-03 LAB — BASIC METABOLIC PANEL
Anion gap: 6 (ref 5–15)
BUN: 10 mg/dL (ref 6–20)
CALCIUM: 9.3 mg/dL (ref 8.9–10.3)
CO2: 32 mmol/L (ref 22–32)
Chloride: 103 mmol/L (ref 101–111)
Creatinine, Ser: 0.59 mg/dL (ref 0.44–1.00)
GFR calc Af Amer: >60 mL/min (ref >60)
GLUCOSE: 124 mg/dL — AB (ref 65–99)
Potassium: 3.7 mmol/L (ref 3.5–5.1)
Sodium: 141 mmol/L (ref 135–145)

## 2016-02-03 LAB — BRAIN NATRIURETIC PEPTIDE: Brain Natriuretic Peptide: 141.9 pg/mL — ABNORMAL HIGH (ref <100)

## 2016-02-03 NOTE — ED Notes (Signed)
Report given to [facility] EMT with [facility].

## 2016-02-03 NOTE — ED Provider Notes (Signed)
[place] DEPT [facility] Provider Note   CSN: [id number] Arrival date & time: 02/03/16  0805     History   Chief Complaint Chief Complaint  Patient presents with  . Dizziness    HPI Jacqueline Nguyen is a 82 y.o. female.  82 yo F with a chief complaint of vertigo. Patient states that she took her medicines this morning and felt like the whole rolled was spinning around her. This was sudden severe and then spontaneously resolved. After which she feels that she is a bit shaky and having some difficulty with word finding. Patient was recently seen by the cardiologist yesterday and had the same thing documented. She said this is been going on for at least 2 months that she's been shaky and having some word finding issues. Her dizziness is worse with head movement and better with keeping still. Has a history of vertigo which she is on meclizine. She also thinks that she has a kidney infection, she is unsure if she's on any antibiotic therapy. She has had an unsteady gait for many years and does not feel like this is worse.   The history is provided by the patient.  Dizziness  Quality:  Head spinning Severity:  Moderate Onset quality:  Sudden Duration:  1 hour Timing:  Constant Progression:  Partially resolved Chronicity:  Chronic Context: head movement   Relieved by:  Being still Worsened by:  Nothing Ineffective treatments:  None tried Associated symptoms: weakness   Associated symptoms: no chest pain, no headaches, no nausea, no palpitations, no shortness of breath and no vomiting     Past Medical History:  Diagnosis Date  . Arthritis   . Cataract   . Chronic respiratory failure ([place])   . Endometriosis   . HTN (hypertension)   . Hx of echocardiogram    Echo (8/15):  Mild LVH, EF 65%, no RWMA, Ao sclerosis, no AS, mod MAC, mild LAE, normal RVSF, PASP 40 mmHg  . Hx pulmonary embolism    on chronic coumadin  . Obesities, morbid ([place])   . OSA (obstructive sleep apnea)   .  Osteoporosis   . PAF (paroxysmal atrial fibrillation) ([place])    Event Monitor (8/15):  Frequent PACs, brief bursts of nonsustained ATach; no AFib  . Psoriasis   . Skin cancer   . Vertigo     Patient Active Problem List   Diagnosis Date Noted  . Chronic right-sided CHF (congestive heart failure) 11/09/2015  . Physical deconditioning 11/09/2015  . DVT (deep venous thrombosis) ([place]) 12/28/2014  . Bronchitis, chronic obstructive, with exacerbation ([place]) 12/28/2014  . History of pulmonary embolism 02/24/2014  . Chronic respiratory failure ([place]) 11/26/2013  . Pulmonary hypertension 11/22/2013  . HTN (hypertension) 09/01/2013  . Blurred vision, right eye 08/29/2012  . Morbid obesity ([place])   . Blood clotting disorder ([place])   . Cataract   . Obstructive sleep apnea   . Arthritis   . Vertigo   . Anticoagulated on Coumadin   . PAF (paroxysmal atrial fibrillation) ([place]) 02/05/2012    Past Surgical History:  Procedure Laterality Date  . cataract surgery    . GALLBLADDER SURGERY    . NOSE SURGERY    . OOPHORECTOMY    . SKIN SURGERY    . UVULOPALATOPHARYNGOPLASTY      OB History    No data available       Home Medications    Prior to Admission medications   Medication Sig Start Date End Date Taking? Authorizing Provider  albuterol (PROVENTIL HFA;VENTOLIN HFA) 108 (90 BASE) MCG/ACT inhaler Inhale 2 puffs into the lungs every 6 (six) hours as needed for wheezing.   Yes Historical Provider, MD  amLODipine (NORVASC) 5 MG tablet Take 5 mg by mouth daily.   Yes Historical Provider, MD  Ascorbic Acid (VITAMIN C PO) Take 600 mg by mouth daily.   Yes Historical Provider, MD  atenolol (TENORMIN) 25 MG tablet Take 12.5 mg by mouth continuous as needed.  02/01/15  Yes Historical Provider, MD  AZOPT 1 % ophthalmic suspension Place 1 drop into both eyes 2 (two) times daily. 06/29/15  Yes Historical Provider, MD  COMBIGAN 0.2-0.5 % ophthalmic solution Place 1 drop into both eyes 2 (two) times  daily. 07/01/15  Yes Historical Provider, MD  diltiazem (CARDIZEM CD) 120 MG 24 hr capsule TAKE ONE (1) CAPSULE BY MOUTH 2 TIMES DAILY <PLEASE MAKE APPOINTMENT FOR REFILLS> 12/15/15  Yes Mihai Croitoru, MD  fluticasone (FLOVENT HFA) 44 MCG/ACT inhaler Take 2 puffs by mouth 2 (two) times daily. 09/11/14  Yes Historical Provider, MD  furosemide (LASIX) 20 MG tablet TAKE ONE (1) TABLET EACH DAY 01/10/16  Yes Mihai Croitoru, MD  Homeopathic Products (ARNICARE ARNICA) CREA Apply 1 application topically daily.    Yes Historical Provider, MD  HYDROcodone-acetaminophen (NORCO) 10-325 MG tablet Take 1 tablet by mouth daily. 02/01/15  Yes Historical Provider, MD  levalbuterol Penne Lash) 0.63 MG/3ML nebulizer solution Take 1 ampule by nebulization every 8 (eight) hours as needed for wheezing.   Yes Historical Provider, MD  LORazepam (ATIVAN) 1 MG tablet TAKE ONE TABLET FOUR TIMES DAILY AS NEEDED 06/29/14  Yes Historical Provider, MD  meclizine (ANTIVERT) 25 MG tablet TAKE ONE TABLET 2-3 TIMES A DAY AS NEEDED FOR VERTIGO 06/30/14  Yes Historical Provider, MD  Multiple Minerals (MINERAL COMPLEX PO) Take 1 tablet by mouth daily.    Yes Historical Provider, MD  NON FORMULARY Place 4 L into the nose at bedtime. 3 liter as needed during day depending on extertion   Yes Historical Provider, MD  Nystatin ([place]) 100000 UNIT/GM POWD Apply topically.   Yes Historical Provider, MD  OLIVE LEAF EXTRACT PO Take 450 mg by mouth daily.   Yes Historical Provider, MD  Respiratory Therapy Supplies (FLUTTER) DEVI Blow through 4 times per set, three sets daily 12/28/14  Yes Deneise Lever, MD  Turmeric 500 MG CAPS Take 500 mg by mouth daily as needed.   Yes Historical Provider, MD  warfarin (COUMADIN) 2.5 MG tablet Take 2.5 mg by mouth as directed.  02/03/14  Yes Historical Provider, MD    Family History Family History  Problem Relation Age of Onset  . Heart attack Father   . Heart attack Brother   . Stroke Neg Hx     Social  History Social History  Substance Use Topics  . Smoking status: Former Smoker    Packs/day: 1.00    Years: 2.00    Types: Cigarettes    Quit date: 04/03/1981  . Smokeless tobacco: Never Used  . Alcohol use No     Allergies   Ciprofloxacin; Bactrim; and Codeine   Review of Systems Review of Systems  Constitutional: Negative for chills and fever.  HENT: Negative for congestion and rhinorrhea.   Eyes: Negative for redness and visual disturbance.  Respiratory: Negative for shortness of breath and wheezing.   Cardiovascular: Negative for chest pain and palpitations.  Gastrointestinal: Negative for nausea and vomiting.  Genitourinary: Negative for dysuria and urgency.  Musculoskeletal: Negative  for arthralgias and myalgias.  Skin: Negative for pallor and wound.  Neurological: Positive for dizziness, speech difficulty and weakness. Negative for headaches.     Physical Exam Updated Vital Signs BP (!) 158/47 (BP Location: Right Arm)   Pulse 65   Temp 98.5 F (36.9 C) (Oral)   Resp 16   Ht _0  (1.676 m)   Wt 245 lb (111.1 kg)   SpO2 95%   BMI 39.54 kg/m   Physical Exam  Constitutional: She is oriented to person, place, and time. She appears well-developed and well-nourished. No distress.  Obese  HENT:  Head: Normocephalic and atraumatic.  Eyes: EOM are normal. Pupils are equal, round, and reactive to light.  Neck: Normal range of motion. Neck supple.  Cardiovascular: Normal rate and regular rhythm.  Exam reveals no gallop and no friction rub.   No murmur heard. Pulmonary/Chest: Effort normal. She has no wheezes. She has no rales.  Abdominal: Soft. She exhibits no distension and no mass. There is no tenderness. There is no guarding.  Musculoskeletal: She exhibits no edema or tenderness.  Neurological: She is alert and oriented to person, place, and time. She has normal strength. No cranial nerve deficit or sensory deficit. Coordination normal. GCS eye subscore is 4. GCS  verbal subscore is 5. GCS motor subscore is 6. She displays no Babinski's sign on the right side. She displays no Babinski's sign on the left side.  Reflex Scores:      Tricep reflexes are 2+ on the right side and 2+ on the left side.      Bicep reflexes are 2+ on the right side and 2+ on the left side.      Brachioradialis reflexes are 2+ on the right side and 2+ on the left side.      Patellar reflexes are 2+ on the right side and 2+ on the left side.      Achilles reflexes are 2+ on the right side and 2+ on the left side. Patient has an unsteady gait at baseline gait deferred.  Skin: Skin is warm and dry. She is not diaphoretic.  Psychiatric: She has a normal mood and affect. Her behavior is normal.  Nursing note and vitals reviewed.    ED Treatments / Results  Labs (all labs ordered are listed, but only abnormal results are displayed) Labs Reviewed  BASIC METABOLIC PANEL - Abnormal; Notable for the following:       Result Value   Glucose, Bld 124 (*)    All other components within normal limits  CBC WITH DIFFERENTIAL/PLATELET  URINALYSIS, ROUTINE W REFLEX MICROSCOPIC (NOT AT [facility])    EKG  EKG Interpretation  Date/Time:  Thursday February 03 2016 08:16:12 EDT Ventricular Rate:  59 PR Interval:    QRS Duration: 96 QT Interval:  442 QTC Calculation: 438 R Axis:   57 Text Interpretation:  Sinus rhythm Low voltage, precordial leads flipped t wave in lead III now upright Otherwise no significant change Confirmed by Tobey Schmelzle MD, DANIEL ([id number]) on 02/03/2016 8:22:04 AM       Radiology No results found.  Procedures Procedures (including critical care time)  Medications Ordered in ED Medications - No data to display   Initial Impression / Assessment and Plan / ED Course  I have reviewed the triage vital signs and the nursing notes.  Pertinent labs & imaging results that were available during my care of the patient were reviewed by me and considered in my medical decision  making (  see chart for details).  Clinical Course    82 yo F With a chief complaint of vertigo. She had an episode of this that lasted only a few seconds and now has resolved. This is a chronic issue for this patient. She has a mother symptoms have been going on for at least the past 2 months if not longer. I will have the patient follow-up with her family physician. Check basic labs. EKG with no significant findings.  9:26 AM:  I have discussed the diagnosis/risks/treatment options with the patient and family and believe the pt to be eligible for discharge home to follow-up with PCP. We also discussed returning to the ED immediately if new or worsening sx occur. We discussed the sx which are most concerning (e.g., weakness) that necessitate immediate return. Medications administered to the patient during their visit and any new prescriptions provided to the patient are listed below.  Medications given during this visit Medications - No data to display   The patient appears reasonably screen and/or stabilized for discharge and I doubt any other medical condition or other [facility] requiring further screening, evaluation, or treatment in the ED at this time prior to discharge.    Final Clinical Impressions(s) / ED Diagnoses   Final diagnoses:  Dizziness    New Prescriptions New Prescriptions   No medications on file     Deno Etienne, DO 02/03/16 [id number]

## 2016-02-03 NOTE — ED Notes (Signed)
Debbie--Sister who lives with pt. [phone]

## 2016-02-03 NOTE — ED Triage Notes (Signed)
States room started spinning while sitting in chair and feels weak. Lives with sister who reports pt has vertigo and has anxiety when she gets dizzy.

## 2016-02-03 NOTE — ED Notes (Signed)
PT care turned over to [facility] staff to transport back to her home.

## 2016-02-03 NOTE — ED Notes (Signed)
Pt sister called per pt request. Relayed that pt is being discharged and [facility] has been called to transport her.

## 2016-02-03 NOTE — ED Notes (Signed)
[facility] has been called to transport patient to home address.

## 2016-02-04 ENCOUNTER — Telehealth: Payer: Self-pay | Admitting: Nurse Practitioner

## 2016-02-04 NOTE — Telephone Encounter (Signed)
New message  Pt is returning call about results of bloodwork  Please call back

## 2016-02-07 NOTE — Telephone Encounter (Signed)
Not sure why this came to me

## 2016-02-08 NOTE — Progress Notes (Signed)
Thank you.

## 2016-02-22 ENCOUNTER — Ambulatory Visit (HOSPITAL_COMMUNITY): Payer: Medicare Other | Attending: Cardiovascular Disease

## 2016-02-22 DIAGNOSIS — I082 Rheumatic disorders of both aortic and tricuspid valves: Secondary | ICD-10-CM | POA: Diagnosis not present

## 2016-02-22 DIAGNOSIS — I350 Nonrheumatic aortic (valve) stenosis: Secondary | ICD-10-CM | POA: Insufficient documentation

## 2016-02-22 DIAGNOSIS — R0600 Dyspnea, unspecified: Secondary | ICD-10-CM

## 2016-02-25 ENCOUNTER — Observation Stay (HOSPITAL_COMMUNITY)
Admission: EM | Admit: 2016-02-25 | Discharge: 2016-02-26 | Disposition: A | Discharge status: Home Health | Payer: Medicare Other | Attending: Internal Medicine | Admitting: Internal Medicine

## 2016-02-25 ENCOUNTER — Encounter (HOSPITAL_COMMUNITY): Payer: Self-pay

## 2016-02-25 ENCOUNTER — Other Ambulatory Visit: Payer: Self-pay

## 2016-02-25 ENCOUNTER — Emergency Department (HOSPITAL_COMMUNITY): Payer: Medicare Other

## 2016-02-25 DIAGNOSIS — Z86711 Personal history of pulmonary embolism: Secondary | ICD-10-CM | POA: Diagnosis not present

## 2016-02-25 DIAGNOSIS — R079 Chest pain, unspecified: Principal | ICD-10-CM | POA: Insufficient documentation

## 2016-02-25 DIAGNOSIS — G8929 Other chronic pain: Secondary | ICD-10-CM | POA: Diagnosis not present

## 2016-02-25 DIAGNOSIS — Z87891 Personal history of nicotine dependence: Secondary | ICD-10-CM | POA: Diagnosis not present

## 2016-02-25 DIAGNOSIS — Z7901 Long term (current) use of anticoagulants: Secondary | ICD-10-CM | POA: Diagnosis not present

## 2016-02-25 DIAGNOSIS — R5381 Other malaise: Secondary | ICD-10-CM | POA: Diagnosis present

## 2016-02-25 DIAGNOSIS — Z6839 Body mass index (BMI) 39.0-39.9, adult: Secondary | ICD-10-CM | POA: Diagnosis not present

## 2016-02-25 DIAGNOSIS — J9611 Chronic respiratory failure with hypoxia: Secondary | ICD-10-CM | POA: Insufficient documentation

## 2016-02-25 DIAGNOSIS — Z79891 Long term (current) use of opiate analgesic: Secondary | ICD-10-CM | POA: Insufficient documentation

## 2016-02-25 DIAGNOSIS — M549 Dorsalgia, unspecified: Secondary | ICD-10-CM | POA: Insufficient documentation

## 2016-02-25 DIAGNOSIS — Z7951 Long term (current) use of inhaled steroids: Secondary | ICD-10-CM | POA: Insufficient documentation

## 2016-02-25 DIAGNOSIS — I480 Paroxysmal atrial fibrillation: Secondary | ICD-10-CM | POA: Diagnosis not present

## 2016-02-25 DIAGNOSIS — I5032 Chronic diastolic (congestive) heart failure: Secondary | ICD-10-CM | POA: Diagnosis not present

## 2016-02-25 DIAGNOSIS — R002 Palpitations: Secondary | ICD-10-CM | POA: Diagnosis not present

## 2016-02-25 DIAGNOSIS — F419 Anxiety disorder, unspecified: Secondary | ICD-10-CM | POA: Insufficient documentation

## 2016-02-25 DIAGNOSIS — J9610 Chronic respiratory failure, unspecified whether with hypoxia or hypercapnia: Secondary | ICD-10-CM | POA: Diagnosis present

## 2016-02-25 DIAGNOSIS — I110 Hypertensive heart disease with heart failure: Secondary | ICD-10-CM | POA: Insufficient documentation

## 2016-02-25 DIAGNOSIS — Z86718 Personal history of other venous thrombosis and embolism: Secondary | ICD-10-CM | POA: Diagnosis not present

## 2016-02-25 DIAGNOSIS — Z79899 Other long term (current) drug therapy: Secondary | ICD-10-CM | POA: Diagnosis not present

## 2016-02-25 DIAGNOSIS — G4733 Obstructive sleep apnea (adult) (pediatric): Secondary | ICD-10-CM | POA: Diagnosis not present

## 2016-02-25 LAB — CBC
HCT: 43.1 % (ref 36.0–46.0)
Hemoglobin: 13.9 g/dL (ref 12.0–15.0)
MCH: 30 pg (ref 26.0–34.0)
MCHC: 32.3 g/dL (ref 30.0–36.0)
MCV: 93.1 fL (ref 78.0–100.0)
PLATELETS: 369 10*3/uL (ref 150–400)
RBC: 4.63 MIL/uL (ref 3.87–5.11)
RDW: 14.1 % (ref 11.5–15.5)
WBC: 7.6 10*3/uL (ref 4.0–10.5)

## 2016-02-25 LAB — TROPONIN I

## 2016-02-25 LAB — BASIC METABOLIC PANEL
ANION GAP: 9 (ref 5–15)
BUN: <5 mg/dL — ABNORMAL LOW (ref 6–20)
CALCIUM: 9 mg/dL (ref 8.9–10.3)
CO2: 28 mmol/L (ref 22–32)
Chloride: 103 mmol/L (ref 101–111)
Creatinine, Ser: 0.62 mg/dL (ref 0.44–1.00)
Glucose, Bld: 117 mg/dL — ABNORMAL HIGH (ref 65–99)
Potassium: 4 mmol/L (ref 3.5–5.1)
Sodium: 140 mmol/L (ref 135–145)

## 2016-02-25 LAB — TSH: TSH: 0.511 u[IU]/mL (ref 0.350–4.500)

## 2016-02-25 LAB — PROTIME-INR
INR: 2.84
PROTHROMBIN TIME: 30.4 s — AB (ref 11.4–15.2)

## 2016-02-25 LAB — BRAIN NATRIURETIC PEPTIDE: B Natriuretic Peptide: 258 pg/mL — ABNORMAL HIGH (ref 0.0–100.0)

## 2016-02-25 LAB — I-STAT TROPONIN, ED: Troponin i, poc: 0.01 ng/mL (ref 0.00–0.08)

## 2016-02-25 MED ORDER — ONDANSETRON HCL 4 MG/2ML IJ SOLN: 4.0000 mg | Freq: Four times a day (QID) | INTRAMUSCULAR | Status: DC | PRN

## 2016-02-25 MED ORDER — DILTIAZEM HCL ER 60 MG PO CP12
120.0000 mg | ORAL_CAPSULE | Freq: Two times a day (BID) | ORAL | Status: DC
  Administered 2016-02-25 – 2016-02-26 (×2): 120 mg via ORAL
  Filled 2016-02-25 (×2): qty 2

## 2016-02-25 MED ORDER — ATENOLOL 25 MG PO TABS
25.0000 mg | ORAL_TABLET | Freq: Every day | ORAL | Status: DC
  Administered 2016-02-26: 25 mg via ORAL
  Filled 2016-02-25: qty 1

## 2016-02-25 MED ORDER — SENNOSIDES-DOCUSATE SODIUM 8.6-50 MG PO TABS
1.0000 | ORAL_TABLET | Freq: Every day | ORAL | Status: DC
  Filled 2016-02-25: qty 1

## 2016-02-25 MED ORDER — BRINZOLAMIDE 1 % OP SUSP
1.0000 [drp] | Freq: Two times a day (BID) | OPHTHALMIC | Status: DC
  Administered 2016-02-25 – 2016-02-26 (×2): 1 [drp] via OPHTHALMIC
  Filled 2016-02-25: qty 10

## 2016-02-25 MED ORDER — AMLODIPINE BESYLATE 5 MG PO TABS: 5.0000 mg | ORAL_TABLET | Freq: Every day | ORAL | Status: DC

## 2016-02-25 MED ORDER — ONDANSETRON HCL 4 MG PO TABS: 4.0000 mg | ORAL_TABLET | Freq: Four times a day (QID) | ORAL | Status: DC | PRN

## 2016-02-25 MED ORDER — HYDROCODONE-ACETAMINOPHEN 10-325 MG PO TABS
1.0000 | ORAL_TABLET | Freq: Every day | ORAL | Status: DC
  Administered 2016-02-25 – 2016-02-26 (×2): 1 via ORAL
  Filled 2016-02-25 (×2): qty 1

## 2016-02-25 MED ORDER — BRIMONIDINE TARTRATE 0.2 % OP SOLN
1.0000 [drp] | Freq: Two times a day (BID) | OPHTHALMIC | Status: DC
  Administered 2016-02-25 – 2016-02-26 (×2): 1 [drp] via OPHTHALMIC
  Filled 2016-02-25: qty 5

## 2016-02-25 MED ORDER — ALBUTEROL SULFATE (2.5 MG/3ML) 0.083% IN NEBU: 2.5000 mg | INHALATION_SOLUTION | Freq: Four times a day (QID) | RESPIRATORY_TRACT | Status: DC | PRN

## 2016-02-25 MED ORDER — WARFARIN SODIUM 2.5 MG PO TABS
2.5000 mg | ORAL_TABLET | ORAL | Status: DC
  Filled 2016-02-25: qty 1

## 2016-02-25 MED ORDER — ACETAMINOPHEN 325 MG PO TABS: 650.0000 mg | ORAL_TABLET | Freq: Four times a day (QID) | ORAL | Status: DC | PRN

## 2016-02-25 MED ORDER — SODIUM CHLORIDE 0.9% FLUSH: 3.0000 mL | Freq: Two times a day (BID) | INTRAVENOUS | Status: DC

## 2016-02-25 MED ORDER — BRIMONIDINE TARTRATE-TIMOLOL 0.2-0.5 % OP SOLN: 1.0000 [drp] | Freq: Two times a day (BID) | OPHTHALMIC | Status: DC

## 2016-02-25 MED ORDER — ACETAMINOPHEN 650 MG RE SUPP: 650.0000 mg | Freq: Four times a day (QID) | RECTAL | Status: DC | PRN

## 2016-02-25 MED ORDER — WARFARIN - PHARMACIST DOSING INPATIENT: Freq: Every day | Status: DC

## 2016-02-25 MED ORDER — FUROSEMIDE 20 MG PO TABS
20.0000 mg | ORAL_TABLET | Freq: Every day | ORAL | Status: DC
  Administered 2016-02-26: 20 mg via ORAL
  Filled 2016-02-25 (×2): qty 1

## 2016-02-25 MED ORDER — WARFARIN SODIUM 2.5 MG PO TABS
1.2500 mg | ORAL_TABLET | ORAL | Status: DC
  Administered 2016-02-25: 1.25 mg via ORAL

## 2016-02-25 MED ORDER — TIMOLOL MALEATE 0.5 % OP SOLN
1.0000 [drp] | Freq: Two times a day (BID) | OPHTHALMIC | Status: DC
  Administered 2016-02-25 – 2016-02-26 (×2): 1 [drp] via OPHTHALMIC
  Filled 2016-02-25: qty 5

## 2016-02-25 NOTE — ED Triage Notes (Signed)
Pt. Coming from home via [facility] for chest pain and palpitations since 1am. Pt. Woke up to a fluttering feeling and took an atenolol. Pt. Hx of afib. Pt. Took another dose of atenolol at 7am with no relief. Pt. Then called EMS. Pt. sts her chest discomfort was a 2/10 and resolved with 2 nitro and 324 ASA en route. Pt. 12-lead sinus brady at 58. Pt. Also c/o SOB, weakness, and dizziness during the chest pain. Pt. On 3L oxygen via nasal cannula all the time at home.

## 2016-02-25 NOTE — ED Notes (Signed)
Admitting at bedside.

## 2016-02-25 NOTE — ED Notes (Signed)
Patient transported to X-ray

## 2016-02-25 NOTE — ED Notes (Signed)
Updated son at this time.

## 2016-02-25 NOTE — ED Provider Notes (Signed)
[facility] DEPT Provider Note   CSN: [id number] Arrival date & time: 02/25/16  4742  History   Chief Complaint Chief Complaint  Patient presents with  . Chest Pain    HPI Jacqueline Nguyen is a 82 y.o. female.  HPI  82 y.o. female with a hx of HTN, PE (On Coumadin), paroxysmal Afib, Obesity, 3L O2 [place] chronically, presents to the Emergency Department today complaining of chest pain. Pt arrived via EMS. Notes chest pain and palpitations since 1 AM at rest. Woke up with fluttering feeling in chest and took Atenolol. Took second dose of Atenolol around 7AM with minimal relief. Called EMS. Chest pain currently 2/10 with 2 nitro given via EMS. Given ASA as well. Pt notes shortness of breath currently with associated weakness. No N/V. No Diaphoresis. No numbness/tingling. On coumadin due to hx PE and has Afib. No other symptoms noted.    Past Medical History:  Diagnosis Date  . Arthritis   . Cataract   . Chronic respiratory failure ([place])   . Endometriosis   . HTN (hypertension)   . Hx of echocardiogram    Echo (8/15):  Mild LVH, EF 65%, no RWMA, Ao sclerosis, no AS, mod MAC, mild LAE, normal RVSF, PASP 40 mmHg  . Hx pulmonary embolism    on chronic coumadin  . Obesities, morbid ([place])   . OSA (obstructive sleep apnea)   . Osteoporosis   . PAF (paroxysmal atrial fibrillation) ([place])    Event Monitor (8/15):  Frequent PACs, brief bursts of nonsustained ATach; no AFib  . Psoriasis   . Skin cancer   . Vertigo     Patient Active Problem List   Diagnosis Date Noted  . Chronic right-sided CHF (congestive heart failure) 11/09/2015  . Physical deconditioning 11/09/2015  . DVT (deep venous thrombosis) ([place]) 12/28/2014  . Bronchitis, chronic obstructive, with exacerbation ([place]) 12/28/2014  . History of pulmonary embolism 02/24/2014  . Chronic respiratory failure ([place]) 11/26/2013  . Pulmonary hypertension 11/22/2013  . HTN (hypertension) 09/01/2013  . Blurred vision, right eye 08/29/2012    . Morbid obesity ([place])   . Blood clotting disorder ([place])   . Cataract   . Obstructive sleep apnea   . Arthritis   . Vertigo   . Anticoagulated on Coumadin   . PAF (paroxysmal atrial fibrillation) ([place]) 02/05/2012    Past Surgical History:  Procedure Laterality Date  . cataract surgery    . GALLBLADDER SURGERY    . NOSE SURGERY    . OOPHORECTOMY    . SKIN SURGERY    . UVULOPALATOPHARYNGOPLASTY      OB History    No data available       Home Medications    Prior to Admission medications   Medication Sig Start Date End Date Taking? Authorizing Provider  albuterol (PROVENTIL HFA;VENTOLIN HFA) 108 (90 BASE) MCG/ACT inhaler Inhale 2 puffs into the lungs every 6 (six) hours as needed for wheezing.    Historical Provider, MD  amLODipine (NORVASC) 5 MG tablet Take 5 mg by mouth daily.    Historical Provider, MD  Ascorbic Acid (VITAMIN C PO) Take 600 mg by mouth daily.    Historical Provider, MD  atenolol (TENORMIN) 25 MG tablet Take 12.5 mg by mouth continuous as needed.  02/01/15   Historical Provider, MD  AZOPT 1 % ophthalmic suspension Place 1 drop into both eyes 2 (two) times daily. 06/29/15   Historical Provider, MD  COMBIGAN 0.2-0.5 % ophthalmic solution Place 1 drop into both  eyes 2 (two) times daily. 07/01/15   Historical Provider, MD  diltiazem (CARDIZEM CD) 120 MG 24 hr capsule TAKE ONE (1) CAPSULE BY MOUTH 2 TIMES DAILY <PLEASE MAKE APPOINTMENT FOR REFILLS> 12/15/15   Mihai Croitoru, MD  fluticasone (FLOVENT HFA) 44 MCG/ACT inhaler Take 2 puffs by mouth 2 (two) times daily. 09/11/14   Historical Provider, MD  furosemide (LASIX) 20 MG tablet TAKE ONE (1) TABLET EACH DAY 01/10/16   Sanda Klein, MD  Homeopathic Products (ARNICARE ARNICA) CREA Apply 1 application topically daily.     Historical Provider, MD  HYDROcodone-acetaminophen (NORCO) 10-325 MG tablet Take 1 tablet by mouth daily. 02/01/15   Historical Provider, MD  levalbuterol Penne Lash) 0.63 MG/3ML nebulizer solution  Take 1 ampule by nebulization every 8 (eight) hours as needed for wheezing.    Historical Provider, MD  LORazepam (ATIVAN) 1 MG tablet TAKE ONE TABLET FOUR TIMES DAILY AS NEEDED 06/29/14   Historical Provider, MD  meclizine (ANTIVERT) 25 MG tablet TAKE ONE TABLET 2-3 TIMES A DAY AS NEEDED FOR VERTIGO 06/30/14   Historical Provider, MD  Multiple Minerals (MINERAL COMPLEX PO) Take 1 tablet by mouth daily.     Historical Provider, MD  NON FORMULARY Place 4 L into the nose at bedtime. 3 liter as needed during day depending on extertion    Historical Provider, MD  Nystatin ([place]) 100000 UNIT/GM POWD Apply topically.    Historical Provider, MD  OLIVE LEAF EXTRACT PO Take 450 mg by mouth daily.    Historical Provider, MD  Respiratory Therapy Supplies (FLUTTER) DEVI Blow through 4 times per set, three sets daily 12/28/14   Deneise Lever, MD  Turmeric 500 MG CAPS Take 500 mg by mouth daily as needed.    Historical Provider, MD  warfarin (COUMADIN) 2.5 MG tablet Take 2.5 mg by mouth as directed.  02/03/14   Historical Provider, MD    Family History Family History  Problem Relation Age of Onset  . Heart attack Father   . Heart attack Brother   . Stroke Neg Hx     Social History Social History  Substance Use Topics  . Smoking status: Former Smoker    Packs/day: 1.00    Years: 2.00    Types: Cigarettes    Quit date: 04/03/1981  . Smokeless tobacco: Never Used  . Alcohol use No     Allergies   Ciprofloxacin; Bactrim; and Codeine   Review of Systems Review of Systems ROS reviewed and all are negative for acute change except as noted in the HPI.  Physical Exam Updated Vital Signs Ht _0  (1.676 m)   Wt 111.1 kg   SpO2 97%   BMI 39.54 kg/m   Physical Exam  Constitutional: She is oriented to person, place, and time. Vital signs are normal. She appears well-developed and well-nourished.  3L O2 [place] (chronic)  HENT:  Head: Normocephalic.  Right Ear: Hearing normal.  Left Ear: Hearing  normal.  Eyes: Conjunctivae and EOM are normal. Pupils are equal, round, and reactive to light.  Neck: Normal range of motion. Neck supple.  Cardiovascular: Normal rate, regular rhythm, normal heart sounds and intact distal pulses.   Pulmonary/Chest: Effort normal and breath sounds normal.  Abdominal: Soft.  Musculoskeletal: Normal range of motion.  Neurological: She is alert and oriented to person, place, and time.  Skin: Skin is warm and dry.  Psychiatric: She has a normal mood and affect. Her speech is normal and behavior is normal. Thought content normal.  Nursing note and vitals reviewed.  ED Treatments / Results  Labs (all labs ordered are listed, but only abnormal results are displayed) Labs Reviewed  BASIC METABOLIC PANEL - Abnormal; Notable for the following:       Result Value   Glucose, Bld 117 (*)    BUN <5 (*)    All other components within normal limits  CBC  PROTIME-INR  I-STAT TROPOININ, ED    EKG  EKG Interpretation None       Radiology Dg Chest 2 View  Result Date: 02/25/2016 CLINICAL DATA:  Mid sternal chest pain and tightness EXAM: CHEST  2 VIEW COMPARISON:  11/26/2013 FINDINGS: Mild cardiomegaly and peribronchial thickening. No confluent opacified stat areas of atelectasis or scarring in the lung bases. No confluent opacities otherwise. No effusions or edema. No acute bony abnormality. IMPRESSION: Cardiomegaly. Mild bronchitic changes with bibasilar atelectasis or scarring. Electronically Signed   By: Rolm Baptise M.D.   On: 02/25/2016 11:12    Procedures Procedures (including critical care time)  Medications Ordered in ED Medications - No data to display   Initial Impression / Assessment and Plan / ED Course  I have reviewed the triage vital signs and the nursing notes.  Pertinent labs & imaging results that were available during my care of the patient were reviewed by me and considered in my medical decision making (see chart for  details).  Clinical Course as of Feb 24 1029  Fri Feb 25, 2016  1014 EKGNormal sinus rhythm rate 50Normal axis, normal intervals  [JK]    Clinical Course User Index [JK] Dorie Rank, MD    Final Clinical Impressions(s) / ED Diagnoses  {I have reviewed and evaluated the relevant laboratory values. {I have reviewed and evaluated the relevant imaging studies. {I have interpreted the relevant EKG. {I have reviewed the relevant previous healthcare records. {I have reviewed EMS Documentation. {I obtained HPI from historian. {Patient discussed with supervising physician.  ED Course:  Assessment: Pt is a 84yF hx HTN, PE (On Coumadin), paroxysmal Afib, Obesity, 3L O2 [place] chronically presents with CP this AM while at rest. No N/V. No diaphoresis. VSS, no tracheal deviation, no JVD or new murmur, RRR, breath sounds equal bilaterally, EKG without acute abnormalities, negative troponin, and negative CXR. Heart Score 5. No active chest pain currently. Discussed with supervising physician. Will admit to Medicine Observation   Disposition/Plan:  Admit Pt acknowledges and agrees with plan  Supervising Physician Dorie Rank, MD  Final diagnoses:  Chest pain, unspecified type    New Prescriptions New Prescriptions   No medications on file     Shary Decamp, PA-C 02/25/16 1156    Dorie Rank, MD 02/26/16 [phone]

## 2016-02-25 NOTE — ED Notes (Signed)
EDP at bedside.

## 2016-02-25 NOTE — Progress Notes (Signed)
ANTICOAGULATION CONSULT NOTE - Initial Consult  Pharmacy Consult for coumadin Indication: atrial fibrillation  Allergies  Allergen Reactions  . Ciprofloxacin   . Bactrim Rash  . Codeine Nausea Only    Patient Measurements: Height: _0  (167.6 cm) Weight: 230 lb (104.3 kg) IBW/kg (Calculated) : 59.3   Vital Signs: Temp: 97.8 F (36.6 C) (11/24 1344) Temp Source: Oral (11/24 1344) BP: 153/62 (11/24 1344) Pulse Rate: 56 (11/24 1344)  Labs:  Recent Labs  02/25/16 1038 02/25/16 1223  HGB 13.9  --   HCT 43.1  --   PLT 369  --   LABPROT  --  30.4*  INR  --  2.84  CREATININE 0.62  --     Estimated Creatinine Clearance: 66.2 mL/min (by C-G formula based on SCr of 0.62 mg/dL).   Medical History: Past Medical History:  Diagnosis Date  . Arthritis   . Cataract   . Chronic respiratory failure ([place])   . Endometriosis   . HTN (hypertension)   . Hx of echocardiogram    Echo (8/15):  Mild LVH, EF 65%, no RWMA, Ao sclerosis, no AS, mod MAC, mild LAE, normal RVSF, PASP 40 mmHg  . Hx pulmonary embolism    on chronic coumadin  . Obesities, morbid ([place])   . OSA (obstructive sleep apnea)   . Osteoporosis   . PAF (paroxysmal atrial fibrillation) ([place])    Event Monitor (8/15):  Frequent PACs, brief bursts of nonsustained ATach; no AFib  . Psoriasis   . Skin cancer   . Vertigo     Medications:  Prescriptions Prior to Admission  Medication Sig Dispense Refill Last Dose  . albuterol (PROVENTIL HFA;VENTOLIN HFA) 108 (90 BASE) MCG/ACT inhaler Inhale 2 puffs into the lungs every 6 (six) hours as needed for wheezing.   Taking  . amLODipine (NORVASC) 5 MG tablet Take 5 mg by mouth daily.     . Ascorbic Acid (VITAMIN C PO) Take 600 mg by mouth daily.   Taking  . atenolol (TENORMIN) 25 MG tablet Take 12.5 mg by mouth continuous as needed.    Taking  . AZOPT 1 % ophthalmic suspension Place 1 drop into both eyes 2 (two) times daily.   Taking  . COMBIGAN 0.2-0.5 % ophthalmic  solution Place 1 drop into both eyes 2 (two) times daily.   Taking  . diltiazem (CARDIZEM CD) 120 MG 24 hr capsule TAKE ONE (1) CAPSULE BY MOUTH 2 TIMES DAILY <PLEASE MAKE APPOINTMENT FOR REFILLS> 180 capsule 3 Taking  . fluticasone (FLOVENT HFA) 44 MCG/ACT inhaler Take 2 puffs by mouth 2 (two) times daily.   Taking  . furosemide (LASIX) 20 MG tablet TAKE ONE (1) TABLET EACH DAY 30 tablet 0 Taking  . Homeopathic Products (ARNICARE ARNICA) CREA Apply 1 application topically daily.    Taking  . HYDROcodone-acetaminophen (NORCO) 10-325 MG tablet Take 1 tablet by mouth daily.   Taking  . levalbuterol (XOPENEX) 0.63 MG/3ML nebulizer solution Take 1 ampule by nebulization every 8 (eight) hours as needed for wheezing.   Taking  . LORazepam (ATIVAN) 1 MG tablet TAKE ONE TABLET FOUR TIMES DAILY AS NEEDED   Taking  . meclizine (ANTIVERT) 25 MG tablet TAKE ONE TABLET 2-3 TIMES A DAY AS NEEDED FOR VERTIGO   Taking  . Multiple Minerals (MINERAL COMPLEX PO) Take 1 tablet by mouth daily.    Taking  . NON FORMULARY Place 4 L into the nose at bedtime. 3 liter as needed during day depending on  extertion   Taking  . Nystatin ([place]) 100000 UNIT/GM POWD Apply topically.   Taking  . OLIVE LEAF EXTRACT PO Take 450 mg by mouth daily.   Taking  . Respiratory Therapy Supplies (FLUTTER) DEVI Blow through 4 times per set, three sets daily 1 each 0 Taking  . Turmeric 500 MG CAPS Take 500 mg by mouth daily as needed.   Taking  . warfarin (COUMADIN) 2.5 MG tablet Take 2.5 mg by mouth as directed.    Taking   Scheduled:  . atenolol  25 mg Oral Daily  . brimonidine-timolol  1 drop Both Eyes BID  . brinzolamide  1 drop Both Eyes BID  . diltiazem  120 mg Oral BID  . furosemide  20 mg Oral Daily  . HYDROcodone-acetaminophen  1 tablet Oral Daily  . senna-docusate  1 tablet Oral QHS  . sodium chloride flush  3 mL Intravenous Q12H    Assessment: 82 yo female her with CP on coumadin PTA for afib and history of PE.  -INR=  2.84  Home coumadin dose: 2.36m alternating with 1.281m(last taken 2.73m46mn 11/23)  Goal of Therapy:  INR 2-3 Monitor platelets by anticoagulation protocol: Yes   Plan:  -Continue coumadin as taken at home -PT/INR daily  AndHildred Laserharm D 02/25/2016 2:48 PM

## 2016-02-25 NOTE — ED Provider Notes (Signed)
Pt is a 82 y.o. female who presents with  Chief Complaint  Patient presents with  . Chest Pain  Patient presented to the emergency room with complaints of chest pain. Patient also felt like her heart beat was irregular. She checked her heart rate at home with device and it indicated the heart rate was irregular. She tried taking some atenolol without relief. The symptoms initially started about 1 AM. EMS was called she was given nitroglycerin and aspirin. Her symptoms have resolved at this point. Patient does not have a history of coronary artery disease but does have known history of ablation.  Physical Exam  Constitutional: No distress.  Elderly, overweight  HENT:  Head: Normocephalic and atraumatic.  Eyes: Conjunctivae are normal. Left eye exhibits no discharge. No scleral icterus.  Neck: No tracheal deviation present. No thyromegaly present.  Cardiovascular: Normal rate and regular rhythm.   Pulmonary/Chest: Effort normal and breath sounds normal. No stridor. No respiratory distress. She has no wheezes.  Abdominal: She exhibits no distension.  Musculoskeletal: She exhibits no tenderness or deformity.  Neurological: She is alert.  Skin: Skin is warm. No rash noted. She is not diaphoretic. No erythema.  Psychiatric: Affect normal.    Clinical Course as of Feb 25 1039  Fri Feb 25, 2016  1014 EKGNormal sinus rhythm rate 50Normal axis, normal intervals  [JK]    Clinical Course User Index [JK] Dorie Rank, MD   Pt presents with chest pain and palpitations.  Heart score is 5.  Sx concerning for possible angina.  Will check cardiac enzymes and monitor.   Chest pain, unspecified type   Medical screening examination/treatment/procedure(s) were conducted as a shared visit with non-physician practitioner(s) and myself.  I personally evaluated the patient during the encounter.       Dorie Rank, MD 02/26/16 1001

## 2016-02-25 NOTE — Progress Notes (Signed)
02/25/2016 1330 Accepted to room 2W30 a pt from the ED with CP.  Pt is A&OX4.  No c/o pain at this time.  Tele monitor applied and CCMD notified.  Oriented to room, call light and bed.  Call bell in reach, family at bedside. [place]

## 2016-02-25 NOTE — H&P (Signed)
Date: 02/25/2016               Patient Name:  Jacqueline Nguyen MRN: [id number]  DOB: 03/10/1932 Age / Sex: 82 y.o., female   PCP: Ardith Dark, PA-C         Medical Service: Internal Medicine Teaching Service         Attending Physician: Dr. Gilles Chiquito    First Contact: Dr. Asencion Partridge Pager: [phone]  Second Contact: Dr. Burgess Estelle Pager: [phone]       After Hours (After 5p/  First Contact Pager: [phone]  weekends / holidays): Second Contact Pager: [phone]   Chief Complaint: Chest pain, palpitations  History of Present Illness: Jacqueline Nguyen is an 82 y.o. woman with PMH HTN, PE on Coumadin, PAF, obesity, OSA on CPAP, dCHF, chronic home oxygen who presents for palpitations and chest pain at home. She reports that over the past few months she has had intermittent palpitations with dizziness and fatigue that have become progressively more frequent. Last night at 1AM she awoke from sleep with her heart racing, feeling weak and dizzy, and chest pressure/tightness. She took a dose of her Atenolol but the palpitations persisted. She took a second dose around 7AM with minimal relief and called EMS. She received 2 nitro via EMS that helped her chest pressure and she received aspirin as well. She reports this has been the worse episode of palpitations she has had so far. Sometime since leaving home via EMS and arriving in the ED her palpitations ceased but she is still very anxious about this episode. She reports that her doctors have been increasing her rate control medications (Atenolol and Diltiazem) but she cannot tolerate higher doses due to hypotension and orthostatic symptoms. She denies SOB, diaphoresis, N/V, recent illness, or syncope.  In the ED - vitals afebrile, HR 50s, RR 15-20, BP 133/79, SpO2 100% on 2L [place]. EKG showed no acute abnormalities, initial troponin negative, BNP 258, and CXR unremarkable, heart score of 5. IMTS contacted and patient admitted for ACS rule out.  Meds:    Current Meds  Medication Sig  . atenolol (TENORMIN) 25 MG tablet Take 25 mg by mouth daily.   . AZOPT 1 % ophthalmic suspension Place 1 drop into both eyes 2 (two) times daily.  . budesonide-formoterol (SYMBICORT) 160-4.5 MCG/ACT inhaler Inhale 2 puffs into the lungs 2 (two) times daily.  . COMBIGAN 0.2-0.5 % ophthalmic solution Place 1 drop into both eyes 2 (two) times daily.  Marland Kitchen diltiazem (CARDIZEM CD) 120 MG 24 hr capsule TAKE ONE (1) CAPSULE BY MOUTH 2 TIMES DAILY <PLEASE MAKE APPOINTMENT FOR REFILLS>  . DM-Doxylamine-Acetaminophen (NYQUIL COLD & FLU PO) Take 10 mLs by mouth daily as needed (mucus).  . fluticasone (FLOVENT HFA) 44 MCG/ACT inhaler Take 2 puffs by mouth 2 (two) times daily.  . furosemide (LASIX) 20 MG tablet TAKE ONE (1) TABLET EACH DAY  . Homeopathic Products (ARNICARE ARNICA) CREA Apply 1 application topically daily.   Marland Kitchen HYDROcodone-acetaminophen (NORCO) 10-325 MG tablet Take 1 tablet by mouth daily.  Marland Kitchen levalbuterol (XOPENEX) 0.63 MG/3ML nebulizer solution Take 1 ampule by nebulization every 8 (eight) hours as needed for wheezing.  . meclizine (ANTIVERT) 25 MG tablet TAKE ONE TABLET 2-3 TIMES A DAY AS NEEDED FOR VERTIGO  . NON FORMULARY Place 4 L into the nose at bedtime. 3 liter as needed during day depending on extertion   . NON FORMULARY Apply 1 application topically daily as needed. [facility]  Herbal Heal Ointment for damaged skin  . NON FORMULARY Take 1-2 sprays by mouth daily as needed. Mouth Kote Dry Mouth Spray  . Nystatin (Ivy) 100000 UNIT/GM POWD Apply topically.  Marland Kitchen Respiratory Therapy Supplies (FLUTTER) DEVI Blow through 4 times per set, three sets daily  . vitamin C (ASCORBIC ACID) 500 MG tablet Take 500 mg by mouth daily.    Allergies: Allergies as of 02/25/2016 - Review Complete 02/25/2016  Allergen Reaction Noted  . Ciprofloxacin  04/11/2011  . Bactrim Rash 04/11/2011  . Codeine Nausea Only 04/11/2011   Past Medical History:  Diagnosis  Date  . Arthritis   . Cataract   . Chronic respiratory failure ([place])   . Endometriosis   . HTN (hypertension)   . Hx of echocardiogram    Echo (8/15):  Mild LVH, EF 65%, no RWMA, Ao sclerosis, no AS, mod MAC, mild LAE, normal RVSF, PASP 40 mmHg  . Hx pulmonary embolism    on chronic coumadin  . Obesities, morbid ([place])   . OSA (obstructive sleep apnea)   . Osteoporosis   . PAF (paroxysmal atrial fibrillation) ([place])    Event Monitor (8/15):  Frequent PACs, brief bursts of nonsustained ATach; no AFib  . Psoriasis   . Skin cancer   . Vertigo     Family History:  Family History  Problem Relation Age of Onset  . Heart attack Father   . Heart attack Brother   . Stroke Neg Hx     Social History:  Social History   Social History  . Marital status: Widowed    Spouse name: N/A  . Number of children: N/A  . Years of education: N/A   Occupational History  . Not on file.   Social History Main Topics  . Smoking status: Former Smoker    Packs/day: 1.00    Years: 2.00    Types: Cigarettes    Quit date: 04/03/1981  . Smokeless tobacco: Never Used  . Alcohol use No  . Drug use: No  . Sexual activity: Not Currently   Other Topics Concern  . Not on file   Social History Narrative   She lives with her sister in [place] and requires assistance at home. Has an aid who visits and helps at home often. Son works as a Catering manager and also visits and helps when he can. Semi-independent in ADLs, requires walker around home, dependent in IADLs.    Review of Systems: A complete ROS was negative except as per HPI.   Physical Exam: Blood pressure (!) 112/52, pulse (!) 48, temperature 98.7 F (37.1 C), temperature source Oral, resp. rate 15, height _0  (1.676 m), weight 111.1 kg (245 lb), SpO2 100 %.  General appearance: Obese elderly woman resting comfortably in bed, in no distress HENT: Normocephalic, atraumatic, moist mucous membranes, neck supple, no JVD Eyes: PERRL, EOM  inact, non-icteric Cardiovascular: Bradycardic rate and regular rhythm, soft systolic murmur, rubs, gallops Respiratory: Clear to auscultation bilaterally, normal work of breathing Abdomen: Obese, BS+, soft, non-tender, non-distended Extremities: Obese bulk and normal range of motion, compression stockings over BLEs, 2+ peripheral pulses Skin: Warm, dry, intact, thin fragile-appearing skin Neuro: Alert and oriented, cranial nerves grossly intact Psych: Appropriate affect, clear speech, thoughts linear and goal-directed   EKG: sinus bradycardia, low voltage  CXR: Mild bronchitic changes with bibasilar atelectasis or scarring.  Assessment & Plan by Problem: Jacqueline Nguyen is a 82 y.o. woman with PMH HTN, PE on Coumadin, PAF, obesity, OSA  on CPAP, dCHF, chronic home oxygen who presents following an episode of palpitations and CP this AM.  Active Problems:   Chest pain  Chest pain, no ST changes or signs of ischemia, resolved with resolution of palpitations. Improved with nitro and aspirin via EMS, heart score 5, CXR unremarkable, initial troponin negative - Telemetry - Serial troponins - AM EKG  PAF, on Coumadin, Atenolol, Diltiazem, currently sinus bradycardia in 40s-50s after taking 2x her home Atenolol 17m this morning, concern for increasingly frequent episodes over past months. - Telemetry - Coumadin per pharmacy - Diltiazem SR 120 BID - Atenolol 251mQD - Consider discussion with cardiology w/r/t risk/benefits/options for further afib control  Chronic diastolic HF, normal EF and grade 2 diastolic dyfunction on TTE on 02/22/2016 - Continue home Lasix 2042mD - Continue home TED compression stockings - Daily weights  Obesity, poor mobility at home - PT eval and treat  OSA on CPAP - CPAP QHS  Chronic hypoxic respiratory failure - Continue home O2 2-3 L [place], adjust to maintain O2 no greater than 99%  Unspecified eye condition, glaucoma (?) - Continue home brinzolamide,  timolol drops  Chronic MSK pain - Continue home Norco QD  FEN/GI: Regular diet, replete electrolytes as needed  DVT ppx: Warfarin  Code status: Full code  Dispo: Admit patient to Observation with expected length of stay less than 2 midnights.  Signed: AdaAsencion PartridgeD 02/25/2016, 11:57 AM  Pager: [phone]

## 2016-02-26 DIAGNOSIS — R002 Palpitations: Secondary | ICD-10-CM | POA: Diagnosis not present

## 2016-02-26 DIAGNOSIS — I480 Paroxysmal atrial fibrillation: Secondary | ICD-10-CM

## 2016-02-26 DIAGNOSIS — R079 Chest pain, unspecified: Secondary | ICD-10-CM | POA: Diagnosis not present

## 2016-02-26 DIAGNOSIS — J9611 Chronic respiratory failure with hypoxia: Secondary | ICD-10-CM

## 2016-02-26 DIAGNOSIS — I110 Hypertensive heart disease with heart failure: Secondary | ICD-10-CM | POA: Diagnosis not present

## 2016-02-26 DIAGNOSIS — R5381 Other malaise: Secondary | ICD-10-CM

## 2016-02-26 DIAGNOSIS — I5032 Chronic diastolic (congestive) heart failure: Secondary | ICD-10-CM | POA: Diagnosis not present

## 2016-02-26 LAB — COMPREHENSIVE METABOLIC PANEL
ALBUMIN: 3.1 g/dL — AB (ref 3.5–5.0)
ALT: 14 U/L (ref 14–54)
ANION GAP: 8 (ref 5–15)
AST: 20 U/L (ref 15–41)
Alkaline Phosphatase: 58 U/L (ref 38–126)
BUN: <5 mg/dL — ABNORMAL LOW (ref 6–20)
CO2: 28 mmol/L (ref 22–32)
Calcium: 8.8 mg/dL — ABNORMAL LOW (ref 8.9–10.3)
Chloride: 102 mmol/L (ref 101–111)
Creatinine, Ser: 0.59 mg/dL (ref 0.44–1.00)
GLUCOSE: 122 mg/dL — AB (ref 65–99)
POTASSIUM: 3.7 mmol/L (ref 3.5–5.1)
Sodium: 138 mmol/L (ref 135–145)
TOTAL PROTEIN: 6.2 g/dL — AB (ref 6.5–8.1)
Total Bilirubin: 0.7 mg/dL (ref 0.3–1.2)

## 2016-02-26 LAB — TROPONIN I

## 2016-02-26 LAB — PROTIME-INR
INR: 2.86
PROTHROMBIN TIME: 30.6 s — AB (ref 11.4–15.2)

## 2016-02-26 LAB — CBC
HEMATOCRIT: 41.6 % (ref 36.0–46.0)
Hemoglobin: 13.4 g/dL (ref 12.0–15.0)
MCH: 29.8 pg (ref 26.0–34.0)
MCHC: 32.2 g/dL (ref 30.0–36.0)
MCV: 92.7 fL (ref 78.0–100.0)
Platelets: 344 10*3/uL (ref 150–400)
RBC: 4.49 MIL/uL (ref 3.87–5.11)
RDW: 13.9 % (ref 11.5–15.5)
WBC: 8.1 10*3/uL (ref 4.0–10.5)

## 2016-02-26 MED ORDER — ATENOLOL 25 MG PO TABS: 25.0000 mg | ORAL_TABLET | Freq: Every day | ORAL | Status: DC | PRN

## 2016-02-26 MED ORDER — GLYCERIN (LAXATIVE) 2.1 G RE SUPP
1.0000 | Freq: Every day | RECTAL | Status: DC | PRN
  Filled 2016-02-26: qty 1

## 2016-02-26 MED ORDER — HYDROXYZINE HCL 25 MG PO TABS
25.0000 mg | ORAL_TABLET | Freq: Once | ORAL | Status: AC
  Administered 2016-02-26: 25 mg via ORAL
  Filled 2016-02-26: qty 1

## 2016-02-26 NOTE — Evaluation (Signed)
Physical Therapy Evaluation Patient Details Name: Jacqueline Nguyen MRN: [id number] DOB: 09/23/1931 Today's Date: 02/26/2016   History of Present Illness  Patient is an 82 yo female admitted 02/25/16 with chest pain, PAF, and bradycardia.    PMH:  HTN, PE, OSA on CPAP, CHF, PAF, anxiety, arthritis, obesity, vertigo    Clinical Impression  Patient presents with problems listed below.  Will benefit from acute PT to maximize functional mobility prior to discharge home.  Son uses w/c to move patient into/out of home.  Patient keeps [facility] next to her chair at home when needed.  Patient has Aide to assist with ADL's.  Recommend f/u HHPT for continued therapy for mobility/gait training at d/c.    Follow Up Recommendations Home health PT;Supervision for mobility/OOB    Equipment Recommendations  None recommended by PT    Recommendations for Other Services       Precautions / Restrictions Precautions Precautions: Fall Precaution Comments: H/o falls at home (2 in 6 months) Restrictions Weight Bearing Restrictions: No      Mobility  Bed Mobility Overal bed mobility: Modified Independent             General bed mobility comments: Increased time  Transfers Overall transfer level: Needs assistance Equipment used: Rolling walker (2 wheeled) Transfers: Sit to/from Stand Sit to Stand: Mod assist;Min assist         General transfer comment: Verbal cues for hand placement.  On first attempt, patient required mod assist to move to standing.  Patient stood x 60 seconds, stepping in place.  After seated rest, patient able to stand with min assist to steady and increased time.  Ambulation/Gait Ambulation/Gait assistance: Min assist Ambulation Distance (Feet): 12 Feet Assistive device: Rolling walker (2 wheeled) Gait Pattern/deviations: Step-through pattern;Decreased step length - right;Decreased step length - left;Decreased stride length;Shuffle;Trunk flexed Gait velocity: decreased Gait  velocity interpretation: Below normal speed for age/gender General Gait Details: Patient with slow gait speed, slightly unsteady even with RW.  Patient reports LE's feel "weak from being in bed".  Distance limited by fatigue.  Stairs            Wheelchair Mobility    Modified Rankin (Stroke Patients Only)       Balance Overall balance assessment: Needs assistance;History of Falls         Standing balance support: Bilateral upper extremity supported Standing balance-Leahy Scale: Poor                               Pertinent Vitals/Pain Pain Assessment: 0-10 Pain Score: 6  Pain Location: Neck, Back Pain Descriptors / Indicators: Aching;Sore Pain Intervention(s): Limited activity within patient's tolerance;Monitored during session;Repositioned    Home Living Family/patient expects to be discharged to:: Private residence Living Arrangements: Other relatives (sister) Available Help at Discharge: Family;Personal care attendant;Available 24 hours/day (sister 42*, son 4 days/week, Aide 7 days/week) Type of Home: House Home Access: Level entry     Home Layout: One level Home Equipment: Walker - 4 wheels;Bedside commode;Shower seat - built in;Wheelchair - manual      Prior Function Level of Independence: Independent with assistive device(s);Needs assistance   Gait / Transfers Assistance Needed: Patient uses rollator in house.  Uses w/c outside of home.  ADL's / Homemaking Assistance Needed: Aide assists patient with bathing/dressing, meal prep, housekeeping        Hand Dominance        Extremity/Trunk Assessment   Upper  Extremity Assessment: Generalized weakness           Lower Extremity Assessment: Generalized weakness (Strength grossly 4-/5)         Communication   Communication: No difficulties  Cognition Arousal/Alertness: Awake/alert Behavior During Therapy: WFL for tasks assessed/performed;Anxious Overall Cognitive Status: Within  Functional Limits for tasks assessed                      General Comments      Exercises     Assessment/Plan    PT Assessment Patient needs continued PT services  PT Problem List Decreased strength;Decreased activity tolerance;Decreased balance;Decreased mobility;Cardiopulmonary status limiting activity;Obesity;Pain          PT Treatment Interventions DME instruction;Gait training;Functional mobility training;Therapeutic activities;Therapeutic exercise;Balance training;Patient/family education    PT Goals (Current goals can be found in the Care Plan section)  Acute Rehab PT Goals Patient Stated Goal: To return home PT Goal Formulation: With patient/family Time For Goal Achievement: 03/04/16 Potential to Achieve Goals: Good    Frequency Min 3X/week   Barriers to discharge        Co-evaluation               End of Session Equipment Utilized During Treatment: Gait belt;Oxygen Activity Tolerance: Patient limited by pain;Patient limited by fatigue Patient left: in bed;with call bell/phone within reach;with family/visitor present (sitting EOB for lunch) Nurse Communication: Mobility status (Recommend HHPT)    Functional Assessment Tool Used: Clinical judgement Functional Limitation: Mobility: Walking and moving around Mobility: Walking and Moving Around Current Status (A2505): At least 1 percent but less than 20 percent impaired, limited or restricted Mobility: Walking and Moving Around Goal Status [phone]): At least 1 percent but less than 20 percent impaired, limited or restricted    Time: 1146-1212 PT Time Calculation (min) (ACUTE ONLY): 26 min   Charges:   PT Evaluation $PT Eval Moderate Complexity: 1 Procedure PT Treatments $Therapeutic Activity: 8-22 mins   PT G Codes:   PT G-Codes **NOT FOR INPATIENT CLASS** Functional Assessment Tool Used: Clinical judgement Functional Limitation: Mobility: Walking and moving around Mobility: Walking and  Moving Around Current Status (H4193): At least 1 percent but less than 20 percent impaired, limited or restricted Mobility: Walking and Moving Around Goal Status [phone]): At least 1 percent but less than 20 percent impaired, limited or restricted    Despina Pole 02/26/2016, 12:40 PM Carita Pian. Sanjuana Kava, [place] Pager [phone]

## 2016-02-26 NOTE — Progress Notes (Signed)
[place] for coumadin Indication: atrial fibrillation  Allergies  Allergen Reactions  . Ciprofloxacin   . Bactrim Rash  . Codeine Nausea Only    Patient Measurements: Height: 5' 6" (167.6 cm) Weight: 235 lb 12.8 oz (107 kg) IBW/kg (Calculated) : 59.3   Vital Signs: Temp: 97.7 F (36.5 C) (11/25 0608) Temp Source: Oral (11/25 0608) BP: 152/51 (11/25 0608) Pulse Rate: 61 (11/25 0608)  Labs:  Recent Labs  02/25/16 1038 02/25/16 1223 02/25/16 1441 02/25/16 1957 02/26/16 0148  HGB 13.9  --   --   --  13.4  HCT 43.1  --   --   --  41.6  PLT 369  --   --   --  344  LABPROT  --  30.4*  --   --  30.6*  INR  --  2.84  --   --  2.86  CREATININE 0.62  --   --   --  0.59  TROPONINI  --   --  <0.03 <0.03 <0.03    Estimated Creatinine Clearance: 67.1 mL/min (by C-G formula based on SCr of 0.59 mg/dL).    Scheduled:  . brimonidine  1 drop Both Eyes BID  . brinzolamide  1 drop Both Eyes BID  . diltiazem  120 mg Oral BID  . furosemide  20 mg Oral Daily  . HYDROcodone-acetaminophen  1 tablet Oral Daily  . senna-docusate  1 tablet Oral QHS  . sodium chloride flush  3 mL Intravenous Q12H  . timolol  1 drop Both Eyes BID  . warfarin  1.25 mg Oral Q48H  . warfarin  2.5 mg Oral Q48H  . Warfarin - Pharmacist Dosing Inpatient   Does not apply q1800    Assessment: 82 yo female her with CP on coumadin PTA for afib and history of PE.  -INR= 2.86  Home coumadin dose: 2.10m alternating with 1.242m(last taken 2.61m53mn 11/23)  Goal of Therapy:  INR 2-3 Monitor platelets by anticoagulation protocol: Yes   Plan:  -Continue coumadin as taken at home -PT/INR daily for now  AndHildred Laserharm D 02/26/2016 11:27 AM

## 2016-02-26 NOTE — Progress Notes (Signed)
Subjective: Jacqueline Nguyen is feeling well this morning but reports that she had a great deal of trouble getting comfortable in the hospital bed last night. Had difficulty falling asleep and eventually required a dose of Vistaril to get to sleep. She denies any return of palpitations or new complaints this morning. She remains very concerned about her atrial fibrillation and wants to find a way to get it under better control. She voiced some concerns about her struggles with anxiety and managing her anti-anxiety and pain medications with her PCP - and may choose to follow up in our clinic for these issues.   Review of telemetry showed persistent sinus bradycardia overnight with no episodes of afib.  Objective: Vital signs in last 24 hours: Vitals:   02/25/16 1300 02/25/16 1344 02/25/16 2116 02/26/16 0608  BP: 138/60 (!) 153/62 (!) 134/49 (!) 152/51  Pulse: (!) 55 (!) 56 (!) 58 61  Resp: _0 Temp:  97.8 F (36.6 C) 97.9 F (36.6 C) 97.7 F (36.5 C)  TempSrc:  Oral Oral Oral  SpO2: 98% 98% 94% 96%  Weight:  104.3 kg (230 lb)  107 kg (235 lb 12.8 oz)  Height:  _1  (1.676 m)      Intake/Output Summary (Last 24 hours) at 02/26/16 0846 Last data filed at 02/26/16 0837  Gross per 24 hour  Intake              120 ml  Output                0 ml  Net              120 ml    Physical Exam General appearance: Obese elderly woman resting comfortably in bed, in no distress HENT: Normocephalic, atraumatic, moist mucous membranes Cardiovascular: Bradycardic rate and regular rhythm, soft systolic murmur, rubs, gallops Respiratory: Clear to auscultation bilaterally, normal work of breathing Abdomen: Obese, BS+, soft, non-tender, non-distended Extremities: Obese bulk and normal range of motion, compression stockings over BLEs, 2+ peripheral pulses Skin: Warm, dry, intact, thin fragile-appearing skin Psych: Anxious affect, clear speech, thoughts linear and goal-directed  Labs / Imaging /  Procedures: CBC Latest Ref Rng & Units 02/26/2016 02/25/2016 02/03/2016  WBC 4.0 - 10.5 K/uL 8.1 7.6 7.0  Hemoglobin 12.0 - 15.0 g/dL 13.4 13.9 14.1  Hematocrit 36.0 - 46.0 % 41.6 43.1 44.4  Platelets 150 - 400 K/uL 344 369 338   BMP Latest Ref Rng & Units 02/26/2016 02/25/2016 02/03/2016  Glucose 65 - 99 mg/dL 122(H) 117(H) 124(H)  BUN 6 - 20 mg/dL <5(L) <5(L) 10  Creatinine 0.44 - 1.00 mg/dL 0.59 0.62 0.59  Sodium 135 - 145 mmol/L 138 140 141  Potassium 3.5 - 5.1 mmol/L 3.7 4.0 3.7  Chloride 101 - 111 mmol/L 102 103 103  CO2 22 - 32 mmol/L 28 28 32  Calcium 8.9 - 10.3 mg/dL 8.8(L) 9.0 9.3   Dg Chest 2 View  Result Date: 02/25/2016 CLINICAL DATA:  Mid sternal chest pain and tightness EXAM: CHEST  2 VIEW COMPARISON:  11/26/2013 FINDINGS: Mild cardiomegaly and peribronchial thickening. No confluent opacified stat areas of atelectasis or scarring in the lung bases. No confluent opacities otherwise. No effusions or edema. No acute bony abnormality. IMPRESSION: Cardiomegaly. Mild bronchitic changes with bibasilar atelectasis or scarring. Electronically Signed   By: Rolm Baptise M.D.   On: 02/25/2016 11:12    Assessment/Plan: Jacqueline Nguyen is a 82 y.o. woman with PMH HTN, PE  on Coumadin, PAF, obesity, OSA on CPAP, dCHF, chronic home oxygen who presents following an episode of palpitations and CP this AM.  Active Problems:   Chest pain  Chest pain, resolved since episode prior to admission, no ST changes or signs of ischemia, resolved with resolution of palpitations. Improved with nitro and aspirin via EMS, heart score 5, CXR unremarkable, initial troponin negative  - Telemetry unremarkable overnight - Serial troponins negative x3 - AM EKG sinus brady  PAF, on Coumadin, Atenolol, Diltiazem, currently sinus bradycardia in 40s-50s after taking 2x her home Atenolol 72m morning of admission, concern for increasingly frequent episodes over past months. Patient also with significant anxiety,  although she reports that her palpitations tend to precede and provoke her anxiety rather than vice versa.  - Coumadin per pharmacy - Diltiazem SR 120 BID - Atenolol 24mQD PRN - Discussed with cardiology who agree that she is stable for outpatient follow up, has appointment on 12/4, continue current medication regimen but could benefit from addition of anti-arrhythmic such as Amio or Digoxin, as well as possible monitor to catch/record/confirm these palpitation episodes  Chronic diastolic HF, normal EF and grade 2 diastolic dyfunction on TTE on 02/22/2016 - Continue home Lasix 2015mD - Continue home TED compression stockings - Daily weights  Obesity, poor mobility at home - PT eval and treat today, imminent discharge - May require HH PT, awaiting recs  OSA on CPAP - CPAP QHS  Chronic hypoxic respiratory failure - Continue home O2 2-3 L [place] prn with activity  Unspecified eye condition, glaucoma (?) - Continue home brinzolamide, timolol drops  Chronic back pain - Continue home Norco QD  FEN/GI: HH diet, replete electrolytes as needed  DVT ppx: Warfarin  Dispo: Anticipated discharge today.   LOS: 0 days   AdaAsencion PartridgeD 02/26/2016, 8:46 AM Pager: [phone]

## 2016-02-26 NOTE — Progress Notes (Signed)
  Date: 02/26/2016  Patient name: Jacqueline Nguyen  Medical record number: [id number]  Date of birth: [birth year]   I have seen and evaluated Jacqueline Nguyen and discussed their care with the Residency Team. Briefly, Jacqueline Nguyen is an 82yo woman with PMH of HTN, PE On coumadin, OSA on CPAP, dCHF, paroxysmal Afib who presented for palpitations and chest pain.  She had prolonged palpitations at home for which she takes atenolol PRN.  The symptoms awoke her from sleep and she took 1 dose of 25 mg.  Symptoms persisted in the AM And she took another dose.  She then called EMS and received aspirin and nitro which helped as well.   She converted to NSR.  She now has no chest pain or palpitations.  She is on diltiazem chronically and takes her atenolol PRN.  She reports issues with dizziness and low blood pressure at home.  She also has a long history of anxiety treated with benzodiazepines and her palpitations also make her anxious.  Today, she has no further chest pain, no dizziness, no passing out, no palpitations.  She is very concerned about going back in to Afib.   Soc Hx : Former smoker  Vitals:   02/25/16 2116 02/26/16 0608  BP: (!) 134/49 (!) 152/51  Pulse: (!) 58 61  Resp: 18 20  Temp: 97.9 F (36.6 C) 97.7 F (36.5 C)   Physical Exam Gen: Elderly woman, lying in bed Eyes: Anicteric sclerae, wearing glasses HENT: NCAT CV: Bradycardic, NR, systolic murmur Pulm: CTAB, no wheezing Abd: Obese, NT Ext: Compression stockings to knees bilaterally Skin: Warm, dry, intact Psych: Anxious   CXR: Chronic basilar scarring  TnI X 3 negative  BNP 258  INR 2.86  Assessment and Plan: I have seen and evaluated the patient as outlined above. I agree with the formulated Assessment and Plan as detailed in the residents' note, with the following changes:   1. Paroxysmal Afib - She is having increased episodes of palpitations, this most recent lasting for a prolonged time - She takes diltiazem and atenolol, but  has only been using the latter PRN - Reviewed last cardiology note, TTE done as they had planned, follow up planned in December - Given recent orthostasis and worsening episodes of palpitations, I wonder if a rhythm controlling medication such as amiodarone or digoxin may be appropriate.   - Cardiology will be contacted, though they may wish to see her in the outpatient setting as previously scheduled  2. Chronic dCHF - Complicated case based on last Cardiology note - Would contact team to let them know she is here and possibly they could come by to see her - Not fluid overloaded today on exam - Continue home medications  3. Chest pain - Resolved, TnI X 3 negative  Other issues as noted in Jacqueline Nguyen daily note.   [name], MD 11/25/20179:10 AM

## 2016-02-26 NOTE — Progress Notes (Signed)
Discharged to home with family office visits in place teaching done

## 2016-02-26 NOTE — Discharge Summary (Signed)
Name: Jacqueline Nguyen MRN: [id number] DOB: Oct 31, 1931 82 y.o. PCP: Ardith Dark, PA-C  Date of Admission: 02/25/2016  9:50 AM Date of Discharge: 02/26/2016 Attending Physician: [name], MD  Discharge Diagnosis: 1. Palpitations  Active Problems:   PAF (paroxysmal atrial fibrillation) (HCC)   Morbid obesity (HCC)   Chronic respiratory failure (HCC)   Physical deconditioning   Chest pain   Discharge Medications:   Medication List    TAKE these medications   ARNICARE ARNICA Crea Apply 1 application topically daily.   atenolol 25 MG tablet Commonly known as:  TENORMIN Take 25 mg by mouth daily.   AZOPT 1 % ophthalmic suspension Generic drug:  brinzolamide Place 1 drop into both eyes 2 (two) times daily.   budesonide-formoterol 160-4.5 MCG/ACT inhaler Commonly known as:  SYMBICORT Inhale 2 puffs into the lungs 2 (two) times daily.   COMBIGAN 0.2-0.5 % ophthalmic solution Generic drug:  brimonidine-timolol Place 1 drop into both eyes 2 (two) times daily.   diltiazem 120 MG 24 hr capsule Commonly known as:  CARDIZEM CD TAKE ONE (1) CAPSULE BY MOUTH 2 TIMES DAILY <PLEASE MAKE APPOINTMENT FOR REFILLS>   FLOVENT HFA 44 MCG/ACT inhaler Generic drug:  fluticasone Take 2 puffs by mouth 2 (two) times daily.   FLUTTER Devi Blow through 4 times per set, three sets daily   furosemide 20 MG tablet Commonly known as:  LASIX TAKE ONE (1) TABLET EACH DAY   HYDROcodone-acetaminophen 10-325 MG tablet Commonly known as:  NORCO Take 1 tablet by mouth daily.   levalbuterol 0.63 MG/3ML nebulizer solution Commonly known as:  XOPENEX Take 1 ampule by nebulization every 8 (eight) hours as needed for wheezing.   meclizine 25 MG tablet Commonly known as:  ANTIVERT TAKE ONE TABLET 2-3 TIMES A DAY AS NEEDED FOR VERTIGO   NON FORMULARY Apply 1 application topically daily as needed. [facility] Herbal Heal Ointment for damaged skin   NON FORMULARY Take 1-2  sprays by mouth daily as needed. Mouth Kote Dry Mouth Spray   NON FORMULARY Place 4 L into the nose at bedtime. 3 liter as needed during day depending on extertion   [facility] powder Generic drug:  nystatin Apply topically.   NYQUIL COLD & FLU PO Take 10 mLs by mouth daily as needed (mucus).   vitamin C 500 MG tablet Commonly known as:  ASCORBIC ACID Take 500 mg by mouth daily.   warfarin 2.5 MG tablet Commonly known as:  COUMADIN Take 1.25-2.5 mg by mouth as directed. Alternate 1.73m and 1.212mdaily       Disposition and follow-up:   [facility]  At the hospital follow up visit please address:  1.  Palpitations - likely PAF but presented in sinus bradycardia after taking Atenolol x2 - assess frequency, duration, severity of palpitation episodes - consider adding rhythm-control medication to her medication regimen  Obesity and poor functional status - assess weight loss, progress with home health PT, and mobility  Anxiety - assess symptom control  2.  Labs / imaging needed at time of follow-up: None  3.  Pending labs/ test needing follow-up: None  Follow-up Appointments: Follow-up Information    Barrett, RhSuanne MarkerPA-C. Go on 03/06/2016.   Specialties:  Cardiology, Radiology Why:  At 10:30 AM, please arrive 15 minutes early to check in. Contact information: [address] [place] [phone]  Gilles Chiquito, MD. Schedule an appointment as soon as possible for a visit.   Specialty:  Internal Medicine Why:    Contact information: [place] [address] [place] Follow up.   Why:  Home health physical therapy Contact information: [address] [place] [address] [phone]           Hospital Course by problem list: Active Problems:   PAF (paroxysmal atrial fibrillation) (HCC)   Morbid  obesity (HCC)   Chronic respiratory failure (HCC)   Physical deconditioning   Chest pain   1. Palpitations Jacqueline Nguyen is a 82 y.o. woman with PMH HTN, PE on Coumadin, PAF, obesity, OSA on CPAP, dCHF, chronic home oxygen who presented on 11/24 for palpitations and chest pain at home. She reported that over the past few months she has had intermittent palpitations that have become progressively more frequent. She was awoken from sleep at 1am with palpitations and chest painand took a dose of her Atenolol, no relief by 7am and took another and called EMS to come to the hospital. In the ED she was in sinus bradycardia on EKG with HR in the 50s, CXR unremarkable, symptoms had resolved but remained very anxious. She was admitted for ACS rule out, serial troponins were negative and repeat EKG was unchanged. She remained in sinus brady with no symptoms overnight. Her case was discussed with cardiology who deemed her current regimen adequate, planned to defer any changes until outpatient appointment in early December. She was stable for discharge on 11/25.  2. Morbid obesity - with physical deconditioning, patient voiced concern on admission about her poor functional status and difficulty with mobility at home. PT eval on 11/25 recommended home health PT and this was arranged for her prior to discharge.  Discharge Vitals:   BP (!) 152/51 (BP Location: Left Arm)   Pulse 61   Temp 97.7 F (36.5 C) (Oral)   Resp 20   Ht _0  (1.676 m)   Wt 107 kg (235 lb 12.8 oz)   SpO2 96%   BMI 38.06 kg/m   Pertinent Labs, Studies, and Procedures:  BMP Latest Ref Rng & Units 02/26/2016 02/25/2016 02/03/2016  Glucose 65 - 99 mg/dL 122(H) 117(H) 124(H)  BUN 6 - 20 mg/dL <5(L) <5(L) 10  Creatinine 0.44 - 1.00 mg/dL 0.59 0.62 0.59  Sodium 135 - 145 mmol/L 138 140 141  Potassium 3.5 - 5.1 mmol/L 3.7 4.0 3.7  Chloride 101 - 111 mmol/L 102 103 103  CO2 22 - 32 mmol/L 28 28 32  Calcium 8.9 - 10.3 mg/dL 8.8(L) 9.0 9.3       Dg Chest 2 View  Result Date: 02/25/2016 CLINICAL DATA:  Mid sternal chest pain and tightness EXAM: CHEST  2 VIEW COMPARISON:  11/26/2013 FINDINGS: Mild cardiomegaly and peribronchial thickening. No confluent opacified stat areas of atelectasis or scarring in the lung bases. No confluent opacities otherwise. No effusions or edema. No acute bony abnormality. IMPRESSION: Cardiomegaly. Mild bronchitic changes with bibasilar atelectasis or scarring. Electronically Signed   By: Rolm Baptise M.D.   On: 02/25/2016 11:12     Discharge Instructions: Discharge Instructions    Call MD for:  difficulty breathing, headache or visual disturbances    Complete by:  As directed    Call MD for:  extreme fatigue    Complete by:  As directed    Call MD for:  persistant dizziness  or light-headedness    Complete by:  As directed    Call MD for:  persistant nausea and vomiting    Complete by:  As directed    Diet - low sodium heart healthy    Complete by:  As directed    Discharge instructions    Complete by:  As directed    Please continue taking your medications as prescribed. We have not adjusted your medications. We discussed your situation with cardiology and you are safe to continue taking Cardizem twice daily and Atenolol as needed for palpitation as you were previously.   Make sure to attend your cardiology follow up visit in approximately 1 week on 12/4 to discuss potential addition of a rhythm control medication (such as Amiodarone or Digoxin) to your regimen as well as outpatient heart monitor to record/confirm these episodes of palpitations.  We have also provided you with the contact information for our clinic to schedule a visit with Dr. Daryll Drown.  Our physical therapists have recommended for you to have physical therapy visit you at home - our case manager will help you set this up before leaving the hospital.   If you develop any new serious symptoms such as severe chest pain/pressure,  persistent shortness of breath, or severe dizziness / passing out please visit the nearest ER.   Increase activity slowly    Complete by:  As directed       Signed: Asencion Partridge, MD 02/27/2016, 5:05 PM   Pager: [phone]

## 2016-02-26 NOTE — Care Management Note (Addendum)
Case Management Note  Patient Details  Name: Jacqueline Nguyen MRN: [id number] Date of Birth: June 16, 1931  Subjective/Objective:                  chest pain, PAF, and bradycardia.  Action/Plan: CM spoke to patient who said that she chose [facility]) for [place] called Tommi Emery with [facility] who accepted referral. She has an aide that comes in daily to help with ADLs and has a RW and BSC already so declined any other DME needs. Patient lives at home with her sister and no other CM needs identified.   Expected Discharge Date:  02/26/16              Expected Discharge Plan:  [place]  In-House Referral:     Discharge planning Services  CM Consult  Post Acute Care Choice:  Home Health Choice offered to:  Patient  DME Arranged:    DME Agency:     HH Arranged:  PT [place]:  [facility] (now [facility])  Status of Service:  Completed, signed off  If discussed at [employer] of Stay Meetings, dates discussed:    Additional Comments:  Guido Sander, RN 02/26/2016, 1:52 PM

## 2016-02-27 ENCOUNTER — Telehealth: Payer: Self-pay | Admitting: Cardiology

## 2016-02-27 ENCOUNTER — Encounter: Payer: Self-pay | Admitting: Internal Medicine

## 2016-02-27 NOTE — Telephone Encounter (Signed)
Pt called stating she accidentally took an extra dose of her Cardizem today. Took dose at 9am and then another at 11am. Blood pressure one hour later was 146/72, HR 60. She denied any weakness, or dizziness. I instructed her to check her blood pressure and HR periodically for low readings. Instructed to come the ED if she becomes symptomatic.

## 2016-02-28 ENCOUNTER — Other Ambulatory Visit: Payer: Self-pay | Admitting: Cardiovascular Disease

## 2016-02-28 DIAGNOSIS — I10 Essential (primary) hypertension: Secondary | ICD-10-CM

## 2016-02-28 DIAGNOSIS — R0602 Shortness of breath: Secondary | ICD-10-CM

## 2016-02-28 NOTE — Telephone Encounter (Signed)
REFILL 

## 2016-03-03 ENCOUNTER — Encounter: Payer: Self-pay | Admitting: Internal Medicine

## 2016-03-03 ENCOUNTER — Ambulatory Visit (INDEPENDENT_AMBULATORY_CARE_PROVIDER_SITE_OTHER): Payer: Medicare Other | Admitting: Internal Medicine

## 2016-03-03 VITALS — BP 128/72 | HR 50

## 2016-03-03 DIAGNOSIS — Z79899 Other long term (current) drug therapy: Secondary | ICD-10-CM

## 2016-03-03 DIAGNOSIS — G4733 Obstructive sleep apnea (adult) (pediatric): Secondary | ICD-10-CM

## 2016-03-03 DIAGNOSIS — J9611 Chronic respiratory failure with hypoxia: Secondary | ICD-10-CM

## 2016-03-03 DIAGNOSIS — Z87891 Personal history of nicotine dependence: Secondary | ICD-10-CM

## 2016-03-03 DIAGNOSIS — I480 Paroxysmal atrial fibrillation: Secondary | ICD-10-CM

## 2016-03-03 NOTE — Progress Notes (Signed)
F former smoker followed for OSA, Chronic resp failure, complicated by PAF, PHTN/ coumadin, morbid obesity   Office Spirometry 11/26/13- severe restriction w FVC 1.43/ 49%, FEV1 0.95/ 45%, FEV1/FVC 0.66, FEF25-75% 0.52/33%, but poor technique with delayed exhalation so this may not be best capability.     12/28/14-83 yo F former smoker followed for OSA, Chronic resp failure, complicated by PAF, PHTN/ coumadin, morbid obesity   Caregiver [name] is here, son is here CPAP 9/ [facility] every night with O2 4lpm FOLLOWS FOR wears CPAP every night with 4lpm. States feeling rested in the morning. Still has palpatations and racing heart in the morning  She still notices intermittent palpitations and they may wake her in the morning. She describes increased mucus production with morning cough and difficulty clearing her chest. Refuses flu vaccine "makes me sick". Rescue inhaler 2 or 3 times daily Concerned about nontender knot in posterior left calf. She continues warfarin and says most recent INR was 4.4, pending contact with Coumadin clinic but without bleeding.  03/03/2016-82 year old female former smoker followed for OSA, Chronic respiratory failure, complicated by PAF, PTH GEN/Coumadin, morbid obesity CPAP 9/[facility] with O2 4 L FOLLOWS FOR:  pt denies any complaints with cpap machine at this time.  Noticing more frequent episodes of A. fib, worse in the mornings so she questions relationship to her CPAP. Otherwise CPAP seems to be doing fine but we have no download this time. She says she couldn't sleep without it. CXR 02/25/2016 IMPRESSION: Cardiomegaly. Mild bronchitic changes with bibasilar atelectasis or scarring.  ROS-see HPI Constitutional:   No-   weight loss, night sweats, fevers, chills, fatigue, lassitude. HEENT:   No-  headaches, difficulty swallowing, tooth/dental problems, sore throat,       No-  sneezing, itching, ear ache, nasal congestion, post nasal drip,  CV:  No-   chest pain,  orthopnea, PND, +swelling in lower extremities, anasarca,                                                                      dizziness, +palpitations Resp: +shortness of breath with exertion or at rest.              +   productive cough,  No non-productive cough,  No- coughing up of blood.              No-   change in color of mucus.  No- wheezing.   Skin: No-   rash or lesions. GI:  No-   heartburn, indigestion, abdominal pain, nausea, vomiting,  GU:  MS:  No-   joint pain or swelling.  . Neuro-     nothing unusual Psych:  No- change in mood or affect. No depression or anxiety.  No memory loss.  OBJ- Physical Exam   Talkative, + morbidly obese General- Alert, Oriented, Affect-appropriate, Distress- none acute,  walker,  Skin- rash-none, lesions- none, excoriation- none Lymphadenopathy- none Head- atraumatic            Eyes- Gross vision intact, PERRLA, conjunctivae and secretions clear            Ears- Hearing, canals-normal            Nose- Clear, no-Septal dev, mucus, polyps, erosion, perforation  Throat- Mallampati II , mucosa clear , drainage- none, tonsils- atrophic Neck- flexible , trachea midline, no stridor , thyroid nl, carotid no bruit Chest - symmetrical excursion , unlabored           Heart/CV- RRR , no murmur , no gallop  , no rub, nl s1 s2                           - JVD- none , edema+1-2, stasis changes- none, varices- none           Lung- clear to P&A, wheeze- none, cough- none , dullness-none, rub- none + seems comfortable sitting on                           room air           Chest wall-  Abd-  Br/ Gen/ Rectal- Not done, not indicated Extrem- cyanosis- none, clubbing, none, atrophy- none, strength- nl, +elastic hose, + wheelchair, Homans-Negative Neuro- grossly intact to observation

## 2016-03-03 NOTE — Assessment & Plan Note (Signed)
She will keep cardiology follow-up scheduled next week. She feels comfortable with CPAP we need download to see if there could be improvement. Not sure there is a true connection.

## 2016-03-03 NOTE — Assessment & Plan Note (Signed)
Chronic respiratory failure with hypoxia. She seems comfortable sitting now with her oxygen off as we talk but she sleeps with it on 4 L and keeps it on most of the time at home.

## 2016-03-03 NOTE — Patient Instructions (Signed)
Order- DME [facility]- We can continue CPAP 9 with O2 4L, mask of choice, humidifier, supplies, AirView if available                                   Please send current pressure compliance download for documentation  Please call if we can help

## 2016-03-03 NOTE — Progress Notes (Signed)
   CC: Paroxysmal A. fib  HPI:  [name] is a 82 y.o. with has past medical history as outlined below who presents to clinic for hospital follow-up. She was hospitalized for one day on November 24 for palpitations. Please see problem list for further details of her chronic medical issues.    Past Medical History:  Diagnosis Date  . Arthritis   . Cataract   . Chronic respiratory failure ([place])   . Endometriosis   . HTN (hypertension)   . Hx of echocardiogram    Echo (8/15):  Mild LVH, EF 65%, no RWMA, Ao sclerosis, no AS, mod MAC, mild LAE, normal RVSF, PASP 40 mmHg  . Hx pulmonary embolism    on chronic coumadin  . Obesities, morbid ([place])   . OSA (obstructive sleep apnea)   . Osteoporosis   . PAF (paroxysmal atrial fibrillation) ([place])    Event Monitor (8/15):  Frequent PACs, brief bursts of nonsustained ATach; no AFib  . Psoriasis   . Skin cancer   . Vertigo     Review of Systems:  Denies dizziness and orthostatic hypotension. Denies current medications.  Physical Exam:  Vitals:   03/03/16 1412  BP: (!) 143/49  Pulse: (!) 57  Temp: 97.3 F (36.3 C)  TempSrc: Oral  SpO2: 97%  Weight: 240 lb 8 oz (109.1 kg)  Height: _0  (1.676 m)   Physical Exam  Constitutional: She is oriented to person, place, and time. She appears well-developed and well-nourished. No distress.  Morbidly obese  HENT:  Head: Normocephalic and atraumatic.  Nose: Nose normal.  Cardiovascular: Regular rhythm and normal heart sounds.  Exam reveals no gallop and no friction rub.   No murmur heard. Bradycardic  Pulmonary/Chest: Effort normal and breath sounds normal. No respiratory distress. She has no wheezes. She has no rales.  Abdominal: Soft. Bowel sounds are normal. She exhibits no distension and no mass. There is no tenderness. There is no rebound and no guarding.  Neurological: She is alert and oriented to person, place, and time.  Skin: Skin is warm and dry. No rash noted. She is not  diaphoretic. No erythema. No pallor.   Assessment & Plan:   See Encounters Tab for problem based charting.  Patient discussed with Dr. Evette Doffing

## 2016-03-03 NOTE — Assessment & Plan Note (Addendum)
Patient was hospitalized on November 24 for palpitations. She is discharged the following day without any changes to her current medical regimen. She follows with cardiology and will see them this coming Monday. She is currently on atenolol and diltiazem for A. fib. On EPIC her atenolol dose is 25 mg once a day however her medicine bottles that she had filled by her PCP - Dr. Gwendel Hanson is  for 4 times a day. Patient reports taking it when necessary. Today she had a 2 hour episode of A. fib with heart rate up to 160s. She took her morning dose of atenolol 25 mg and then an additional dose with resolution of A. fib. Yesterday she took 3 tablets of atenolol, the day prior to tabs, and the day prior to the 3. She keeps a log of her blood pressure readings in the low systolic reading is 299. Her heart rate today is 57. She denies any symptoms of orthostatic hypotension or dizziness.  - Advised patient that she may continue her current atenolol when necessary regimen as she does not have frequent episodes of A. Fib. -Follow up with cardiology on Monday

## 2016-03-03 NOTE — Assessment & Plan Note (Signed)
She describes good compliance with CPAP and comfortable at pressure of 9. Plan-download needed

## 2016-03-03 NOTE — Patient Instructions (Signed)
You can continue taking atenolol as directed but do not exceed more than 4 times a day.

## 2016-03-06 ENCOUNTER — Ambulatory Visit (INDEPENDENT_AMBULATORY_CARE_PROVIDER_SITE_OTHER): Payer: Medicare Other | Admitting: Physician Assistant

## 2016-03-06 ENCOUNTER — Telehealth: Payer: Self-pay | Admitting: Internal Medicine

## 2016-03-06 ENCOUNTER — Encounter: Payer: Self-pay | Admitting: Physician Assistant

## 2016-03-06 VITALS — BP 138/64 | HR 60 | Ht 65.0 in | Wt 230.0 lb

## 2016-03-06 DIAGNOSIS — Z7901 Long term (current) use of anticoagulants: Secondary | ICD-10-CM | POA: Diagnosis not present

## 2016-03-06 DIAGNOSIS — T50905A Adverse effect of unspecified drugs, medicaments and biological substances, initial encounter: Secondary | ICD-10-CM | POA: Diagnosis not present

## 2016-03-06 DIAGNOSIS — I480 Paroxysmal atrial fibrillation: Secondary | ICD-10-CM | POA: Diagnosis not present

## 2016-03-06 DIAGNOSIS — R001 Bradycardia, unspecified: Secondary | ICD-10-CM

## 2016-03-06 NOTE — Telephone Encounter (Signed)
I think routed to me in error

## 2016-03-06 NOTE — Patient Instructions (Addendum)
You have palpitations. You are at risk for Atrial Fibrillation On your event monitor in 2015 we saw Atrial Tachycardia but no Atrial Fibrillation. In 2017, we saw some slightly slow heart rates and occasional skipped beats, but no Atrial Fib or Atrial Tachycardia  Medication Instructions:  TAKE- Atenolol at Bed Time  Labwork: None Ordered  Testing/Procedures: None Ordered  Follow-Up: Your physician recommends that you schedule a follow-up appointment in: Dr Sallyanne Kuster in February   Any Other Special Instructions Will Be Listed Below (If Applicable).  It is ok to take benadryl at bedtime   If you need a refill on your cardiac medications before your next appointment, please call your pharmacy.

## 2016-03-06 NOTE — Telephone Encounter (Signed)
Spoke with [facility] and they stated that the order that was sent in last year to change the pressure of her cpap to 9 was declined by the pt and she wanted to stay on auto.  They have documentation that BQ was aware of this.  BQ please advise if we need to change the setting and we will let [facility] know.  thanks

## 2016-03-06 NOTE — Progress Notes (Signed)
Cardiology Office Note   Date:  03/06/2016   ID:  [name], DOB 02-26-1932, MRN [id number]  PCP:  Sheral Apley  Cardiologist:  Dr Juanetta Snow, PA-C   Chief Complaint  Patient presents with  . Follow-up    1 month followp; per patient she has been having a lot of afib     History of Present Illness: Jacqueline Nguyen is a 82 y.o. female with a history of chronic resp failure on home O2, OSA on CPAP, PAF on atenolol & Cardizem & coumadin, HTN, nl EF by echo 2015, PE, morbid obesity  Admit 11/24-11/25 for palpitations, ?PAF but not captured.  12/01 IM office visit, pt had more palpitations w/ HR 160.  Jacqueline Nguyen presents for cardiology follow up.   Ms Dewalt had noticed heart racing every morning. It would wake her. She is compliant w/ CPAP. She started taking the atenolol in the evening and those symptoms improved. Her Atenolol bottles instruct her to take it 4 x day, but she only takes it once.   Once she started taking the atenolol qhs, the palpitations stopped. Her heart rate is currently in the 50s, she is asymptomatic with this. It has been in the 40s, she has only seen this once.   She has not had chest pain. She has chronic DOE, no change. She has had mild daytime LE edema, no change. She does not wake with edema.    Past Medical History:  Diagnosis Date  . Arthritis   . Cataract   . Chronic respiratory failure ([place])   . Endometriosis   . HTN (hypertension)   . Hx of echocardiogram    Echo (8/15):  Mild LVH, EF 65%, no RWMA, Ao sclerosis, no AS, mod MAC, mild LAE, normal RVSF, PASP 40 mmHg  . Hx pulmonary embolism    on chronic coumadin  . Obesities, morbid ([place])   . OSA (obstructive sleep apnea)   . Osteoporosis   . PAF (paroxysmal atrial fibrillation) ([place])    Event Monitor (8/15):  Frequent PACs, brief bursts of nonsustained ATach; no AFib  . Psoriasis   . Skin cancer   . Vertigo     Past Surgical History:  Procedure Laterality Date   . cataract surgery    . GALLBLADDER SURGERY    . NOSE SURGERY    . OOPHORECTOMY    . SKIN SURGERY    . UVULOPALATOPHARYNGOPLASTY      Current Outpatient Prescriptions  Medication Sig Dispense Refill  . atenolol (TENORMIN) 25 MG tablet Take 25 mg by mouth daily.     . AZOPT 1 % ophthalmic suspension Place 1 drop into both eyes 2 (two) times daily.    . budesonide-formoterol (SYMBICORT) 160-4.5 MCG/ACT inhaler Inhale 2 puffs into the lungs 2 (two) times daily.    . COMBIGAN 0.2-0.5 % ophthalmic solution Place 1 drop into both eyes 2 (two) times daily.    . cyanocobalamin 1000 MCG tablet Take 1,000 mcg by mouth daily.    Marland Kitchen diltiazem (CARDIZEM CD) 120 MG 24 hr capsule TAKE ONE (1) CAPSULE BY MOUTH 2 TIMES DAILY <PLEASE MAKE APPOINTMENT FOR REFILLS> 180 capsule 3  . DM-Doxylamine-Acetaminophen (NYQUIL COLD & FLU PO) Take 10 mLs by mouth daily as needed (mucus).    . fluticasone (FLOVENT HFA) 44 MCG/ACT inhaler Take 2 puffs by mouth 2 (two) times daily.    . furosemide (LASIX) 20 MG tablet Take 1 tablet (20 mg total) by mouth daily. KEEP  OV. 30 tablet 0  . Homeopathic Products (ARNICARE ARNICA) CREA Apply 1 application topically daily.     Marland Kitchen HYDROcodone-acetaminophen (NORCO) 10-325 MG tablet Take 1 tablet by mouth daily.    Marland Kitchen levalbuterol (XOPENEX) 0.63 MG/3ML nebulizer solution Take 1 ampule by nebulization every 8 (eight) hours as needed for wheezing.    . meclizine (ANTIVERT) 25 MG tablet TAKE ONE TABLET 2-3 TIMES A DAY AS NEEDED FOR VERTIGO    . Multiple Vitamin (MULTIVITAMIN WITH MINERALS) TABS tablet Take 1 tablet by mouth daily.    . NON FORMULARY Place 4 L into the nose at bedtime. 3 liter as needed during day depending on extertion     . NON FORMULARY Apply 1 application topically daily as needed. [facility] Herbal Heal Ointment for damaged skin    . NON FORMULARY Take 1-2 sprays by mouth daily as needed. Mouth Kote Dry Mouth Spray    . Nystatin ([place]) 100000 UNIT/GM POWD  Apply topically.    Marland Kitchen Respiratory Therapy Supplies (FLUTTER) DEVI Blow through 4 times per set, three sets daily 1 each 0  . warfarin (COUMADIN) 2.5 MG tablet Take 1.25-2.5 mg by mouth as directed. Alternate 1.26m and 1.218mdaily     No current facility-administered medications for this visit.     Allergies:   Ciprofloxacin; Bactrim; and Codeine    Social History:  The patient  reports that she quit smoking about 34 years ago. Her smoking use included Cigarettes. She has a 2.00 pack-year smoking history. She has never used smokeless tobacco. She reports that she does not drink alcohol or use drugs.   Family History:  The patient's family history includes Heart attack in her brother and father.    ROS:  Please see the history of present illness. All other systems are reviewed and negative.    PHYSICAL EXAM: VS:  BP 138/64 (BP Location: Left Arm, Patient Position: Sitting, Cuff Size: Large)   Pulse 60   Ht _0  (1.651 m)   Wt 230 lb (104.3 kg)   BMI 38.27 kg/m  , BMI Body mass index is 38.27 kg/m. GEN: Well nourished, well developed, female in no acute distress  HEENT: normal for age  Neck: no JVD, no carotid bruit, no masses Cardiac: RRR; soft murmur, no rubs, or gallops Respiratory:  clear to auscultation bilaterally, normal work of breathing GI: soft, nontender, nondistended, + BS MS: no deformity or atrophy; trace edema; distal pulses are 2+ in all 4 extremities   Skin: warm and dry, no rash Neuro:  Strength and sensation are intact Psych: euthymic mood, full affect   EKG:  EKG is ordered today. The ekg ordered today demonstrates Sinus brady, HR 51, low voltage w/ lateral leads smaller than previous.   Recent Labs: 02/25/2016: B Natriuretic Peptide 258.0; TSH 0.511 02/26/2016: ALT 14; BUN <5; Creatinine, Ser 0.59; Hemoglobin 13.4; Platelets 344; Potassium 3.7; Sodium 138    Lipid Panel    Component Value Date/Time   CHOL 205 (H) 08/30/2012 0550   TRIG 102  08/30/2012 0550   HDL 55 08/30/2012 0550   CHOLHDL 3.7 08/30/2012 0550   VLDL 20 08/30/2012 0550   LDLCALC 130 (H) 08/30/2012 0550     Wt Readings from Last 3 Encounters:  03/06/16 230 lb (104.3 kg)  03/03/16 240 lb 8 oz (109.1 kg)  02/26/16 235 lb 12.8 oz (107 kg)     Other studies Reviewed: Additional studies/ records that were reviewed today include: office notes hospital  records and testing.  ASSESSMENT AND PLAN:  1.  PAF: She got improvement in her symptoms by changing the atenolol to bedtime, continue this.  I discussed with her that we had never documented atrial fib, only atrial tach. I reassured her that her dizziness a year ago was not associated with an abnormal heart rate (per the event monitor). If she feels her heart skip, that is ok because she had PVCs.  The prolonged palpitations may be atrial fib, but believe we should try rate control first. If this is not successful, document the arrhythmia with another monitor and do appropriate treatment  2. Bradycardia: pt with resting bradycardia. Pt advised that HR 50s is fine, she is asymptomatic with it today. Because of her heart skips, she may have an artificially low HR at times. If she is asymptomatic, do not worry. If she feels bad call us. No increase in her BB/CCB because of the bradycardia. Her atenolol is prescribed at 1 tab 4 x day. She is advised that she should only take it once daily.  3. Chronic anticoag: hx PE and arrhythmia, continue coumadin.   Current medicines are reviewed at length with the patient today.  The patient has concerns regarding medicines. Concerns were addressed  The following changes have been made:  no change  Labs/ tests ordered today include:   Orders Placed This Encounter  Procedures  . EKG 12-Lead     Disposition:   FU with Dr Sallyanne Kuster  Signed, Rosaria Ferries, PA-C  03/06/2016 4:07 PM    [place] Phone: [phone]; Fax: [phone]  This  note was written with the assistance of speech recognition software. Please excuse any transcriptional errors.

## 2016-03-06 NOTE — Progress Notes (Signed)
Always worried MCr

## 2016-03-06 NOTE — Progress Notes (Signed)
Internal Medicine Clinic Attending  Case discussed with Dr. Hulen Luster at the time of the visit.  We reviewed the resident's history and exam and pertinent patient test results.  I agree with the assessment, diagnosis, and plan of care documented in the resident's note.

## 2016-03-07 ENCOUNTER — Telehealth: Payer: Self-pay

## 2016-03-07 ENCOUNTER — Telehealth: Payer: Self-pay | Admitting: Physician Assistant

## 2016-03-07 NOTE — Telephone Encounter (Signed)
Ok to let her continue with her current CPAP pressure as requested

## 2016-03-07 NOTE — Telephone Encounter (Signed)
Anderson Malta from [facility] requesting VO. Please call back.

## 2016-03-07 NOTE — Telephone Encounter (Signed)
Pt said she saw Rosaria Ferries yesterday,she says she have some questions.

## 2016-03-07 NOTE — Telephone Encounter (Signed)
Tell her there is currently no indication for an event monitor. Her symptoms improved when the atenolol was taken at night, continue this.   In February, it will be well over a year since her last monitor. If she is still having significant palpitations, we can order one then. Thankds

## 2016-03-07 NOTE — Telephone Encounter (Signed)
This is a CY pt. I will route message to CY to make him aware.

## 2016-03-07 NOTE — Telephone Encounter (Signed)
Pt is using [facility] [place]

## 2016-03-07 NOTE — Telephone Encounter (Signed)
Spoke with [place] at [place], aware of cy's recs.  Nothing further needed.

## 2016-03-07 NOTE — Telephone Encounter (Signed)
Pt of Dr. Sallyanne Kuster Seen by Suanne Marker on 12/4  Spoke to patient. Regarding appt yesterday, notes she was given recommendation to start atenolol back QHS to see if this alleviated palpitations in AM. Aware that last event monitor findings in 2015 showed runs of atrial tach, no a fib.  Pt asking - wanted to make sure no indication to wear an event monitor. She is willing to try the atenolol to see if this helps, but wanted to make sure provider knew her palpitations also occur at other times of day.  Informed patient I would route to provider for review.

## 2016-03-08 NOTE — Telephone Encounter (Signed)
Recommendations discussed w patient who voiced understanding and thanks.

## 2016-03-23 ENCOUNTER — Ambulatory Visit (HOSPITAL_COMMUNITY)
Admission: RE | Admit: 2016-03-23 | Discharge: 2016-03-23 | Disposition: A | Discharge status: Home / Self Care | Payer: Medicare Other | Source: Ambulatory Visit | Attending: Internal Medicine | Admitting: Internal Medicine

## 2016-03-23 ENCOUNTER — Encounter: Payer: Self-pay | Admitting: Internal Medicine

## 2016-03-23 ENCOUNTER — Ambulatory Visit (INDEPENDENT_AMBULATORY_CARE_PROVIDER_SITE_OTHER): Payer: Medicare Other | Admitting: Internal Medicine

## 2016-03-23 VITALS — BP 132/43 | HR 58 | Temp 98.2°F | Ht 65.0 in | Wt 245.9 lb

## 2016-03-23 DIAGNOSIS — R202 Paresthesia of skin: Secondary | ICD-10-CM | POA: Diagnosis present

## 2016-03-23 DIAGNOSIS — Z79891 Long term (current) use of opiate analgesic: Secondary | ICD-10-CM

## 2016-03-23 DIAGNOSIS — Z8249 Family history of ischemic heart disease and other diseases of the circulatory system: Secondary | ICD-10-CM

## 2016-03-23 DIAGNOSIS — M1610 Unilateral primary osteoarthritis, unspecified hip: Secondary | ICD-10-CM

## 2016-03-23 DIAGNOSIS — Z86718 Personal history of other venous thrombosis and embolism: Secondary | ICD-10-CM

## 2016-03-23 DIAGNOSIS — M2578 Osteophyte, vertebrae: Secondary | ICD-10-CM | POA: Insufficient documentation

## 2016-03-23 DIAGNOSIS — J441 Chronic obstructive pulmonary disease with (acute) exacerbation: Secondary | ICD-10-CM

## 2016-03-23 DIAGNOSIS — Z808 Family history of malignant neoplasm of other organs or systems: Secondary | ICD-10-CM

## 2016-03-23 DIAGNOSIS — Z87891 Personal history of nicotine dependence: Secondary | ICD-10-CM

## 2016-03-23 DIAGNOSIS — J449 Chronic obstructive pulmonary disease, unspecified: Secondary | ICD-10-CM | POA: Diagnosis present

## 2016-03-23 DIAGNOSIS — Z9981 Dependence on supplemental oxygen: Secondary | ICD-10-CM

## 2016-03-23 DIAGNOSIS — Z90722 Acquired absence of ovaries, bilateral: Secondary | ICD-10-CM

## 2016-03-23 DIAGNOSIS — G894 Chronic pain syndrome: Secondary | ICD-10-CM

## 2016-03-23 DIAGNOSIS — M5030 Other cervical disc degeneration, unspecified cervical region: Secondary | ICD-10-CM | POA: Diagnosis not present

## 2016-03-23 DIAGNOSIS — R200 Anesthesia of skin: Secondary | ICD-10-CM | POA: Diagnosis not present

## 2016-03-23 DIAGNOSIS — Z881 Allergy status to other antibiotic agents status: Secondary | ICD-10-CM

## 2016-03-23 DIAGNOSIS — I110 Hypertensive heart disease with heart failure: Secondary | ICD-10-CM

## 2016-03-23 DIAGNOSIS — J9611 Chronic respiratory failure with hypoxia: Secondary | ICD-10-CM | POA: Diagnosis not present

## 2016-03-23 DIAGNOSIS — Z9049 Acquired absence of other specified parts of digestive tract: Secondary | ICD-10-CM

## 2016-03-23 DIAGNOSIS — Z9989 Dependence on other enabling machines and devices: Secondary | ICD-10-CM

## 2016-03-23 DIAGNOSIS — Z885 Allergy status to narcotic agent status: Secondary | ICD-10-CM

## 2016-03-23 DIAGNOSIS — Z6841 Body Mass Index (BMI) 40.0 and over, adult: Secondary | ICD-10-CM

## 2016-03-23 DIAGNOSIS — Z7901 Long term (current) use of anticoagulants: Secondary | ICD-10-CM

## 2016-03-23 DIAGNOSIS — R42 Dizziness and giddiness: Secondary | ICD-10-CM

## 2016-03-23 DIAGNOSIS — I50812 Chronic right heart failure: Secondary | ICD-10-CM

## 2016-03-23 DIAGNOSIS — Z79899 Other long term (current) drug therapy: Secondary | ICD-10-CM

## 2016-03-23 DIAGNOSIS — I517 Cardiomegaly: Secondary | ICD-10-CM | POA: Diagnosis not present

## 2016-03-23 DIAGNOSIS — I10 Essential (primary) hypertension: Secondary | ICD-10-CM

## 2016-03-23 DIAGNOSIS — Z8781 Personal history of (healed) traumatic fracture: Secondary | ICD-10-CM

## 2016-03-23 DIAGNOSIS — I82402 Acute embolism and thrombosis of unspecified deep veins of left lower extremity: Secondary | ICD-10-CM

## 2016-03-23 DIAGNOSIS — Z86711 Personal history of pulmonary embolism: Secondary | ICD-10-CM

## 2016-03-23 DIAGNOSIS — I5032 Chronic diastolic (congestive) heart failure: Secondary | ICD-10-CM

## 2016-03-23 MED ORDER — HYDROCODONE-ACETAMINOPHEN 10-325 MG PO TABS: 1.0000 | ORAL_TABLET | Freq: Three times a day (TID) | ORAL | 0 refills | Status: DC | PRN

## 2016-03-23 NOTE — Patient Instructions (Signed)
Jacqueline Nguyen - -   It was a pleasure to meet you today.   For your cough and sputum production, I will be getting a chest xray. I will call you if it is abnormal.   For your hand numbness/tingling, please get a neck xray.   For your pain in your hip, please continue hydrocodone as you have been taking.    Please come back to see me in 3 months.

## 2016-03-23 NOTE — Progress Notes (Signed)
Subjective:    Patient ID: Jacqueline Nguyen, female    DOB: 28-Jun-1931, 82 y.o.   MRN: [id number]  CC: New visit to establish care, left sided neck soreness.   HPI   [name] is an 82yo woman with PMH of atrial fibrillation/atrial tachycardia, HTN, h/o PE and DVT on coumadin, grade 2 diastolic dysfunction (TTE in 11/21 also with PAP of 39, reported previously to have pHTN), COPD, arthritis with chronic pain, depression and anxiety with sleepin difficulty, osteoporosis.  I had the opportunity to meet her in the hospital and she requested to change PCP to me.   She follows with Cardiology for her arrhythmia.  I reviewed their last note from 03/06/16.  Apparently, she never had true documented Afib and only documented A tach with possible PVCs.  Her symptoms occur every morning with palpitations, but she is taking her atenolol at night and this has improved.  Cardiology plans to continue rate control and see if this helps.  If palpitations do not improve, consider further EP evaluation.  Jacqueline Nguyen reports that her palpitations are improved on atenolol 63m QHS.  She has no chest pain. She has chronic DOE and uses oxygen, likely related to her lung disease.    Her chart reports COPD, she denies this diagnosis.  I reviewed last chart by Dr. [facility].  That group is mainly treating her for OSA and chronic respiratory failure with hypoxia.  She is on oxygen at night with CPAP which she reports taking and oxygen during the day if needed.  She was wearing oxygen when I saw her and doing well.    The main symptom she reported to me today was tingling in her hands and feet.  She notes that the tingling in her feet will usually be unilateral, associated with a crawling sensation and occurs mostly at night.  She thinks that she sleeps on her leg wrong and this is the cause.  It normally resolves and she does not have it at night or during the day.  Her hand tingling is in her fingertips and is worse in  the morning but doesn't resolve completely.  She denies and decreased warmth in her hands, wounds, change in color.  She has chronic arthritis.  She does have neck pain, particularly on the left worse with lying in certain positions.  She does not have a dx of DM.    She further notes URI type symptoms that are slowly improving.  She would like a CXR to make sure she doesn't have pneumonia.    PMH Atrial tachycardia Diastolic CHF HTN H/O dvt/PE on coumadin OSA, chronic respiratory failure on O2 Arthritis, hip pain Osteoporosis  PSH TAH due to endometriosis Oopherectomy Cholecystectomy Broken wrist fixation on the left wrist.   Family and Social History Father with Afib and heart disease Mother with bone cancer  Medications - we reviewed her list Allergies as of 03/23/2016      Reactions   Ciprofloxacin    Bactrim Rash   Codeine Nausea Only      Medication List       Accurate as of 03/23/16 11:59 PM. Always use your most recent med list.          ARNICARE ARNICA Crea Apply 1 application topically daily.   atenolol 25 MG tablet Commonly known as:  TENORMIN Take 25 mg by mouth daily.   AZOPT 1 % ophthalmic suspension Generic drug:  brinzolamide Place 1 drop into both  eyes 2 (two) times daily.   budesonide-formoterol 160-4.5 MCG/ACT inhaler Commonly known as:  SYMBICORT Inhale 2 puffs into the lungs 2 (two) times daily.   COMBIGAN 0.2-0.5 % ophthalmic solution Generic drug:  brimonidine-timolol Place 1 drop into both eyes 2 (two) times daily.   cyanocobalamin 1000 MCG tablet Take 1,000 mcg by mouth daily.   diltiazem 120 MG 24 hr capsule Commonly known as:  CARDIZEM CD TAKE ONE (1) CAPSULE BY MOUTH 2 TIMES DAILY <PLEASE MAKE APPOINTMENT FOR REFILLS>   FLOVENT HFA 44 MCG/ACT inhaler Generic drug:  fluticasone Take 2 puffs by mouth 2 (two) times daily.   FLUTTER Devi Blow through 4 times per set, three sets daily   furosemide 20 MG tablet Commonly  known as:  LASIX Take 1 tablet (20 mg total) by mouth daily. KEEP OV.   HYDROcodone-acetaminophen 10-325 MG tablet Commonly known as:  NORCO Take 1 tablet by mouth 3 (three) times daily as needed for severe pain.   levalbuterol 0.63 MG/3ML nebulizer solution Commonly known as:  XOPENEX Take 1 ampule by nebulization every 8 (eight) hours as needed for wheezing.   meclizine 25 MG tablet Commonly known as:  ANTIVERT TAKE ONE TABLET 2-3 TIMES A DAY AS NEEDED FOR VERTIGO   multivitamin with minerals Tabs tablet Take 1 tablet by mouth daily.   NON FORMULARY Apply 1 application topically daily as needed. [facility] Herbal Heal Ointment for damaged skin   NON FORMULARY Take 1-2 sprays by mouth daily as needed. Mouth Kote Dry Mouth Spray   NON FORMULARY Place 4 L into the nose at bedtime. 3 liter as needed during day depending on extertion   [facility] powder Generic drug:  nystatin Apply topically.   NYQUIL COLD & FLU PO Take 10 mLs by mouth daily as needed (mucus).   warfarin 2.5 MG tablet Commonly known as:  COUMADIN Take 1.25-2.5 mg by mouth as directed. Alternate 1.20m and 1.267mdaily       Review of Systems  Constitutional: Negative for activity change and fatigue.  HENT: Positive for congestion. Negative for sore throat, trouble swallowing and voice change.   Eyes: Negative for photophobia and visual disturbance.  Respiratory: Positive for cough and shortness of breath.   Cardiovascular: Negative for chest pain and leg swelling.  Gastrointestinal: Negative for constipation, nausea and vomiting.  Genitourinary: Negative for difficulty urinating and dysuria.  Musculoskeletal: Positive for arthralgias, gait problem and neck pain.  Skin: Negative for rash and wound.  Neurological: Positive for numbness (and tingling of hands). Negative for dizziness, syncope and weakness.  Psychiatric/Behavioral: Negative for decreased concentration and dysphoric mood.         Objective:   Physical Exam  Constitutional: She is oriented to person, place, and time.  Elderly woman, sitting in wheelchair  HENT:  Head: Normocephalic and atraumatic.  Eyes: Conjunctivae are normal. No scleral icterus.  Cardiovascular: Normal rate, regular rhythm and normal heart sounds.   No murmur heard. Pulmonary/Chest: Effort normal and breath sounds normal. No respiratory distress. She has no wheezes.  Some rales in left base which cleared with coughing  Abdominal: Soft. There is no tenderness.  Musculoskeletal: She exhibits no edema or tenderness.  Neurological: She is alert and oriented to person, place, and time. She exhibits normal muscle tone.  Skin: Skin is warm and dry.  Very thin skin, some senile bruising  Psychiatric: She has a normal mood and affect. Her behavior is normal.    Neck Xray, CXR today  UDS for establishment of narcotic prescription.        Assessment & Plan:  RTC in 3 months.

## 2016-03-24 ENCOUNTER — Telehealth: Payer: Self-pay | Admitting: Internal Medicine

## 2016-03-24 NOTE — Assessment & Plan Note (Signed)
She has a history of DVT and PE per her.  She is taking coumadin.  Goal INR is 2-3.  She uses an over the phone service with INRs on Mondays called in to the PCP office.  She plans to transition that care to here. I will need to discuss with our pharmacists if this is possible.    Plan Continue coumadin.  Last INR in our system was 2.8 which is at goal.

## 2016-03-24 NOTE — Assessment & Plan Note (Deleted)
She mainly has arthritis of the hip.  This limits her movements, ability to get out of the house.  She further uses the pain medication to be able to enjoy time with her children.  She has had withdrawal before when she was taken off the medication.  She takes hydrocodone-apap 10/325 about 3 times a day and sometimes she will take the 4th tablet.  She gets #120 a month.

## 2016-03-24 NOTE — Assessment & Plan Note (Signed)
She has a BMI of 40  Discuss seeing a nutritionist at next visit.

## 2016-03-24 NOTE — Assessment & Plan Note (Signed)
She is doing well with home oxygen and CPAP.   Continue oxygen.

## 2016-03-24 NOTE — Assessment & Plan Note (Signed)
BP today 132/43.  She is not dizzy, has no headache or chest pain.  She takes her diltiazem and atenolol regularly.  She states that sometimes her BP will get lower and she will skip her diltiazem.   Plan Controlled, continue current therapy.  Last renal function stable.

## 2016-03-24 NOTE — Assessment & Plan Note (Signed)
She is on chronic coumadin.  Last INR at goal.  She reports having PE, last CTA of chest did not show PE.   Continue coumadin for now.

## 2016-03-24 NOTE — Assessment & Plan Note (Addendum)
Today will plan to check xray of the neck.  She has known osteoarthritis and this may be related to cervical spinal stenosis.    Plan Xray neck.  If negative, will check some blood work for further evaluation She would be interested in evaluation for injectable therapy if this would help.

## 2016-03-24 NOTE — Assessment & Plan Note (Signed)
She notes that she does not think she has this.  She is not on any inhalers.   She has URI symptoms for a few weeks now, but improving.   CXR today.

## 2016-03-24 NOTE — Assessment & Plan Note (Signed)
She has intermittent vertigo for which she takes PRN meclizine.  She hs not needed it for a while.    Plan Continue meclizine

## 2016-03-24 NOTE — Telephone Encounter (Signed)
Attempted to call patient X2 regarding xray results.  Will attempt again next week.

## 2016-03-24 NOTE — Assessment & Plan Note (Signed)
Based on review of last TTE this year, she has chronic diastolic HF.  She follows with cardiology.  She is doing well, she takes lasix which helps with any swelling she has.  She has no JVD or weight gain today.  She uses chronic oxygen.   Plan Continue to monitor Follow up with Cardiology Continue atenolol and diltiazem

## 2016-03-24 NOTE — Assessment & Plan Note (Signed)
She mainly has arthritis of the hip.  This limits her movements, ability to get out of the house.  She further uses the pain medication to be able to enjoy time with her children.  She has had withdrawal before when she was taken off the medication.  She takes hydrocodone-apap 10/325 about 3 times a day and sometimes she will take the 4th tablet.  She gets #120 a month.   Today, she had a UDS done, she took her medication this morning. She will sign a contract at next visit.   Her MME is 30-40 per day and I think this is reasonable for someone with documented arthritis (shoulder CT scan reviewed with degenerative disease, neck xray with degenerative disease)  She is doing well at home, no excessive sedation or constipation.   Plan 3 refills of hydrocodone-apap provided Follow up UDS [place] narcotic database reviewed and refill history appropriate for 12 months.

## 2016-04-04 ENCOUNTER — Other Ambulatory Visit: Payer: Self-pay | Admitting: Cardiovascular Disease

## 2016-04-04 ENCOUNTER — Other Ambulatory Visit: Payer: Self-pay | Admitting: *Deleted

## 2016-04-04 DIAGNOSIS — R0602 Shortness of breath: Secondary | ICD-10-CM

## 2016-04-04 DIAGNOSIS — I10 Essential (primary) hypertension: Secondary | ICD-10-CM

## 2016-04-04 NOTE — Telephone Encounter (Signed)
Rx has been sent to the pharmacy electronically.

## 2016-04-06 ENCOUNTER — Telehealth: Payer: Self-pay | Admitting: Cardiovascular Disease

## 2016-04-06 NOTE — Telephone Encounter (Signed)
New Message  Pt c/o swelling: STAT is pt has developed SOB within 24 hours  1. How long have you been experiencing swelling? 3 weeks  2. Where is the swelling located? legs  3.  Are you currently taking a "fluid pill"?yes  4.  Are you currently SOB? Yes   5.  Have you traveled recently? no

## 2016-04-06 NOTE — Telephone Encounter (Signed)
Returned call to patient.She stated she is having swelling in both lower legs for the past 3 week.Stated she is sob, but no worse.Weight stable.Stated she has had a stomach virus and has been eating soup.Advised to decrease sodium in diet.Elevate legs. Advised to increase lasix to 40 mg daily for 3 days and then return to normal dose 20 mg daily.Advised to call back if continues to have swelling.Message sent to Dr.Croitoru for review.

## 2016-04-06 NOTE — Telephone Encounter (Signed)
Agree with those recommendations. MCr

## 2016-04-08 LAB — TOXASSURE SELECT,+ANTIDEPR,UR

## 2016-04-18 ENCOUNTER — Telehealth: Payer: Self-pay | Admitting: Cardiovascular Disease

## 2016-04-18 ENCOUNTER — Telehealth: Payer: Self-pay

## 2016-04-18 DIAGNOSIS — I499 Cardiac arrhythmia, unspecified: Secondary | ICD-10-CM

## 2016-04-18 NOTE — Telephone Encounter (Signed)
Spoke with patient and she stated that she has irregular heartbeat almost daily in early morning.  Thinks it did get better after taking the Atenolol in the evening but only for a short time. Has been ongoing issue for since her ED visit in November  She would like for Dr Sallyanne Kuster to order a monitor for her She declined office visit and only wanted Dr Sallyanne Kuster to review, aware will be next week before call back  Will forward for recommendations

## 2016-04-18 NOTE — Telephone Encounter (Signed)
Agree 

## 2016-04-18 NOTE — Telephone Encounter (Signed)
[facility] calls and would like to extend services for 3 additional weeks for safety in home and ADL's, VO given, do you agree?

## 2016-04-18 NOTE — Telephone Encounter (Signed)
[place] from [facility] requesting VO. Please call pt back.

## 2016-04-18 NOTE — Telephone Encounter (Signed)
New message      Pt c/o BP issue: STAT if pt c/o blurred vision, one-sided weakness or slurred speech  1. What are your last 5 BP readings? 141/64 HR 101, 103/62 HR 54, 150/67 HR 77, 144/66 HR 60 2. Are you having any other symptoms (ex. Dizziness, headache, blurred vision, passed out)?  dizziness 3. What is your BP issue?  Pt states that around 4am every morning, she wakes up with irregular heartbeat.  She states that this has being going on for a while. Please advise

## 2016-04-21 ENCOUNTER — Telehealth: Payer: Self-pay | Admitting: Internal Medicine

## 2016-04-21 NOTE — Telephone Encounter (Signed)
Would like to extend pt for 2x week for 2 weeks starting next week.

## 2016-04-24 NOTE — Telephone Encounter (Signed)
Please go ahead and order a 48 hour Holter. Thank you MCr

## 2016-04-24 NOTE — Telephone Encounter (Signed)
Jacqueline Nguyen (PT from [facility] ) requesting extension for PT 2x/week for 2 wks. VO given. Do you agree? Thanks!

## 2016-04-24 NOTE — Telephone Encounter (Signed)
Agree 

## 2016-05-01 ENCOUNTER — Ambulatory Visit (INDEPENDENT_AMBULATORY_CARE_PROVIDER_SITE_OTHER): Payer: Medicare Other

## 2016-05-01 DIAGNOSIS — I499 Cardiac arrhythmia, unspecified: Secondary | ICD-10-CM

## 2016-05-01 NOTE — Addendum Note (Signed)
Addended by: Gilles Chiquito B on: 05/01/2016 05:30 PM   Modules accepted: Level of Service

## 2016-05-04 ENCOUNTER — Telehealth: Payer: Self-pay

## 2016-05-04 NOTE — Telephone Encounter (Signed)
Is she going to continue coming here to see me or establish there with son?

## 2016-05-04 NOTE — Telephone Encounter (Signed)
Jacqueline Nguyen from [facility] [facility] requesting VO. Please call pt back.

## 2016-05-04 NOTE — Telephone Encounter (Signed)
Called pt - caregiver stated unable to talk at this time. I will call back tomorrow.

## 2016-05-04 NOTE — Telephone Encounter (Signed)
Returned [facility] - states pt is planning to move with her son in [place] area. So, they will have to d/c her from their office. But [facility] has an office in [place] (closest location). And will need a new PT/OT referral in order to continue services. She said referral can be faxed to their office @ [phone]. Thanks

## 2016-05-04 NOTE — Telephone Encounter (Signed)
Patient had monitored placed and has follow up with Dr Sallyanne Kuster

## 2016-05-05 NOTE — Telephone Encounter (Signed)
Talked to pt - pt wants Dr Daryll Drown to know she does not want to change doctors. "I don't want Gilles Chiquito think I don't want to continue seeing her". Reassured her this should not be a problem. Pt states she will only be 1 hour away. She talked about having blood clots in her lungs (not COPD as dx prior).

## 2016-05-05 NOTE — Telephone Encounter (Signed)
Pt states she wants to keep Dr Daryll Drown as her PCP. Plans to go back and forth. But wants a doctor there if she gets sick; if an emergency occurs. Wants to know if u could give a reference. Plan to keep her appt in March.

## 2016-05-05 NOTE — Telephone Encounter (Signed)
Pt needs to speak with a nurse. Please call back.

## 2016-05-09 ENCOUNTER — Encounter: Payer: Self-pay | Admitting: Cardiovascular Disease

## 2016-05-09 ENCOUNTER — Ambulatory Visit (INDEPENDENT_AMBULATORY_CARE_PROVIDER_SITE_OTHER): Payer: Medicare Other | Admitting: Cardiovascular Disease

## 2016-05-09 VITALS — BP 144/63 | HR 64 | Ht 65.0 in | Wt 225.0 lb

## 2016-05-09 DIAGNOSIS — J9611 Chronic respiratory failure with hypoxia: Secondary | ICD-10-CM | POA: Diagnosis not present

## 2016-05-09 DIAGNOSIS — Z5181 Encounter for therapeutic drug level monitoring: Secondary | ICD-10-CM

## 2016-05-09 DIAGNOSIS — I480 Paroxysmal atrial fibrillation: Secondary | ICD-10-CM

## 2016-05-09 DIAGNOSIS — I50812 Chronic right heart failure: Secondary | ICD-10-CM | POA: Diagnosis not present

## 2016-05-09 DIAGNOSIS — I10 Essential (primary) hypertension: Secondary | ICD-10-CM | POA: Diagnosis not present

## 2016-05-09 DIAGNOSIS — R202 Paresthesia of skin: Secondary | ICD-10-CM | POA: Diagnosis not present

## 2016-05-09 DIAGNOSIS — G4733 Obstructive sleep apnea (adult) (pediatric): Secondary | ICD-10-CM | POA: Diagnosis not present

## 2016-05-09 DIAGNOSIS — I272 Pulmonary hypertension, unspecified: Secondary | ICD-10-CM | POA: Diagnosis not present

## 2016-05-09 DIAGNOSIS — R5381 Other malaise: Secondary | ICD-10-CM | POA: Diagnosis not present

## 2016-05-09 DIAGNOSIS — Z7901 Long term (current) use of anticoagulants: Secondary | ICD-10-CM

## 2016-05-09 NOTE — Progress Notes (Signed)
Cardiology Office Note    Date:  05/11/2016   ID:  Jacqueline Nguyen, DOB 13-Jul-1931, MRN [id number]  PCP:  Gilles Chiquito, MD  Cardiologist:   Sanda Klein, MD   chief complaint: Right heart failure follow-up   History of Present Illness:  Jacqueline Nguyen is a 83 y.o. female who presents for right heart failure (secondary to obstructive sleep apnea, previous pulmonary embolism, obesity) and reported paroxysmal atrial fibrillation, Not documented in a long time.. She is accompanied today by her son, as before.  Her major recent complaint has been that of palpitations. On her 33 hour monitor she had frequent PACs, but no atrial fibrillation, no atrial tachycardia, no ventricular arrhythmia. The background rhythm was relatively bradycardic. Post PAC pauses or sometimes up to 2.5 seconds in duration.  She complains about paresthesia in her feet as well as cold feet and wonders whether she could have PAD.  Other than that event she has done better over the last 6 months. She is receiving regular physical therapy and this has helped a lot. She seems to have a much steadier gait.  In order to help her family with her care she is planning to move to [place]. Will continue physical therapy with the same home health provider, but from a different office.  She denies problems with bleeding, worsening dyspnea, worsening leg edema, change in chronic dyspnea level, focal neurological events, chest pain, fever, chills, cough. She has not had any bleeding problems.  In 2009 she had a "massive" bilateral pulmonary embolism and she has long-standing systemic hypertension, morbid obesity, reactive airway disease, obstructive sleep apnea (on CPAP) and osteoporosis and degenerative arthritis. She wears oxygen at night and with activity and often uses a scooter. In 2014 she had transient right eye blindness, not long after cataract surgery. Has glaucoma. Echo in August 2015 showed normal left ventricular regional  wall motion and EF 65%, no significant valvular abnormalities, estimated systolic PA pressure 40 mm Hg. Her event monitor showed frequent PACs and brief bursts of nonsustained atrial tachycardia, but there was no confirmed atrial fibrillation.    Past Medical History:  Diagnosis Date  . Arthritis   . Cataract   . Chronic respiratory failure ([place])   . Endometriosis   . HTN (hypertension)   . Hx of echocardiogram    Echo (8/15):  Mild LVH, EF 65%, no RWMA, Ao sclerosis, no AS, mod MAC, mild LAE, normal RVSF, PASP 40 mmHg  . Hx pulmonary embolism    on chronic coumadin  . Obesities, morbid ([place])   . OSA (obstructive sleep apnea)   . Osteoporosis   . PAF (paroxysmal atrial fibrillation) ([place])    Event Monitor (8/15):  Frequent PACs, brief bursts of nonsustained ATach; no AFib  . Psoriasis   . Skin cancer   . Vertigo     Past Surgical History:  Procedure Laterality Date  . cataract surgery    . GALLBLADDER SURGERY    . NOSE SURGERY    . OOPHORECTOMY    . SKIN SURGERY    . UVULOPALATOPHARYNGOPLASTY      Current Medications: Outpatient Medications Prior to Visit  Medication Sig Dispense Refill  . atenolol (TENORMIN) 25 MG tablet Take 25 mg by mouth daily.     . AZOPT 1 % ophthalmic suspension Place 1 drop into both eyes 2 (two) times daily.    . budesonide-formoterol (SYMBICORT) 160-4.5 MCG/ACT inhaler Inhale 2 puffs into the lungs 2 (two) times daily.    Marland Kitchen  COMBIGAN 0.2-0.5 % ophthalmic solution Place 1 drop into both eyes 2 (two) times daily.    . cyanocobalamin 1000 MCG tablet Take 1,000 mcg by mouth daily.    Marland Kitchen diltiazem (CARDIZEM CD) 120 MG 24 hr capsule TAKE ONE (1) CAPSULE BY MOUTH 2 TIMES DAILY <PLEASE MAKE APPOINTMENT FOR REFILLS> 180 capsule 3  . DM-Doxylamine-Acetaminophen (NYQUIL COLD & FLU PO) Take 10 mLs by mouth daily as needed (mucus).    . fluticasone (FLOVENT HFA) 44 MCG/ACT inhaler Take 2 puffs by mouth 2 (two) times daily.    . furosemide (LASIX) 20 MG tablet  Take 1 tablet (20 mg total) by mouth daily. 30 tablet 9  . Homeopathic Products (ARNICARE ARNICA) CREA Apply 1 application topically daily.     Marland Kitchen HYDROcodone-acetaminophen (NORCO) 10-325 MG tablet Take 1 tablet by mouth 3 (three) times daily as needed for severe pain. 120 tablet 0  . levalbuterol (XOPENEX) 0.63 MG/3ML nebulizer solution Take 1 ampule by nebulization every 8 (eight) hours as needed for wheezing.    . meclizine (ANTIVERT) 25 MG tablet TAKE ONE TABLET 2-3 TIMES A DAY AS NEEDED FOR VERTIGO    . Multiple Vitamin (MULTIVITAMIN WITH MINERALS) TABS tablet Take 1 tablet by mouth daily.    . NON FORMULARY Place 4 L into the nose at bedtime. 3 liter as needed during day depending on extertion     . NON FORMULARY Apply 1 application topically daily as needed. [facility] Herbal Heal Ointment for damaged skin    . NON FORMULARY Take 1-2 sprays by mouth daily as needed. Mouth Kote Dry Mouth Spray    . Nystatin ([place]) 100000 UNIT/GM POWD Apply topically.    Marland Kitchen Respiratory Therapy Supplies (FLUTTER) DEVI Blow through 4 times per set, three sets daily 1 each 0  . warfarin (COUMADIN) 2.5 MG tablet Take 1.25-2.5 mg by mouth as directed. Alternate 1.23m and 1.280mdaily     No facility-administered medications prior to visit.      Allergies:   Ciprofloxacin; Bactrim; and Codeine   Social History   Social History  . Marital status: Widowed    Spouse name: N/A  . Number of children: N/A  . Years of education: N/A   Social History Main Topics  . Smoking status: Former Smoker    Packs/day: 1.00    Years: 2.00    Types: Cigarettes    Quit date: 04/03/1981  . Smokeless tobacco: Never Used  . Alcohol use No  . Drug use: No  . Sexual activity: Not Currently   Other Topics Concern  . None   Social History Narrative   [place] at home. Has an aid who visits and helps at home often. Son works as a flCatering managernd also  visits and helps when he can. Semi-independent in ADLs, requires walker around home, dependent in IADLs.     Family History:  The patient's family history includes Heart attack in her brother and father.   ROS:   Please see the history of present illness.    ROS All other systems reviewed and are negative.   PHYSICAL EXAM:   VS:  BP (!) 144/63   Pulse 64   Ht _0  (1.651 m)   Wt 102.1 kg (225 lb)   BMI 37.44 kg/m    GEN: Morbidly obese, well developed, in no acute distress  HEENT: normal  Neck: no JVD, carotid bruits, or masses Cardiac:  RRR; 2/6 holosystolic murmur at the left lower sternal border, no diastolic murmurs, rubs, or gallops, 1-2+ pedal edema  Respiratory:  clear to auscultation bilaterally, normal work of breathing GI: soft, nontender, nondistended, + BS MS: no deformity or atrophy  Skin: warm and dry, no rash Neuro:  Alert and Oriented x 3, Strength and sensation are intact Psych: euthymic mood, full affect  Wt Readings from Last 3 Encounters:  05/09/16 102.1 kg (225 lb)  03/23/16 111.5 kg (245 lb 14.4 oz)  03/06/16 104.3 kg (230 lb)      Studies/Labs Reviewed:   EKG:  EKG is ordered today.  The ekg ordered today demonstrates Mild sinus bradycardia, poor R-wave progression, otherwise normal  Recent Labs: 02/25/2016: B Natriuretic Peptide 258.0; TSH 0.511 02/26/2016: ALT 14; BUN <5; Creatinine, Ser 0.59; Hemoglobin 13.4; Platelets 344; Potassium 3.7; Sodium 138   Lipid Panel    Component Value Date/Time   CHOL 205 (H) 08/30/2012 0550   TRIG 102 08/30/2012 0550   HDL 55 08/30/2012 0550   CHOLHDL 3.7 08/30/2012 0550   VLDL 20 08/30/2012 0550   LDLCALC 130 (H) 08/30/2012 0550     ASSESSMENT:    1. Chronic right-sided CHF (congestive heart failure)   2. Obstructive sleep apnea   3. Pulmonary hypertension   4. PAF (paroxysmal atrial fibrillation) ([place])   5. Chronic respiratory failure with hypoxia (H[facility])   6. Anticoagulated on Coumadin   7.  Physical deconditioning   8. Essential hypertension   9. Paresthesia of both lower extremities      PLAN:  In order of problems listed above:  1. Right HF: Morbid obesity has always made for a very difficult physical exam, but she does not appear to be excessively fluid overloaded today. Multifactorial related to previous pulmonary embolism, severe obesity, sleep apnea, chronic hypoxia. 2. OSA: Reports compliance with CPAP. Followed by Dr. Annamaria Boots.  3. PAH: Relatively mild by echo 4. Palpitations: secondary to PACs and post PA-C mild pauses. No specific therapy is indicated. Discussed the fact that the frequency of symptoms will probably increase whenever she uses more of her bronchodilator. Because of relative background bradycardia and reactive airway disease, I do not recommend increasing the dose of beta blocker or diltiazem. Atrial fibrillation has not been documented in a very long time, but she needs to stay on anticoagulation for the venous thromboembolic problems anyway. 5. Chronic hypoxemia : On home oxygen 3 L by nasal cannula. Clearly there is a component of obesity hypoventilation syndrome. See pulmonary function tests from 2015 with matched FVC and FEV1 under 50% of predicted. Possible also component of COPD related to previous smoking. 6. Warfarin: No bleeding complications. 7. Deconditioning: Recommend she continue physical therapy and wound care encourage her to be more physically active. Gladly renew the order for physical therapy when she moves to [place]. 8. HTN: Borderline BP control today. Based on experience with volatility in her blood pressure in the past, no changes are made her medications today. 9. Lower extremity paresthesias coolness: Check ABI. Heart is a whether her pulses are normal due to the pedal edema    Medication Adjustments/Labs and Tests Ordered: Current medicines are reviewed at length with the patient today.  Concerns regarding medicines are outlined  above.  Medication changes, Labs and Tests ordered today are listed in the Patient Instructions below. Patient Instructions  Dr Sallyanne Kuster recommends that you continue on your current medications as directed. Please refer to the Current Medication list given to you today.  Your physician has requested that you have an ankle brachial index (ABI). During this test an ultrasound and blood pressure cuff are used to evaluate the arteries that supply the arms and legs with blood. Allow thirty minutes for this exam. There are no restrictions or special instructions.  Dr Sallyanne Kuster recommends that you schedule a follow-up appointment in 6 months. You will receive a reminder letter in the mail two months in advance. If you don't receive a letter, please call our office to schedule the follow-up appointment.  If you need a refill on your cardiac medications before your next appointment, please call your pharmacy.    Signed, Sanda Klein, MD  05/11/2016 3:03 PM    [facility] [address], [place], [place]  [address] Phone: [phone]; Fax: [phone]

## 2016-05-09 NOTE — Patient Instructions (Signed)
Dr Sallyanne Kuster recommends that you continue on your current medications as directed. Please refer to the Current Medication list given to you today.  Your physician has requested that you have an ankle brachial index (ABI). During this test an ultrasound and blood pressure cuff are used to evaluate the arteries that supply the arms and legs with blood. Allow thirty minutes for this exam. There are no restrictions or special instructions.  Dr Sallyanne Kuster recommends that you schedule a follow-up appointment in 6 months. You will receive a reminder letter in the mail two months in advance. If you don't receive a letter, please call our office to schedule the follow-up appointment.  If you need a refill on your cardiac medications before your next appointment, please call your pharmacy.

## 2016-05-12 ENCOUNTER — Ambulatory Visit (INDEPENDENT_AMBULATORY_CARE_PROVIDER_SITE_OTHER): Payer: Medicare Other | Admitting: Pulmonary Disease

## 2016-05-12 ENCOUNTER — Telehealth: Payer: Self-pay

## 2016-05-12 ENCOUNTER — Encounter: Payer: Self-pay | Admitting: Pulmonary Disease

## 2016-05-12 VITALS — BP 129/50 | HR 57 | Temp 98.2°F | Ht 66.0 in | Wt 225.0 lb

## 2016-05-12 DIAGNOSIS — R5381 Other malaise: Secondary | ICD-10-CM | POA: Diagnosis not present

## 2016-05-12 DIAGNOSIS — K649 Unspecified hemorrhoids: Secondary | ICD-10-CM

## 2016-05-12 DIAGNOSIS — L989 Disorder of the skin and subcutaneous tissue, unspecified: Secondary | ICD-10-CM | POA: Diagnosis not present

## 2016-05-12 DIAGNOSIS — Z6836 Body mass index (BMI) 36.0-36.9, adult: Secondary | ICD-10-CM

## 2016-05-12 DIAGNOSIS — G8929 Other chronic pain: Secondary | ICD-10-CM | POA: Diagnosis not present

## 2016-05-12 NOTE — Patient Instructions (Signed)
Please keep your appointment with Dr. Daryll Drown We will order you some home health services

## 2016-05-12 NOTE — Telephone Encounter (Signed)
Spoke w/ pt and son, pt has some "sores" on buttocks, 1 at her rectal area is swollen, painful and hard, she is very uncomfortable. appt today 2/9 at [facility]

## 2016-05-12 NOTE — Telephone Encounter (Signed)
Pt son needs to speak with a nurse regarding sore on the buttock. Please call back.

## 2016-05-12 NOTE — Progress Notes (Signed)
CC: Buttock sores  HPI:  Jacqueline Nguyen is a 83 y.o. woman with history as noted below here for evaluation of buttock sores.  She has multiple sores on her buttocks. She has had this before which resolved with home health wound care. She thinks it is pressure sores. She cleans the sores with peroxide. She has an area near the rectum that is hard but this area has stayed stable with maybe some decrease in size. Some bleeding from sores. No increased warmth. She does walk some but is very sedentary.   She has BM 1-2 times a day. BM are formed. Sometimes strains. No problems with this for the last 1.5 weeks. She does have hemorrhoids.   Past Medical History:  Diagnosis Date  . Arthritis   . Cataract   . Chronic respiratory failure ([place])   . Endometriosis   . HTN (hypertension)   . Hx of echocardiogram    Echo (8/15):  Mild LVH, EF 65%, no RWMA, Ao sclerosis, no AS, mod MAC, mild LAE, normal RVSF, PASP 40 mmHg  . Hx pulmonary embolism    on chronic coumadin  . Obesities, morbid ([place])   . OSA (obstructive sleep apnea)   . Osteoporosis   . PAF (paroxysmal atrial fibrillation) ([place])    Event Monitor (8/15):  Frequent PACs, brief bursts of nonsustained ATach; no AFib  . Psoriasis   . Skin cancer   . Vertigo     Review of Systems:   No fevers or chills No dyspnea  Physical Exam:  Vitals:   05/12/16 1517  BP: (!) 129/50  Pulse: (!) 57  Temp: 98.2 F (36.8 C)  TempSrc: Oral  SpO2: 96%  Weight: 225 lb (102.1 kg)  Height: 5' 6" (1.676 m)   General Apperance: NAD HEENT: Normocephalic, atraumatic, anicteric sclera Neck: Supple, trachea midline Lungs: Clear to auscultation bilaterally. No wheezes, rhonchi or rales. Breathing comfortably Heart: Regular rate and rhythm Abdomen: Soft, nontender, nondistended, no rebound/guarding Extremities: Warm and well perfused Skin: left of rectum there is a 1cm oval shaped firm raised lesion that is mobile. No erythema, drainage, or  discoloaration. Nontender to palpation.  Neurologic: Alert and interactive. No gross deficits.   Assessment & Plan:   See Encounters Tab for problem based charting.  Patient discussed with Dr. Lynnae January

## 2016-05-12 NOTE — Assessment & Plan Note (Signed)
She has physical deconditioning, chronic pain, morbid obesity that keeps her largely sedentary. Higher risk for pressure ulcers. None observed today in clinic but she does have an area near her rectum that looks/feels like a cyst.   Will need to keep eye on that area. Not currently infected - does not need drainage or surgery referral Home health eval per patient request

## 2016-05-15 ENCOUNTER — Telehealth: Payer: Self-pay

## 2016-05-15 NOTE — Telephone Encounter (Signed)
Spoke with Jacqueline Nguyen regarding home health referral son states the do not have preference of providers advised him that I will make referral to [facility] they will contact him today to schedule visit

## 2016-05-16 NOTE — Telephone Encounter (Signed)
Heather ([facility] nurse) called with Plan of care for patient. She will use barrier cream to redness on buttock area & will continue to monitor.

## 2016-05-19 NOTE — Progress Notes (Signed)
Internal Medicine Clinic Attending  Case discussed with Dr. Randell Patient soon after the resident saw the patient.  We reviewed the resident's history and exam and pertinent patient test results.  I agree with the assessment, diagnosis, and plan of care documented in the resident's note.

## 2016-05-22 ENCOUNTER — Telehealth: Payer: Self-pay | Admitting: *Deleted

## 2016-05-22 NOTE — Telephone Encounter (Signed)
Call from [facility] ,[facility] - states pt has moved to [facility] with her son and wanted [place] to know she will discharged from their office here.

## 2016-05-23 ENCOUNTER — Other Ambulatory Visit: Payer: Self-pay | Admitting: Internal Medicine

## 2016-05-23 NOTE — Telephone Encounter (Signed)
I am not sure.  I will need to contact Jacqueline Nguyen and see what her plans are.  I know she wants to stay a patient here, but it may be easier if she establish closer to home to get services she needs there.  Will attempt to contact her tomorrow.

## 2016-05-24 ENCOUNTER — Telehealth: Payer: Self-pay

## 2016-05-24 NOTE — Telephone Encounter (Signed)
VO from [facility] for PT and OT eval and treat Nurse has suggested to use a medicated baby powder  For skin on skin rubbing and moisture Are you ok?

## 2016-05-24 NOTE — Telephone Encounter (Signed)
Jacqueline Nguyen from [facility] requesting VO. Please call pt back.

## 2016-05-25 NOTE — Telephone Encounter (Signed)
That is fine.  No talcum powder please.  Thanks

## 2016-05-26 ENCOUNTER — Telehealth: Payer: Self-pay

## 2016-05-26 ENCOUNTER — Other Ambulatory Visit: Payer: Self-pay | Admitting: Cardiovascular Disease

## 2016-05-26 DIAGNOSIS — R202 Paresthesia of skin: Principal | ICD-10-CM

## 2016-05-26 DIAGNOSIS — M79609 Pain in unspecified limb: Secondary | ICD-10-CM

## 2016-05-26 NOTE — Telephone Encounter (Signed)
Melissa from [facility] requesting VO. Please call back.

## 2016-05-26 NOTE — Telephone Encounter (Signed)
VO for PT for gait training and strength 2x week for 4 weeks, do you agree?

## 2016-05-30 ENCOUNTER — Other Ambulatory Visit: Payer: Self-pay | Admitting: *Deleted

## 2016-05-30 ENCOUNTER — Telehealth: Payer: Self-pay | Admitting: Internal Medicine

## 2016-05-30 DIAGNOSIS — I10 Essential (primary) hypertension: Secondary | ICD-10-CM

## 2016-05-30 DIAGNOSIS — R0602 Shortness of breath: Secondary | ICD-10-CM

## 2016-05-30 NOTE — Telephone Encounter (Signed)
Pt is having her INR switched to dr Daryll Drown, pt does it at home and it is recorded by a lab company and will be sent to [facility]

## 2016-05-30 NOTE — Telephone Encounter (Signed)
Pt call back states pharmacy is [facility]  # [phone]

## 2016-05-30 NOTE — Telephone Encounter (Signed)
Agree 

## 2016-05-30 NOTE — Telephone Encounter (Signed)
furosemide (LASIX) 20 MG tablet warfarin (COUMADIN) 2.5 MG tablet  Needs refills. Pt moved supposed to call with new pharmacy. Pt was unsure of what the pharmacy was she needed to use

## 2016-05-31 ENCOUNTER — Telehealth: Payer: Self-pay | Admitting: *Deleted

## 2016-05-31 MED ORDER — WARFARIN SODIUM 2.5 MG PO TABS: 1.2500 mg | ORAL_TABLET | ORAL | 0 refills | Status: DC

## 2016-05-31 MED ORDER — FUROSEMIDE 20 MG PO TABS: 20.0000 mg | ORAL_TABLET | Freq: Every day | ORAL | 1 refills | Status: DC

## 2016-05-31 NOTE — Telephone Encounter (Signed)
INR 2/26 was 2.1 Pt checks every Monday She calls remote services

## 2016-05-31 NOTE — Telephone Encounter (Signed)
Thanks for finding that out [place].  I have discussed this with Dr. Maudie Mercury and she is going to follow, so INR should be routed to her.  We need her most recent INR, do you think she could send that to [place] verbally?

## 2016-05-31 NOTE — Telephone Encounter (Signed)
Pt states today when speaking to her about INR and routine that she uses magic mouthwash occasionally and bottle is almost empty, states she easily gets thrush, states the new pharmacy is sending a refill request for it. She then states that she thinks she has lost #3 of her pain med scripts in her move to [place], she states she and her son will look for it tonight but can it be replaced if she cant find it, she states she knows she will have withdrawal if she does not take it and she is afraid of needing to be hospitalized or being very sick from withdrawal. I will touch base with her tomorrow and see what she says, she states she is very embarrassed because she understands it puts her in a suspicious way and dr Daryll Drown in very difficult way. Hopefully she will find it

## 2016-06-01 NOTE — Telephone Encounter (Signed)
Thanks Bonnita Nasuti - -  I will not be here this afternoon, so can I do it tmw morning if she cannot find it?    Thanks

## 2016-06-01 NOTE — Telephone Encounter (Signed)
Thanks!

## 2016-06-02 ENCOUNTER — Inpatient Hospital Stay (HOSPITAL_COMMUNITY): Admission: RE | Admit: 2016-06-02 | Payer: Medicare Other | Source: Ambulatory Visit

## 2016-06-02 NOTE — Telephone Encounter (Signed)
Pt was called, she cannot find the script, her last refill was 2/7 so she is good until the end of next week but need to address by wed or thurs to give her time for travel plans, thanks!

## 2016-06-05 NOTE — Telephone Encounter (Signed)
Asking to speak with Bonnita Nasuti. Please call back.

## 2016-06-05 NOTE — Telephone Encounter (Signed)
Pt is calling to let us know she FOUND her medication.

## 2016-06-05 NOTE — Telephone Encounter (Signed)
Pt has found her script, we are good!

## 2016-06-14 ENCOUNTER — Telehealth: Payer: Self-pay | Admitting: Internal Medicine

## 2016-06-14 NOTE — Telephone Encounter (Signed)
Pt call states someone call her but unable to check messages.

## 2016-06-14 NOTE — Telephone Encounter (Signed)
Called and spoke to [facility], she states pt has some areas of rash or bites on her shoulders, appr 16, HHN has been monitoring they are healing but pt had told her she felt like she had bugs crawling on her, she states that pt is not too concerned with it but she wanted to let [facility] know. She was ask to continue to assess and notify imc if she has any more or pt continues to complain. Will f/u at next appt. HHN agreeable

## 2016-06-14 NOTE — Telephone Encounter (Signed)
Calling with concerns for patient please call

## 2016-06-16 ENCOUNTER — Other Ambulatory Visit: Payer: Self-pay | Admitting: *Deleted

## 2016-06-16 MED ORDER — BUDESONIDE-FORMOTEROL FUMARATE 160-4.5 MCG/ACT IN AERO: 2.0000 | INHALATION_SPRAY | Freq: Two times a day (BID) | RESPIRATORY_TRACT | 3 refills | Status: DC

## 2016-06-16 NOTE — Telephone Encounter (Signed)
Agree

## 2016-06-28 ENCOUNTER — Ambulatory Visit (INDEPENDENT_AMBULATORY_CARE_PROVIDER_SITE_OTHER): Payer: Medicare Other | Admitting: Internal Medicine

## 2016-06-28 ENCOUNTER — Telehealth: Payer: Self-pay | Admitting: *Deleted

## 2016-06-28 ENCOUNTER — Ambulatory Visit (HOSPITAL_COMMUNITY)
Admission: RE | Admit: 2016-06-28 | Discharge: 2016-06-28 | Disposition: A | Discharge status: Home / Self Care | Payer: Medicare Other | Source: Ambulatory Visit | Attending: Internal Medicine | Admitting: Internal Medicine

## 2016-06-28 ENCOUNTER — Encounter: Payer: Self-pay | Admitting: Internal Medicine

## 2016-06-28 ENCOUNTER — Inpatient Hospital Stay (HOSPITAL_COMMUNITY): Admission: RE | Admit: 2016-06-28 | Payer: Medicare Other | Source: Ambulatory Visit

## 2016-06-28 VITALS — BP 143/45 | HR 58 | Temp 98.1°F | Ht 65.0 in | Wt 244.2 lb

## 2016-06-28 DIAGNOSIS — I50812 Chronic right heart failure: Secondary | ICD-10-CM | POA: Diagnosis not present

## 2016-06-28 DIAGNOSIS — Z86711 Personal history of pulmonary embolism: Secondary | ICD-10-CM

## 2016-06-28 DIAGNOSIS — I110 Hypertensive heart disease with heart failure: Secondary | ICD-10-CM

## 2016-06-28 DIAGNOSIS — E538 Deficiency of other specified B group vitamins: Secondary | ICD-10-CM

## 2016-06-28 DIAGNOSIS — J9611 Chronic respiratory failure with hypoxia: Secondary | ICD-10-CM

## 2016-06-28 DIAGNOSIS — I480 Paroxysmal atrial fibrillation: Secondary | ICD-10-CM

## 2016-06-28 DIAGNOSIS — Z6841 Body Mass Index (BMI) 40.0 and over, adult: Secondary | ICD-10-CM

## 2016-06-28 DIAGNOSIS — R200 Anesthesia of skin: Secondary | ICD-10-CM

## 2016-06-28 DIAGNOSIS — H9201 Otalgia, right ear: Secondary | ICD-10-CM

## 2016-06-28 DIAGNOSIS — I509 Heart failure, unspecified: Secondary | ICD-10-CM | POA: Insufficient documentation

## 2016-06-28 DIAGNOSIS — Z79891 Long term (current) use of opiate analgesic: Secondary | ICD-10-CM

## 2016-06-28 DIAGNOSIS — Z9981 Dependence on supplemental oxygen: Secondary | ICD-10-CM

## 2016-06-28 DIAGNOSIS — R202 Paresthesia of skin: Secondary | ICD-10-CM | POA: Diagnosis not present

## 2016-06-28 DIAGNOSIS — J9610 Chronic respiratory failure, unspecified whether with hypoxia or hypercapnia: Secondary | ICD-10-CM

## 2016-06-28 DIAGNOSIS — I82402 Acute embolism and thrombosis of unspecified deep veins of left lower extremity: Secondary | ICD-10-CM

## 2016-06-28 DIAGNOSIS — R5381 Other malaise: Secondary | ICD-10-CM

## 2016-06-28 DIAGNOSIS — Z87891 Personal history of nicotine dependence: Secondary | ICD-10-CM

## 2016-06-28 DIAGNOSIS — Z9989 Dependence on other enabling machines and devices: Secondary | ICD-10-CM

## 2016-06-28 DIAGNOSIS — Z7901 Long term (current) use of anticoagulants: Secondary | ICD-10-CM

## 2016-06-28 DIAGNOSIS — M5030 Other cervical disc degeneration, unspecified cervical region: Secondary | ICD-10-CM

## 2016-06-28 DIAGNOSIS — Z86718 Personal history of other venous thrombosis and embolism: Secondary | ICD-10-CM

## 2016-06-28 DIAGNOSIS — E559 Vitamin D deficiency, unspecified: Secondary | ICD-10-CM

## 2016-06-28 DIAGNOSIS — I2729 Other secondary pulmonary hypertension: Secondary | ICD-10-CM | POA: Diagnosis not present

## 2016-06-28 DIAGNOSIS — G4733 Obstructive sleep apnea (adult) (pediatric): Secondary | ICD-10-CM

## 2016-06-28 DIAGNOSIS — G894 Chronic pain syndrome: Secondary | ICD-10-CM | POA: Diagnosis not present

## 2016-06-28 DIAGNOSIS — R531 Weakness: Secondary | ICD-10-CM

## 2016-06-28 DIAGNOSIS — R21 Rash and other nonspecific skin eruption: Secondary | ICD-10-CM

## 2016-06-28 DIAGNOSIS — I10 Essential (primary) hypertension: Secondary | ICD-10-CM

## 2016-06-28 LAB — BRAIN NATRIURETIC PEPTIDE: B NATRIURETIC PEPTIDE 5: 246.5 pg/mL — AB (ref 0.0–100.0)

## 2016-06-28 LAB — TROPONIN I: Troponin I: <0.03 ng/mL (ref <0.03)

## 2016-06-28 MED ORDER — HYDROCODONE-ACETAMINOPHEN 10-325 MG PO TABS: 1.0000 | ORAL_TABLET | Freq: Three times a day (TID) | ORAL | 0 refills | Status: DC | PRN

## 2016-06-28 MED ORDER — HYDROCORTISONE-ACETIC ACID 1-2 % OT SOLN: 3.0000 [drp] | Freq: Three times a day (TID) | OTIC | 0 refills | Status: DC

## 2016-06-28 NOTE — Assessment & Plan Note (Signed)
She is on supplementation and reports being deficient in the past.    Check B12 level today.

## 2016-06-28 NOTE — Assessment & Plan Note (Signed)
[place] narcotic database reviewed for last 12 months and appropriate.  UDS in December was appropriate.  She is using her medications to be able to work with PT and move around the house.  She has some symptoms of dependence and tolerance.  She could benefit from tapering in the future.  Plan will be to maintain dose for now.   Plan Hydrocodone/apap Rx X 3 provided today.   3 month follow up.

## 2016-06-28 NOTE — Progress Notes (Addendum)
Subjective:    Patient ID: Jacqueline Nguyen, female    DOB: 12-16-1931, 83 y.o.   MRN: [id number]  CC: 3 month follow up for HTN  HPI   Jacqueline Nguyen is a pleasant 83yo woman with PMH of PAF, HTN, DVT/PE, right sided CHF/dCHF with grade 2 diastolic dysfunction, OSA, CRF on oxygen, arthritis (neck, hip), deconditioning, vitamin B12 and D deficiencies who presents for follow up.    Jacqueline Nguyen has multiple complaints today, most concerning to her is her ear pain.  She has been having what she describes as ear fullness and swelling, but just at the opening of the EAC on the right.  She feels there is a blister or something there.   Has been treated before with steroid cream in canal in Oct 2017 for similar symptoms.  She has no drainage or hearing loss.  This seems to be recurrence of previous problem.   Also concerning to her is increased DOE.  She cannot define exactly when it started, but she reports taking her lasix as prescribed since she was last hospitalized.  She notes that previous to this hospitalization, she was only using her oxygen when sleeping.  Now she has increased to using it with activity such as PT and more recently with any activity.  She has not noted low pulse ox, actually in PT her pulse ox has been in the high 90s and was 99% when she arrived to the clinic today. She does not have a pulse oximeter at home.  She does note some swelling in her ankles, but this does not seem to be overtly worse compared to previous.  She is speaking in full sentences.  She reports some reproducible chest pain which is improved with topical pain medication.  She sleeps in a chair at night, but this is unchanged from previous; she notes not being able to lay flat for quite a while.  She denies any palpations, but does note that her HR is higher in the mornings.  She did not take her lasix today.    She has been having achy pain all over and in multiple joints.  She is taking hydrocodone up to 4 times per day.  She  also takes tumeric in between doses to help.  She had a neck xray at last visit which showed multilevel degeneration.   Resolving rash - appears to be crusted over and healing.  These appear to be bites.  Only started after moving to new place.    Review of Systems  Constitutional: Negative for chills, fever and unexpected weight change.  Respiratory: Positive for chest tightness (reproducible, better with pain cream), shortness of breath and wheezing. Negative for cough.   Cardiovascular: Positive for leg swelling. Negative for chest pain and palpitations.  Musculoskeletal: Positive for arthralgias, back pain and neck pain.  Skin: Positive for rash (appear to be bug bites).  Neurological: Positive for weakness (generalized). Negative for dizziness and light-headedness.       Objective:   Physical Exam  Constitutional: She is oriented to person, place, and time. No distress.  Elderly woman, sitting in wheelchair  HENT:  Head: Normocephalic and atraumatic.  Right ear canal with erythema and small punctate area of wound at the entry to the canal, six o clock.  She has an intact ear drum on the right.  Left canal appears similar, but less erythematous  Eyes: Conjunctivae are normal. No scleral icterus.  Cardiovascular: Normal rate and regular rhythm.  No murmur heard. Possible JVD on the left, however, patient unable to lie down.   Pulmonary/Chest: Effort normal and breath sounds normal. No respiratory distress. She exhibits no tenderness.  Very mild crackles at bases  Musculoskeletal: She exhibits no edema or tenderness.  Neurological: She is alert and oriented to person, place, and time.  Skin: She is not diaphoretic. No erythema.  She has some crusted over and healing spots on shoulders and buttocks, note that they are itchy.  Appear to be bug bites  Psychiatric: She has a normal mood and affect. Her behavior is normal.    EKG - sinus bradycardia.  CMET, CBC, BNP (stable from  last visit), TnI (< 0.03), B12, Vitamin D and magnesium level today      Assessment & Plan:  RTC in 3 months

## 2016-06-28 NOTE — Telephone Encounter (Signed)
Stated she just arrived home and INR is in her pocketbook. Told her I will call her back tomorrow.

## 2016-06-28 NOTE — Assessment & Plan Note (Signed)
Would consider nutrition therapy, however, she lives at [facility] now.  Will discuss with son at next visit possibly finding a nutritionist there to help with weight loss if possible.

## 2016-06-28 NOTE — Patient Instructions (Addendum)
Jacqueline Nguyen - -   It was a pleasure to see you today!  For your external ear pain, please try the acetic acid/hydrocortisone drops that I sent to your pharmacy for 7 days.  Please call if you are not improving.    For your shortness of breath, I will call with you with results of our blood work today and let you know if you should increase your lasix.   For urgent reasons, please go to the closest hospital or Urgent care based on where you live in [facility].   Thank you!   Acetic Acid; Hydrocortisone Ear Solution What is this medicine? ACETIC ACID; HYDROCORTISONE (a SEE tik AS id; hye droe KOR ti sone) is used to treat outer ear infections. This medicine may be used for other purposes; ask your health care provider or pharmacist if you have questions. COMMON BRAND NAME(S): Acetasol HC, Otomycet-HC, VoSoL HC What should I tell my health care provider before I take this medicine? They need to know if you have any of these conditions: -herpes infection -ruptured ear drum -vaccinia (the skin blister that occurs after a smallpox vaccine) -an unusual or allergic reaction to acetic acid, hydrocortisone, propylene glycol, other medicines, foods, dyes, or preservatives -pregnant or trying to get pregnant -breast-feeding How should I use this medicine? This medicine is only for use in your ears. Follow the directions on the prescription label. Wash your hands with soap and water. First, carefully clean your ear(s) with a dry cotton swab. Gently warm the bottle by holding it in the hand for 1 to 2 minutes. Use the ear solution as directed by your doctor or health care professional. Shanda Howells down on your side with the affected ear up. Try not to touch the tip of the dropper to your ear, fingertips, or other surface. Squeeze the bottle gently to put the prescribed number of drops in the ear canal. Stay in this position for 30 to 60 seconds to help the drops soak into the ear. Repeat, if necessary, for the  opposite ear. Do not use your medicine more often than directed. Finish the full course of medicine prescribed by your health care professional even if you think your condition is better. Talk to your pediatrician regarding the use of this medicine in children. While this drug may be prescribed for children as young as 23 years of age and older for selected conditions, precautions do apply. Overdosage: If you think you have taken too much of this medicine contact a poison control center or emergency room at once. NOTE: This medicine is only for you. Do not share this medicine with others. What if I miss a dose? If you miss a dose, use it as soon as you can. If it is almost time for your next dose, use only that dose. Do not use double or extra doses. What may interact with this medicine? Interactions are not expected. Do not use other ear products without talking to your doctor or health care professional. This list may not describe all possible interactions. Give your health care provider a list of all the medicines, herbs, non-prescription drugs, or dietary supplements you use. Also tell them if you smoke, drink alcohol, or use illegal drugs. Some items may interact with your medicine. What should I watch for while using this medicine? Tell your doctor or health care professional if your ear infection does not get better in a few days. If a rash or allergic reaction occurs, stop using this  product right away and contact your doctor or health care professional. It is important that you keep the infected ear(s) clean and dry. When bathing, try not to get the infected ear(s) wet. Do not go swimming unless your doctor or health care professional has told you otherwise. To prevent the spread of infection, do not share ear products or share towels and washcloths with anyone else. What side effects may I notice from receiving this medicine? Side effects that you should report to your doctor or health care  professional as soon as possible: -allergic reactions like skin rash, itching or hives, swelling of the face, lips, or tongue -burning and redness -worsening ear pain Side effects that usually do not require medical attention (report to your doctor or health care professional if they continue or are bothersome): -unpleasant feeling while putting the drops in the ear This list may not describe all possible side effects. Call your doctor for medical advice about side effects. You may report side effects to FDA at 1-800-FDA-1088. Where should I keep my medicine? Keep out of the reach of children. Store at room temperature between 15 and 30 degrees C (59 and 86 degrees F). Do not freeze. Protect from light. Throw away any unused medicine after the expiration date. NOTE: This sheet is a summary. It may not cover all possible information. If you have questions about this medicine, talk to your doctor, pharmacist, or health care provider.  2018 Elsevier/Gold Standard (2007-06-13 10:56:41)

## 2016-06-28 NOTE — Assessment & Plan Note (Signed)
HR is in the 50s today and she is regular on exam, which appears to be the norm for her.  I reviewed Dr. Victorino December note from February of this year and he recommended staying on the same medications.  Will continue atenolol and diltiazem.   Plan Continue atenolol and diltiazem Continue coumadin, INR monitoring.

## 2016-06-28 NOTE — Telephone Encounter (Signed)
-----  Message from [name], MD sent at 06/28/2016  1:59 PM EDT ----- Can one of you please call the patient and ask her what her last INR was?  I forgot to ask in the visit.    Thanks!  EBM

## 2016-06-28 NOTE — Assessment & Plan Note (Signed)
Reviewed pulmonary note from December and at that time she was also wearing her oxygen most of the time, so the increased use is likely chronic at this point.  She has multiple reasons to have this and is maintaining very good oxygen saturation with 3L.   Plan Continue inhalers, limit use of xopenex due to frequent PVCs Continue CPAP Follow up with pulmonary has planned.

## 2016-06-28 NOTE — Assessment & Plan Note (Signed)
She is doing home PT and feels that it is helping with her generalized weakness, this was continued.

## 2016-06-28 NOTE — Assessment & Plan Note (Signed)
She has history of VTE and PE.  She is on coumadin.  She reports good compliance and appropriate INR.   She does home measurement of her INR and will continue to do this.    I signed the paperwork today to get these levels and follow up on them and discussed with my pharmacist Dr. Maudie Mercury as well.   Continue coumadin.

## 2016-06-28 NOTE — Assessment & Plan Note (Signed)
She reports to me that this is intermittent now.  She does have degenerative disease in her neck, but she is not interested in higher level of care, such as injections at this time.  I am not sure if she would be an appropriate candidate given coumadin use.   Continue prn hydrocodone for pain

## 2016-06-28 NOTE — Assessment & Plan Note (Signed)
She is on supplementation and reports being deficient in the past.   Check Vitamin D level today.

## 2016-06-28 NOTE — Assessment & Plan Note (Addendum)
She has many reasons to have worsening SOB.  She has mild pHTN, OSA, likely OHS, deconditioning and heart failure.  Given her habitus, JVD was hard to discern today.  She is taking her lasix faithfully per her.  Her BNP is not elevated today beyond previous levels (> 100).  I advised her to get a pulse ox monitor and to let me know if she truly becomes hypoxic.    Continue current therapy with good BP control, lasix.  Continue follow up with cardiology

## 2016-06-28 NOTE — Assessment & Plan Note (Addendum)
BP 143/45 today which is likely a little above goal.  On review of Cardiology note, she has had labile BP in the past and I think this is a good level for her given age and significant comorbidities.   Continue diltiazem and atenolol and lasix.  Check Renal function today.

## 2016-06-29 LAB — VITAMIN D 25 HYDROXY (VIT D DEFICIENCY, FRACTURES): VIT D 25 HYDROXY: 34 ng/mL (ref 30.0–100.0)

## 2016-06-29 LAB — CMP14 + ANION GAP
ALT: 10 IU/L (ref 0–32)
ANION GAP: 15 mmol/L (ref 10.0–18.0)
AST: 16 IU/L (ref 0–40)
Albumin/Globulin Ratio: 1.5 (ref 1.2–2.2)
Albumin: 4 g/dL (ref 3.5–4.7)
Alkaline Phosphatase: 58 IU/L (ref 39–117)
BUN/Creatinine Ratio: 11 — ABNORMAL LOW (ref 12–28)
BUN: 7 mg/dL — AB (ref 8–27)
Bilirubin Total: 0.3 mg/dL (ref 0.0–1.2)
CALCIUM: 9.5 mg/dL (ref 8.7–10.3)
CHLORIDE: 100 mmol/L (ref 96–106)
CO2: 29 mmol/L (ref 18–29)
CREATININE: 0.61 mg/dL (ref 0.57–1.00)
GFR calc non Af Amer: 85 mL/min/{1.73_m2} (ref >59)
GFR, EST AFRICAN AMERICAN: 98 mL/min/{1.73_m2} (ref >59)
GLUCOSE: 92 mg/dL (ref 65–99)
Globulin, Total: 2.6 g/dL (ref 1.5–4.5)
Potassium: 4.2 mmol/L (ref 3.5–5.2)
Sodium: 144 mmol/L (ref 134–144)
Total Protein: 6.6 g/dL (ref 6.0–8.5)

## 2016-06-29 LAB — VITAMIN B12: Vitamin B-12: >2000 pg/mL — ABNORMAL HIGH (ref 232–1245)

## 2016-06-29 LAB — CBC
HEMOGLOBIN: 13.2 g/dL (ref 11.1–15.9)
Hematocrit: 41.9 % (ref 34.0–46.6)
MCH: 29.4 pg (ref 26.6–33.0)
MCHC: 31.5 g/dL (ref 31.5–35.7)
MCV: 93 fL (ref 79–97)
Platelets: 400 10*3/uL — ABNORMAL HIGH (ref 150–379)
RBC: 4.49 x10E6/uL (ref 3.77–5.28)
RDW: 13.4 % (ref 12.3–15.4)
WBC: 6.3 10*3/uL (ref 3.4–10.8)

## 2016-06-29 LAB — MAGNESIUM: MAGNESIUM: 2 mg/dL (ref 1.6–2.3)

## 2016-06-29 NOTE — Telephone Encounter (Signed)
INR 2.8 done on Monday 3/26. Also stated forgot to mention having thrush/mouth sores - needs refill on MMW/lidocaine takes 10 ml 4 times a day. [facility] to [facility]. Thanks.

## 2016-07-04 ENCOUNTER — Telehealth: Payer: Self-pay | Admitting: *Deleted

## 2016-07-04 ENCOUNTER — Encounter: Payer: Self-pay | Admitting: Internal Medicine

## 2016-07-04 NOTE — Telephone Encounter (Signed)
Pt informed labs normal and copy will be mailed per Dr Daryll Drown. Also she asked about refilling her pain med - told her when she's low on Hydrocodone to call and we will send request to Dr Daryll Drown.

## 2016-07-04 NOTE — Telephone Encounter (Signed)
Call from pt's son,Terry - states pt unable to test INR b/c the company has not received completed form. Form was given to Dr Daryll Drown last week; completed and faxed.  I called the company - was given a different fax# [phone]. I re-fax form to this #. Company suppose to call me back when received fax.

## 2016-07-04 NOTE — Telephone Encounter (Signed)
There are many different ways that can be formulated.  Does she know what was previously in hers?  I do not have it on her medication list.    Thanks.

## 2016-07-04 NOTE — Telephone Encounter (Signed)
Thank you for taking care of this Jacqueline Nguyen.   Can you please let Ms. Loyal know that labs were normal and I will be sending them to her by mail.   Thank you!

## 2016-07-04 NOTE — Telephone Encounter (Signed)
Stated she will call me back once she able to look at [facility] medication bottle for formulation.

## 2016-07-04 NOTE — Telephone Encounter (Signed)
Call from pt -requesting lab results.

## 2016-07-04 NOTE — Telephone Encounter (Signed)
Pt had called/left message to call previous pharmacy for formulation of MMW.  I called [facility] pharmacist said last refilled in December 2017, she received 140 ml which consist of 35 ml Nystatin suspension, 35 ml Dexamethasone, 15.5 ml Lidocaine and 54.5 ml Benadryl.

## 2016-07-06 NOTE — Telephone Encounter (Signed)
Pt called back - stated the company need NPI number. I wrote Dr Doristine Section NPI on form and re-faxed form.

## 2016-07-07 ENCOUNTER — Telehealth: Payer: Self-pay | Admitting: Internal Medicine

## 2016-07-07 NOTE — Telephone Encounter (Signed)
Pt called and stated that her INR Papers were not rec'd with the NPI Correction on it.  Please call patient back.

## 2016-07-10 ENCOUNTER — Telehealth: Payer: Self-pay

## 2016-07-10 MED ORDER — MAGIC MOUTHWASH W/LIDOCAINE: 5.0000 mL | Freq: Three times a day (TID) | ORAL | 0 refills | Status: DC | PRN

## 2016-07-10 NOTE — Telephone Encounter (Signed)
I placed the order, stated it needed to be called in.  Thank yoU!

## 2016-07-10 NOTE — Telephone Encounter (Signed)
See below

## 2016-07-10 NOTE — Telephone Encounter (Signed)
Needs to speak with a nurse about meds. Please call back.  

## 2016-07-10 NOTE — Telephone Encounter (Signed)
I rtc , pt is upset, stating the company that supplies her INR testing has requested her meter back due to not being able to receive a correct form from the office, the rep [place] states the company has faxed the form 4 times and each time it was done incorrectly, I have ask them to fax 1 more time and dr Daryll Drown will make sure it is done correctly. They will call wed or Thursday and give the answer

## 2016-07-11 ENCOUNTER — Telehealth: Payer: Self-pay

## 2016-07-11 NOTE — Telephone Encounter (Signed)
Jacqueline Nguyen from [place] needs to speak with a nurse about   magic mouthwash w/lidocaine SOLN. Please call back.

## 2016-07-11 NOTE — Telephone Encounter (Signed)
[facility].

## 2016-07-11 NOTE — Telephone Encounter (Signed)
I talked to pt this morning - informed the company has requested her to return machine. I left message for [facility].  Received a call from [place], supervisor from INR testing strip company. Stated she has not received any forms; not the one I faxed last week nor the one from [place]. Stated to fax form to [phone] Attn: Lollie Marrow.; which  is the same fax# as I was informed to use.  Daisy who's in triage stated she will re-fax form.

## 2016-07-11 NOTE — Telephone Encounter (Signed)
I called [facility] - requesting clarification of MMW instructions. Repeated instructions per order/Dr Daryll Drown.

## 2016-07-12 ENCOUNTER — Telehealth: Payer: Self-pay | Admitting: *Deleted

## 2016-07-12 NOTE — Telephone Encounter (Signed)
Called again today to speak w/ Lollie Marrow, lm for rtc

## 2016-07-12 NOTE — Telephone Encounter (Addendum)
Received call from [facility], at INR [phone]) stated form was received; done correctly and pt's information has been update and should receive her supplies in about 2 days.  Pt was called/informed.

## 2016-07-13 ENCOUNTER — Telehealth: Payer: Self-pay | Admitting: *Deleted

## 2016-07-13 NOTE — Telephone Encounter (Signed)
Dr Daryll Drown, pt called back and states her son said they were not given any scripts at the last visit. They both state they were not given disch papers or scripts. Please advise

## 2016-07-13 NOTE — Telephone Encounter (Signed)
Pt calls and states she needs prescriptions for pain med. Spoke w/ dr Daryll Drown and confirmed 3 given at last visit 3/28, pt states she does not have them, ask her to check with her son. She will call back this pm after speaking with him

## 2016-07-13 NOTE — Telephone Encounter (Signed)
Asking to speak with Jacqueline Nguyen. Please call back.

## 2016-07-14 ENCOUNTER — Other Ambulatory Visit: Payer: Self-pay | Admitting: Internal Medicine

## 2016-07-14 MED ORDER — HYDROCODONE-ACETAMINOPHEN 10-325 MG PO TABS: 1.0000 | ORAL_TABLET | Freq: Three times a day (TID) | ORAL | 0 refills | Status: DC | PRN

## 2016-07-14 NOTE — Telephone Encounter (Signed)
I reviewed the chart and looks I didn't print an AVS.  Not sure why that didn't happen.  But does look like I printed the scripts.  Will given benefit of doubt and print them again for her.  Will be down to clinic in about 10 minutes.  They can come get them Monday.   Thanks!  EBM

## 2016-07-16 ENCOUNTER — Other Ambulatory Visit: Payer: Self-pay | Admitting: Internal Medicine

## 2016-07-17 ENCOUNTER — Telehealth (INDEPENDENT_AMBULATORY_CARE_PROVIDER_SITE_OTHER): Payer: Medicare Other | Admitting: Pharmacist

## 2016-07-17 ENCOUNTER — Other Ambulatory Visit: Payer: Self-pay

## 2016-07-17 DIAGNOSIS — Z7901 Long term (current) use of anticoagulants: Secondary | ICD-10-CM

## 2016-07-17 DIAGNOSIS — Z86711 Personal history of pulmonary embolism: Secondary | ICD-10-CM

## 2016-07-17 DIAGNOSIS — I82402 Acute embolism and thrombosis of unspecified deep veins of left lower extremity: Secondary | ICD-10-CM | POA: Diagnosis not present

## 2016-07-17 DIAGNOSIS — Z5181 Encounter for therapeutic drug level monitoring: Secondary | ICD-10-CM | POA: Diagnosis not present

## 2016-07-17 LAB — POCT INR: INR: 2.1

## 2016-07-17 NOTE — Telephone Encounter (Signed)
Spoke to pt, her son is coming for scripts

## 2016-07-17 NOTE — Telephone Encounter (Signed)
Asking to speak with Bonnita Nasuti about meds. Please call pt back.

## 2016-07-19 MED ORDER — WARFARIN SODIUM 2.5 MG PO TABS: 2.5000 mg | ORAL_TABLET | Freq: Every day | ORAL | 1 refills | Status: DC

## 2016-07-19 NOTE — Patient Instructions (Signed)
Patient educated about medication as defined in this encounter and verbalized understanding by repeating back instructions provided.

## 2016-07-19 NOTE — Progress Notes (Signed)
Anticoagulation Management Jacqueline Nguyen is a 83 y.o. female who was contacted for monitoring of warfarin treatment.  INR results faxed to [place] from [facility] using patient home meter  Indication: history of DVT and PE Duration: indefinite Supervising physician: Jacqueline Nguyen Clinic Visit History: Patient does not report signs/symptoms of bleeding or thromboembolism   Anticoagulation Episode Summary    Current INR goal:   2.0-3.0  TTR:   -  Next INR check:   07/24/2016  INR from last check:   2.1 (07/17/2016)  Weekly max dose:     Target end date:     INR check location:     Preferred lab:     Send INR reminders to:      Indications   DVT (deep venous thrombosis) (HCC) [I82.409] Anticoagulated on Coumadin [Z51.81 Z79.01] History of pulmonary embolism [Z86.711]       Comments:         Anticoagulation Care Providers    Provider Role Specialty Phone number   Jacqueline Falcon, MD Responsible Internal Medicine [phone]     ASSESSMENT Recent Results: The most recent result is correlated with 17.5 mg per week: Lab Results  Component Value Date   INR 2.1 07/17/2016   INR 2.86 02/26/2016   INR 2.84 02/25/2016   Anticoagulation Dosing: INR as of 07/17/2016 and Previous Dosing Information    INR Dt INR Goal [employer] Sun Mon Tue Wed Thu Fri Sat   07/17/2016 2.1 -            Anticoagulation Dose Instructions as of 07/17/2016      Total Sun Mon Tue Wed Thu Fri Sat   New Dose 17.5 mg 2.5 mg 2.5 mg 2.5 mg 2.5 mg 2.5 mg 2.5 mg 2.5 mg     (2.5 mg x 1)  (2.5 mg x 1)  (2.5 mg x 1)  (2.5 mg x 1)  (2.5 mg x 1)  (2.5 mg x 1)  (2.5 mg x 1)                           INR today: Therapeutic  PLAN Weekly dose was unchanged  Patient Instructions  Patient educated about medication as defined in this encounter and verbalized understanding by repeating back instructions provided.   Patient advised to contact clinic or seek medical attention if signs/symptoms of bleeding or  thromboembolism occur.  Patient verbalized understanding by repeating back information and was advised to contact me if further medication-related questions arise. Patient was also provided an information handout.  Follow-up Return in about 1 week (around 07/24/2016).  [name]

## 2016-07-31 ENCOUNTER — Other Ambulatory Visit: Payer: Self-pay

## 2016-07-31 NOTE — Telephone Encounter (Signed)
Jacqueline Nguyen requesting atenolol (TENORMIN) 25 MG tablet to be filled.

## 2016-08-01 ENCOUNTER — Telehealth: Payer: Self-pay | Admitting: *Deleted

## 2016-08-01 ENCOUNTER — Other Ambulatory Visit: Payer: Self-pay | Admitting: *Deleted

## 2016-08-01 MED ORDER — ATENOLOL 25 MG PO TABS: 25.0000 mg | ORAL_TABLET | Freq: Every day | ORAL | 3 refills | Status: DC

## 2016-08-01 NOTE — Telephone Encounter (Addendum)
4/30 pm INR 3.1 It was faxed and given to dr Maudie Mercury- the faxed copy Called value to dr Daryll Drown Dr Maudie Mercury is now away, called dr Daryll Drown and informed her orders rec'd Pt is to skip next dose of 2.21m, then resume normal daily dosing, will f/u at normal weekly INR check mon 5/8 and will f/u I ran into dr kim in the hallway, went into her office and brought her up to date on INR result and order, she agrees and will touch base with dr mDaryll Drownand pt if needed next mon

## 2016-08-01 NOTE — Telephone Encounter (Signed)
Checking magic mouthwash

## 2016-08-01 NOTE — Telephone Encounter (Signed)
Called pt, gave her instructions- she will skip today's dose then resume normal daily dosing and f/u mon 5/8 with INR check, she repeated back

## 2016-08-01 NOTE — Telephone Encounter (Signed)
Pt calls and states she got her atenolol today and it was not correct, she states she has always taken it up to 4 times daily- 63m. I could not  find notes to support this from either dr croitoru nor dr mDaryll Drown After calling pt's old pharmacy I went to care everywhere and see pt's prior pcp in [place], suzanne hedgecock had indeed ordered the atenolol that way, [facility] filled this on 11/17 and 2/18.  Please advise

## 2016-08-02 NOTE — Telephone Encounter (Signed)
Agree with plan 

## 2016-08-02 NOTE — Telephone Encounter (Signed)
It looks like it was put in wrong by a pharm tech when she was admitted to the hospital.    She can still take it that way.  Does she need more than 90 tablets?  I think all we would need to do is call in the prescription with the correct instructions.   Take 1 tab daily and TID PRN anxiety or palpitations.  #90.  Let me know if she needs more than 90 tablets and I will modify the prescription.    Thanks!

## 2016-08-04 MED ORDER — ATENOLOL 25 MG PO TABS: 25.0000 mg | ORAL_TABLET | Freq: Four times a day (QID) | ORAL | 3 refills | Status: DC | PRN

## 2016-08-04 NOTE — Telephone Encounter (Signed)
No prob!

## 2016-08-07 LAB — POCT INR: INR: 2.3

## 2016-08-08 ENCOUNTER — Telehealth (INDEPENDENT_AMBULATORY_CARE_PROVIDER_SITE_OTHER): Payer: Medicare Other | Admitting: *Deleted

## 2016-08-08 DIAGNOSIS — I82402 Acute embolism and thrombosis of unspecified deep veins of left lower extremity: Secondary | ICD-10-CM

## 2016-08-08 DIAGNOSIS — Z5181 Encounter for therapeutic drug level monitoring: Secondary | ICD-10-CM | POA: Diagnosis not present

## 2016-08-08 DIAGNOSIS — Z86711 Personal history of pulmonary embolism: Secondary | ICD-10-CM | POA: Diagnosis not present

## 2016-08-08 DIAGNOSIS — Z86718 Personal history of other venous thrombosis and embolism: Secondary | ICD-10-CM | POA: Diagnosis not present

## 2016-08-08 DIAGNOSIS — Z7901 Long term (current) use of anticoagulants: Secondary | ICD-10-CM

## 2016-08-08 DIAGNOSIS — I480 Paroxysmal atrial fibrillation: Secondary | ICD-10-CM | POA: Diagnosis not present

## 2016-08-08 NOTE — Telephone Encounter (Signed)
[place] OT

## 2016-08-10 MED ORDER — WARFARIN SODIUM 2.5 MG PO TABS: 2.5000 mg | ORAL_TABLET | Freq: Every day | ORAL | 1 refills | Status: DC

## 2016-08-10 NOTE — Patient Instructions (Signed)
Patient educated about medication as defined in this encounter and verbalized understanding by repeating back instructions provided.

## 2016-08-10 NOTE — Progress Notes (Signed)
Anticoagulation Management Jacqueline Nguyen is a 83 y.o. female who was contacted for monitoring of warfarin treatment.    Indication: Jacqueline Nguyen is a 83 y.o. female who was contacted for monitoring of warfarin treatment.  INR results faxed to [place] from [facility] using patient home meter  Indication: history of DVT and PE, paroxysmal atrial fibrillation (CHADSVASC score 7, HASBLED score 1) Duration: indefinite Supervising physician: [facility] Visit History: Patient does not report signs/symptoms of bleeding or thromboembolism  Anticoagulation Episode Summary    Current INR goal:   2.0-3.0  TTR:   100.0 % (1.7 wk)  Next INR check:   08/17/2016  INR from last check:   2.1 (07/17/2016)  Most recent INR:    2.3 (08/10/2016)  Weekly max warfarin dose:     Target end date:     INR check location:     Preferred lab:     Send INR reminders to:      Indications   DVT (deep venous thrombosis) (HCC) [I82.409] Anticoagulated on Coumadin [Z51.81 Z79.01] History of pulmonary embolism [Z86.711]       Comments:         Anticoagulation Care Providers    Provider Role Specialty Phone number   [name], MD Responsible Internal Medicine [phone]     ASSESSMENT Recent Results: The most recent result is correlated with 17.5 mg per week: Lab Results  Component Value Date   INR 2.3 08/10/2016   INR 2.1 07/17/2016   INR 2.86 02/26/2016   Anticoagulation Dosing: INR as of 08/08/2016 and Previous Warfarin Dosing Information    INR Dt INR Goal [employer] Sun Mon Tue Wed Thu Fri Sat   07/17/2016 2.1 2.0-3.0 17.5 mg 2.5 mg 2.5 mg 2.5 mg 2.5 mg 2.5 mg 2.5 mg 2.5 mg    Anticoagulation Warfarin Dose Instructions as of 08/08/2016      Total Sun Mon Tue Wed Thu Fri Sat   New Dose 17.5 mg 2.5 mg 2.5 mg 2.5 mg 2.5 mg 2.5 mg 2.5 mg 2.5 mg     (2.5 mg x 1)  (2.5 mg x 1)  (2.5 mg x 1)  (2.5 mg x 1)  (2.5 mg x 1)  (2.5 mg x 1)  (2.5 mg x 1)                           INR  today: Therapeutic  PLAN Weekly dose was unchanged   Patient Instructions  Patient educated about medication as defined in this encounter and verbalized understanding by repeating back instructions provided.   Patient advised to contact clinic or seek medical attention if signs/symptoms of bleeding or thromboembolism occur.  Patient verbalized understanding by repeating back information and was advised to contact me if further medication-related questions arise. Patient was also provided an information handout.  Follow-up Return in about 1 week (around 08/15/2016).  [name]

## 2016-08-21 LAB — POCT INR: INR: 2.5

## 2016-08-23 ENCOUNTER — Telehealth: Payer: Self-pay

## 2016-08-23 NOTE — Telephone Encounter (Signed)
Asking to speak with Bonnita Nasuti.

## 2016-08-23 NOTE — Telephone Encounter (Signed)
Pt is calling back to speak with Bonnita Nasuti. Please call back.

## 2016-08-29 ENCOUNTER — Other Ambulatory Visit: Payer: Self-pay

## 2016-08-29 NOTE — Telephone Encounter (Signed)
I spoke w/ pt today, 2 issues, pain med and hearing aids

## 2016-08-30 ENCOUNTER — Other Ambulatory Visit: Payer: Self-pay | Admitting: *Deleted

## 2016-08-30 NOTE — Telephone Encounter (Signed)
Dr Daryll Drown states she does not think pt will need a new appt for hearing aids, they had spoken of it in another visit Pain med- last script was written for #120 with directions 1 tablet 3 times daily which would end up being a 40 day supply, checked with pharmacy to confirm, pt expects it every 30 days due to the script stating the 30 day rule at the bottom. Sending refill request with note

## 2016-08-30 NOTE — Telephone Encounter (Signed)
Called pharmacy again, they state pt's son brought a script in today and it was filled so pt does not need a new script for pain med

## 2016-08-31 ENCOUNTER — Encounter: Payer: Self-pay | Admitting: Pharmacist

## 2016-08-31 DIAGNOSIS — Z7901 Long term (current) use of anticoagulants: Secondary | ICD-10-CM

## 2016-08-31 DIAGNOSIS — Z86711 Personal history of pulmonary embolism: Secondary | ICD-10-CM

## 2016-08-31 DIAGNOSIS — I82402 Acute embolism and thrombosis of unspecified deep veins of left lower extremity: Secondary | ICD-10-CM

## 2016-08-31 LAB — POCT INR: INR: 2.4

## 2016-08-31 NOTE — Progress Notes (Signed)
Anticoagulation Management Jacqueline Nguyen is a 83 y.o. female who was contacted for monitoring of warfarin treatment.  Patient uses [facility] home meter for INR check.  Indication: DVT and PE history Duration: indefinite Supervising physician: Gilles Chiquito  Anticoagulation Clinic Visit History: Patient does not report signs/symptoms of bleeding or thromboembolism   Anticoagulation Episode Summary    Current INR goal:   2.0-3.0  TTR:   100.0 % (3.3 wk)  Next INR check:   09/07/2016  INR from last check:   2.5 (08/21/2016)  Weekly max warfarin dose:     Target end date:     INR check location:     Preferred lab:     Send INR reminders to:      Indications   DVT (deep venous thrombosis) (HCC) [I82.409] Anticoagulated on Coumadin [Z51.81 Z79.01] History of pulmonary embolism [Z86.711]       Comments:         Anticoagulation Care Providers    Provider Role Specialty Phone number   [name], MD Responsible Internal Medicine [phone]     ASSESSMENT Recent Results: The most recent result is correlated with 17.5 mg per week: Lab Results  Component Value Date   INR 2.5 08/21/2016   INR 2.4 08/14/2016   INR 2.3 08/07/2016   Anticoagulation Dosing: INR as of 08/31/2016 and Previous Warfarin Dosing Information    INR Dt INR Goal [employer] Sun Mon Tue Wed Thu Fri Sat   08/21/2016 2.5 2.0-3.0 17.5 mg 2.5 mg 2.5 mg 2.5 mg 2.5 mg 2.5 mg 2.5 mg 2.5 mg    Anticoagulation Warfarin Dose Instructions as of 08/31/2016      Total Sun Mon Tue Wed Thu Fri Sat   New Dose 17.5 mg 2.5 mg 2.5 mg 2.5 mg 2.5 mg 2.5 mg 2.5 mg 2.5 mg     (2.5 mg x 1)  (2.5 mg x 1)  (2.5 mg x 1)  (2.5 mg x 1)  (2.5 mg x 1)  (2.5 mg x 1)  (2.5 mg x 1)                           INR today: Therapeutic  PLAN Weekly dose was unchanged  Patient Instructions  Patient educated about medication as defined in this encounter and verbalized understanding by repeating back instructions provided.   Patient advised  to contact clinic or seek medical attention if signs/symptoms of bleeding or thromboembolism occur.  Patient verbalized understanding by repeating back information and was advised to contact me if further medication-related questions arise. Patient was also provided an information handout.  Follow-up Return in about 1 week (around 09/07/2016).  [name]

## 2016-08-31 NOTE — Patient Instructions (Signed)
Patient educated about medication as defined in this encounter and verbalized understanding by repeating back instructions provided.   

## 2016-09-04 LAB — PROTIME-INR

## 2016-09-04 NOTE — Progress Notes (Signed)
I reviewed Dr. Julianne Rice note regarding [name].

## 2016-09-13 ENCOUNTER — Telehealth: Payer: Self-pay

## 2016-09-13 NOTE — Telephone Encounter (Signed)
Remote Cardiac monitoring called to report out of range INR 1.8

## 2016-09-13 NOTE — Telephone Encounter (Signed)
Dr. Maudie Mercury - -  Can you weigh in as well.  Would consider increasing dose to 20m for 2 days and then back to regular dosing.  What are your thoughts on this?   Thank you!

## 2016-09-15 ENCOUNTER — Encounter: Payer: Self-pay | Admitting: Pharmacist

## 2016-09-15 DIAGNOSIS — Z86711 Personal history of pulmonary embolism: Secondary | ICD-10-CM

## 2016-09-15 DIAGNOSIS — Z7901 Long term (current) use of anticoagulants: Secondary | ICD-10-CM

## 2016-09-15 DIAGNOSIS — I82402 Acute embolism and thrombosis of unspecified deep veins of left lower extremity: Secondary | ICD-10-CM

## 2016-09-15 LAB — POCT INR: INR: 1.8

## 2016-09-15 NOTE — Patient Instructions (Signed)
Patient educated about medication as defined in this encounter and verbalized understanding by repeating back instructions provided.

## 2016-09-15 NOTE — Progress Notes (Signed)
Anticoagulation Management Jacqueline Nguyen is a 83 y.o. female who was contacted for monitoring of warfarin treatment.  Patient uses [facility] home meter for INR check. Spoke with patient and her son.  Indication: DVT and PE history Duration: indefinite Supervising physician: Gilles Chiquito  Anticoagulation Clinic Visit History: Patient does not report signs/symptoms of bleeding or thromboembolism   Anticoagulation Episode Summary    Current INR goal:   2.0-3.0  TTR:   85.9 % (1.5 mo)  Next INR check:   09/18/2016  INR from last check:   1.8! (09/13/2016)  Weekly max warfarin dose:     Target end date:     INR check location:     Preferred lab:     Send INR reminders to:      Indications   DVT (deep venous thrombosis) (HCC) [I82.409] Anticoagulated on Coumadin [Z51.81 Z79.01] History of pulmonary embolism [Z86.711]       Comments:         Anticoagulation Care Providers    Provider Role Specialty Phone number   [name], MD Responsible Internal Medicine [phone]     ASSESSMENT Recent Results: The most recent result is correlated with 17.5 mg per week: Lab Results  Component Value Date   INR 1.8 09/13/2016   INR 2.5 08/21/2016   INR 2.4 08/14/2016   Anticoagulation Dosing: INR as of 09/15/2016 and Previous Warfarin Dosing Information    INR Dt INR Goal [employer] Sun Mon Tue Wed Thu Fri Sat   09/13/2016 1.8 2.0-3.0 17.5 mg 2.5 mg 2.5 mg 2.5 mg 2.5 mg 2.5 mg 2.5 mg 2.5 mg    Anticoagulation Warfarin Dose Instructions as of 09/15/2016      Total Sun Mon Tue Wed Thu Fri Sat   New Dose 20 mg 2.5 mg 2.5 mg 2.5 mg 2.5 mg 2.5 mg 5 mg 2.5 mg     (2.5 mg x 1)  (2.5 mg x 1)  (2.5 mg x 1)  (2.5 mg x 1)  (2.5 mg x 1)  (2.5 mg x 2)  (2.5 mg x 1)                           INR today: Subtherapeutic  PLAN Weekly dose was increased by 14% to 20 mg per week  Patient Instructions  Patient educated about medication as defined in this encounter and verbalized understanding  by repeating back instructions provided.   Patient advised to contact clinic or seek medical attention if signs/symptoms of bleeding or thromboembolism occur.  Patient verbalized understanding by repeating back information and was advised to contact me if further medication-related questions arise. Patient was also provided an information handout.  Follow-up Return in about 1 week (around 09/22/2016).  Jacqueline Nguyen

## 2016-09-18 ENCOUNTER — Encounter: Payer: Self-pay | Admitting: Pharmacist

## 2016-09-18 DIAGNOSIS — Z7901 Long term (current) use of anticoagulants: Secondary | ICD-10-CM

## 2016-09-18 DIAGNOSIS — I82402 Acute embolism and thrombosis of unspecified deep veins of left lower extremity: Secondary | ICD-10-CM

## 2016-09-18 DIAGNOSIS — Z86711 Personal history of pulmonary embolism: Secondary | ICD-10-CM

## 2016-09-18 LAB — POCT INR: INR: 2.9

## 2016-09-18 NOTE — Progress Notes (Signed)
Anticoagulation Management Jacqueline Nguyen is a 83 y.o. female who was contacted for monitoring of warfarin treatment.    Indication: history DVT and PE Duration: indefinite Supervising physician: [facility] Visit History: Patient does not report signs/symptoms of bleeding or thromboembolism. Last week, patient took 1 extra tablet (2.5 mg) due to INR result of 1.8.  Anticoagulation Episode Summary    Current INR goal:   2.0-3.0  TTR:   85.5 % (1.7 mo)  Next INR check:   09/25/2016  INR from last check:   2.9 (09/18/2016)  Weekly max warfarin dose:     Target end date:     INR check location:     Preferred lab:     Send INR reminders to:      Indications   DVT (deep venous thrombosis) (HCC) [I82.409] Anticoagulated on Coumadin [Z51.81 Z79.01] History of pulmonary embolism [Z86.711]       Comments:         Anticoagulation Care Providers    Provider Role Specialty Phone number   [name], MD Responsible Internal Medicine [phone]     ASSESSMENT Recent Results: Lab Results  Component Value Date   INR 2.9 09/18/2016   INR 1.8 09/13/2016   INR 2.5 08/21/2016   Anticoagulation Dosing: Anticoagulation Warfarin Dose Instructions as of 09/18/2016      Total Sun Mon Tue Wed Thu Fri Sat   New Dose 17.5 mg 2.5 mg 2.5 mg 2.5 mg 2.5 mg 2.5 mg 2.5 mg 2.5 mg     (2.5 mg x 1)  (2.5 mg x 1)  (2.5 mg x 1)  (2.5 mg x 1)  (2.5 mg x 1)  (2.5 mg x 1)  (2.5 mg x 1)     INR today: Therapeutic  PLAN Continue normal dosing of 2.5 mg (1 tablet) daily  Patient Instructions  Patient educated about medication as defined in this encounter and verbalized understanding by repeating back instructions provided.    Patient advised to contact clinic or seek medical attention if signs/symptoms of bleeding or thromboembolism occur.  Patient verbalized understanding by repeating back information and was advised to contact me if further medication-related  questions arise. Patient was also provided an information handout.  Follow-up Return in about 1 week (around 09/25/2016).  Jacqueline Nguyen

## 2016-09-18 NOTE — Patient Instructions (Signed)
Patient educated about medication as defined in this encounter and verbalized understanding by repeating back instructions provided.   

## 2016-09-19 LAB — PROTIME-INR: INR: 2

## 2016-09-20 ENCOUNTER — Ambulatory Visit (INDEPENDENT_AMBULATORY_CARE_PROVIDER_SITE_OTHER): Payer: Medicare Other | Admitting: Internal Medicine

## 2016-09-20 ENCOUNTER — Encounter: Payer: Self-pay | Admitting: Internal Medicine

## 2016-09-20 VITALS — BP 146/48 | HR 67 | Temp 97.7°F | Ht 65.0 in | Wt 250.3 lb

## 2016-09-20 DIAGNOSIS — I50812 Chronic right heart failure: Secondary | ICD-10-CM

## 2016-09-20 DIAGNOSIS — Z86718 Personal history of other venous thrombosis and embolism: Secondary | ICD-10-CM | POA: Diagnosis not present

## 2016-09-20 DIAGNOSIS — Z9981 Dependence on supplemental oxygen: Secondary | ICD-10-CM

## 2016-09-20 DIAGNOSIS — Z86711 Personal history of pulmonary embolism: Secondary | ICD-10-CM | POA: Diagnosis not present

## 2016-09-20 DIAGNOSIS — Z87891 Personal history of nicotine dependence: Secondary | ICD-10-CM

## 2016-09-20 DIAGNOSIS — M1610 Unilateral primary osteoarthritis, unspecified hip: Secondary | ICD-10-CM

## 2016-09-20 DIAGNOSIS — I480 Paroxysmal atrial fibrillation: Secondary | ICD-10-CM | POA: Diagnosis not present

## 2016-09-20 DIAGNOSIS — I2723 Pulmonary hypertension due to lung diseases and hypoxia: Secondary | ICD-10-CM | POA: Diagnosis not present

## 2016-09-20 DIAGNOSIS — Z79891 Long term (current) use of opiate analgesic: Secondary | ICD-10-CM

## 2016-09-20 DIAGNOSIS — J4489 Other specified chronic obstructive pulmonary disease: Secondary | ICD-10-CM

## 2016-09-20 DIAGNOSIS — J9610 Chronic respiratory failure, unspecified whether with hypoxia or hypercapnia: Secondary | ICD-10-CM | POA: Diagnosis not present

## 2016-09-20 DIAGNOSIS — R42 Dizziness and giddiness: Secondary | ICD-10-CM | POA: Diagnosis not present

## 2016-09-20 DIAGNOSIS — I110 Hypertensive heart disease with heart failure: Secondary | ICD-10-CM

## 2016-09-20 DIAGNOSIS — M1611 Unilateral primary osteoarthritis, right hip: Secondary | ICD-10-CM | POA: Diagnosis not present

## 2016-09-20 DIAGNOSIS — G894 Chronic pain syndrome: Secondary | ICD-10-CM | POA: Diagnosis not present

## 2016-09-20 DIAGNOSIS — I272 Pulmonary hypertension, unspecified: Secondary | ICD-10-CM

## 2016-09-20 DIAGNOSIS — Z7901 Long term (current) use of anticoagulants: Secondary | ICD-10-CM

## 2016-09-20 DIAGNOSIS — I10 Essential (primary) hypertension: Secondary | ICD-10-CM

## 2016-09-20 DIAGNOSIS — J449 Chronic obstructive pulmonary disease, unspecified: Secondary | ICD-10-CM | POA: Diagnosis not present

## 2016-09-20 DIAGNOSIS — R5381 Other malaise: Secondary | ICD-10-CM

## 2016-09-20 DIAGNOSIS — I82402 Acute embolism and thrombosis of unspecified deep veins of left lower extremity: Secondary | ICD-10-CM

## 2016-09-20 DIAGNOSIS — J9611 Chronic respiratory failure with hypoxia: Secondary | ICD-10-CM

## 2016-09-20 DIAGNOSIS — Z79899 Other long term (current) drug therapy: Secondary | ICD-10-CM

## 2016-09-20 LAB — PROTIME-INR

## 2016-09-20 MED ORDER — HYDROCODONE-ACETAMINOPHEN 10-325 MG PO TABS: 1.0000 | ORAL_TABLET | Freq: Four times a day (QID) | ORAL | 0 refills | Status: DC | PRN

## 2016-09-20 MED ORDER — MECLIZINE HCL 25 MG PO TABS: 25.0000 mg | ORAL_TABLET | Freq: Two times a day (BID) | ORAL | 3 refills | Status: DC | PRN

## 2016-09-20 NOTE — Progress Notes (Signed)
Subjective:    Patient ID: Jacqueline Nguyen, female    DOB: [birth year], 83 y.o.   MRN: [id number]  CC: 3 month follow up for chronic pain, paf  HPI  Jacqueline Nguyen is an 83yo woman with PMH of COPD/bronchitis, chronic pain, chronic respiratory failure due to obesity, OSA, COPD, Chronic rt sided heart failure, h/o DVT and PE, PAF on coumadin, mild pHTN, deconditioning who presents for follow up .   Today, Jacqueline Nguyen reports that she is doing well. She has recently purchased a scooter and feels like this is helping her QOL very much.  She notes that she is now able to get to the bathroom and refrigerator without assistance and her sons are able to do work without being worried about leaving her alone.  She also notes that she is not as worried about falling.  She continues to use a cane. No falls since last time I saw her.  She reports continued pain in the right hip and also the knees.  This limits her activity somewhat.  She has difficulty with moving the right leg due to the hip pain.  She has been told by her cardiologist that she would not survive a surgery, so she is not interested in seeing a Psychologist, sport and exercise.  She takes her hydrocodone with good results, for a normal day she takes 3 tabs, on bad days she takes 4-5 tabs. She has no reported oversedation or constipation.  She notes that her main area of pain is the right groin.  She has no lower back pain reported today, no radiation of pain down the leg.  She has knee pain in bilateral knees.    She reports that her COPD/bronchitis is well controlled.  She seems to be taking flovent and symbicort, but not at the same time. She tells me that she only uses them PRN and she has not needed them recently.  As regards her chronic respiratory failure, on O2, she is doing well with this and is not currently using her O2 while seeing me today.    She reported to me that she recently saw Dermatology and is scheduled for MOHS surgery.  I reviewed the dermatology notes in Care  Everywhere.  I reminded her to tell the surgeon that she is on coumadin.  She also recently saw ENT for hearing loss.  She was noted to have complete loss of hearing on the right, but normal TM on that side so an MRI has been ordered.    We spoke at length about vertigo.  She feels she is having worsening vertigo. This was diagnosed prior to her being my patient and she has had good success with meclizine.  She would like a refill of this medication.  She has no lightheadedness, chest pain, palpitations or passing out.    She noted issues with her narcotic Rx and these were re-written for her.     Review of Systems  Constitutional: Positive for activity change (improved with scooter). Negative for fatigue and unexpected weight change.  HENT: Positive for hearing loss. Negative for ear pain.   Respiratory: Negative for cough, shortness of breath and wheezing.   Cardiovascular: Negative for chest pain, palpitations and leg swelling.  Gastrointestinal: Negative for diarrhea.  Genitourinary: Negative for difficulty urinating.  Musculoskeletal: Positive for arthralgias (hip pain), gait problem (due to pain) and joint swelling (knees). Negative for back pain.  Skin: Negative for rash and wound.  Neurological: Positive for dizziness (she reports vertiginous symptoms)  and weakness (right leg, due to hip pain). Negative for syncope.  Psychiatric/Behavioral: Negative for decreased concentration and dysphoric mood.       Objective:   Physical Exam  Constitutional:  Elderly woman, NAD  HENT:  Head: Normocephalic and atraumatic.  Cardiovascular: Normal rate, regular rhythm and normal heart sounds.   No murmur heard. Pulmonary/Chest: Effort normal and breath sounds normal. No respiratory distress.  Abdominal: Soft. Bowel sounds are normal.  Musculoskeletal:  She has limited ROM of the left hip due to pain.  My exam was limited as she was sitting in scooter and unable to get on exam table. Due to  pain, she had to manually move her RLE, however, on strength testing she had good strength.  She notes no definitive weakness and reported no bowel or bladder issues. + TTP over right groin  Neurological: She is alert. She exhibits normal muscle tone (She had good strength based on my limited evaluation given patient sitting in scooter.  ).  Skin: Skin is warm and dry. No erythema.  Psychiatric: She has a normal mood and affect. Her behavior is normal.          Assessment & Plan:  RTC in 3 months.

## 2016-09-20 NOTE — Patient Instructions (Signed)
Jacqueline Nguyen - -   Thank you for coming in to see me today!  Please continue taking all of your medications as prescribed.   For your hip arthritis, I would recommend getting evaluated by PM&R or sports medicine.  I will place a referral for Dr. Ron Agee at [facility] orthopedics here in [place].   Please call with any questions, or if you have any further problems with your prescriptions.    Thank you!

## 2016-09-21 ENCOUNTER — Telehealth: Payer: Self-pay | Admitting: *Deleted

## 2016-09-21 NOTE — Assessment & Plan Note (Signed)
Noted to be mild on previous TTE.  She has chronic respiratory failure which is multifactorial, this being one of the factors.  She looks very well today and has no acute or new issues concerning her breathing.  Now with the scooter, she feels her exercise tolerance is actually better.   Plan Continue to monitor.

## 2016-09-21 NOTE — Assessment & Plan Note (Signed)
She has a history of DVT and PE and is on chronic coumadin with home monitoring.  Dr. Maudie Mercury our pharmacist and I monitor her home INR.  Her most recent level was at goal.   Plan Continue coumadin lifelong

## 2016-09-21 NOTE — Assessment & Plan Note (Signed)
This was a main issue for her today which we discussed at length.  Her main arthritis pain is in the hip.  She has subsequent knee and ankle pain worse on the right.  She has no apparent weakness on my exam, but definite limited motion and ability to move the leg due to pain.  She told me she has had imaging before, but there are none in our system.  She reports no back pain today, or bowel/bladder issues.    She has been told by her cardiologist that she would not do well with surgery, but she is interested in possible injectable therapy.  She has done PT with some improvement in strength.  We discussed PM&R referral, and she is interested today.   Plan PM&R referral

## 2016-09-21 NOTE — Assessment & Plan Note (Signed)
She reports having this history for a long time and recently it was worse.  She is due to have MRI brain soon to evaluate her inner ear/acoustic neuroma.    Plan Refill meclizine.

## 2016-09-21 NOTE — Telephone Encounter (Signed)
If pharmacy is willing to fill, she can have it.

## 2016-09-21 NOTE — Assessment & Plan Note (Signed)
Improved today.  She uses oxygen therapy at night and sometimes during exertion.  She did not have it with her today.    Continue oxygen therapy, therapy for chronic bronchitis.

## 2016-09-21 NOTE — Assessment & Plan Note (Signed)
She is doing well today.  She has chronic respiratory failure and this is one of the components.  She uses oxygen.  She only uses her symbicort and flovent PRN.  We did not discuss this at length, but we will discuss at next visit.  For now, continue current therapy.

## 2016-09-21 NOTE — Assessment & Plan Note (Signed)
She uses chronic pain medications, hydrocodone-apap daily.  She is using TID and sometimes up to 4 times per day.  Her MME is 30-40 per day which is below 50.   Reviewed [place] narcotic database today which was appropriate.   Last UDS in Dec of last year was appropriate.   Refill X 3 provided.

## 2016-09-21 NOTE — Telephone Encounter (Signed)
[facility] calls and states pt wanted her hydrocodone 10/325 filled today, it was last filled 5/30, it would be 8 days early, do you want her to get this today or wait the full 30 days for refill, please advise

## 2016-09-21 NOTE — Progress Notes (Signed)
I have reviewed Dr. Julianne Rice notes and saw [name] today in clinic as well.

## 2016-09-21 NOTE — Assessment & Plan Note (Signed)
As regards this issue, she is doing very well. She has limited edema on exam and her breathing status is improved since the last time I saw her.  She is on atenolol, diltiazem and lasix as needed.   Plan Continue Cardiology follow up as scheduled Continue lasix

## 2016-09-21 NOTE — Assessment & Plan Note (Signed)
Her BP on first check was 157/45.  On recheck, her systolic was 782. She has home readings of 423 - 536 systolic.  She has low diastolic pressures which concerns her and we discussed this at some length today.  Increased pulse pressure is expected at her age, however, significantly elevated pressure can put increased stress on the large vessels.  I am inclined to leave her blood pressure regimen as is to not further lower her diastolic pressure. She reports that her cardiologist has also expressed concern in this arena.   Plan Continue diltiazem, atenolol, lasix

## 2016-09-21 NOTE — Assessment & Plan Note (Signed)
She is now using a scooter and feels this is helping her.

## 2016-09-21 NOTE — Assessment & Plan Note (Signed)
HR is regular today.  She is taking two rate controlling medications and she reports that she has been on them for a while.  No recent palpitations.   Plan Continue coumadin, atenolol, diltiazem

## 2016-09-25 NOTE — Telephone Encounter (Signed)
Reconfirmed w/ pharm, they will fill 6/29

## 2016-10-02 ENCOUNTER — Encounter: Payer: Self-pay | Admitting: Pharmacist

## 2016-10-02 DIAGNOSIS — Z86711 Personal history of pulmonary embolism: Secondary | ICD-10-CM

## 2016-10-02 DIAGNOSIS — Z7901 Long term (current) use of anticoagulants: Secondary | ICD-10-CM

## 2016-10-02 DIAGNOSIS — I82402 Acute embolism and thrombosis of unspecified deep veins of left lower extremity: Secondary | ICD-10-CM

## 2016-10-02 LAB — POCT INR: INR: 2.8

## 2016-10-02 NOTE — Patient Instructions (Signed)
Patient educated about medication as defined in this encounter and verbalized understanding by repeating back instructions provided.

## 2016-10-02 NOTE — Progress Notes (Signed)
I reviewed Dr. Julianne Rice documentation.  INR at goal.

## 2016-10-02 NOTE — Progress Notes (Signed)
Anticoagulation Management Sanaia Jasso is a 83 y.o. female who reports to the clinic for monitoring of warfarin treatment.    Indication: DVT and PE history Duration: indefinite Supervising physician: Gilles Chiquito  Anticoagulation Clinic Visit History: Patient does not report signs/symptoms of bleeding or thromboembolism   Anticoagulation Episode Summary    Current INR goal:   2.0-3.0  TTR:   84.1 % (2.2 mo)  Next INR check:   10/09/2016  INR from last check:   2.8 (10/02/2016)  Weekly max warfarin dose:     Target end date:     INR check location:     Preferred lab:     Send INR reminders to:      Indications   DVT (deep venous thrombosis) (HCC) [I82.409] Anticoagulated on Coumadin [Z51.81 Z79.01] History of pulmonary embolism [Z86.711]       Comments:         Anticoagulation Care Providers    Provider Role Specialty Phone number   [name], MD Responsible Internal Medicine [phone]     ASSESSMENT Recent Results: The most recent result is correlated with 17.5 mg per week: Lab Results  Component Value Date   INR 2.8 10/02/2016   INR 2.9 09/18/2016   INR 1.8 09/13/2016   Anticoagulation Dosing: INR as of 10/02/2016 and Previous Warfarin Dosing Information    INR Dt INR Goal [employer] Sun Mon Tue Wed Thu Fri Sat   10/02/2016 2.8 2.0-3.0 17.5 mg 2.5 mg 2.5 mg 2.5 mg 2.5 mg 2.5 mg 2.5 mg 2.5 mg    Anticoagulation Warfarin Dose Instructions as of 10/02/2016      Total Sun Mon Tue Wed Thu Fri Sat   New Dose 17.5 mg 2.5 mg 2.5 mg 2.5 mg 2.5 mg 2.5 mg 2.5 mg 2.5 mg     (2.5 mg x 1)  (2.5 mg x 1)  (2.5 mg x 1)  (2.5 mg x 1)  (2.5 mg x 1)  (2.5 mg x 1)  (2.5 mg x 1)                           INR today: Therapeutic  PLAN Weekly dose was unchanged   Patient Instructions  Patient educated about medication as defined in this encounter and verbalized understanding by repeating back instructions provided.   Patient advised to contact clinic or seek medical  attention if signs/symptoms of bleeding or thromboembolism occur.  Patient verbalized understanding by repeating back information and was advised to contact me if further medication-related questions arise. Patient was also provided an information handout.  Follow-up Return in about 1 week (around 10/09/2016).  [name]

## 2016-10-06 LAB — PROTIME-INR

## 2016-10-23 ENCOUNTER — Encounter: Payer: Self-pay | Admitting: Pharmacist

## 2016-10-23 DIAGNOSIS — Z86711 Personal history of pulmonary embolism: Secondary | ICD-10-CM

## 2016-10-23 DIAGNOSIS — Z7901 Long term (current) use of anticoagulants: Secondary | ICD-10-CM

## 2016-10-23 DIAGNOSIS — I82402 Acute embolism and thrombosis of unspecified deep veins of left lower extremity: Secondary | ICD-10-CM

## 2016-10-23 LAB — POCT INR: INR: 2.4

## 2016-10-24 NOTE — Progress Notes (Signed)
Anticoagulation Management Jacqueline Nguyen is a 83 y.o. female who reports to the clinic for monitoring of warfarin treatment.    Indication: DVT and PE history Duration: indefinite Supervising physician: Gilles Chiquito  Anticoagulation Clinic Visit History: Patient does not report signs/symptoms of bleeding or thromboembolism   Anticoagulation Episode Summary    Current INR goal:   2.0-3.0  TTR:   88.0 % (2.9 mo)  Next INR check:   10/30/2016  INR from last check:   2.4 (10/23/2016)  Weekly max warfarin dose:     Target end date:     INR check location:     Preferred lab:     Send INR reminders to:      Indications   DVT (deep venous thrombosis) (HCC) [I82.409] Anticoagulated on Coumadin [Z51.81 Z79.01] History of pulmonary embolism [Z86.711]       Comments:         Anticoagulation Care Providers    Provider Role Specialty Phone number   [name], MD Responsible Internal Medicine [phone]     ASSESSMENT Recent Results: The most recent result is correlated with 17.5 mg per week: Lab Results  Component Value Date   INR 2.4 10/23/2016   INR 2.8 10/02/2016   INR 2.9 09/18/2016   Anticoagulation Dosing: INR as of 10/23/2016 and Previous Warfarin Dosing Information    INR Dt INR Goal [employer] Sun Mon Tue Wed Thu Fri Sat   10/23/2016 2.4 2.0-3.0 17.5 mg 2.5 mg 2.5 mg 2.5 mg 2.5 mg 2.5 mg 2.5 mg 2.5 mg    Anticoagulation Warfarin Dose Instructions as of 10/23/2016      Total Sun Mon Tue Wed Thu Fri Sat   New Dose 17.5 mg 2.5 mg 2.5 mg 2.5 mg 2.5 mg 2.5 mg 2.5 mg 2.5 mg     (2.5 mg x 1)  (2.5 mg x 1)  (2.5 mg x 1)  (2.5 mg x 1)  (2.5 mg x 1)  (2.5 mg x 1)  (2.5 mg x 1)                           INR today: Therapeutic  PLAN Weekly dose was unchanged  Patient Instructions  Patient educated about medication as defined in this encounter and verbalized understanding by repeating back instructions provided.   Patient advised to contact clinic or seek  medical attention if signs/symptoms of bleeding or thromboembolism occur.  Patient verbalized understanding by repeating back information and was advised to contact me if further medication-related questions arise. Patient was also provided an information handout.  Follow-up Return in about 1 week (around 10/30/2016).  [name]

## 2016-10-24 NOTE — Patient Instructions (Signed)
Patient educated about medication as defined in this encounter and verbalized understanding by repeating back instructions provided.

## 2016-10-25 ENCOUNTER — Encounter: Payer: Self-pay | Admitting: Pharmacist

## 2016-10-26 LAB — PROTIME-INR

## 2016-10-26 NOTE — Progress Notes (Signed)
I reviewed Dr. Julianne Rice note. Patient INR is at goal.  Dose unchanged.

## 2016-11-06 ENCOUNTER — Other Ambulatory Visit: Payer: Self-pay | Admitting: Internal Medicine

## 2016-11-06 DIAGNOSIS — I480 Paroxysmal atrial fibrillation: Secondary | ICD-10-CM

## 2016-11-06 DIAGNOSIS — I82402 Acute embolism and thrombosis of unspecified deep veins of left lower extremity: Secondary | ICD-10-CM

## 2016-11-06 LAB — POCT INR: INR: 1.8

## 2016-11-07 ENCOUNTER — Telehealth: Payer: Self-pay | Admitting: *Deleted

## 2016-11-07 NOTE — Telephone Encounter (Signed)
Jacqueline Nguyen, remote services calls and reports pt's INR done Monday 8/6 at 1749 was 1.8

## 2016-11-08 ENCOUNTER — Other Ambulatory Visit (INDEPENDENT_AMBULATORY_CARE_PROVIDER_SITE_OTHER): Payer: Medicare Other | Admitting: *Deleted

## 2016-11-08 DIAGNOSIS — Z7901 Long term (current) use of anticoagulants: Secondary | ICD-10-CM

## 2016-11-08 DIAGNOSIS — Z86711 Personal history of pulmonary embolism: Secondary | ICD-10-CM

## 2016-11-08 DIAGNOSIS — I480 Paroxysmal atrial fibrillation: Secondary | ICD-10-CM

## 2016-11-08 DIAGNOSIS — Z5181 Encounter for therapeutic drug level monitoring: Secondary | ICD-10-CM | POA: Diagnosis not present

## 2016-11-08 DIAGNOSIS — I82402 Acute embolism and thrombosis of unspecified deep veins of left lower extremity: Secondary | ICD-10-CM | POA: Diagnosis not present

## 2016-11-08 MED ORDER — WARFARIN SODIUM 2.5 MG PO TABS: 2.5000 mg | ORAL_TABLET | Freq: Every day | ORAL | 2 refills | Status: DC

## 2016-11-08 NOTE — Patient Instructions (Signed)
Patient educated about medication as defined in this encounter and verbalized understanding by repeating back instructions provided.

## 2016-11-08 NOTE — Progress Notes (Signed)
Anticoagulation Management Jacqueline Nguyen a 83 y.o.femalewho reports to the clinic for monitoring of warfarintreatment.   Indication: DVT and PEhistory Duration: indefinite Supervising physician: Gilles Chiquito  Anticoagulation Clinic Visit History: Patient does notreport signs/symptoms of bleeding or thromboembolism   Anticoagulation Episode Summary    Current INR goal:   2.0-3.0  TTR:   84.6 % (3.4 mo)  Next INR check:   11/08/2016  INR from last check:   1.8! (11/08/2016)  Weekly max warfarin dose:     Target end date:     INR check location:     Preferred lab:     Send INR reminders to:      Indications   DVT (deep venous thrombosis) (HCC) [I82.409] Anticoagulated on Coumadin [Z51.81 Z79.01] History of pulmonary embolism [Z86.711]       Comments:         Anticoagulation Care Providers    Provider Role Specialty Phone number   [name], MD Responsible Internal Medicine [phone]      Allergies  Allergen Reactions  . Ciprofloxacin   . Bactrim Rash  . Codeine Nausea Only   Medication Sig  acetic acid-hydrocortisone (VOSOL-HC) otic solution Place 3 drops into the right ear 3 (three) times daily. For seven days.  Ascorbic Acid (VITAMIN C PO) Take 1 capsule by mouth daily.  atenolol (TENORMIN) 25 MG tablet Take 1 tablet (25 mg total) by mouth 4 (four) times daily as needed. MAY TAKE 1 TABLET 4 TIMES DAILY AS NEEDED.  AZOPT 1 % ophthalmic suspension Place 1 drop into both eyes 2 (two) times daily.  budesonide-formoterol (SYMBICORT) 160-4.5 MCG/ACT inhaler Inhale 2 puffs into the lungs 2 (two) times daily.  Cholecalciferol (VITAMIN D PO) Take 1 capsule by mouth daily.  COMBIGAN 0.2-0.5 % ophthalmic solution Place 1 drop into both eyes 2 (two) times daily.  cyanocobalamin 1000 MCG tablet Take 1,000 mcg by mouth daily.  diltiazem (CARDIZEM CD) 120 MG 24 hr capsule TAKE ONE (1) CAPSULE BY MOUTH 2 TIMES DAILY <PLEASE MAKE APPOINTMENT FOR REFILLS>   DM-Doxylamine-Acetaminophen (NYQUIL COLD & FLU PO) Take 10 mLs by mouth daily as needed (mucus).  FLOVENT HFA 44 MCG/ACT inhaler INHALE 1 PUFF TWICE A DAY DAILY  furosemide (LASIX) 20 MG tablet Take 1 tablet (20 mg total) by mouth daily.  Homeopathic Products (ARNICARE ARNICA) CREA Apply 1 application topically daily.   HYDROcodone-acetaminophen (NORCO) 10-325 MG tablet Take 1 tablet by mouth every 6 (six) hours as needed for severe pain.  levalbuterol (XOPENEX) 0.63 MG/3ML nebulizer solution Take 1 ampule by nebulization every 8 (eight) hours as needed for wheezing.  magic mouthwash w/lidocaine SOLN Take 5 mLs by mouth 3 (three) times daily as needed for mouth pain.  meclizine (ANTIVERT) 25 MG tablet Take 1 tablet (25 mg total) by mouth 2 (two) times daily as needed for dizziness. TAKE ONE TABLET 2-3 TIMES A DAY AS NEEDED FOR VERTIGO  Multiple Vitamin (MULTIVITAMIN WITH MINERALS) TABS tablet Take 1 tablet by mouth daily.  NON FORMULARY Place 4 L into the nose at bedtime. 3 liter as needed during day depending on extertion   NON FORMULARY Apply 1 application topically daily as needed. [facility] Herbal Heal Ointment for damaged skin  NON FORMULARY Take 1-2 sprays by mouth daily as needed. Mouth Kote Dry Mouth Spray  Nystatin (NYAMYC) 100000 UNIT/GM POWD Apply topically.  OVER THE COUNTER MEDICATION "Calmer" sleep aid - Take 1 tablet by mouth daily as needed for sleep.  Respiratory Therapy  Supplies (FLUTTER) DEVI Blow through 4 times per set, three sets daily  warfarin (COUMADIN) 2.5 MG tablet Take 1 tablet (2.5 mg total) by mouth daily.   Past Medical History:  Diagnosis Date  . Arthritis   . Cataract   . Chronic respiratory failure ([place])   . Endometriosis   . HTN (hypertension)   . Hx of echocardiogram    Echo (8/15):  Mild LVH, EF 65%, no RWMA, Ao sclerosis, no AS, mod MAC, mild LAE, normal RVSF, PASP 40 mmHg  . Hx pulmonary embolism    on chronic coumadin  . Obesities,  morbid ([place])   . OSA (obstructive sleep apnea)   . Osteoporosis   . PAF (paroxysmal atrial fibrillation) ([place])    Event Monitor (8/15):  Frequent PACs, brief bursts of nonsustained ATach; no AFib  . Psoriasis   . Skin cancer   . Vertigo    Social History   Social History  . Marital status: Widowed    Spouse name: N/A  . Number of children: N/A  . Years of education: N/A   Social History Main Topics  . Smoking status: Former Smoker    Packs/day: 1.00    Years: 2.00    Types: Cigarettes    Quit date: 04/03/1981  . Smokeless tobacco: Never Used  . Alcohol use No  . Drug use: No  . Sexual activity: Not Currently   Other Topics Concern  . Not on file   Social History Narrative   She lives with her sister in [place] and requires assistance at home. Has an aid who visits and helps at home often. Son works as a Catering manager and also visits and helps when he can. Semi-independent in ADLs, requires walker around home, dependent in IADLs.   Family History  Problem Relation Age of Onset  . Heart attack Father   . Heart attack Brother   . Stroke Neg Hx    ASSESSMENT Recent Results: Lab Results  Component Value Date   INR 1.8 11/08/2016   INR 2.4 10/23/2016   INR 2.8 10/02/2016   Anticoagulation Dosing: INR as of 11/08/2016 and Previous Warfarin Dosing Information    INR Dt INR Goal [employer] Sun Mon Tue Wed Thu Fri Sat   11/08/2016 1.8 2.0-3.0 17.5 mg 2.5 mg 2.5 mg 2.5 mg 2.5 mg 2.5 mg 2.5 mg 2.5 mg    Anticoagulation Warfarin Dose Instructions as of 11/08/2016      Total Sun Mon Tue Wed Thu Fri Sat   New Dose 17.5 mg 2.5 mg 2.5 mg 2.5 mg 2.5 mg 2.5 mg 2.5 mg 2.5 mg     (2.5 mg x 1)  (2.5 mg x 1)  (2.5 mg x 1)  (2.5 mg x 1)  (2.5 mg x 1)  (2.5 mg x 1)  (2.5 mg x 1)                         Description   1.5 tablet x 1 dose today 11/08/16     INR today: Subtherapeutic  PLAN 1.5 tablet x 1 dose, then return to 1 tablet daily  Patient Instructions  Patient  educated about medication as defined in this encounter and verbalized understanding by repeating back instructions provided.   Patient advised to contact clinic or seek medical attention if signs/symptoms of bleeding or thromboembolism occur.  Patient verbalized understanding by repeating back information and was advised to contact me if further medication-related  questions arise. Patient was also provided an information handout.  Follow-up Return in about 5 days (around 11/13/2016).  [name]

## 2016-11-08 NOTE — Telephone Encounter (Signed)
No problem, spoke with patient today

## 2016-11-15 ENCOUNTER — Telehealth (INDEPENDENT_AMBULATORY_CARE_PROVIDER_SITE_OTHER): Payer: Medicare Other | Admitting: *Deleted

## 2016-11-15 DIAGNOSIS — Z7901 Long term (current) use of anticoagulants: Secondary | ICD-10-CM | POA: Diagnosis not present

## 2016-11-15 DIAGNOSIS — I480 Paroxysmal atrial fibrillation: Secondary | ICD-10-CM | POA: Diagnosis not present

## 2016-11-15 DIAGNOSIS — Z5181 Encounter for therapeutic drug level monitoring: Secondary | ICD-10-CM | POA: Diagnosis not present

## 2016-11-15 DIAGNOSIS — Z86711 Personal history of pulmonary embolism: Secondary | ICD-10-CM

## 2016-11-15 LAB — POCT INR: INR: 2

## 2016-11-15 NOTE — Telephone Encounter (Signed)
INR: 2.0 Test date: 11/13/16 Appt w/ pcp 12/06/16

## 2016-11-15 NOTE — Progress Notes (Signed)
Anticoagulation Management Jacqueline Nguyen a 83 y.o.femalewho reports to the clinic for monitoring of warfarintreatment.   Indication: DVT and PEhistory, paroxysmal atrial fibrillation Duration: indefinite Supervising physician: Gilles Chiquito  Anticoagulation Clinic Visit History: Patient does notreport signs/symptoms of bleeding or thromboembolism   Anticoagulation Episode Summary    Current INR goal:   2.0-3.0  TTR:   78.0 % (3.6 mo)  Next INR check:   11/20/2016  INR from last check:   2.0 (11/15/2016)  Weekly max warfarin dose:     Target end date:     INR check location:     Preferred lab:     Send INR reminders to:      Indications   DVT (deep venous thrombosis) (HCC) [I82.409] Anticoagulated on Coumadin [Z51.81 Z79.01] History of pulmonary embolism [Z86.711]       Comments:         Anticoagulation Care Providers    Provider Role Specialty Phone number   [name], MD Responsible Internal Medicine [phone]     Allergies  Allergen Reactions  . Ciprofloxacin   . Bactrim Rash  . Codeine Nausea Only   Medication Sig  acetic acid-hydrocortisone (VOSOL-HC) otic solution Place 3 drops into the right ear 3 (three) times daily. For seven days.  Ascorbic Acid (VITAMIN C PO) Take 1 capsule by mouth daily.  atenolol (TENORMIN) 25 MG tablet Take 1 tablet (25 mg total) by mouth 4 (four) times daily as needed. MAY TAKE 1 TABLET 4 TIMES DAILY AS NEEDED.  AZOPT 1 % ophthalmic suspension Place 1 drop into both eyes 2 (two) times daily.  budesonide-formoterol (SYMBICORT) 160-4.5 MCG/ACT inhaler Inhale 2 puffs into the lungs 2 (two) times daily.  Cholecalciferol (VITAMIN D PO) Take 1 capsule by mouth daily.  COMBIGAN 0.2-0.5 % ophthalmic solution Place 1 drop into both eyes 2 (two) times daily.  cyanocobalamin 1000 MCG tablet Take 1,000 mcg by mouth daily.  diltiazem (CARDIZEM CD) 120 MG 24 hr capsule TAKE ONE (1) CAPSULE BY MOUTH 2 TIMES DAILY <PLEASE MAKE  APPOINTMENT FOR REFILLS>  DM-Doxylamine-Acetaminophen (NYQUIL COLD & FLU PO) Take 10 mLs by mouth daily as needed (mucus).  FLOVENT HFA 44 MCG/ACT inhaler INHALE 1 PUFF TWICE A DAY DAILY  furosemide (LASIX) 20 MG tablet Take 1 tablet (20 mg total) by mouth daily.  Homeopathic Products ([employer]) CREA Apply 1 application topically daily.   HYDROcodone-acetaminophen (NORCO) 10-325 MG tablet Take 1 tablet by mouth every 6 (six) hours as needed for severe pain.  levalbuterol (XOPENEX) 0.63 MG/3ML nebulizer solution Take 1 ampule by nebulization every 8 (eight) hours as needed for wheezing.  magic mouthwash w/lidocaine SOLN Take 5 mLs by mouth 3 (three) times daily as needed for mouth pain.  meclizine (ANTIVERT) 25 MG tablet Take 1 tablet (25 mg total) by mouth 2 (two) times daily as needed for dizziness. TAKE ONE TABLET 2-3 TIMES A DAY AS NEEDED FOR VERTIGO  Multiple Vitamin (MULTIVITAMIN WITH MINERALS) TABS tablet Take 1 tablet by mouth daily.  NON FORMULARY Place 4 L into the nose at bedtime. 3 liter as needed during day depending on extertion   NON FORMULARY Apply 1 application topically daily as needed. [facility] Herbal Heal Ointment for damaged skin  NON FORMULARY Take 1-2 sprays by mouth daily as needed. Mouth Kote Dry Mouth Spray  Nystatin (NYAMYC) 100000 UNIT/GM POWD Apply topically.  OVER THE COUNTER MEDICATION "Calmer" sleep aid - Take 1 tablet by mouth daily as needed for sleep.  Respiratory Therapy Supplies (FLUTTER) DEVI Blow through 4 times per set, three sets daily  warfarin (COUMADIN) 2.5 MG tablet Take 1 tablet (2.5 mg total) by mouth daily.   Past Medical History:  Diagnosis Date  . Arthritis   . Cataract   . Chronic respiratory failure ([place])   . Endometriosis   . HTN (hypertension)   . Hx of echocardiogram    Echo (8/15):  Mild LVH, EF 65%, no RWMA, Ao sclerosis, no AS, mod MAC, mild LAE, normal RVSF, PASP 40 mmHg  . Hx pulmonary embolism    on chronic  coumadin  . Obesities, morbid ([place])   . OSA (obstructive sleep apnea)   . Osteoporosis   . PAF (paroxysmal atrial fibrillation) ([place])    Event Monitor (8/15):  Frequent PACs, brief bursts of nonsustained ATach; no AFib  . Psoriasis   . Skin cancer   . Vertigo    Social History   Social History  . Marital status: Widowed    Spouse name: N/A  . Number of children: N/A  . Years of education: N/A   Social History Main Topics  . Smoking status: Former Smoker    Packs/day: 1.00    Years: 2.00    Types: Cigarettes    Quit date: 04/03/1981  . Smokeless tobacco: Never Used  . Alcohol use No  . Drug use: No  . Sexual activity: Not Currently   Other Topics Concern  . Not on file   Social History Narrative   She lives with her sister in [place] and requires assistance at home. Has an aid who visits and helps at home often. Son works as a Catering manager and also visits and helps when he can. Semi-independent in ADLs, requires walker around home, dependent in IADLs.   Family History  Problem Relation Age of Onset  . Heart attack Father   . Heart attack Brother   . Stroke Neg Hx    ASSESSMENT Recent Results: Lab Results  Component Value Date   INR 2.0 11/15/2016   INR 1.8 11/06/2016   INR 2.4 10/23/2016   Anticoagulation Dosing: INR as of 11/15/2016 and Previous Warfarin Dosing Information    INR Dt INR Goal [employer] Sun Mon Tue Wed Thu Fri Sat   11/15/2016 2.0 2.0-3.0 17.5 mg 2.5 mg 2.5 mg 2.5 mg 2.5 mg 2.5 mg 2.5 mg 2.5 mg    Previous description   1.5 tablet x 1 dose today 11/08/16   Anticoagulation Warfarin Dose Instructions as of 11/15/2016      Total Sun Mon Tue Wed Thu Fri Sat   New Dose 17.5 mg 2.5 mg 2.5 mg 2.5 mg 2.5 mg 2.5 mg 2.5 mg 2.5 mg     (2.5 mg x 1)  (2.5 mg x 1)  (2.5 mg x 1)  (2.5 mg x 1)  (2.5 mg x 1)  (2.5 mg x 1)  (2.5 mg x 1)                         Description   1.5 tablet x 1 dose today 11/08/16     INR today:  Therapeutic  PLAN Weekly dose was unchanged   Patient Instructions  Patient educated about medication as defined in this encounter and verbalized understanding by repeating back instructions provided.    Patient advised to contact clinic or seek medical attention if signs/symptoms of bleeding or thromboembolism occur.  Patient verbalized understanding by repeating back  information and was advised to contact me if further medication-related questions arise. Patient was also provided an information handout.  Follow-up 1 week (around 11/22/2016).  [name]

## 2016-11-15 NOTE — Telephone Encounter (Signed)
Reviewed.  Will ask Dr. Maudie Mercury if any changes should be made?  Thank you!

## 2016-11-15 NOTE — Telephone Encounter (Signed)
We can continue the same. Called patient and let her know.  Thank you!

## 2016-11-15 NOTE — Patient Instructions (Signed)
Patient educated about medication as defined in this encounter and verbalized understanding by repeating back instructions provided.   

## 2016-11-15 NOTE — Addendum Note (Signed)
Addended by: Forde Dandy on: 11/15/2016 05:17 PM   Modules accepted: Orders, Level of Service

## 2016-11-22 LAB — PROTIME-INR

## 2016-11-27 LAB — POCT INR: INR: 3

## 2016-12-01 ENCOUNTER — Encounter: Payer: Self-pay | Admitting: Pharmacist

## 2016-12-01 DIAGNOSIS — Z7901 Long term (current) use of anticoagulants: Secondary | ICD-10-CM

## 2016-12-01 DIAGNOSIS — Z86711 Personal history of pulmonary embolism: Secondary | ICD-10-CM

## 2016-12-01 NOTE — Progress Notes (Addendum)
Anticoagulation Management Jacqueline Nguyen a 83 y.o.femalewho was contacted for monitoring of warfarintreatment.   Indication: DVT and PEhistory Duration: indefinite Supervising physician: Gilles Chiquito  Anticoagulation Clinic Visit History: Patient does notreport signs/symptoms of bleeding or thromboembolism   Anticoagulation Episode Summary    Current INR goal:   2.0-3.0  TTR:   80.8 % (4.2 mo)  Next INR check:   12/04/2016  INR from last check:   3.0 (11/27/2016)  Weekly max warfarin dose:     Target end date:     INR check location:     Preferred lab:     Send INR reminders to:      Indications   DVT (deep venous thrombosis) (HCC) [I82.409] Anticoagulated on Coumadin [Z51.81 Z79.01] History of pulmonary embolism [Z86.711]       Comments:         Anticoagulation Care Providers    Provider Role Specialty Phone number   [name], MD Responsible Internal Medicine [phone]     Allergies  Allergen Reactions  . Ciprofloxacin   . Bactrim Rash  . Codeine Nausea Only   Medication Sig  acetic acid-hydrocortisone (VOSOL-HC) otic solution Place 3 drops into the right ear 3 (three) times daily. For seven days.  Ascorbic Acid (VITAMIN C PO) Take 1 capsule by mouth daily.  atenolol (TENORMIN) 25 MG tablet Take 1 tablet (25 mg total) by mouth 4 (four) times daily as needed. MAY TAKE 1 TABLET 4 TIMES DAILY AS NEEDED.  AZOPT 1 % ophthalmic suspension Place 1 drop into both eyes 2 (two) times daily.  budesonide-formoterol (SYMBICORT) 160-4.5 MCG/ACT inhaler Inhale 2 puffs into the lungs 2 (two) times daily.  Cholecalciferol (VITAMIN D PO) Take 1 capsule by mouth daily.  COMBIGAN 0.2-0.5 % ophthalmic solution Place 1 drop into both eyes 2 (two) times daily.  cyanocobalamin 1000 MCG tablet Take 1,000 mcg by mouth daily.  diltiazem (CARDIZEM CD) 120 MG 24 hr capsule TAKE ONE (1) CAPSULE BY MOUTH 2 TIMES DAILY <PLEASE MAKE APPOINTMENT FOR REFILLS>   DM-Doxylamine-Acetaminophen (NYQUIL COLD & FLU PO) Take 10 mLs by mouth daily as needed (mucus).  FLOVENT HFA 44 MCG/ACT inhaler INHALE 1 PUFF TWICE A DAY DAILY  furosemide (LASIX) 20 MG tablet Take 1 tablet (20 mg total) by mouth daily.  Homeopathic Products ([employer]) CREA Apply 1 application topically daily.   HYDROcodone-acetaminophen (NORCO) 10-325 MG tablet Take 1 tablet by mouth every 6 (six) hours as needed for severe pain.  levalbuterol (XOPENEX) 0.63 MG/3ML nebulizer solution Take 1 ampule by nebulization every 8 (eight) hours as needed for wheezing.  magic mouthwash w/lidocaine SOLN Take 5 mLs by mouth 3 (three) times daily as needed for mouth pain.  meclizine (ANTIVERT) 25 MG tablet Take 1 tablet (25 mg total) by mouth 2 (two) times daily as needed for dizziness. TAKE ONE TABLET 2-3 TIMES A DAY AS NEEDED FOR VERTIGO  Multiple Vitamin (MULTIVITAMIN WITH MINERALS) TABS tablet Take 1 tablet by mouth daily.  NON FORMULARY Place 4 L into the nose at bedtime. 3 liter as needed during day depending on extertion   NON FORMULARY Apply 1 application topically daily as needed. [facility] Herbal Heal Ointment for damaged skin  NON FORMULARY Take 1-2 sprays by mouth daily as needed. Mouth Kote Dry Mouth Spray  Nystatin (NYAMYC) 100000 UNIT/GM POWD Apply topically.  OVER THE COUNTER MEDICATION "Calmer" sleep aid - Take 1 tablet by mouth daily as needed for sleep.  Respiratory Therapy Supplies (FLUTTER) DEVI  Blow through 4 times per set, three sets daily  warfarin (COUMADIN) 2.5 MG tablet Take 1 tablet (2.5 mg total) by mouth daily.   Past Medical History:  Diagnosis Date  . Arthritis   . Cataract   . Chronic respiratory failure ([place])   . Endometriosis   . HTN (hypertension)   . Hx of echocardiogram    Echo (8/15):  Mild LVH, EF 65%, no RWMA, Ao sclerosis, no AS, mod MAC, mild LAE, normal RVSF, PASP 40 mmHg  . Hx pulmonary embolism    on chronic coumadin  . Obesities,  morbid ([place])   . OSA (obstructive sleep apnea)   . Osteoporosis   . PAF (paroxysmal atrial fibrillation) ([place])    Event Monitor (8/15):  Frequent PACs, brief bursts of nonsustained ATach; no AFib  . Psoriasis   . Skin cancer   . Vertigo    Social History   Social History  . Marital status: Widowed    Spouse name: N/A  . Number of children: N/A  . Years of education: N/A   Social History Main Topics  . Smoking status: Former Smoker    Packs/day: 1.00    Years: 2.00    Types: Cigarettes    Quit date: 04/03/1981  . Smokeless tobacco: Never Used  . Alcohol use No  . Drug use: No  . Sexual activity: Not Currently   Other Topics Concern  . Not on file   Social History Narrative   She lives with her sister in [place] and requires assistance at home. Has an aid who visits and helps at home often. Son works as a Catering manager and also visits and helps when he can. Semi-independent in ADLs, requires walker around home, dependent in IADLs.   Family History  Problem Relation Age of Onset  . Heart attack Father   . Heart attack Brother   . Stroke Neg Hx    ASSESSMENT Recent Results: Lab Results  Component Value Date   INR 3.0 12/01/2016   INR 2.0 11/15/2016   INR 1.8 11/06/2016   Anticoagulation Dosing: INR as of 12/01/2016 and Previous Warfarin Dosing Information    INR Dt INR Goal [employer] Sun Mon Tue Wed Thu Fri Sat   12/01/2016 3.0 2.0-3.0 17.5 mg 2.5 mg 2.5 mg 2.5 mg 2.5 mg 2.5 mg 2.5 mg 2.5 mg    Previous description   1.5 tablet x 1 dose today 11/08/16   Anticoagulation Warfarin Dose Instructions as of 12/01/2016      Total Sun Mon Tue Wed Thu Fri Sat   New Dose 17.5 mg 2.5 mg 2.5 mg 2.5 mg 2.5 mg 2.5 mg 2.5 mg 2.5 mg     (2.5 mg x 1)  (2.5 mg x 1)  (2.5 mg x 1)  (2.5 mg x 1)  (2.5 mg x 1)  (2.5 mg x 1)  (2.5 mg x 1)                         Description   1.5 tablet x 1 dose today 11/08/16     INR today: Therapeutic  PLAN Weekly dose was  unchanged  Patient Instructions  Patient educated about medication as defined in this encounter and verbalized understanding by repeating back instructions provided.   Patient advised to contact clinic or seek medical attention if signs/symptoms of bleeding or thromboembolism occur.  Patient verbalized understanding by repeating back information and was advised to contact me  if further medication-related questions arise. Patient was also provided an information handout.  Follow-up Return in about 1 week (around 12/08/2016).  [name]

## 2016-12-01 NOTE — Patient Instructions (Signed)
Patient educated about medication as defined in this encounter and verbalized understanding by repeating back instructions provided.

## 2016-12-04 LAB — POCT INR: INR: 3.2

## 2016-12-05 ENCOUNTER — Telehealth (INDEPENDENT_AMBULATORY_CARE_PROVIDER_SITE_OTHER): Payer: Medicare Other | Admitting: *Deleted

## 2016-12-05 DIAGNOSIS — Z86711 Personal history of pulmonary embolism: Secondary | ICD-10-CM | POA: Diagnosis not present

## 2016-12-05 DIAGNOSIS — Z7901 Long term (current) use of anticoagulants: Secondary | ICD-10-CM

## 2016-12-05 DIAGNOSIS — I82499 Acute embolism and thrombosis of other specified deep vein of unspecified lower extremity: Secondary | ICD-10-CM

## 2016-12-05 DIAGNOSIS — Z5181 Encounter for therapeutic drug level monitoring: Secondary | ICD-10-CM | POA: Diagnosis not present

## 2016-12-05 NOTE — Patient Instructions (Signed)
Patient educated about medication as defined in this encounter and verbalized understanding by repeating back instructions provided.

## 2016-12-05 NOTE — Progress Notes (Signed)
Anticoagulation Management Jacqueline Nguyen a 83 y.o.femalewho reports to the clinic for monitoring of warfarintreatment.   Indication: DVT and PEhistory Duration: indefinite Supervising physician: Gilles Chiquito  Anticoagulation Clinic Visit History: Patient does notreport signs/symptoms of bleeding or thromboembolism   Anticoagulation Episode Summary    Current INR goal:   2.0-3.0  TTR:   75.9 % (4.3 mo)  Next INR check:   12/11/2016  INR from last check:   3.2! (12/04/2016)  Weekly max warfarin dose:     Target end date:     INR check location:     Preferred lab:     Send INR reminders to:      Indications   DVT (deep venous thrombosis) (HCC) [I82.409] Anticoagulated on Coumadin [Z51.81 Z79.01] History of pulmonary embolism [Z86.711]       Comments:         Anticoagulation Care Providers    Provider Role Specialty Phone number   [name], MD Responsible Internal Medicine [phone]      Allergies  Allergen Reactions  . Ciprofloxacin   . Bactrim Rash  . Codeine Nausea Only   Medication Sig  acetic acid-hydrocortisone (VOSOL-HC) otic solution Place 3 drops into the right ear 3 (three) times daily. For seven days.  Ascorbic Acid (VITAMIN C PO) Take 1 capsule by mouth daily.  atenolol (TENORMIN) 25 MG tablet Take 1 tablet (25 mg total) by mouth 4 (four) times daily as needed. MAY TAKE 1 TABLET 4 TIMES DAILY AS NEEDED.  AZOPT 1 % ophthalmic suspension Place 1 drop into both eyes 2 (two) times daily.  budesonide-formoterol (SYMBICORT) 160-4.5 MCG/ACT inhaler Inhale 2 puffs into the lungs 2 (two) times daily.  Cholecalciferol (VITAMIN D PO) Take 1 capsule by mouth daily.  COMBIGAN 0.2-0.5 % ophthalmic solution Place 1 drop into both eyes 2 (two) times daily.  cyanocobalamin 1000 MCG tablet Take 1,000 mcg by mouth daily.  diltiazem (CARDIZEM CD) 120 MG 24 hr capsule TAKE ONE (1) CAPSULE BY MOUTH 2 TIMES DAILY <PLEASE MAKE APPOINTMENT FOR REFILLS>   DM-Doxylamine-Acetaminophen (NYQUIL COLD & FLU PO) Take 10 mLs by mouth daily as needed (mucus).  FLOVENT HFA 44 MCG/ACT inhaler INHALE 1 PUFF TWICE A DAY DAILY  furosemide (LASIX) 20 MG tablet Take 1 tablet (20 mg total) by mouth daily.  Homeopathic Products (ARNICARE ARNICA) CREA Apply 1 application topically daily.   HYDROcodone-acetaminophen (NORCO) 10-325 MG tablet Take 1 tablet by mouth every 6 (six) hours as needed for severe pain.  levalbuterol (XOPENEX) 0.63 MG/3ML nebulizer solution Take 1 ampule by nebulization every 8 (eight) hours as needed for wheezing.  magic mouthwash w/lidocaine SOLN Take 5 mLs by mouth 3 (three) times daily as needed for mouth pain.  meclizine (ANTIVERT) 25 MG tablet Take 1 tablet (25 mg total) by mouth 2 (two) times daily as needed for dizziness. TAKE ONE TABLET 2-3 TIMES A DAY AS NEEDED FOR VERTIGO  Multiple Vitamin (MULTIVITAMIN WITH MINERALS) TABS tablet Take 1 tablet by mouth daily.  NON FORMULARY Place 4 L into the nose at bedtime. 3 liter as needed during day depending on extertion   NON FORMULARY Apply 1 application topically daily as needed. [facility] Herbal Heal Ointment for damaged skin  NON FORMULARY Take 1-2 sprays by mouth daily as needed. Mouth Kote Dry Mouth Spray  Nystatin (NYAMYC) 100000 UNIT/GM POWD Apply topically.  OVER THE COUNTER MEDICATION "Calmer" sleep aid - Take 1 tablet by mouth daily as needed for sleep.  Respiratory Therapy  Supplies (FLUTTER) DEVI Blow through 4 times per set, three sets daily  warfarin (COUMADIN) 2.5 MG tablet Take 1 tablet (2.5 mg total) by mouth daily.   Past Medical History:  Diagnosis Date  . Arthritis   . Cataract   . Chronic respiratory failure ([place])   . Endometriosis   . HTN (hypertension)   . Hx of echocardiogram    Echo (8/15):  Mild LVH, EF 65%, no RWMA, Ao sclerosis, no AS, mod MAC, mild LAE, normal RVSF, PASP 40 mmHg  . Hx pulmonary embolism    on chronic coumadin  . Obesities,  morbid ([place])   . OSA (obstructive sleep apnea)   . Osteoporosis   . PAF (paroxysmal atrial fibrillation) ([place])    Event Monitor (8/15):  Frequent PACs, brief bursts of nonsustained ATach; no AFib  . Psoriasis   . Skin cancer   . Vertigo    Social History   Social History  . Marital status: Widowed    Spouse name: N/A  . Number of children: N/A  . Years of education: N/A   Social History Main Topics  . Smoking status: Former Smoker    Packs/day: 1.00    Years: 2.00    Types: Cigarettes    Quit date: 04/03/1981  . Smokeless tobacco: Never Used  . Alcohol use No  . Drug use: No  . Sexual activity: Not Currently   Other Topics Concern  . Not on file   Social History Narrative   She lives with her sister in [place] and requires assistance at home. Has an aid who visits and helps at home often. Son works as a Catering manager and also visits and helps when he can. Semi-independent in ADLs, requires walker around home, dependent in IADLs.   Family History  Problem Relation Age of Onset  . Heart attack Father   . Heart attack Brother   . Stroke Neg Hx    ASSESSMENT Recent Results: The most recent result is correlated with 17.5 mg per week: Lab Results  Component Value Date   INR 3.2 12/04/2016   INR 3.0 11/27/2016   INR 2.0 11/15/2016   Anticoagulation Dosing: INR as of 12/05/2016 and Previous Warfarin Dosing Information    INR Dt INR Goal [employer] Sun Mon Tue Wed Thu Fri Sat   12/04/2016 3.2 2.0-3.0 17.5 mg 2.5 mg 2.5 mg 2.5 mg 2.5 mg 2.5 mg 2.5 mg 2.5 mg    Previous description   1.5 tablet x 1 dose today 11/08/16   Anticoagulation Warfarin Dose Instructions as of 12/05/2016      Total Sun Mon Tue Wed Thu Fri Sat   New Dose 17.5 mg 2.5 mg 2.5 mg 2.5 mg 2.5 mg 2.5 mg 2.5 mg 2.5 mg     (2.5 mg x 1)  (2.5 mg x 1)  (2.5 mg x 1)  (2.5 mg x 1)  (2.5 mg x 1)  (2.5 mg x 1)  (2.5 mg x 1)                         Description   1/2 tablet x 1 dose 12/04/16     INR  today: Supratherapeutic  PLAN Take 1/2 tablet x 1 dose, then 1 tablet daily, re-check INR 1 week  Patient Instructions  Patient educated about medication as defined in this encounter and verbalized understanding by repeating back instructions provided.   Patient advised to contact clinic or seek  medical attention if signs/symptoms of bleeding or thromboembolism occur.  Patient verbalized understanding by repeating back information and was advised to contact me if further medication-related questions arise. Patient was also provided an information handout.  Follow-up Return in about 1 week (around 12/12/2016).  [name]

## 2016-12-05 NOTE — Telephone Encounter (Signed)
Call from [place] - pt's INR 3.2 drawn on 12/04/16.

## 2016-12-06 ENCOUNTER — Ambulatory Visit (INDEPENDENT_AMBULATORY_CARE_PROVIDER_SITE_OTHER): Payer: Medicare Other | Admitting: Internal Medicine

## 2016-12-06 ENCOUNTER — Encounter: Payer: Self-pay | Admitting: Internal Medicine

## 2016-12-06 VITALS — BP 161/73 | HR 76 | Temp 97.5°F | Wt 254.6 lb

## 2016-12-06 DIAGNOSIS — Z7901 Long term (current) use of anticoagulants: Secondary | ICD-10-CM

## 2016-12-06 DIAGNOSIS — R202 Paresthesia of skin: Secondary | ICD-10-CM

## 2016-12-06 DIAGNOSIS — R5383 Other fatigue: Secondary | ICD-10-CM | POA: Diagnosis not present

## 2016-12-06 DIAGNOSIS — Z5181 Encounter for therapeutic drug level monitoring: Secondary | ICD-10-CM

## 2016-12-06 DIAGNOSIS — I480 Paroxysmal atrial fibrillation: Secondary | ICD-10-CM

## 2016-12-06 DIAGNOSIS — G4733 Obstructive sleep apnea (adult) (pediatric): Secondary | ICD-10-CM | POA: Diagnosis not present

## 2016-12-06 DIAGNOSIS — R200 Anesthesia of skin: Secondary | ICD-10-CM | POA: Diagnosis not present

## 2016-12-06 DIAGNOSIS — I10 Essential (primary) hypertension: Secondary | ICD-10-CM

## 2016-12-06 DIAGNOSIS — I272 Pulmonary hypertension, unspecified: Secondary | ICD-10-CM | POA: Diagnosis not present

## 2016-12-06 DIAGNOSIS — G894 Chronic pain syndrome: Secondary | ICD-10-CM

## 2016-12-06 DIAGNOSIS — I129 Hypertensive chronic kidney disease with stage 1 through stage 4 chronic kidney disease, or unspecified chronic kidney disease: Secondary | ICD-10-CM | POA: Diagnosis not present

## 2016-12-06 MED ORDER — AMLODIPINE BESYLATE 5 MG PO TABS: 5.0000 mg | ORAL_TABLET | Freq: Every day | ORAL | 3 refills | Status: DC

## 2016-12-06 MED ORDER — HYDROCODONE-ACETAMINOPHEN 10-325 MG PO TABS: 1.0000 | ORAL_TABLET | Freq: Four times a day (QID) | ORAL | 0 refills | Status: DC | PRN

## 2016-12-06 NOTE — Assessment & Plan Note (Signed)
She is regular on exam today.  Her pulse is < 110.  She is taking coumadin, her last 2 INR have been a little high.  She is being monitored by Dr. Maudie Mercury here in our clinic and doing home INR's.  Given her fatigue and she was not completely able to tell me if her stool had changed (she did not think so), will check CBC today for anemia.   Plan Continue atenolol and diltiazem Continue coumadin Monitor for blood loss today.

## 2016-12-06 NOTE — Assessment & Plan Note (Signed)
She reports nightly compliance with her CPAP, however, given the way she is sleeping I think it might not be working as intended. She is very interested in getting a new more comfortable chair to sleep in .   Plan Continue CPAP.

## 2016-12-06 NOTE — Assessment & Plan Note (Signed)
She is on daily chronic O2.  She does not bring this to clinic, but is on it all the time at home.  Her o2 saturation in clinic was 94%.  She only occasionally takes her flovent.    Plan Continue oxygen and flovent Would have her go back to see pulmonology this year.

## 2016-12-06 NOTE — Assessment & Plan Note (Signed)
She has multiple reasons to have fatigue.  Poor sleep hygiene (in chair, leaned over, CPAP possibly not in correct position), chronic respiratory failure worsening, anxiety (untreated currently, but previously on BZDs), immobility.  Also with her acute symptoms, will check TSH and CBC given her INR has been elevated and she could be bleeding. Also, ESR may be helpful if she were to be developing GCA or PMR.    If all labs normal, will consider sleep hygiene as highest possibility.  Advised getting new chair and sleeping more reclined if she is able. Also, will refill her pain medication and possibly treat her nerve pain which may help her sleep.  May advise an aide such as melatonin to help with sleep.    Further plan pending work up.

## 2016-12-06 NOTE — Assessment & Plan Note (Signed)
Today BP is elevated.  Her home measurements have also been elevated in to the 761Y - 709K systolic.  She does have occasionally low diastolic pressures (today was in the normal range).  However, given her new fatigue and worsening blood pressures, I think adding another medication would be appropriate at this time.   Plan Add amlodipine 47m daily Continue diltiazem and atenolol Continue PRN lasix.  [place] so will [place] back [place] 6 weeks.  She is checking her BP daily and call them in to me.

## 2016-12-06 NOTE — Assessment & Plan Note (Signed)
INR has been trending up above 3 on last 2 checks.  Dr. Maudie Mercury has given advice over the phone to patient.

## 2016-12-06 NOTE — Assessment & Plan Note (Signed)
She now says that she is also having this in her feet.  She was supposed to get EMG's in the past but could not tolerate the length of the exam.  We discussed treating with a nerve pain medication such as gabapentin which she will think about.  Given the length of symptoms and her not being diagnosed as a diabetic, will do an initial work up.   Plan TSH, CBC with diff, BMET (last CMET was normal), ANA, ESR.  I am deferring SPEP UPEP at this time.  Last CMET was normal with normal Ca and normal protein gap.  Consider if all other blood work normal.

## 2016-12-06 NOTE — Progress Notes (Signed)
Subjective:    Patient ID: Jacqueline Nguyen, female    DOB: 03/12/1932, 83 y.o.   MRN: [id number]  CC: 3 month follow up for PAF, HTN  HPI   Jacqueline Nguyen is an 83yo woman with PMH of HTN, pHTN and chronic respiratory failure, PAF and recurrent VTE on coumadin, HLD, OSA who presents for routine follow up.   Today, Jacqueline Nguyen reports increased fatigue and sleeping more.  She has many reasons for increased fatigue including chronic respiratory failure, obesity, lack of mobility.  She notes that she has been listless, aching more, sleeping more but not having restorative sleep.  She has been breathing worse and getting breathless more easily.  She notes her chair in which she sleeps is uncomfortable and she is looking for a new one.  She has noted hair thinning and skin dryness.  She wears a sweater all the time despite the head.  She denies loss of blood or unilateral weakness.  She has been having increased frequency of urination and feels that she cannot completely empty her bladder.  No dysuria.  In addition, she feels achy in her chest and feels that she cannot completely expand her lungs.  She wears her oxygen most times at home, but does not bring it with her to MD appointments.  She wears her CPAP every night, but sleeps with her head completely flexed per her son.  She has no chest pain or palpitations.  She is taking her atenolol and diltiazem without issue.  Her pulse today is 76.  Finally, she tells me she has intermittent tingling in her feet which is painful.  She has been advised to get EMG in the past, but cannot tolerate the test due to needing to lie flat for 2 hours. Her O2 saturation off of oxygen is 94%.  She has a diagnosis of COPD, but has never smoked.  She believes her lung disease is related to chronic PE and pulmonary HTN.  She only occasionally uses her flovent regularly, but has been using it BID recently.    She has a very small basal cell carcinoma on the left ala of the nose.  Both  Dermatology and XRT have declined to treat and would like to monitor for changes, which is frustrating to her.    She follows with Dr. Prudencio Burly for glaucoma.  Recently her pressures have worsened and that group will be seeing her more regularly.     Review of Systems As per HPI, otherwise a 10 point ROS was negative.      Objective:   Physical Exam  Constitutional: She is oriented to person, place, and time. She appears well-developed and well-nourished.  Elderly, did become tearful during exam  HENT:  Head: Normocephalic and atraumatic.  Mouth/Throat: No oropharyngeal exudate.  Eyes: Conjunctivae are normal. No scleral icterus.  Neck: Neck supple. No thyromegaly present.  Cardiovascular: Normal rate, regular rhythm and normal heart sounds.   No murmur heard. Pulmonary/Chest: Effort normal and breath sounds normal. No respiratory distress. She has no wheezes.  Abdominal: Soft. Bowel sounds are normal.  Musculoskeletal: She exhibits edema (mild at ankles) and tenderness (at ankles bilaterally).  Lymphadenopathy:    She has no cervical adenopathy.  Neurological: She is alert and oriented to person, place, and time. No cranial nerve deficit. She exhibits normal muscle tone.  Skin: Skin is warm and dry.  She has very thin skin and some bruising.  She has a small BCC on the left nose  Psychiatric:  Tearful when speaking about her fatigue.  Otherwise mood was normal.     TSH, CBC with differential, BMET, ANA, ESR.      Assessment & Plan:  RTC in 6 weeks.

## 2016-12-06 NOTE — Assessment & Plan Note (Signed)
She has chronic arthritis pain which is a little more severe.  She uses hydrocodone appropriately and has no constipation.  She is more fatigued lately, however, her dose is unchanged.    I reviewed the [place] narcotic database which was appropriate.   Consider UDS at next visit.

## 2016-12-06 NOTE — Progress Notes (Signed)
Reviewed Dr. Julianne Rice note and agree with plan.

## 2016-12-06 NOTE — Patient Instructions (Addendum)
[name] - -   For your fatigue and numbness/tingling, I am going to do some blood work today to see if we can find an organic reason for them.  You may want to try gabapentin in the future for nerve pain, more information is below.   For your blood pressure, I would recommend that you start taking Amlodipine 102m daily and continue to check your blood pressure daily.   Come back to see me in 6 weeks for blood pressure.    Thank you!  Amlodipine tablets What is this medicine? AMLODIPINE (am LOE di peen) is a calcium-channel blocker. It affects the amount of calcium found in your heart and muscle cells. This relaxes your blood vessels, which can reduce the amount of work the heart has to do. This medicine is used to lower high blood pressure. It is also used to prevent chest pain. This medicine may be used for other purposes; ask your health care provider or pharmacist if you have questions. COMMON BRAND NAME(S): Norvasc What should I tell my health care provider before I take this medicine? They need to know if you have any of these conditions: -heart problems like heart failure or aortic stenosis -liver disease -an unusual or allergic reaction to amlodipine, other medicines, foods, dyes, or preservatives -pregnant or trying to get pregnant -breast-feeding How should I use this medicine? Take this medicine by mouth with a glass of water. Follow the directions on the prescription label. Take your medicine at regular intervals. Do not take more medicine than directed. Talk to your pediatrician regarding the use of this medicine in children. Special care may be needed. This medicine has been used in children as young as 6. Persons over 642years old may have a stronger reaction to this medicine and need smaller doses. Overdosage: If you think you have taken too much of this medicine contact a poison control center or emergency room at once. NOTE: This medicine is only for you. Do not share this  medicine with others. What if I miss a dose? If you miss a dose, take it as soon as you can. If it is almost time for your next dose, take only that dose. Do not take double or extra doses. What may interact with this medicine? -herbal or dietary supplements -local or general anesthetics -medicines for high blood pressure -medicines for prostate problems -rifampin This list may not describe all possible interactions. Give your health care provider a list of all the medicines, herbs, non-prescription drugs, or dietary supplements you use. Also tell them if you smoke, drink alcohol, or use illegal drugs. Some items may interact with your medicine. What should I watch for while using this medicine? Visit your doctor or health care professional for regular check ups. Check your blood pressure and pulse rate regularly. Ask your health care professional what your blood pressure and pulse rate should be, and when you should contact him or her. This medicine may make you feel confused, dizzy or lightheaded. Do not drive, use machinery, or do anything that needs mental alertness until you know how this medicine affects you. To reduce the risk of dizzy or fainting spells, do not sit or stand up quickly, especially if you are an older patient. Avoid alcoholic drinks; they can make you more dizzy. Do not suddenly stop taking amlodipine. Ask your doctor or health care professional how you can gradually reduce the dose. What side effects may I notice from receiving this medicine? Side effects  that you should report to your doctor or health care professional as soon as possible: -allergic reactions like skin rash, itching or hives, swelling of the face, lips, or tongue -breathing problems -changes in vision or hearing -chest pain -fast, irregular heartbeat -swelling of legs or ankles Side effects that usually do not require medical attention (report to your doctor or health care professional if they continue  or are bothersome): -dry mouth -facial flushing -nausea, vomiting -stomach gas, pain -tired, weak -trouble sleeping This list may not describe all possible side effects. Call your doctor for medical advice about side effects. You may report side effects to FDA at 1-800-FDA-1088. Where should I keep my medicine? Keep out of the reach of children. Store at room temperature between 59 and 86 degrees F (15 and 30 degrees C). Protect from light. Keep container tightly closed. Throw away any unused medicine after the expiration date. NOTE: This sheet is a summary. It may not cover all possible information. If you have questions about this medicine, talk to your doctor, pharmacist, or health care provider.  2018 Elsevier/Gold Standard (2012-02-16 11:40:58)  Gabapentin capsules or tablets What is this medicine? GABAPENTIN (GA ba pen tin) is used to control partial seizures in adults with epilepsy. It is also used to treat certain types of nerve pain. This medicine may be used for other purposes; ask your health care provider or pharmacist if you have questions. COMMON BRAND NAME(S): Active-PAC with Gabapentin, Gabarone, Neurontin What should I tell my health care provider before I take this medicine? They need to know if you have any of these conditions: -kidney disease -suicidal thoughts, plans, or attempt; a previous suicide attempt by you or a family member -an unusual or allergic reaction to gabapentin, other medicines, foods, dyes, or preservatives -pregnant or trying to get pregnant -breast-feeding How should I use this medicine? Take this medicine by mouth with a glass of water. Follow the directions on the prescription label. You can take it with or without food. If it upsets your stomach, take it with food.Take your medicine at regular intervals. Do not take it more often than directed. Do not stop taking except on your doctor's advice. If you are directed to break the 600 or 800 mg  tablets in half as part of your dose, the extra half tablet should be used for the next dose. If you have not used the extra half tablet within 28 days, it should be thrown away. A special MedGuide will be given to you by the pharmacist with each prescription and refill. Be sure to read this information carefully each time. Talk to your pediatrician regarding the use of this medicine in children. Special care may be needed. Overdosage: If you think you have taken too much of this medicine contact a poison control center or emergency room at once. NOTE: This medicine is only for you. Do not share this medicine with others. What if I miss a dose? If you miss a dose, take it as soon as you can. If it is almost time for your next dose, take only that dose. Do not take double or extra doses. What may interact with this medicine? Do not take this medicine with any of the following medications: -other gabapentin products This medicine may also interact with the following medications: -alcohol -antacids -antihistamines for allergy, cough and cold -certain medicines for anxiety or sleep -certain medicines for depression or psychotic disturbances -homatropine; hydrocodone -naproxen -narcotic medicines (opiates) for pain -phenothiazines like chlorpromazine,  mesoridazine, prochlorperazine, thioridazine This list may not describe all possible interactions. Give your health care provider a list of all the medicines, herbs, non-prescription drugs, or dietary supplements you use. Also tell them if you smoke, drink alcohol, or use illegal drugs. Some items may interact with your medicine. What should I watch for while using this medicine? Visit your doctor or health care professional for regular checks on your progress. You may want to keep a record at home of how you feel your condition is responding to treatment. You may want to share this information with your doctor or health care professional at each  visit. You should contact your doctor or health care professional if your seizures get worse or if you have any new types of seizures. Do not stop taking this medicine or any of your seizure medicines unless instructed by your doctor or health care professional. Stopping your medicine suddenly can increase your seizures or their severity. Wear a medical identification bracelet or chain if you are taking this medicine for seizures, and carry a card that lists all your medications. You may get drowsy, dizzy, or have blurred vision. Do not drive, use machinery, or do anything that needs mental alertness until you know how this medicine affects you. To reduce dizzy or fainting spells, do not sit or stand up quickly, especially if you are an older patient. Alcohol can increase drowsiness and dizziness. Avoid alcoholic drinks. Your mouth may get dry. Chewing sugarless gum or sucking hard candy, and drinking plenty of water will help. The use of this medicine may increase the chance of suicidal thoughts or actions. Pay special attention to how you are responding while on this medicine. Any worsening of mood, or thoughts of suicide or dying should be reported to your health care professional right away. Women who become pregnant while using this medicine may enroll in the [place] Pregnancy Registry by calling [phone]. This registry collects information about the safety of antiepileptic drug use during pregnancy. What side effects may I notice from receiving this medicine? Side effects that you should report to your doctor or health care professional as soon as possible: -allergic reactions like skin rash, itching or hives, swelling of the face, lips, or tongue -worsening of mood, thoughts or actions of suicide or dying Side effects that usually do not require medical attention (report to your doctor or health care professional if they continue or are  bothersome): -constipation -difficulty walking or controlling muscle movements -dizziness -nausea -slurred speech -tiredness -tremors -weight gain This list may not describe all possible side effects. Call your doctor for medical advice about side effects. You may report side effects to FDA at 1-800-FDA-1088. Where should I keep my medicine? Keep out of reach of children. This medicine may cause accidental overdose and death if it taken by other adults, children, or pets. Mix any unused medicine with a substance like cat litter or coffee grounds. Then throw the medicine away in a sealed container like a sealed bag or a coffee can with a lid. Do not use the medicine after the expiration date. Store at room temperature between 15 and 30 degrees C (59 and 86 degrees F). NOTE: This sheet is a summary. It may not cover all possible information. If you have questions about this medicine, talk to your doctor, pharmacist, or health care provider.  2018 Elsevier/Gold Standard (2013-05-16 15:26:50)

## 2016-12-07 LAB — CBC WITH DIFFERENTIAL/PLATELET
BASOS: 0 %
Basophils Absolute: 0 10*3/uL (ref 0.0–0.2)
EOS (ABSOLUTE): 0.2 10*3/uL (ref 0.0–0.4)
EOS: 2 %
HEMATOCRIT: 40 % (ref 34.0–46.6)
Hemoglobin: 12.8 g/dL (ref 11.1–15.9)
IMMATURE GRANS (ABS): 0 10*3/uL (ref 0.0–0.1)
IMMATURE GRANULOCYTES: 0 %
Lymphocytes Absolute: 2 10*3/uL (ref 0.7–3.1)
Lymphs: 22 %
MCH: 29.8 pg (ref 26.6–33.0)
MCHC: 32 g/dL (ref 31.5–35.7)
MCV: 93 fL (ref 79–97)
MONOS ABS: 0.8 10*3/uL (ref 0.1–0.9)
Monocytes: 9 %
NEUTROS PCT: 67 %
Neutrophils Absolute: 5.9 10*3/uL (ref 1.4–7.0)
Platelets: 365 10*3/uL (ref 150–379)
RBC: 4.3 x10E6/uL (ref 3.77–5.28)
RDW: 13.8 % (ref 12.3–15.4)
WBC: 8.9 10*3/uL (ref 3.4–10.8)

## 2016-12-07 LAB — ANTINUCLEAR ANTIBODIES, IFA: ANA TITER 1: NEGATIVE

## 2016-12-07 LAB — BMP8+ANION GAP
ANION GAP: 17 mmol/L (ref 10.0–18.0)
BUN/Creatinine Ratio: 20 (ref 12–28)
BUN: 13 mg/dL (ref 8–27)
CALCIUM: 9.1 mg/dL (ref 8.7–10.3)
CO2: 27 mmol/L (ref 20–29)
CREATININE: 0.64 mg/dL (ref 0.57–1.00)
Chloride: 102 mmol/L (ref 96–106)
GFR calc Af Amer: 95 mL/min/{1.73_m2} (ref >59)
GFR, EST NON AFRICAN AMERICAN: 83 mL/min/{1.73_m2} (ref >59)
Glucose: 124 mg/dL — ABNORMAL HIGH (ref 65–99)
Potassium: 4.2 mmol/L (ref 3.5–5.2)
Sodium: 146 mmol/L — ABNORMAL HIGH (ref 134–144)

## 2016-12-07 LAB — SEDIMENTATION RATE: SED RATE: 17 mm/h (ref 0–40)

## 2016-12-07 LAB — TSH: TSH: 1.3 u[IU]/mL (ref 0.450–4.500)

## 2016-12-08 ENCOUNTER — Other Ambulatory Visit: Payer: Self-pay | Admitting: Internal Medicine

## 2016-12-11 ENCOUNTER — Telehealth: Payer: Self-pay

## 2016-12-11 LAB — POCT INR: INR: 2.4

## 2016-12-11 NOTE — Telephone Encounter (Signed)
Requesting to speak with Bonnita Nasuti.

## 2016-12-11 NOTE — Telephone Encounter (Signed)
Rtc, scheduled f/u appt

## 2016-12-12 ENCOUNTER — Encounter: Payer: Self-pay | Admitting: Pharmacist

## 2016-12-12 DIAGNOSIS — Z86711 Personal history of pulmonary embolism: Secondary | ICD-10-CM

## 2016-12-12 DIAGNOSIS — Z7901 Long term (current) use of anticoagulants: Secondary | ICD-10-CM

## 2016-12-12 DIAGNOSIS — Z86718 Personal history of other venous thrombosis and embolism: Secondary | ICD-10-CM

## 2016-12-12 NOTE — Patient Instructions (Signed)
Patient educated about medication as defined in this encounter and verbalized understanding by repeating back instructions provided.

## 2016-12-12 NOTE — Progress Notes (Signed)
Anticoagulation Management Jacqueline Nguyen a 83 y.o.femalewho was contacted for monitoring of warfarintreatment.Patient has home INR meter.  Indication: DVT and PEhistory Duration: indefinite Supervising physician: Gilles Chiquito  Patient reports no signs or symptoms of bleeding or thromboembolism.  Anticoagulation Episode Summary    Current INR goal:   2.0-3.0  TTR:   75.8 % (4.5 mo)  Next INR check:   12/18/2016  INR from last check:   2.4 (12/11/2016)  Weekly max warfarin dose:     Target end date:     INR check location:     Preferred lab:     Send INR reminders to:      Indications   History of DVT (deep vein thrombosis) [Z86.718] Anticoagulated on Coumadin [Z51.81 Z79.01] History of pulmonary embolism [Z86.711]       Comments:         Anticoagulation Care Providers    Provider Role Specialty Phone number   [name], MD Responsible Internal Medicine [phone]     Allergies  Allergen Reactions  . Ciprofloxacin   . Bactrim Rash  . Codeine Nausea Only   Medication Sig  acetic acid-hydrocortisone (VOSOL-HC) otic solution Place 3 drops into the right ear 3 (three) times daily. For seven days.  amLODipine (NORVASC) 5 MG tablet Take 1 tablet (5 mg total) by mouth daily.  Ascorbic Acid (VITAMIN C PO) Take 1 capsule by mouth daily.  atenolol (TENORMIN) 25 MG tablet Take 1 tablet (25 mg total) by mouth 4 (four) times daily as needed. MAY TAKE 1 TABLET 4 TIMES DAILY AS NEEDED.  AZOPT 1 % ophthalmic suspension Place 1 drop into both eyes 2 (two) times daily.  budesonide-formoterol (SYMBICORT) 160-4.5 MCG/ACT inhaler Inhale 2 puffs into the lungs 2 (two) times daily.  Cholecalciferol (VITAMIN D PO) Take 1 capsule by mouth daily.  COMBIGAN 0.2-0.5 % ophthalmic solution Place 1 drop into both eyes 2 (two) times daily.  cyanocobalamin 1000 MCG tablet Take 1,000 mcg by mouth daily.  diltiazem (CARDIZEM CD) 120 MG 24 hr capsule TAKE ONE (1) CAPSULE BY MOUTH 2 TIMES  DAILY <PLEASE MAKE APPOINTMENT FOR REFILLS>  DM-Doxylamine-Acetaminophen (NYQUIL COLD & FLU PO) Take 10 mLs by mouth daily as needed (mucus).  FLOVENT HFA 44 MCG/ACT inhaler INHALE 1 PUFF TWICE A DAY DAILY  furosemide (LASIX) 20 MG tablet Take 1 tablet (20 mg total) by mouth daily.  Homeopathic Products (ARNICARE ARNICA) CREA Apply 1 application topically daily.   HYDROcodone-acetaminophen (NORCO) 10-325 MG tablet Take 1 tablet by mouth every 6 (six) hours as needed for severe pain.  levalbuterol (XOPENEX) 0.63 MG/3ML nebulizer solution Take 1 ampule by nebulization every 8 (eight) hours as needed for wheezing.  magic mouthwash w/lidocaine SOLN Take 5 mLs by mouth 3 (three) times daily as needed for mouth pain.  meclizine (ANTIVERT) 25 MG tablet Take 1 tablet (25 mg total) by mouth 2 (two) times daily as needed for dizziness. TAKE ONE TABLET 2-3 TIMES A DAY AS NEEDED FOR VERTIGO  Multiple Vitamin (MULTIVITAMIN WITH MINERALS) TABS tablet Take 1 tablet by mouth daily.  NON FORMULARY Place 4 L into the nose at bedtime. 3 liter as needed during day depending on extertion   NON FORMULARY Apply 1 application topically daily as needed. [facility] Herbal Heal Ointment for damaged skin  NON FORMULARY Take 1-2 sprays by mouth daily as needed. Mouth Kote Dry Mouth Spray  Nystatin (NYAMYC) 100000 UNIT/GM POWD Apply topically.  OVER THE COUNTER MEDICATION "Calmer" sleep aid -  Take 1 tablet by mouth daily as needed for sleep.  Respiratory Therapy Supplies (FLUTTER) DEVI Blow through 4 times per set, three sets daily  warfarin (COUMADIN) 2.5 MG tablet Take 1 tablet (2.5 mg total) by mouth daily.   Past Medical History:  Diagnosis Date  . Arthritis   . Cataract   . Chronic respiratory failure ([place])   . Endometriosis   . HTN (hypertension)   . Hx of echocardiogram    Echo (8/15):  Mild LVH, EF 65%, no RWMA, Ao sclerosis, no AS, mod MAC, mild LAE, normal RVSF, PASP 40 mmHg  . Hx pulmonary embolism     on chronic coumadin  . Obesities, morbid ([place])   . OSA (obstructive sleep apnea)   . Osteoporosis   . PAF (paroxysmal atrial fibrillation) ([place])    Event Monitor (8/15):  Frequent PACs, brief bursts of nonsustained ATach; no AFib  . Psoriasis   . Skin cancer   . Vertigo    Social History   Social History  . Marital status: Widowed    Spouse name: N/A  . Number of children: N/A  . Years of education: N/A   Social History Main Topics  . Smoking status: Former Smoker    Packs/day: 1.00    Years: 2.00    Types: Cigarettes    Quit date: 04/03/1981  . Smokeless tobacco: Never Used  . Alcohol use No  . Drug use: No  . Sexual activity: Not Currently   Other Topics Concern  . Not on file   Social History Narrative   She lives with her sister in [place] and requires assistance at home. Has an aid who visits and helps at home often. Son works as a Catering manager and also visits and helps when he can. Semi-independent in ADLs, requires walker around home, dependent in IADLs.   Family History  Problem Relation Age of Onset  . Heart attack Father   . Heart attack Brother   . Stroke Neg Hx    ASSESSMENT Recent Results Lab Results  Component Value Date   INR 2.4 12/11/2016   INR 3.2 12/04/2016   INR 3.0 11/27/2016   Anticoagulation Dosing: INR as of 12/12/2016 and Previous Warfarin Dosing Information    INR Dt INR Goal [employer] Sun Mon Tue Wed Thu Fri Sat   12/11/2016 2.4 2.0-3.0 17.5 mg 2.5 mg 2.5 mg 2.5 mg 2.5 mg 2.5 mg 2.5 mg 2.5 mg    Previous description   1/2 tablet x 1 dose 12/04/16   Anticoagulation Warfarin Dose Instructions as of 12/12/2016      Total Sun Mon Tue Wed Thu Fri Sat   New Dose 17.5 mg 2.5 mg 2.5 mg 2.5 mg 2.5 mg 2.5 mg 2.5 mg 2.5 mg     (2.5 mg x 1)  (2.5 mg x 1)  (2.5 mg x 1)  (2.5 mg x 1)  (2.5 mg x 1)  (2.5 mg x 1)  (2.5 mg x 1)                           INR today: Therapeutic  PLAN Weekly dose was unchanged  There are no  Patient Instructions on file for this visit. Patient advised to contact clinic or seek medical attention if signs/symptoms of bleeding or thromboembolism occur.  Patient verbalized understanding by repeating back information and was advised to contact me if further medication-related questions arise. Patient was also provided  an information handout.  Follow-up Return in about 2 weeks (around 12/26/2016).  [name]

## 2016-12-13 LAB — PROTIME-INR: INR: 1.8

## 2016-12-14 NOTE — Progress Notes (Signed)
I reviewed Dr. Julianne Rice note and agree with plan.

## 2016-12-18 LAB — POCT INR: INR: 2

## 2016-12-20 ENCOUNTER — Encounter: Payer: Self-pay | Admitting: Pharmacist

## 2016-12-20 DIAGNOSIS — Z86711 Personal history of pulmonary embolism: Secondary | ICD-10-CM

## 2016-12-20 DIAGNOSIS — Z86718 Personal history of other venous thrombosis and embolism: Secondary | ICD-10-CM

## 2016-12-20 DIAGNOSIS — Z7901 Long term (current) use of anticoagulants: Secondary | ICD-10-CM

## 2016-12-20 NOTE — Progress Notes (Signed)
Anticoagulation Management Jacqueline Nguyen a 84 y.o.femalewho was contacted for monitoring of warfarintreatment.Patient has home INR meter.  Indication: DVT and PEhistory Duration: indefinite Supervising physician: Gilles Chiquito  Patient reports no signs or symptoms of bleeding or thromboembolism.  Anticoagulation Episode Summary    Current INR goal:   2.0-3.0  TTR:   73.1 % (4.7 mo)  Next INR check:   12/25/2016  INR from last check:   2.0 (12/18/2016)  Weekly max warfarin dose:     Target end date:     INR check location:     Preferred lab:     Send INR reminders to:      Indications   History of DVT (deep vein thrombosis) [Z86.718] Anticoagulated on Coumadin [Z51.81 Z79.01] History of pulmonary embolism [Z86.711]       Comments:         Anticoagulation Care Providers    Provider Role Specialty Phone number   [name], MD Responsible Internal Medicine [phone]     Allergies  Allergen Reactions  . Ciprofloxacin   . Bactrim Rash  . Codeine Nausea Only   Medication Sig  acetic acid-hydrocortisone (VOSOL-HC) otic solution Place 3 drops into the right ear 3 (three) times daily. For seven days.  amLODipine (NORVASC) 5 MG tablet Take 1 tablet (5 mg total) by mouth daily.  Ascorbic Acid (VITAMIN C PO) Take 1 capsule by mouth daily.  atenolol (TENORMIN) 25 MG tablet Take 1 tablet (25 mg total) by mouth 4 (four) times daily as needed. MAY TAKE 1 TABLET 4 TIMES DAILY AS NEEDED.  AZOPT 1 % ophthalmic suspension Place 1 drop into both eyes 2 (two) times daily.  budesonide-formoterol (SYMBICORT) 160-4.5 MCG/ACT inhaler Inhale 2 puffs into the lungs 2 (two) times daily.  Cholecalciferol (VITAMIN D PO) Take 1 capsule by mouth daily.  COMBIGAN 0.2-0.5 % ophthalmic solution Place 1 drop into both eyes 2 (two) times daily.  cyanocobalamin 1000 MCG tablet Take 1,000 mcg by mouth daily.  diltiazem (CARDIZEM CD) 120 MG 24 hr capsule TAKE ONE (1) CAPSULE BY MOUTH 2 TIMES  DAILY <PLEASE MAKE APPOINTMENT FOR REFILLS>  DM-Doxylamine-Acetaminophen (NYQUIL COLD & FLU PO) Take 10 mLs by mouth daily as needed (mucus).  FLOVENT HFA 44 MCG/ACT inhaler INHALE 1 PUFF TWICE A DAY DAILY  furosemide (LASIX) 20 MG tablet Take 1 tablet (20 mg total) by mouth daily.  Homeopathic Products (ARNICARE ARNICA) CREA Apply 1 application topically daily.   HYDROcodone-acetaminophen (NORCO) 10-325 MG tablet Take 1 tablet by mouth every 6 (six) hours as needed for severe pain.  levalbuterol (XOPENEX) 0.63 MG/3ML nebulizer solution Take 1 ampule by nebulization every 8 (eight) hours as needed for wheezing.  magic mouthwash w/lidocaine SOLN Take 5 mLs by mouth 3 (three) times daily as needed for mouth pain.  meclizine (ANTIVERT) 25 MG tablet Take 1 tablet (25 mg total) by mouth 2 (two) times daily as needed for dizziness. TAKE ONE TABLET 2-3 TIMES A DAY AS NEEDED FOR VERTIGO  Multiple Vitamin (MULTIVITAMIN WITH MINERALS) TABS tablet Take 1 tablet by mouth daily.  NON FORMULARY Place 4 L into the nose at bedtime. 3 liter as needed during day depending on extertion   NON FORMULARY Apply 1 application topically daily as needed. [facility] Herbal Heal Ointment for damaged skin  NON FORMULARY Take 1-2 sprays by mouth daily as needed. Mouth Kote Dry Mouth Spray  Nystatin (NYAMYC) 100000 UNIT/GM POWD Apply topically.  OVER THE COUNTER MEDICATION "Calmer" sleep aid -  Take 1 tablet by mouth daily as needed for sleep.  Respiratory Therapy Supplies (FLUTTER) DEVI Blow through 4 times per set, three sets daily  warfarin (COUMADIN) 2.5 MG tablet Take 1 tablet (2.5 mg total) by mouth daily.   Past Medical History:  Diagnosis Date  . Arthritis   . Cataract   . Chronic respiratory failure ([place])   . Endometriosis   . HTN (hypertension)   . Hx of echocardiogram    Echo (8/15):  Mild LVH, EF 65%, no RWMA, Ao sclerosis, no AS, mod MAC, mild LAE, normal RVSF, PASP 40 mmHg  . Hx pulmonary embolism     on chronic coumadin  . Obesities, morbid ([place])   . OSA (obstructive sleep apnea)   . Osteoporosis   . PAF (paroxysmal atrial fibrillation) ([place])    Event Monitor (8/15):  Frequent PACs, brief bursts of nonsustained ATach; no AFib  . Psoriasis   . Skin cancer   . Vertigo    Social History   Social History  . Marital status: Widowed    Spouse name: N/A  . Number of children: N/A  . Years of education: N/A   Social History Main Topics  . Smoking status: Former Smoker    Packs/day: 1.00    Years: 2.00    Types: Cigarettes    Quit date: 04/03/1981  . Smokeless tobacco: Never Used  . Alcohol use No  . Drug use: No  . Sexual activity: Not Currently   Other Topics Concern  . Not on file   Social History Narrative   She lives with her sister in [place] and requires assistance at home. Has an aid who visits and helps at home often. Son works as a Catering manager and also visits and helps when he can. Semi-independent in ADLs, requires walker around home, dependent in IADLs.   Family History  Problem Relation Age of Onset  . Heart attack Father   . Heart attack Brother   . Stroke Neg Hx    ASSESSMENT Recent Results: Lab Results  Component Value Date   INR 2.0 12/18/2016   INR 2.4 12/11/2016   INR 3.2 12/04/2016   Anticoagulation Dosing: INR as of 12/20/2016 and Previous Warfarin Dosing Information    INR Dt INR Goal [employer] Sun Mon Tue Wed Thu Fri Sat   12/18/2016 2.0 2.0-3.0 17.5 mg 2.5 mg 2.5 mg 2.5 mg 2.5 mg 2.5 mg 2.5 mg 2.5 mg    Anticoagulation Warfarin Dose Instructions as of 12/20/2016      Total Sun Mon Tue Wed Thu Fri Sat   New Dose 17.5 mg 2.5 mg 2.5 mg 2.5 mg 2.5 mg 2.5 mg 2.5 mg 2.5 mg     (2.5 mg x 1)  (2.5 mg x 1)  (2.5 mg x 1)  (2.5 mg x 1)  (2.5 mg x 1)  (2.5 mg x 1)  (2.5 mg x 1)                           INR today: Therapeutic  PLAN Weekly dose was unchanged   Patient Instructions  Patient educated about medication as defined in  this encounter and verbalized understanding by repeating back instructions provided.   Patient advised to contact clinic or seek medical attention if signs/symptoms of bleeding or thromboembolism occur.  Patient verbalized understanding by repeating back information and was advised to contact me if further medication-related questions arise. Patient was also  provided an information handout.  Follow-up Return in about 1 week (around 12/27/2016).  [name]

## 2016-12-20 NOTE — Patient Instructions (Signed)
Patient educated about medication as defined in this encounter and verbalized understanding by repeating back instructions provided.

## 2016-12-21 ENCOUNTER — Other Ambulatory Visit: Payer: Self-pay | Admitting: *Deleted

## 2016-12-21 LAB — PROTIME-INR

## 2016-12-21 MED ORDER — DILTIAZEM HCL ER COATED BEADS 120 MG PO CP24: ORAL_CAPSULE | ORAL | 3 refills | Status: AC

## 2016-12-26 ENCOUNTER — Ambulatory Visit (INDEPENDENT_AMBULATORY_CARE_PROVIDER_SITE_OTHER): Payer: Medicare Other | Admitting: Pharmacist

## 2016-12-26 ENCOUNTER — Encounter: Payer: Self-pay | Admitting: Internal Medicine

## 2016-12-26 ENCOUNTER — Ambulatory Visit (INDEPENDENT_AMBULATORY_CARE_PROVIDER_SITE_OTHER): Payer: Medicare Other | Admitting: Cardiovascular Disease

## 2016-12-26 ENCOUNTER — Ambulatory Visit (INDEPENDENT_AMBULATORY_CARE_PROVIDER_SITE_OTHER): Payer: Medicare Other | Admitting: Internal Medicine

## 2016-12-26 VITALS — BP 140/51 | HR 69 | Temp 98.1°F

## 2016-12-26 VITALS — BP 157/64 | HR 62 | Ht 65.0 in | Wt 253.0 lb

## 2016-12-26 DIAGNOSIS — G4733 Obstructive sleep apnea (adult) (pediatric): Secondary | ICD-10-CM | POA: Diagnosis not present

## 2016-12-26 DIAGNOSIS — Z87891 Personal history of nicotine dependence: Secondary | ICD-10-CM | POA: Diagnosis not present

## 2016-12-26 DIAGNOSIS — Z7901 Long term (current) use of anticoagulants: Secondary | ICD-10-CM

## 2016-12-26 DIAGNOSIS — I10 Essential (primary) hypertension: Secondary | ICD-10-CM

## 2016-12-26 DIAGNOSIS — Z23 Encounter for immunization: Secondary | ICD-10-CM

## 2016-12-26 DIAGNOSIS — I50812 Chronic right heart failure: Secondary | ICD-10-CM | POA: Diagnosis not present

## 2016-12-26 DIAGNOSIS — I480 Paroxysmal atrial fibrillation: Secondary | ICD-10-CM

## 2016-12-26 DIAGNOSIS — Z5181 Encounter for therapeutic drug level monitoring: Secondary | ICD-10-CM | POA: Diagnosis not present

## 2016-12-26 DIAGNOSIS — Z86711 Personal history of pulmonary embolism: Secondary | ICD-10-CM

## 2016-12-26 DIAGNOSIS — R29898 Other symptoms and signs involving the musculoskeletal system: Secondary | ICD-10-CM | POA: Diagnosis not present

## 2016-12-26 DIAGNOSIS — Z86718 Personal history of other venous thrombosis and embolism: Secondary | ICD-10-CM

## 2016-12-26 DIAGNOSIS — Z79899 Other long term (current) drug therapy: Secondary | ICD-10-CM

## 2016-12-26 DIAGNOSIS — I82402 Acute embolism and thrombosis of unspecified deep veins of left lower extremity: Secondary | ICD-10-CM

## 2016-12-26 DIAGNOSIS — I272 Pulmonary hypertension, unspecified: Secondary | ICD-10-CM | POA: Diagnosis not present

## 2016-12-26 DIAGNOSIS — J9611 Chronic respiratory failure with hypoxia: Secondary | ICD-10-CM | POA: Diagnosis not present

## 2016-12-26 LAB — POCT INR: INR: 2.1

## 2016-12-26 MED ORDER — WARFARIN SODIUM 2.5 MG PO TABS: 2.5000 mg | ORAL_TABLET | Freq: Every day | ORAL | 3 refills | Status: DC

## 2016-12-26 NOTE — Progress Notes (Signed)
INTERNAL MEDICINE TEACHING ATTENDING ADDENDUM  I agree with pharmacy recommendations as outlined in their note.   Oda Kilts, MD

## 2016-12-26 NOTE — Progress Notes (Signed)
   CC: hypertension  HPI:  [name] is a 83 y.o. female with past medical history as documented below presenting for hypertension follow up. She was last seen in clinic on 12/07/2016 by Dr. Daryll Drown. At that time, Amlodipine 5 mg was added to her blood pressure medication regimen. She has not been consistently taking the Amlodipine. She states that she took once or twice and felt that it caused her BP to drop to low, which caused her to feel dizzy and lightheaded so she stopped taking it. She does check her BP at home and states that her systolic pressures are typically in 160-170, but sometimes her diastolic pressures get low into the 50-60s, which concerns her. She states that her blood pressures are "all over the place." She states she takes diltiazem daily and atenolol on an as needed basis.   Past Medical History:  Diagnosis Date  . Arthritis   . Cataract   . Chronic respiratory failure ([place])   . Endometriosis   . HTN (hypertension)   . Hx of echocardiogram    Echo (8/15):  Mild LVH, EF 65%, no RWMA, Ao sclerosis, no AS, mod MAC, mild LAE, normal RVSF, PASP 40 mmHg  . Hx pulmonary embolism    on chronic coumadin  . Obesities, morbid ([place])   . OSA (obstructive sleep apnea)   . Osteoporosis   . PAF (paroxysmal atrial fibrillation) ([place])    Event Monitor (8/15):  Frequent PACs, brief bursts of nonsustained ATach; no AFib  . Psoriasis   . Skin cancer   . Vertigo    Review of Systems:  Review of Systems  Constitutional: Positive for malaise/fatigue. Negative for chills and fever.  Respiratory: Negative for shortness of breath.   Cardiovascular: Positive for palpitations. Negative for chest pain.  Neurological: Positive for dizziness.    Physical Exam:  Vitals:   12/26/16 1543  BP: (!) 140/51  Pulse: 69  Temp: 98.1 F (36.7 C)  SpO2: 93%   General: Sitting comfortably, NAD HEENT: [place]/AT, EOMI, no scleral icterus Cardiac: RRR, No R/M/G appreciated Pulm: normal effort,  CTAB Abd: soft, non tender, non distended, BS normal Ext: extremities well perfused, no peripheral edema Neuro: alert and oriented X3, cranial nerves II-XII grossly intact   Assessment & Plan:   See Encounters Tab for problem based charting.  Patient seen with Dr. Rebeca Alert

## 2016-12-26 NOTE — Progress Notes (Signed)
Cardiology Office Note    Date:  12/27/2016   ID:  Jacqueline Nguyen, DOB 13-Jun-1931, MRN [id number]  PCP:  [name], MD  Cardiologist:   Sanda Klein, MD   chief complaint: Right heart failure follow-up   History of Present Illness:  Jacqueline Nguyen is a 83 y.o. female who presents for right heart failure (secondary to obstructive sleep apnea, previous pulmonary embolism, obesity) and reported paroxysmal atrial fibrillation, Not documented in a long time.. She is accompanied today by her son, as before.She now lives with him in their house on [address]  Her complaints are similar to those before. She has days when she feels weak. She has palpitations, most commonly at night. These are usually isolated skipped beats, not sustained arrhythmia. Whenever she feels unwell she checks her blood pressure and often finds that is either too high or her heart rate is too low. As before, she has a rather wide pulse pressure with the typical systolic blood pressure of around 580 and diastolic blood pressure of 60. Her heart rate is typically in the 50s or low 60s. Her palpitations will bother her a lot she takes as little as a quarter over 25 mg tablet of atenolol. She does this very infrequently.  Today when she was initially checked and her blood pressure is 157/64, when I rechecked about 10 minutes later blood pressure was 148/60. Heart rate was 60.  Previous monitoring has shown that she has frequent PACs and sometimes PVCs, with pauses sometimes up to 2.5 seconds in duration.  She has not had syncope and her edema seems to be well controlled. 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 She has not had angina and denies focal neurological events.  In 2009 she had a "massive" bilateral pulmonary embolism and she has long-standing systemic hypertension, morbid obesity, reactive airway disease, obstructive sleep apnea (on CPAP) and  osteoporosis and degenerative arthritis. She wears oxygen at night and with activity and often uses a scooter. In 2014 she had transient right eye blindness, not long after cataract surgery. Has glaucoma. Echo in August 2015 showed normal left ventricular regional wall motion and EF 65%, no significant valvular abnormalities, estimated systolic PA pressure 40 mm Hg. Her event monitor showed frequent PACs and brief bursts of nonsustained atrial tachycardia, but there was no confirmed atrial fibrillation.    Past Medical History:  Diagnosis Date  . Arthritis   . Cataract   . Chronic respiratory failure ([place])   . Endometriosis   . HTN (hypertension)   . Hx of echocardiogram    Echo (8/15):  Mild LVH, EF 65%, no RWMA, Ao sclerosis, no AS, mod MAC, mild LAE, normal RVSF, PASP 40 mmHg  . Hx pulmonary embolism    on chronic coumadin  . Obesities, morbid ([place])   . OSA (obstructive sleep apnea)   . Osteoporosis   . PAF (paroxysmal atrial fibrillation) ([place])    Event Monitor (8/15):  Frequent PACs, brief bursts of nonsustained ATach; no AFib  . Psoriasis   . Skin cancer   . Vertigo     Past Surgical History:  Procedure Laterality Date  . cataract surgery    . GALLBLADDER SURGERY    . NOSE SURGERY    . OOPHORECTOMY    . SKIN SURGERY    . UVULOPALATOPHARYNGOPLASTY      Current Medications: Outpatient Medications Prior to Visit  Medication Sig Dispense Refill  .  acetic acid-hydrocortisone (VOSOL-HC) otic solution Place 3 drops into the right ear 3 (three) times daily. For seven days. 10 mL 0  . amLODipine (NORVASC) 5 MG tablet Take 1 tablet (5 mg total) by mouth daily. 90 tablet 3  . Ascorbic Acid (VITAMIN C PO) Take 1 capsule by mouth daily.    Marland Kitchen atenolol (TENORMIN) 25 MG tablet Take 1 tablet (25 mg total) by mouth 4 (four) times daily as needed. MAY TAKE 1 TABLET 4 TIMES DAILY AS NEEDED. 120 tablet 3  . AZOPT 1 % ophthalmic suspension Place 1 drop into both eyes 2 (two) times daily.      . budesonide-formoterol (SYMBICORT) 160-4.5 MCG/ACT inhaler Inhale 2 puffs into the lungs 2 (two) times daily. 3 Inhaler 3  . Cholecalciferol (VITAMIN D PO) Take 1 capsule by mouth daily.    . COMBIGAN 0.2-0.5 % ophthalmic solution Place 1 drop into both eyes 2 (two) times daily.    . cyanocobalamin 1000 MCG tablet Take 1,000 mcg by mouth daily.    Marland Kitchen diltiazem (CARDIZEM CD) 120 MG 24 hr capsule TAKE ONE (1) CAPSULE BY MOUTH 2 TIMES DAILY 180 capsule 3  . DM-Doxylamine-Acetaminophen (NYQUIL COLD & FLU PO) Take 10 mLs by mouth daily as needed (mucus).    Marland Kitchen FLOVENT HFA 44 MCG/ACT inhaler INHALE 1 PUFF TWICE A DAY DAILY 10.6 Inhaler 6  . furosemide (LASIX) 20 MG tablet Take 1 tablet (20 mg total) by mouth daily. 90 tablet 1  . Homeopathic Products (ARNICARE ARNICA) CREA Apply 1 application topically daily.     Marland Kitchen HYDROcodone-acetaminophen (NORCO) 10-325 MG tablet Take 1 tablet by mouth every 6 (six) hours as needed for severe pain. 120 tablet 0  . levalbuterol (XOPENEX) 0.63 MG/3ML nebulizer solution Take 1 ampule by nebulization every 8 (eight) hours as needed for wheezing.    . magic mouthwash w/lidocaine SOLN Take 5 mLs by mouth 3 (three) times daily as needed for mouth pain. 140 mL 0  . meclizine (ANTIVERT) 25 MG tablet Take 1 tablet (25 mg total) by mouth 2 (two) times daily as needed for dizziness. TAKE ONE TABLET 2-3 TIMES A DAY AS NEEDED FOR VERTIGO 30 tablet 3  . Multiple Vitamin (MULTIVITAMIN WITH MINERALS) TABS tablet Take 1 tablet by mouth daily.    . NON FORMULARY Place 4 L into the nose at bedtime. 3 liter as needed during day depending on extertion     . NON FORMULARY Apply 1 application topically daily as needed. [facility] Herbal Heal Ointment for damaged skin    . NON FORMULARY Take 1-2 sprays by mouth daily as needed. Mouth Kote Dry Mouth Spray    . Nystatin ([place]) 100000 UNIT/GM POWD Apply topically.    Marland Kitchen OVER THE COUNTER MEDICATION "Calmer" sleep aid - Take 1 tablet by  mouth daily as needed for sleep.    Marland Kitchen Respiratory Therapy Supplies (FLUTTER) DEVI Blow through 4 times per set, three sets daily 1 each 0  . warfarin (COUMADIN) 2.5 MG tablet Take 1 tablet (2.5 mg total) by mouth daily. 31 tablet 3   No facility-administered medications prior to visit.      Allergies:   Ciprofloxacin; Bactrim; and Codeine   Social History   Social History  . Marital status: Widowed    Spouse name: N/A  . Number of children: N/A  . Years of education: N/A   Social History Main Topics  . Smoking status: Former Smoker    Packs/day: 1.00  Years: 2.00    Types: Cigarettes    Quit date: 04/03/1981  . Smokeless tobacco: Never Used  . Alcohol use No  . Drug use: No  . Sexual activity: Not Currently   Other Topics Concern  . Not on file   Social History Narrative   She lives with her sister in [place] and requires assistance at home. Has an aid who visits and helps at home often. Son works as a Catering manager and also visits and helps when he can. Semi-independent in ADLs, requires walker around home, dependent in IADLs.     Family History:  The patient's family history includes Heart attack in her brother and father.   ROS:   Please see the history of present illness.    ROS All other systems reviewed and are negative.   PHYSICAL EXAM:   VS:  BP (!) 157/64   Pulse 62   Ht _0  (1.651 m)   Wt 253 lb (114.8 kg)   SpO2 93%   BMI 42.10 kg/m    GEN: Morbidly obese, well developed, in no acute distress  HEENT: normal  Neck: no JVD, carotid bruits, or masses Cardiac: RRR; 2/6 holosystolic murmur at the left lower sternal border, no diastolic murmurs, rubs, or gallops, 1-2+ pedal edema  Respiratory:  clear to auscultation bilaterally, normal work of breathing GI: soft, nontender, nondistended, + BS MS: no deformity or atrophy  Skin: warm and dry, no rash Neuro:  Alert and Oriented x 3, Strength and sensation are intact Psych: euthymic mood, full  affect  Wt Readings from Last 3 Encounters:  12/26/16 253 lb (114.8 kg)  12/06/16 254 lb 9.6 oz (115.5 kg)  09/20/16 250 lb 4.8 oz (113.5 kg)      Studies/Labs Reviewed:   EKG:  EKG is ordered today.  The ekg ordered today demonstrates Mild sinus bradycardia, poor R-wave progression, otherwise normal  Recent Labs: 06/28/2016: ALT 10; B Natriuretic Peptide 246.5; Magnesium 2.0 12/06/2016: BUN 13; Creatinine, Ser 0.64; Hemoglobin 12.8; Platelets 365; Potassium 4.2; Sodium 146; TSH 1.300   Lipid Panel    Component Value Date/Time   CHOL 205 (H) 08/30/2012 0550   TRIG 102 08/30/2012 0550   HDL 55 08/30/2012 0550   CHOLHDL 3.7 08/30/2012 0550   VLDL 20 08/30/2012 0550   LDLCALC 130 (H) 08/30/2012 0550     ASSESSMENT:    1. Chronic right-sided CHF (congestive heart failure) ([place])   2. OSA (obstructive sleep apnea)   3. Pulmonary hypertension ([place])   4. Chronic respiratory failure with hypoxia ([facility])   5. PAF (paroxysmal atrial fibrillation) ([place])   6. Long term current use of anticoagulant   7. Muscular deconditioning   8. Essential hypertension      PLAN:  In order of problems listed above:  1. Right HF: As always her exam is limited by morbid obesity but as far as I can tell she is at euvolemic status. Her weight has been very steady this year Multifactorial related to previous pulmonary embolism, severe obesity, sleep apnea, chronic hypoxia. 2. OSA: Monitored by Dr. Annamaria Boots. The patient reports compliance with CPAP 3. PAH: Echo estimation shows this to be relatively mild, around 40 mmHg 4. Chronic hypoxemia : On home oxygen 3 L by nasal cannula. Has a history of smoking and probably has COPD although her PFTs showed matched reduction in FVC and FEV1 under 50% of predicted. Clearly there is a component of obesity hypoventilation syndrome.  5. Palpitations: There is  a remote report of atrial fibrillation, but not in several years. She is to be on anticoagulation for her history  of pulmonary embolism anyway. Most of her palpitations seem to be related to isolated PACs and PVCs. Because of resting bradycardia, I would avoid increasing the dose of diltiazem or adding more beta blocker: this might lead to need for pacemaker. Her palpitations are not severe enough to warrant implantation of a device. 6. Warfarin: No bleeding complications. Well-tolerated. 7. 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 Hopefully she is still getting some exercise to avoid further deconditioning. 8. HTN: She has a wide pulse pressure, a sign of poor arterial compliance. I think we have to tolerated degree of systolic hypertension to avoid symptomatic diastolic hypotension I would not adjust rent hypertensive medications unless her systolic blood pressures consistently over 160. 9. Morbid obesity   Medication Adjustments/Labs and Tests Ordered: Current medicines are reviewed at length with the patient today.  Concerns regarding medicines are outlined above.  Medication changes, Labs and Tests ordered today are listed in the Patient Instructions below. Patient Instructions  Dr Sallyanne Kuster recommends that you schedule a follow-up appointment in 6 months. You will receive a reminder letter in the mail two months in advance. If you don't receive a letter, please call our office to schedule the follow-up appointment.  If you need a refill on your cardiac medications before your next appointment, please call your pharmacy.    Signed, Sanda Klein, MD  12/27/2016 8:54 PM    [address], [place], [place]  [address] Phone: [phone]; Fax: [phone]

## 2016-12-26 NOTE — Patient Instructions (Addendum)
Ms. Dudziak,   It was a pleasure meeting you today.  Do not start taking the medication Amlodipine since you have not been taking it.   Please make an appointment with Dr. Daryll Drown to discuss your blood pressure medications.   Record your blood pressures and bring the log with you to your next appointment (include Date, Time, and blood pressure reading). You can also call the clinic with several blood pressure readings, which will be sent to Dr. Daryll Drown.

## 2016-12-26 NOTE — Patient Instructions (Signed)
Dr Croitoru recommends that you schedule a follow-up appointment in 6 months. You will receive a reminder letter in the mail two months in advance. If you don't receive a letter, please call our office to schedule the follow-up appointment.  If you need a refill on your cardiac medications before your next appointment, please call your pharmacy. 

## 2016-12-26 NOTE — Progress Notes (Addendum)
Anticoagulation Management Jacqueline Nguyen is a 83 y.o. female who reports to the clinic for monitoring of warfarin treatment.    Indication: DVT and PEhistory, paroxysmal atrial fibrillation Duration: indefinite Supervising physician: Lenice Pressman  Patient reports no signs or symptoms of bleeding or thromboembolism. Anticoagulation Episode Summary    Current INR goal:   2.0-3.0  TTR:   74.6 % (5 mo)  Next INR check:   01/01/2017  INR from last check:   2.1 (12/26/2016)  Weekly max warfarin dose:     Target end date:     INR check location:     Preferred lab:     Send INR reminders to:      Indications   History of DVT (deep vein thrombosis) [Z86.718] Anticoagulated on Coumadin [Z51.81 Z79.01] History of pulmonary embolism [Z86.711]       Comments:         Anticoagulation Care Providers    Provider Role Specialty Phone number   [name], MD Responsible Internal Medicine [phone]     Allergies  Allergen Reactions  . Ciprofloxacin   . Bactrim Rash  . Codeine Nausea Only   Medication Sig  acetic acid-hydrocortisone (VOSOL-HC) otic solution Place 3 drops into the right ear 3 (three) times daily. For seven days.  amLODipine (NORVASC) 5 MG tablet Take 1 tablet (5 mg total) by mouth daily.  Ascorbic Acid (VITAMIN C PO) Take 1 capsule by mouth daily.  atenolol (TENORMIN) 25 MG tablet Take 1 tablet (25 mg total) by mouth 4 (four) times daily as needed. MAY TAKE 1 TABLET 4 TIMES DAILY AS NEEDED.  AZOPT 1 % ophthalmic suspension Place 1 drop into both eyes 2 (two) times daily.  budesonide-formoterol (SYMBICORT) 160-4.5 MCG/ACT inhaler Inhale 2 puffs into the lungs 2 (two) times daily.  Cholecalciferol (VITAMIN D PO) Take 1 capsule by mouth daily.  COMBIGAN 0.2-0.5 % ophthalmic solution Place 1 drop into both eyes 2 (two) times daily.  cyanocobalamin 1000 MCG tablet Take 1,000 mcg by mouth daily.  diltiazem (CARDIZEM CD) 120 MG 24 hr capsule TAKE ONE (1) CAPSULE BY  MOUTH 2 TIMES DAILY  DM-Doxylamine-Acetaminophen (NYQUIL COLD & FLU PO) Take 10 mLs by mouth daily as needed (mucus).  FLOVENT HFA 44 MCG/ACT inhaler INHALE 1 PUFF TWICE A DAY DAILY  furosemide (LASIX) 20 MG tablet Take 1 tablet (20 mg total) by mouth daily.  Homeopathic Products (ARNICARE ARNICA) CREA Apply 1 application topically daily.   HYDROcodone-acetaminophen (NORCO) 10-325 MG tablet Take 1 tablet by mouth every 6 (six) hours as needed for severe pain.  levalbuterol (XOPENEX) 0.63 MG/3ML nebulizer solution Take 1 ampule by nebulization every 8 (eight) hours as needed for wheezing.  magic mouthwash w/lidocaine SOLN Take 5 mLs by mouth 3 (three) times daily as needed for mouth pain.  meclizine (ANTIVERT) 25 MG tablet Take 1 tablet (25 mg total) by mouth 2 (two) times daily as needed for dizziness. TAKE ONE TABLET 2-3 TIMES A DAY AS NEEDED FOR VERTIGO  Multiple Vitamin (MULTIVITAMIN WITH MINERALS) TABS tablet Take 1 tablet by mouth daily.  NON FORMULARY Place 4 L into the nose at bedtime. 3 liter as needed during day depending on extertion   NON FORMULARY Apply 1 application topically daily as needed. [facility] Herbal Heal Ointment for damaged skin  NON FORMULARY Take 1-2 sprays by mouth daily as needed. Mouth Kote Dry Mouth Spray  Nystatin (NYAMYC) 100000 UNIT/GM POWD Apply topically.  OVER THE COUNTER MEDICATION "Calmer" sleep aid -  Take 1 tablet by mouth daily as needed for sleep.  Respiratory Therapy Supplies (FLUTTER) DEVI Blow through 4 times per set, three sets daily  warfarin (COUMADIN) 2.5 MG tablet Take 1 tablet (2.5 mg total) by mouth daily.   Past Medical History:  Diagnosis Date  . Arthritis   . Cataract   . Chronic respiratory failure ([place])   . Endometriosis   . HTN (hypertension)   . Hx of echocardiogram    Echo (8/15):  Mild LVH, EF 65%, no RWMA, Ao sclerosis, no AS, mod MAC, mild LAE, normal RVSF, PASP 40 mmHg  . Hx pulmonary embolism    on chronic coumadin   . Obesities, morbid ([place])   . OSA (obstructive sleep apnea)   . Osteoporosis   . PAF (paroxysmal atrial fibrillation) ([place])    Event Monitor (8/15):  Frequent PACs, brief bursts of nonsustained ATach; no AFib  . Psoriasis   . Skin cancer   . Vertigo    Social History   Social History  . Marital status: Widowed    Spouse name: N/A  . Number of children: N/A  . Years of education: N/A   Social History Main Topics  . Smoking status: Former Smoker    Packs/day: 1.00    Years: 2.00    Types: Cigarettes    Quit date: 04/03/1981  . Smokeless tobacco: Never Used  . Alcohol use No  . Drug use: No  . Sexual activity: Not Currently   Other Topics Concern  . Not on file   Social History Narrative   She lives with her sister in [place] and requires assistance at home. Has an aid who visits and helps at home often. Son works as a Catering manager and also visits and helps when he can. Semi-independent in ADLs, requires walker around home, dependent in IADLs.   Family History  Problem Relation Age of Onset  . Heart attack Father   . Heart attack Brother   . Stroke Neg Hx    ASSESSMENT Recent Results: The most recent result is correlated with 17.5 mg per week: Lab Results  Component Value Date   INR 2.1 12/26/2016   INR 2.0 12/18/2016   INR 2.4 12/11/2016   Anticoagulation Dosing: INR as of 12/26/2016 and Previous Warfarin Dosing Information    INR Dt INR Goal [employer] Sun Mon Tue Wed Thu Fri Sat   12/26/2016 2.1 2.0-3.0 17.5 mg 2.5 mg 2.5 mg 2.5 mg 2.5 mg 2.5 mg 2.5 mg 2.5 mg    Anticoagulation Warfarin Dose Instructions as of 12/26/2016      Total Sun Mon Tue Wed Thu Fri Sat   New Dose 17.5 mg 2.5 mg 2.5 mg 2.5 mg 2.5 mg 2.5 mg 2.5 mg 2.5 mg     (2.5 mg x 1)  (2.5 mg x 1)  (2.5 mg x 1)  (2.5 mg x 1)  (2.5 mg x 1)  (2.5 mg x 1)  (2.5 mg x 1)                           INR today: Therapeutic  PLAN Weekly dose was unchanged. Patient requested warfarin refill,  will process prescription.  Patient Instructions  Patient educated about medication as defined in this encounter and verbalized understanding by repeating back instructions provided.   Patient advised to contact clinic or seek medical attention if signs/symptoms of bleeding or thromboembolism occur.  Patient verbalized understanding by  repeating back information and was advised to contact me if further medication-related questions arise. Patient was also provided an information handout.  Follow-up Return in about 1 week (around 01/02/2017).  [name]

## 2016-12-26 NOTE — Patient Instructions (Signed)
Patient educated about medication as defined in this encounter and verbalized understanding by repeating back instructions provided.

## 2016-12-26 NOTE — Assessment & Plan Note (Signed)
Received flu vaccination 12/26/2016.

## 2016-12-26 NOTE — Progress Notes (Addendum)
Anticoagulation Management Jacqueline Nguyen is a 83 y.o. female who reports to the clinic for monitoring of warfarin treatment.    Indication: DVT and PEhistory, paroxysmal atrial fibrillation Duration: indefinite Supervising physician: Lenice Pressman  Patient reports no signs or symptoms of bleeding or thromboembolism.  Lab Results  Component Value Date   INR 2.1 12/26/2016   INR 2.0 12/18/2016   INR 2.4 12/11/2016   Anticoagulation Dosing:  INR Dt INR Goal Wkly Tot Sun Mon Tue Wed Thu Fri Sat   12/26/2016 2.1 2.0-3.0 17.5 mg 2.5 mg 2.5 mg 2.5 mg 2.5 mg 2.5 mg 2.5 mg 2.5 mg    INR today: Therapeutic  PLAN Weekly dose was unchanged. Patient requested warfarin refill, will process prescription.  Patient advised to contact clinic or seek medical attention if signs/symptoms of bleeding or thromboembolism occur. Patient verbalized understanding by repeating back information and was advised to contact me if further medication-related questions arise. Patient was also provided an information handout.  Follow-up Return in about 1 week (around 01/02/2017).  [name]

## 2016-12-26 NOTE — Assessment & Plan Note (Signed)
BP Readings from Last 3 Encounters:  12/26/16 (!) 140/51  12/06/16 (!) 161/73  09/20/16 (!) 146/48  Patient's blood pressure difficult to assess because she did not bring her BP log with her to the appointment. She states her systolic readings are typically elevated in the 160-170s, but seems very concerned with low pressures. The patient has not been taking the Amlodipine 5 mg consistently and had only taken a few times due to side effects. She states adherence/consistentcy with Cardizem.   Plan: -Stop Amlodipine until next appointment with Dr. Daryll Drown since not taking consistently -Record BP and bring a log to next appointment -Recommended follow up with Dr. Daryll Drown in 1-2 months to discuss blood pressure management

## 2016-12-26 NOTE — Assessment & Plan Note (Signed)
INR 2.1 today, weekly dose unchanged per pharmacy.

## 2016-12-27 ENCOUNTER — Encounter: Payer: Self-pay | Admitting: Cardiovascular Disease

## 2016-12-27 NOTE — Progress Notes (Signed)
I have reviewed Dr. Julianne Rice documentation and agree with plan.

## 2016-12-28 NOTE — Progress Notes (Signed)
Internal Medicine Clinic Attending  I saw and evaluated the patient.  I personally confirmed the key portions of the history and exam documented by Dr. Aggie Hacker and I reviewed pertinent patient test results.  The assessment, diagnosis, and plan were formulated together and I agree with the documentation in the resident's note.  Together, we had an extensive counseling discussion regarding her blood pressure medications. I encouraged her to be consistent with taking her medications to avoid wide fluctuations in her blood pressure. Her wide pulse pressure does put her at higher risk for adverse effects of blood pressure medications. She was going to her cardiology appointment after, and can readdress her blood pressure regimen with Dr. Daryll Drown at her next appointment soon.  Oda Kilts, MD

## 2017-01-02 ENCOUNTER — Telehealth: Payer: Self-pay

## 2017-01-02 ENCOUNTER — Telehealth: Payer: Self-pay | Admitting: Internal Medicine

## 2017-01-02 DIAGNOSIS — J9611 Chronic respiratory failure with hypoxia: Secondary | ICD-10-CM

## 2017-01-02 DIAGNOSIS — I272 Pulmonary hypertension, unspecified: Secondary | ICD-10-CM

## 2017-01-02 NOTE — Telephone Encounter (Signed)
Pt states she is having a lot of drainage, mucous is thick and causing her to not be able to swallow when she eats and what she does swallow comes back up because it feels stuck in her throat. She denies fevers. States it happen a few days ago and got better but now back to it, she states she had her teeth cleaned last visit to [facility] about 2 weeks ago and it caused some stress to tissue under her tongue, she doesn't know if these are related. Also she states she needs to see dr Tarri Fuller young because it's been awhile, doesn't know if she needs a referral. Also states when she saw cardiologist he told her to stop taking the new medicine you told her to start, she cannot remember which one, reviewed note, amlodipine? Please advise.

## 2017-01-02 NOTE — Telephone Encounter (Signed)
HAVING DRAINAGE DOWN THROAT FOR DAYS

## 2017-01-02 NOTE — Telephone Encounter (Signed)
Requesting to speak with Bonnita Nasuti. Please call pt back.

## 2017-01-03 ENCOUNTER — Encounter: Payer: Self-pay | Admitting: Pharmacist

## 2017-01-03 DIAGNOSIS — Z86718 Personal history of other venous thrombosis and embolism: Secondary | ICD-10-CM

## 2017-01-03 DIAGNOSIS — Z7901 Long term (current) use of anticoagulants: Secondary | ICD-10-CM

## 2017-01-03 DIAGNOSIS — Z86711 Personal history of pulmonary embolism: Secondary | ICD-10-CM

## 2017-01-03 LAB — POCT INR: INR: 2.6

## 2017-01-03 NOTE — Patient Instructions (Signed)
Patient educated about medication as defined in this encounter and verbalized understanding by repeating back instructions provided.   

## 2017-01-03 NOTE — Telephone Encounter (Signed)
Placed referral for Dr. Annamaria Boots.  Can you route to right person.  Thanks.

## 2017-01-03 NOTE — Telephone Encounter (Signed)
Reviewed cardiology note.  Amlodipine stopped by our clinic.  Please schedule her to see me for HTN in the next month if she is not already scheduled.  I will place a consult for Pulmonology.  As for the drainage and mucous, recommendation from previous communication remains the same.

## 2017-01-03 NOTE — Progress Notes (Signed)
Anticoagulation Management Jacqueline Nguyen a 83 y.o.femalewho is on warfarintreatment.Patient has home INR meter.  Indication: DVT and PEhistory Duration: indefinite Supervising physician: Gilles Chiquito  Patient reports no signs or symptoms of bleeding or thromboembolism.  Anticoagulation Episode Summary    Current INR goal:   2.0-3.0  TTR:   75.5 % (5.2 mo)  Next INR check:   01/08/2017  INR from last check:   2.6 (01/01/2017)  Weekly max warfarin dose:     Target end date:     INR check location:     Preferred lab:     Send INR reminders to:      Indications   History of DVT (deep vein thrombosis) [Z86.718] Anticoagulated on Coumadin [Z51.81 Z79.01] History of pulmonary embolism [Z86.711]       Comments:         Anticoagulation Care Providers    Provider Role Specialty Phone number   [name], MD Responsible Internal Medicine [phone]      Allergies  Allergen Reactions  . Ciprofloxacin   . Bactrim Rash  . Codeine Nausea Only   Medication Sig  acetic acid-hydrocortisone (VOSOL-HC) otic solution Place 3 drops into the right ear 3 (three) times daily. For seven days.  amLODipine (NORVASC) 5 MG tablet Take 1 tablet (5 mg total) by mouth daily.  Ascorbic Acid (VITAMIN C PO) Take 1 capsule by mouth daily.  atenolol (TENORMIN) 25 MG tablet Take 1 tablet (25 mg total) by mouth 4 (four) times daily as needed. MAY TAKE 1 TABLET 4 TIMES DAILY AS NEEDED.  AZOPT 1 % ophthalmic suspension Place 1 drop into both eyes 2 (two) times daily.  budesonide-formoterol (SYMBICORT) 160-4.5 MCG/ACT inhaler Inhale 2 puffs into the lungs 2 (two) times daily.  Cholecalciferol (VITAMIN D PO) Take 1 capsule by mouth daily.  COMBIGAN 0.2-0.5 % ophthalmic solution Place 1 drop into both eyes 2 (two) times daily.  cyanocobalamin 1000 MCG tablet Take 1,000 mcg by mouth daily.  diltiazem (CARDIZEM CD) 120 MG 24 hr capsule TAKE ONE (1) CAPSULE BY MOUTH 2 TIMES DAILY   DM-Doxylamine-Acetaminophen (NYQUIL COLD & FLU PO) Take 10 mLs by mouth daily as needed (mucus).  FLOVENT HFA 44 MCG/ACT inhaler INHALE 1 PUFF TWICE A DAY DAILY  furosemide (LASIX) 20 MG tablet Take 1 tablet (20 mg total) by mouth daily.  Homeopathic Products (ARNICARE ARNICA) CREA Apply 1 application topically daily.   HYDROcodone-acetaminophen (NORCO) 10-325 MG tablet Take 1 tablet by mouth every 6 (six) hours as needed for severe pain.  levalbuterol (XOPENEX) 0.63 MG/3ML nebulizer solution Take 1 ampule by nebulization every 8 (eight) hours as needed for wheezing.  magic mouthwash w/lidocaine SOLN Take 5 mLs by mouth 3 (three) times daily as needed for mouth pain.  meclizine (ANTIVERT) 25 MG tablet Take 1 tablet (25 mg total) by mouth 2 (two) times daily as needed for dizziness. TAKE ONE TABLET 2-3 TIMES A DAY AS NEEDED FOR VERTIGO  Multiple Vitamin (MULTIVITAMIN WITH MINERALS) TABS tablet Take 1 tablet by mouth daily.  NON FORMULARY Place 4 L into the nose at bedtime. 3 liter as needed during day depending on extertion   NON FORMULARY Apply 1 application topically daily as needed. [facility] Herbal Heal Ointment for damaged skin  NON FORMULARY Take 1-2 sprays by mouth daily as needed. Mouth Kote Dry Mouth Spray  Nystatin (NYAMYC) 100000 UNIT/GM POWD Apply topically.  OVER THE COUNTER MEDICATION "Calmer" sleep aid - Take 1 tablet by mouth daily as  needed for sleep.  Respiratory Therapy Supplies (FLUTTER) DEVI Blow through 4 times per set, three sets daily  warfarin (COUMADIN) 2.5 MG tablet Take 1 tablet (2.5 mg total) by mouth daily.   Past Medical History:  Diagnosis Date  . Arthritis   . Cataract   . Chronic respiratory failure ([place])   . Endometriosis   . HTN (hypertension)   . Hx of echocardiogram    Echo (8/15):  Mild LVH, EF 65%, no RWMA, Ao sclerosis, no AS, mod MAC, mild LAE, normal RVSF, PASP 40 mmHg  . Hx pulmonary embolism    on chronic coumadin  . Obesities,  morbid ([place])   . OSA (obstructive sleep apnea)   . Osteoporosis   . PAF (paroxysmal atrial fibrillation) ([place])    Event Monitor (8/15):  Frequent PACs, brief bursts of nonsustained ATach; no AFib  . Psoriasis   . Skin cancer   . Vertigo    Social History   Social History  . Marital status: Widowed    Spouse name: N/A  . Number of children: N/A  . Years of education: N/A   Social History Main Topics  . Smoking status: Former Smoker    Packs/day: 1.00    Years: 2.00    Types: Cigarettes    Quit date: 04/03/1981  . Smokeless tobacco: Never Used  . Alcohol use No  . Drug use: No  . Sexual activity: Not Currently   Other Topics Concern  . Not on file   Social History Narrative   She lives with her sister in [place] and requires assistance at home. Has an aid who visits and helps at home often. Son works as a Catering manager and also visits and helps when he can. Semi-independent in ADLs, requires walker around home, dependent in IADLs.   Family History  Problem Relation Age of Onset  . Heart attack Father   . Heart attack Brother   . Stroke Neg Hx    ASSESSMENT Recent Results: The most recent result is correlated with 17.5 mg per week: Lab Results  Component Value Date   INR 2.6 01/01/2017   INR 2.1 12/26/2016   INR 2.0 12/18/2016   Anticoagulation Dosing: INR as of 01/03/2017 and Previous Warfarin Dosing Information    INR Dt INR Goal [employer] Sun Mon Tue Wed Thu Fri Sat   01/01/2017 2.6 2.0-3.0 17.5 mg 2.5 mg 2.5 mg 2.5 mg 2.5 mg 2.5 mg 2.5 mg 2.5 mg    Anticoagulation Warfarin Dose Instructions as of 01/03/2017      Total Sun Mon Tue Wed Thu Fri Sat   New Dose 17.5 mg 2.5 mg 2.5 mg 2.5 mg 2.5 mg 2.5 mg 2.5 mg 2.5 mg     (2.5 mg x 1)  (2.5 mg x 1)  (2.5 mg x 1)  (2.5 mg x 1)  (2.5 mg x 1)  (2.5 mg x 1)  (2.5 mg x 1)                           INR today: Therapeutic  PLAN Weekly dose was unchanged   Patient Instructions  Patient educated about  medication as defined in this encounter and verbalized understanding by repeating back instructions provided.    Patient advised to contact clinic or seek medical attention if signs/symptoms of bleeding or thromboembolism occur.  Patient verbalized understanding by repeating back information and was advised to contact me if further medication-related  questions arise. Patient was also provided an information handout.  Follow-up Return in about 5 days (around 01/08/2017).  [name]

## 2017-01-03 NOTE — Telephone Encounter (Signed)
I do not have enough information to make a diagnosis. Are we able to see her cardiology notes.  If she is having increased mucous and issues clearing her throat I would recommend her being seen either in [facility] in [facility] or here.

## 2017-01-04 NOTE — Telephone Encounter (Signed)
See other note from this day

## 2017-01-05 NOTE — Progress Notes (Signed)
I reviewed Dr. Julianne Rice note and agree with plan.

## 2017-01-08 ENCOUNTER — Encounter (HOSPITAL_COMMUNITY): Payer: Self-pay

## 2017-01-08 ENCOUNTER — Other Ambulatory Visit: Payer: Medicare Other

## 2017-01-08 ENCOUNTER — Ambulatory Visit (HOSPITAL_COMMUNITY)
Admission: RE | Admit: 2017-01-08 | Discharge: 2017-01-08 | Disposition: A | Discharge status: Home / Self Care | Payer: Medicare Other | Source: Ambulatory Visit | Attending: Internal Medicine | Admitting: Internal Medicine

## 2017-01-08 ENCOUNTER — Telehealth: Payer: Self-pay | Admitting: Internal Medicine

## 2017-01-08 DIAGNOSIS — R079 Chest pain, unspecified: Secondary | ICD-10-CM

## 2017-01-08 DIAGNOSIS — J9811 Atelectasis: Secondary | ICD-10-CM | POA: Insufficient documentation

## 2017-01-08 DIAGNOSIS — I2510 Atherosclerotic heart disease of native coronary artery without angina pectoris: Secondary | ICD-10-CM | POA: Diagnosis not present

## 2017-01-08 DIAGNOSIS — I421 Obstructive hypertrophic cardiomyopathy: Secondary | ICD-10-CM

## 2017-01-08 DIAGNOSIS — R918 Other nonspecific abnormal finding of lung field: Secondary | ICD-10-CM | POA: Diagnosis not present

## 2017-01-08 DIAGNOSIS — I700 Atherosclerosis of aorta: Secondary | ICD-10-CM | POA: Diagnosis not present

## 2017-01-08 DIAGNOSIS — R590 Localized enlarged lymph nodes: Secondary | ICD-10-CM

## 2017-01-08 DIAGNOSIS — K7689 Other specified diseases of liver: Secondary | ICD-10-CM | POA: Diagnosis not present

## 2017-01-08 DIAGNOSIS — R0602 Shortness of breath: Secondary | ICD-10-CM

## 2017-01-08 DIAGNOSIS — I517 Cardiomegaly: Secondary | ICD-10-CM | POA: Diagnosis not present

## 2017-01-08 DIAGNOSIS — R599 Enlarged lymph nodes, unspecified: Secondary | ICD-10-CM

## 2017-01-08 LAB — D-DIMER, QUANTITATIVE: D-Dimer, Quant: 0.49 mcg/mL FEU (ref <0.50)

## 2017-01-08 MED ORDER — IOPAMIDOL (ISOVUE-370) INJECTION 76%
INTRAVENOUS | Status: AC
  Filled 2017-01-08: qty 100

## 2017-01-08 MED ORDER — IOPAMIDOL (ISOVUE-370) INJECTION 76%
100.0000 mL | Freq: Once | INTRAVENOUS | Status: AC | PRN
  Administered 2017-01-08: 100 mL via INTRAVENOUS

## 2017-01-08 NOTE — Telephone Encounter (Signed)
Spoke with pt, who reports of increased sob & chest discomfort. x2-3d. Pt feels that her lungs are unable to expand fully. Pt is concerned about PE, as she has an hx. Pt states she is prescribed oxygen qhs, however she is having to use oxygen at 3L during the day. Pt is requesting an apt this afternoon with CY.  CY please advise. Thanks.   Current Outpatient Prescriptions on File Prior to Visit  Medication Sig Dispense Refill  . acetic acid-hydrocortisone (VOSOL-HC) otic solution Place 3 drops into the right ear 3 (three) times daily. For seven days. 10 mL 0  . amLODipine (NORVASC) 5 MG tablet Take 1 tablet (5 mg total) by mouth daily. 90 tablet 3  . Ascorbic Acid (VITAMIN C PO) Take 1 capsule by mouth daily.    Marland Kitchen atenolol (TENORMIN) 25 MG tablet Take 1 tablet (25 mg total) by mouth 4 (four) times daily as needed. MAY TAKE 1 TABLET 4 TIMES DAILY AS NEEDED. 120 tablet 3  . AZOPT 1 % ophthalmic suspension Place 1 drop into both eyes 2 (two) times daily.    . budesonide-formoterol (SYMBICORT) 160-4.5 MCG/ACT inhaler Inhale 2 puffs into the lungs 2 (two) times daily. 3 Inhaler 3  . Cholecalciferol (VITAMIN D PO) Take 1 capsule by mouth daily.    . COMBIGAN 0.2-0.5 % ophthalmic solution Place 1 drop into both eyes 2 (two) times daily.    . cyanocobalamin 1000 MCG tablet Take 1,000 mcg by mouth daily.    Marland Kitchen diltiazem (CARDIZEM CD) 120 MG 24 hr capsule TAKE ONE (1) CAPSULE BY MOUTH 2 TIMES DAILY 180 capsule 3  . DM-Doxylamine-Acetaminophen (NYQUIL COLD & FLU PO) Take 10 mLs by mouth daily as needed (mucus).    Marland Kitchen FLOVENT HFA 44 MCG/ACT inhaler INHALE 1 PUFF TWICE A DAY DAILY 10.6 Inhaler 6  . furosemide (LASIX) 20 MG tablet Take 1 tablet (20 mg total) by mouth daily. 90 tablet 1  . Homeopathic Products (ARNICARE ARNICA) CREA Apply 1 application topically daily.     Marland Kitchen HYDROcodone-acetaminophen (NORCO) 10-325 MG tablet Take 1 tablet by mouth every 6 (six) hours as needed for severe pain. 120 tablet 0  .  levalbuterol (XOPENEX) 0.63 MG/3ML nebulizer solution Take 1 ampule by nebulization every 8 (eight) hours as needed for wheezing.    . magic mouthwash w/lidocaine SOLN Take 5 mLs by mouth 3 (three) times daily as needed for mouth pain. 140 mL 0  . meclizine (ANTIVERT) 25 MG tablet Take 1 tablet (25 mg total) by mouth 2 (two) times daily as needed for dizziness. TAKE ONE TABLET 2-3 TIMES A DAY AS NEEDED FOR VERTIGO 30 tablet 3  . Multiple Vitamin (MULTIVITAMIN WITH MINERALS) TABS tablet Take 1 tablet by mouth daily.    . NON FORMULARY Place 4 L into the nose at bedtime. 3 liter as needed during day depending on extertion     . NON FORMULARY Apply 1 application topically daily as needed. [facility] Herbal Heal Ointment for damaged skin    . NON FORMULARY Take 1-2 sprays by mouth daily as needed. Mouth Kote Dry Mouth Spray    . Nystatin ([place]) 100000 UNIT/GM POWD Apply topically.    Marland Kitchen OVER THE COUNTER MEDICATION "Calmer" sleep aid - Take 1 tablet by mouth daily as needed for sleep.    Marland Kitchen Respiratory Therapy Supplies (FLUTTER) DEVI Blow through 4 times per set, three sets daily 1 each 0  . warfarin (COUMADIN) 2.5 MG tablet Take 1  tablet (2.5 mg total) by mouth daily. 31 tablet 3   No current facility-administered medications on file prior to visit.     Allergies  Allergen Reactions  . Ciprofloxacin   . Bactrim Rash  . Codeine Nausea Only

## 2017-01-08 NOTE — Telephone Encounter (Signed)
If she suspects another clot, and this isn't an obvious chest cold with sore throat, green, fever etc, then we had better order labs- D-dimer and BMET, please runs stat.    And order CTa chest for dx pulmonary embolism suspected

## 2017-01-08 NOTE — Telephone Encounter (Signed)
Spoke with pt, advised message from CY. Placed orders STAT and pt states she will come in to get blood work and placed CT at [facility] since she is 3 hours away. Can we see if she can get her CT while she is here. She states it will take at least 3 hours to get here anyway. Please call her son cell phone [phone] [facility]

## 2017-01-08 NOTE — Telephone Encounter (Signed)
Spoke with pt, advised message from Dr. Annamaria Boots on result note. Pt understood and will call back in the morning to follow up. She wants to know what she should do with her breathing in the meantime. She is SOB and feels like she may have a virus. I reassured her that the labs and CT was normal. Please advise so I can call pt in the AM.

## 2017-01-09 ENCOUNTER — Encounter: Payer: Self-pay | Admitting: Pharmacist

## 2017-01-09 DIAGNOSIS — Z86711 Personal history of pulmonary embolism: Secondary | ICD-10-CM

## 2017-01-09 DIAGNOSIS — Z86718 Personal history of other venous thrombosis and embolism: Secondary | ICD-10-CM

## 2017-01-09 DIAGNOSIS — Z7901 Long term (current) use of anticoagulants: Secondary | ICD-10-CM

## 2017-01-09 LAB — POCT INR: INR: 2

## 2017-01-09 MED ORDER — PREDNISONE 10 MG PO TABS: ORAL_TABLET | ORAL | 0 refills | Status: DC

## 2017-01-09 NOTE — Telephone Encounter (Signed)
Pt aware of CY's recommendations and voiced her understanding. Rx has been sent to preferred pharmacy. CT has been ordered. Nothing further needed.

## 2017-01-09 NOTE — Telephone Encounter (Signed)
Offer prednisone 10 mg, # 20, 4 X 2 DAYS, 3 X 2 DAYS, 2 X 2 DAYS, 1 X 2 DAYS  Use routine breathing meds and otc cold and flu symptom meds as needed.

## 2017-01-09 NOTE — Patient Instructions (Signed)
Patient educated about medication as defined in this encounter and verbalized understanding by repeating back instructions provided.

## 2017-01-09 NOTE — Progress Notes (Signed)
Anticoagulation Management Jacqueline Nguyen is a 83 y.o. female who reports to the clinic for monitoring of warfarin treatment.    Indication: DVT and PEhistory, paroxysmal atrial fibrillation Duration: indefinite Supervising physician: Gilles Chiquito  Patient reports no signs or symptoms of bleeding or thromboembolism.  Anticoagulation Episode Summary    Current INR goal:   2.0-3.0  TTR:   76.7 % (5.5 mo)  Next INR check:   01/15/2017  INR from last check:     Weekly max warfarin dose:     Target end date:     INR check location:     Preferred lab:     Send INR reminders to:      Indications   History of DVT (deep vein thrombosis) [Z86.718] Anticoagulated on Coumadin [Z51.81 Z79.01] History of pulmonary embolism [Z86.711]       Comments:         Anticoagulation Care Providers    Provider Role Specialty Phone number   [name], MD Responsible Internal Medicine [phone]     Allergies  Allergen Reactions  . Ciprofloxacin   . Bactrim Rash  . Codeine Nausea Only   Medication Sig  acetic acid-hydrocortisone (VOSOL-HC) otic solution Place 3 drops into the right ear 3 (three) times daily. For seven days.  amLODipine (NORVASC) 5 MG tablet Take 1 tablet (5 mg total) by mouth daily.  Ascorbic Acid (VITAMIN C PO) Take 1 capsule by mouth daily.  atenolol (TENORMIN) 25 MG tablet Take 1 tablet (25 mg total) by mouth 4 (four) times daily as needed. MAY TAKE 1 TABLET 4 TIMES DAILY AS NEEDED.  AZOPT 1 % ophthalmic suspension Place 1 drop into both eyes 2 (two) times daily.  budesonide-formoterol (SYMBICORT) 160-4.5 MCG/ACT inhaler Inhale 2 puffs into the lungs 2 (two) times daily.  Cholecalciferol (VITAMIN D PO) Take 1 capsule by mouth daily.  COMBIGAN 0.2-0.5 % ophthalmic solution Place 1 drop into both eyes 2 (two) times daily.  cyanocobalamin 1000 MCG tablet Take 1,000 mcg by mouth daily.  diltiazem (CARDIZEM CD) 120 MG 24 hr capsule TAKE ONE (1) CAPSULE BY MOUTH 2 TIMES  DAILY  DM-Doxylamine-Acetaminophen (NYQUIL COLD & FLU PO) Take 10 mLs by mouth daily as needed (mucus).  FLOVENT HFA 44 MCG/ACT inhaler INHALE 1 PUFF TWICE A DAY DAILY  furosemide (LASIX) 20 MG tablet Take 1 tablet (20 mg total) by mouth daily.  Homeopathic Products (ARNICARE ARNICA) CREA Apply 1 application topically daily.   HYDROcodone-acetaminophen (NORCO) 10-325 MG tablet Take 1 tablet by mouth every 6 (six) hours as needed for severe pain.  levalbuterol (XOPENEX) 0.63 MG/3ML nebulizer solution Take 1 ampule by nebulization every 8 (eight) hours as needed for wheezing.  magic mouthwash w/lidocaine SOLN Take 5 mLs by mouth 3 (three) times daily as needed for mouth pain.  meclizine (ANTIVERT) 25 MG tablet Take 1 tablet (25 mg total) by mouth 2 (two) times daily as needed for dizziness. TAKE ONE TABLET 2-3 TIMES A DAY AS NEEDED FOR VERTIGO  Multiple Vitamin (MULTIVITAMIN WITH MINERALS) TABS tablet Take 1 tablet by mouth daily.  NON FORMULARY Place 4 L into the nose at bedtime. 3 liter as needed during day depending on extertion   NON FORMULARY Apply 1 application topically daily as needed. [facility] Herbal Heal Ointment for damaged skin  NON FORMULARY Take 1-2 sprays by mouth daily as needed. Mouth Kote Dry Mouth Spray  Nystatin (NYAMYC) 100000 UNIT/GM POWD Apply topically.  OVER THE COUNTER MEDICATION "Calmer" sleep aid -  Take 1 tablet by mouth daily as needed for sleep.  predniSONE (DELTASONE) 10 MG tablet 4 tabs x 2 days, 3 tabs x 2 days, 2 tabs x 2 days, 1 tab x 2 days.  Respiratory Therapy Supplies (FLUTTER) DEVI Blow through 4 times per set, three sets daily  warfarin (COUMADIN) 2.5 MG tablet Take 1 tablet (2.5 mg total) by mouth daily.   Past Medical History:  Diagnosis Date  . Arthritis   . Cataract   . Chronic respiratory failure ([place])   . Endometriosis   . HTN (hypertension)   . Hx of echocardiogram    Echo (8/15):  Mild LVH, EF 65%, no RWMA, Ao sclerosis, no AS, mod  MAC, mild LAE, normal RVSF, PASP 40 mmHg  . Hx pulmonary embolism    on chronic coumadin  . Obesities, morbid ([place])   . OSA (obstructive sleep apnea)   . Osteoporosis   . PAF (paroxysmal atrial fibrillation) ([place])    Event Monitor (8/15):  Frequent PACs, brief bursts of nonsustained ATach; no AFib  . Psoriasis   . Skin cancer   . Vertigo    Social History   Social History  . Marital status: Widowed    Spouse name: N/A  . Number of children: N/A  . Years of education: N/A   Social History Main Topics  . Smoking status: Former Smoker    Packs/day: 1.00    Years: 2.00    Types: Cigarettes    Quit date: 04/03/1981  . Smokeless tobacco: Never Used  . Alcohol use No  . Drug use: No  . Sexual activity: Not Currently   Other Topics Concern  . Not on file   Social History Narrative   She lives with her sister in [place] and requires assistance at home. Has an aid who visits and helps at home often. Son works as a Catering manager and also visits and helps when he can. Semi-independent in ADLs, requires walker around home, dependent in IADLs.   Family History  Problem Relation Age of Onset  . Heart attack Father   . Heart attack Brother   . Stroke Neg Hx    ASSESSMENT Recent Results: Lab Results  Component Value Date   INR 2.0 01/09/2017   INR 2.6 01/01/2017   INR 2.1 12/26/2016   Anticoagulation Dosing: INR as of 01/09/2017 and Previous Warfarin Dosing Information    INR Dt INR Goal Wkly Tot Sun Mon Tue Wed Thu Fri Sat     2.0-3.0 17.5 mg 2.5 mg 2.5 mg 2.5 mg 2.5 mg 2.5 mg 2.5 mg 2.5 mg    Anticoagulation Warfarin Dose Instructions as of 01/09/2017      Total Sun Mon Tue Wed Thu Fri Sat   New Dose 17.5 mg 2.5 mg 2.5 mg 2.5 mg 2.5 mg 2.5 mg 2.5 mg 2.5 mg     (2.5 mg x 1)  (2.5 mg x 1)  (2.5 mg x 1)  (2.5 mg x 1)  (2.5 mg x 1)  (2.5 mg x 1)  (2.5 mg x 1)                           INR today: Therapeutic  PLAN Weekly dose was unchanged  Patient  Instructions  Patient educated about medication as defined in this encounter and verbalized understanding by repeating back instructions provided.   Patient advised to contact clinic or seek medical attention if signs/symptoms of bleeding  or thromboembolism occur.  Patient verbalized understanding by repeating back information and was advised to contact me if further medication-related questions arise. Patient was also provided an information handout.  Follow-up Return in about 1 week (around 01/16/2017).  [name]

## 2017-01-09 NOTE — Telephone Encounter (Signed)
lmtcb X1 for pt.  Will call in pred taper after verifying pharmacy.

## 2017-01-10 ENCOUNTER — Telehealth: Payer: Self-pay | Admitting: Internal Medicine

## 2017-01-10 DIAGNOSIS — J9611 Chronic respiratory failure with hypoxia: Secondary | ICD-10-CM

## 2017-01-10 MED ORDER — LEVALBUTEROL HCL 0.63 MG/3ML IN NEBU: 0.6300 mg | INHALATION_SOLUTION | Freq: Three times a day (TID) | RESPIRATORY_TRACT | 6 refills | Status: AC | PRN

## 2017-01-10 NOTE — Telephone Encounter (Signed)
Called patient to discuss her respiratory complaints and fatigue. She saw pulmonology and had a CT chest.  She was placed on prednisone.  She is needing more oxygen.  Her fatigue is likely related to her chronic respiratory failure and poor sleep hygiene.  Her lab results from our recent visit were normal.    She is requesting a nebulizer machine and levalbuterol for her breathing which I will prescribe today.  She has follow up with pulmonology in 1 month.   I advised her to call with any questions or concerns.

## 2017-01-10 NOTE — Telephone Encounter (Signed)
Spoke with Ms. Henkels and she is doing better.  Will follow up soon with me and Dr. Annamaria Boots.  She needs a nebulizer which was ordered today.

## 2017-01-10 NOTE — Progress Notes (Signed)
I reviewed Dr. Julianne Rice note and agree with plan.

## 2017-01-11 LAB — PROTIME-INR

## 2017-01-12 ENCOUNTER — Other Ambulatory Visit: Payer: Self-pay | Admitting: Internal Medicine

## 2017-01-12 DIAGNOSIS — I10 Essential (primary) hypertension: Secondary | ICD-10-CM

## 2017-01-12 DIAGNOSIS — R0602 Shortness of breath: Secondary | ICD-10-CM

## 2017-01-15 ENCOUNTER — Encounter: Payer: Self-pay | Admitting: Pharmacist

## 2017-01-15 ENCOUNTER — Telehealth: Payer: Self-pay | Admitting: Internal Medicine

## 2017-01-15 DIAGNOSIS — Z86718 Personal history of other venous thrombosis and embolism: Secondary | ICD-10-CM

## 2017-01-15 DIAGNOSIS — Z7901 Long term (current) use of anticoagulants: Secondary | ICD-10-CM

## 2017-01-15 DIAGNOSIS — Z86711 Personal history of pulmonary embolism: Secondary | ICD-10-CM

## 2017-01-15 LAB — POCT INR: INR: 2.3

## 2017-01-15 NOTE — Progress Notes (Signed)
Anticoagulation Management Jacqueline Nguyen a 83 y.o.femalewho reports to the clinic for monitoring of warfarintreatment.   Indication: DVT and PEhistory, paroxysmal atrial fibrillation Duration: indefinite Supervising physician: Gilles Chiquito  Patient reports no signs or symptoms of bleeding or thromboembolism.  Anticoagulation Episode Summary    Current INR goal:   2.0-3.0  TTR:   77.5 % (5.7 mo)  Next INR check:   01/22/2017  INR from last check:   2.3 (01/15/2017)  Weekly max warfarin dose:     Target end date:     INR check location:     Preferred lab:     Send INR reminders to:      Indications   History of DVT (deep vein thrombosis) [Z86.718] Anticoagulated on Coumadin [Z51.81 Z79.01] History of pulmonary embolism [Z86.711]       Comments:         Anticoagulation Care Providers    Provider Role Specialty Phone number   [name], MD Responsible Internal Medicine [phone]     Allergies  Allergen Reactions  . Ciprofloxacin   . Bactrim Rash  . Codeine Nausea Only   Medication Sig  acetic acid-hydrocortisone (VOSOL-HC) otic solution Place 3 drops into the right ear 3 (three) times daily. For seven days.  amLODipine (NORVASC) 5 MG tablet Take 1 tablet (5 mg total) by mouth daily.  Ascorbic Acid (VITAMIN C PO) Take 1 capsule by mouth daily.  atenolol (TENORMIN) 25 MG tablet Take 1 tablet (25 mg total) by mouth 4 (four) times daily as needed. MAY TAKE 1 TABLET 4 TIMES DAILY AS NEEDED.  AZOPT 1 % ophthalmic suspension Place 1 drop into both eyes 2 (two) times daily.  budesonide-formoterol (SYMBICORT) 160-4.5 MCG/ACT inhaler Inhale 2 puffs into the lungs 2 (two) times daily.  Cholecalciferol (VITAMIN D PO) Take 1 capsule by mouth daily.  COMBIGAN 0.2-0.5 % ophthalmic solution Place 1 drop into both eyes 2 (two) times daily.  cyanocobalamin 1000 MCG tablet Take 1,000 mcg by mouth daily.  diltiazem (CARDIZEM CD) 120 MG 24 hr capsule TAKE ONE (1) CAPSULE BY  MOUTH 2 TIMES DAILY  DM-Doxylamine-Acetaminophen (NYQUIL COLD & FLU PO) Take 10 mLs by mouth daily as needed (mucus).  FLOVENT HFA 44 MCG/ACT inhaler INHALE 1 PUFF TWICE A DAY DAILY  furosemide (LASIX) 20 MG tablet TAKE 1 TABLET (20 MG TOTAL) BY MOUTH DAILY.  Homeopathic Products (ARNICARE ARNICA) CREA Apply 1 application topically daily.   HYDROcodone-acetaminophen (NORCO) 10-325 MG tablet Take 1 tablet by mouth every 6 (six) hours as needed for severe pain.  levalbuterol (XOPENEX) 0.63 MG/3ML nebulizer solution Take 3 mLs (0.63 mg total) by nebulization every 8 (eight) hours as needed for wheezing.  magic mouthwash w/lidocaine SOLN Take 5 mLs by mouth 3 (three) times daily as needed for mouth pain.  meclizine (ANTIVERT) 25 MG tablet Take 1 tablet (25 mg total) by mouth 2 (two) times daily as needed for dizziness. TAKE ONE TABLET 2-3 TIMES A DAY AS NEEDED FOR VERTIGO  Multiple Vitamin (MULTIVITAMIN WITH MINERALS) TABS tablet Take 1 tablet by mouth daily.  NON FORMULARY Place 4 L into the nose at bedtime. 3 liter as needed during day depending on extertion   NON FORMULARY Apply 1 application topically daily as needed. [facility] Herbal Heal Ointment for damaged skin  NON FORMULARY Take 1-2 sprays by mouth daily as needed. Mouth Kote Dry Mouth Spray  Nystatin (NYAMYC) 100000 UNIT/GM POWD Apply topically.  OVER THE COUNTER MEDICATION "Calmer" sleep aid -  Take 1 tablet by mouth daily as needed for sleep.  predniSONE (DELTASONE) 10 MG tablet 4 tabs x 2 days, 3 tabs x 2 days, 2 tabs x 2 days, 1 tab x 2 days.  Respiratory Therapy Supplies (FLUTTER) DEVI Blow through 4 times per set, three sets daily  warfarin (COUMADIN) 2.5 MG tablet Take 1 tablet (2.5 mg total) by mouth daily.   Past Medical History:  Diagnosis Date  . Arthritis   . Cataract   . Chronic respiratory failure ([place])   . Endometriosis   . HTN (hypertension)   . Hx of echocardiogram    Echo (8/15):  Mild LVH, EF 65%, no  RWMA, Ao sclerosis, no AS, mod MAC, mild LAE, normal RVSF, PASP 40 mmHg  . Hx pulmonary embolism    on chronic coumadin  . Obesities, morbid ([place])   . OSA (obstructive sleep apnea)   . Osteoporosis   . PAF (paroxysmal atrial fibrillation) ([place])    Event Monitor (8/15):  Frequent PACs, brief bursts of nonsustained ATach; no AFib  . Psoriasis   . Skin cancer   . Vertigo    Social History   Social History  . Marital status: Widowed    Spouse name: N/A  . Number of children: N/A  . Years of education: N/A   Social History Main Topics  . Smoking status: Former Smoker    Packs/day: 1.00    Years: 2.00    Types: Cigarettes    Quit date: 04/03/1981  . Smokeless tobacco: Never Used  . Alcohol use No  . Drug use: No  . Sexual activity: Not Currently   Other Topics Concern  . Not on file   Social History Narrative   She lives with her sister in [place] and requires assistance at home. Has an aid who visits and helps at home often. Son works as a Catering manager and also visits and helps when he can. Semi-independent in ADLs, requires walker around home, dependent in IADLs.   Family History  Problem Relation Age of Onset  . Heart attack Father   . Heart attack Brother   . Stroke Neg Hx    ASSESSMENT Recent Results: The most recent result is correlated with 17.5 mg per week: Lab Results  Component Value Date   INR 2.3 01/15/2017   INR 2.0 01/09/2017   INR 2.6 01/01/2017   Anticoagulation Dosing: INR as of 01/15/2017 and Previous Warfarin Dosing Information    INR Dt INR Goal [employer] Sun Mon Tue Wed Thu Fri Sat   01/15/2017 2.3 2.0-3.0 17.5 mg 2.5 mg 2.5 mg 2.5 mg 2.5 mg 2.5 mg 2.5 mg 2.5 mg    Anticoagulation Warfarin Dose Instructions as of 01/15/2017      Total Sun Mon Tue Wed Thu Fri Sat   New Dose 17.5 mg 2.5 mg 2.5 mg 2.5 mg 2.5 mg 2.5 mg 2.5 mg 2.5 mg     (2.5 mg x 1)  (2.5 mg x 1)  (2.5 mg x 1)  (2.5 mg x 1)  (2.5 mg x 1)  (2.5 mg x 1)  (2.5 mg x 1)                            INR today: Therapeutic  PLAN Weekly dose was unchanged  Patient Instructions  Patient educated about medication as defined in this encounter and verbalized understanding by repeating back instructions provided.   Patient advised  to contact clinic or seek medical attention if signs/symptoms of bleeding or thromboembolism occur.  Patient verbalized understanding by repeating back information and was advised to contact me if further medication-related questions arise. Patient was also provided an information handout.  Follow-up Return in about 1 week (around 01/22/2017).  [name]

## 2017-01-15 NOTE — Telephone Encounter (Signed)
Spoke with Bonnita Nasuti. She stated that the patient was interested in getting setup with a nebulizer in order to her breathing. Per Bonnita Nasuti, the patient stated that she spoke with someone regarding the nebulizer but there is no documentation in the chart of that phone call.   Polly Cobia that the patient hasn't been seen by CY in over a year.

## 2017-01-15 NOTE — Telephone Encounter (Signed)
Will close this message since it a duplicate of another message placed today.

## 2017-01-15 NOTE — Patient Instructions (Signed)
Patient educated about medication as defined in this encounter and verbalized understanding by repeating back instructions provided.

## 2017-01-15 NOTE — Telephone Encounter (Signed)
Pt calls and states she wants to get a nebulizer and meds for it, she feels it would help her. Also she states her HR has been lower than normal today Called dr Janee Morn office to speak w/ his nurse r/t nebulizer, informed pt Got cut off while speaking w/ pt, tried to call back, lm for rtc

## 2017-01-18 NOTE — Progress Notes (Signed)
I reviewed Dr. Julianne Rice note.  INR at goal.

## 2017-01-22 ENCOUNTER — Encounter: Payer: Self-pay | Admitting: Pharmacist

## 2017-01-22 DIAGNOSIS — J449 Chronic obstructive pulmonary disease, unspecified: Secondary | ICD-10-CM

## 2017-01-22 DIAGNOSIS — Z86711 Personal history of pulmonary embolism: Secondary | ICD-10-CM

## 2017-01-22 DIAGNOSIS — Z86718 Personal history of other venous thrombosis and embolism: Secondary | ICD-10-CM

## 2017-01-22 DIAGNOSIS — Z7901 Long term (current) use of anticoagulants: Secondary | ICD-10-CM

## 2017-01-22 LAB — PROTIME-INR

## 2017-01-22 LAB — POCT INR: INR: 2.6

## 2017-01-22 MED ORDER — BUDESONIDE-FORMOTEROL FUMARATE 160-4.5 MCG/ACT IN AERO: 2.0000 | INHALATION_SPRAY | Freq: Two times a day (BID) | RESPIRATORY_TRACT | 3 refills | Status: DC

## 2017-01-22 NOTE — Progress Notes (Signed)
Anticoagulation Management Jacqueline Nguyen a 83 y.o.femalewho reports to the clinic for monitoring of warfarintreatment.   Indication: DVT and PEhistory, paroxysmal atrial fibrillation Duration: indefinite Supervising physician: Gilles Chiquito  Patient reports no signs or symptoms of bleeding or thromboembolism.  Anticoagulation Episode Summary    Current INR goal:   2.0-3.0  TTR:   78.4 % (5.9 mo)  Next INR check:   01/29/2017  INR from last check:   2.6 (01/22/2017)  Weekly max warfarin dose:     Target end date:     INR check location:     Preferred lab:     Send INR reminders to:      Indications   History of DVT (deep vein thrombosis) [Z86.718] Anticoagulated on Coumadin [Z51.81 Z79.01] History of pulmonary embolism [Z86.711]       Comments:         Anticoagulation Care Providers    Provider Role Specialty Phone number   [name], MD Responsible Internal Medicine [phone]     Allergies  Allergen Reactions  . Ciprofloxacin   . Bactrim Rash  . Codeine Nausea Only   Medication Sig  acetic acid-hydrocortisone (VOSOL-HC) otic solution Place 3 drops into the right ear 3 (three) times daily. For seven days.  amLODipine (NORVASC) 5 MG tablet Take 1 tablet (5 mg total) by mouth daily.  Ascorbic Acid (VITAMIN C PO) Take 1 capsule by mouth daily.  atenolol (TENORMIN) 25 MG tablet Take 1 tablet (25 mg total) by mouth 4 (four) times daily as needed. MAY TAKE 1 TABLET 4 TIMES DAILY AS NEEDED.  AZOPT 1 % ophthalmic suspension Place 1 drop into both eyes 2 (two) times daily.  budesonide-formoterol (SYMBICORT) 160-4.5 MCG/ACT inhaler Inhale 2 puffs into the lungs 2 (two) times daily. Rinse mouth after each use  Cholecalciferol (VITAMIN D PO) Take 1 capsule by mouth daily.  COMBIGAN 0.2-0.5 % ophthalmic solution Place 1 drop into both eyes 2 (two) times daily.  cyanocobalamin 1000 MCG tablet Take 1,000 mcg by mouth daily.  diltiazem (CARDIZEM CD) 120 MG 24 hr  capsule TAKE ONE (1) CAPSULE BY MOUTH 2 TIMES DAILY  DM-Doxylamine-Acetaminophen (NYQUIL COLD & FLU PO) Take 10 mLs by mouth daily as needed (mucus).  FLOVENT HFA 44 MCG/ACT inhaler INHALE 1 PUFF TWICE A DAY DAILY  furosemide (LASIX) 20 MG tablet TAKE 1 TABLET (20 MG TOTAL) BY MOUTH DAILY.  Homeopathic Products (ARNICARE ARNICA) CREA Apply 1 application topically daily.   HYDROcodone-acetaminophen (NORCO) 10-325 MG tablet Take 1 tablet by mouth every 6 (six) hours as needed for severe pain.  levalbuterol (XOPENEX) 0.63 MG/3ML nebulizer solution Take 3 mLs (0.63 mg total) by nebulization every 8 (eight) hours as needed for wheezing.  magic mouthwash w/lidocaine SOLN Take 5 mLs by mouth 3 (three) times daily as needed for mouth pain.  meclizine (ANTIVERT) 25 MG tablet Take 1 tablet (25 mg total) by mouth 2 (two) times daily as needed for dizziness. TAKE ONE TABLET 2-3 TIMES A DAY AS NEEDED FOR VERTIGO  Multiple Vitamin (MULTIVITAMIN WITH MINERALS) TABS tablet Take 1 tablet by mouth daily.  NON FORMULARY Place 4 L into the nose at bedtime. 3 liter as needed during day depending on extertion   NON FORMULARY Apply 1 application topically daily as needed. [facility] Herbal Heal Ointment for damaged skin  NON FORMULARY Take 1-2 sprays by mouth daily as needed. Mouth Kote Dry Mouth Spray  Nystatin (NYAMYC) 100000 UNIT/GM POWD Apply topically.  OVER THE COUNTER  MEDICATION "Calmer" sleep aid - Take 1 tablet by mouth daily as needed for sleep.  predniSONE (DELTASONE) 10 MG tablet 4 tabs x 2 days, 3 tabs x 2 days, 2 tabs x 2 days, 1 tab x 2 days.  Respiratory Therapy Supplies (FLUTTER) DEVI Blow through 4 times per set, three sets daily  warfarin (COUMADIN) 2.5 MG tablet Take 1 tablet (2.5 mg total) by mouth daily.   Past Medical History:  Diagnosis Date  . Arthritis   . Cataract   . Chronic respiratory failure ([place])   . Endometriosis   . HTN (hypertension)   . Hx of echocardiogram    Echo  (8/15):  Mild LVH, EF 65%, no RWMA, Ao sclerosis, no AS, mod MAC, mild LAE, normal RVSF, PASP 40 mmHg  . Hx pulmonary embolism    on chronic coumadin  . Obesities, morbid ([place])   . OSA (obstructive sleep apnea)   . Osteoporosis   . PAF (paroxysmal atrial fibrillation) ([place])    Event Monitor (8/15):  Frequent PACs, brief bursts of nonsustained ATach; no AFib  . Psoriasis   . Skin cancer   . Vertigo    Social History   Social History  . Marital status: Widowed    Spouse name: N/A  . Number of children: N/A  . Years of education: N/A   Social History Main Topics  . Smoking status: Former Smoker    Packs/day: 1.00    Years: 2.00    Types: Cigarettes    Quit date: 04/03/1981  . Smokeless tobacco: Never Used  . Alcohol use No  . Drug use: No  . Sexual activity: Not Currently   Other Topics Concern  . Not on file   Social History Narrative   She lives with her sister in [place] and requires assistance at home. Has an aid who visits and helps at home often. Son works as a Catering manager and also visits and helps when he can. Semi-independent in ADLs, requires walker around home, dependent in IADLs.   Family History  Problem Relation Age of Onset  . Heart attack Father   . Heart attack Brother   . Stroke Neg Hx    ASSESSMENT Recent Results: The most recent result is correlated with 17.5 mg per week: Lab Results  Component Value Date   INR 2.6 01/22/2017   INR 2.3 01/15/2017   INR 2.0 01/09/2017   Anticoagulation Dosing: INR as of 01/22/2017 and Previous Warfarin Dosing Information    INR Dt INR Goal [employer] Sun Mon Tue Wed Thu Fri Sat   01/22/2017 2.6 2.0-3.0 17.5 mg 2.5 mg 2.5 mg 2.5 mg 2.5 mg 2.5 mg 2.5 mg 2.5 mg    Anticoagulation Warfarin Dose Instructions as of 01/22/2017      Total Sun Mon Tue Wed Thu Fri Sat   New Dose 17.5 mg 2.5 mg 2.5 mg 2.5 mg 2.5 mg 2.5 mg 2.5 mg 2.5 mg     (2.5 mg x 1)  (2.5 mg x 1)  (2.5 mg x 1)  (2.5 mg x 1)  (2.5 mg x 1)   (2.5 mg x 1)  (2.5 mg x 1)                           INR today: Therapeutic  PLAN Weekly dose was unchanged  Patient Instructions  Patient educated about medication as defined in this encounter and verbalized understanding by repeating back instructions  provided.   Patient advised to contact clinic or seek medical attention if signs/symptoms of bleeding or thromboembolism occur.  Patient verbalized understanding by repeating back information and was advised to contact me if further medication-related questions arise. Patient was also provided an information handout.  Follow-up Return in about 1 week (around 01/29/2017).  [name]

## 2017-01-22 NOTE — Patient Instructions (Signed)
Patient educated about medication as defined in this encounter and verbalized understanding by repeating back instructions provided.

## 2017-01-23 NOTE — Progress Notes (Signed)
I have reviewed Dr. Julianne Rice note.  INR at goal and no change to coumadin suggested.

## 2017-01-25 ENCOUNTER — Telehealth: Payer: Self-pay

## 2017-01-25 NOTE — Telephone Encounter (Signed)
Lm for rtc 

## 2017-01-25 NOTE — Telephone Encounter (Signed)
Would like Jacqueline Nguyen to call back.

## 2017-01-26 ENCOUNTER — Telehealth: Payer: Self-pay | Admitting: Cardiovascular Disease

## 2017-01-26 LAB — PROTIME-INR

## 2017-01-26 NOTE — Telephone Encounter (Signed)
Lm for rtc 

## 2017-01-26 NOTE — Telephone Encounter (Signed)
Spoke to son.  Son was inquiring if Dr Renato Battles- (oral surgeon in [place]) ,had contacted Dr C.concerning patient.  Patient had few teeth fall out , with out pin , and dry mouth.  Per son ,the oral surgeon wanted to know how the patient favor with any type procedures " Possible treat extraction or just treat situation as emergency if she has any pain associated with her mouth.   Address: DR Renato Battles [address]   [place] [phone]  RN inform son that there may have been a letter or note faxed for DR C. To review. SON IS AWARE WILL DEFER TO DR CROITORU and CMA TO CONTACT BACK.

## 2017-01-26 NOTE — Telephone Encounter (Signed)
Does Dr C recommend  That the pt only see the dentist only under emergency situation? Son wants to know if pt have can have a  complete mouth restoration?

## 2017-01-29 ENCOUNTER — Encounter: Payer: Self-pay | Admitting: Pharmacist

## 2017-01-29 ENCOUNTER — Telehealth: Payer: Self-pay | Admitting: Internal Medicine

## 2017-01-29 DIAGNOSIS — R5381 Other malaise: Secondary | ICD-10-CM

## 2017-01-29 DIAGNOSIS — Z86718 Personal history of other venous thrombosis and embolism: Secondary | ICD-10-CM

## 2017-01-29 DIAGNOSIS — Z86711 Personal history of pulmonary embolism: Secondary | ICD-10-CM

## 2017-01-29 DIAGNOSIS — Z7901 Long term (current) use of anticoagulants: Secondary | ICD-10-CM

## 2017-01-29 DIAGNOSIS — R5383 Other fatigue: Secondary | ICD-10-CM

## 2017-01-29 LAB — POCT INR: INR: 1.9

## 2017-01-29 NOTE — Telephone Encounter (Signed)
CALLED Jacqueline Nguyen - NO ANSWER - UNABLE TO LEAVE MESSAGE  VOICEMAIL FULL

## 2017-01-29 NOTE — Telephone Encounter (Signed)
PATIENT WOULD LIKE FOR HELEN TO GIVE HER A CALL AT [phone]

## 2017-01-29 NOTE — Telephone Encounter (Signed)
Requesting Helen to call back.  

## 2017-01-29 NOTE — Telephone Encounter (Signed)
Pt calls and states she is starting to have a breakdown area on her buttocks, she states she called her insurance company about a HHN , she was told that it would be approved, also she needs a pressure cushion for her chair  Please advise

## 2017-01-29 NOTE — Telephone Encounter (Signed)
From a Cardiology point of view, no significant restrictions for oral surgery. Caution with sedatives due to respiratory problems. MCr

## 2017-01-30 NOTE — Telephone Encounter (Signed)
INFORMATION GIVEN TO PATIENT. WILL LET SON KNOW , ANY QUESTION CAN CALL BACK

## 2017-01-30 NOTE — Telephone Encounter (Signed)
Have called

## 2017-01-30 NOTE — Telephone Encounter (Signed)
I received a call yesterday about this as well.  I will order [facility] nursing for wound care and a pressure cushion for chair.  THanks.

## 2017-02-01 LAB — PROTIME-INR

## 2017-02-01 NOTE — Progress Notes (Signed)
Anticoagulation Management Jacqueline Nguyen a 83 y.o.femalewho reports to the clinic for monitoring of warfarintreatment.   Indication: DVT and PEhistory, paroxysmal atrial fibrillation Duration: indefinite Supervising physician: Gilles Chiquito  Patient reports no signs or symptoms of bleeding or thromboembolism.  Anticoagulation Episode Summary    Current INR goal:   2.0-3.0  TTR:   78.7 % (6.1 mo)  Next INR check:   02/05/2017  INR from last check:   1.9! (01/29/2017)  Weekly max warfarin dose:     Target end date:     INR check location:     Preferred lab:     Send INR reminders to:      Indications   History of DVT (deep vein thrombosis) [Z86.718] Anticoagulated on Coumadin [Z51.81 Z79.01] History of pulmonary embolism [Z86.711]       Comments:         Anticoagulation Care Providers    Provider Role Specialty Phone number   [name], MD Responsible Internal Medicine [phone]     Allergies  Allergen Reactions  . Ciprofloxacin   . Bactrim Rash  . Codeine Nausea Only   Medication Sig  acetic acid-hydrocortisone (VOSOL-HC) otic solution Place 3 drops into the right ear 3 (three) times daily. For seven days.  amLODipine (NORVASC) 5 MG tablet Take 1 tablet (5 mg total) by mouth daily.  Ascorbic Acid (VITAMIN C PO) Take 1 capsule by mouth daily.  atenolol (TENORMIN) 25 MG tablet Take 1 tablet (25 mg total) by mouth 4 (four) times daily as needed. MAY TAKE 1 TABLET 4 TIMES DAILY AS NEEDED.  AZOPT 1 % ophthalmic suspension Place 1 drop into both eyes 2 (two) times daily.  budesonide-formoterol (SYMBICORT) 160-4.5 MCG/ACT inhaler Inhale 2 puffs into the lungs 2 (two) times daily. Rinse mouth after each use  Cholecalciferol (VITAMIN D PO) Take 1 capsule by mouth daily.  COMBIGAN 0.2-0.5 % ophthalmic solution Place 1 drop into both eyes 2 (two) times daily.  cyanocobalamin 1000 MCG tablet Take 1,000 mcg by mouth daily.  diltiazem (CARDIZEM CD) 120 MG 24 hr  capsule TAKE ONE (1) CAPSULE BY MOUTH 2 TIMES DAILY  DM-Doxylamine-Acetaminophen (NYQUIL COLD & FLU PO) Take 10 mLs by mouth daily as needed (mucus).  FLOVENT HFA 44 MCG/ACT inhaler INHALE 1 PUFF TWICE A DAY DAILY  furosemide (LASIX) 20 MG tablet TAKE 1 TABLET (20 MG TOTAL) BY MOUTH DAILY.  Homeopathic Products (ARNICARE ARNICA) CREA Apply 1 application topically daily.   HYDROcodone-acetaminophen (NORCO) 10-325 MG tablet Take 1 tablet by mouth every 6 (six) hours as needed for severe pain.  levalbuterol (XOPENEX) 0.63 MG/3ML nebulizer solution Take 3 mLs (0.63 mg total) by nebulization every 8 (eight) hours as needed for wheezing.  magic mouthwash w/lidocaine SOLN Take 5 mLs by mouth 3 (three) times daily as needed for mouth pain.  meclizine (ANTIVERT) 25 MG tablet Take 1 tablet (25 mg total) by mouth 2 (two) times daily as needed for dizziness. TAKE ONE TABLET 2-3 TIMES A DAY AS NEEDED FOR VERTIGO  Multiple Vitamin (MULTIVITAMIN WITH MINERALS) TABS tablet Take 1 tablet by mouth daily.  NON FORMULARY Place 4 L into the nose at bedtime. 3 liter as needed during day depending on extertion   NON FORMULARY Apply 1 application topically daily as needed. [facility] Herbal Heal Ointment for damaged skin  NON FORMULARY Take 1-2 sprays by mouth daily as needed. Mouth Kote Dry Mouth Spray  Nystatin (NYAMYC) 100000 UNIT/GM POWD Apply topically.  OVER THE COUNTER  MEDICATION "Calmer" sleep aid - Take 1 tablet by mouth daily as needed for sleep.  predniSONE (DELTASONE) 10 MG tablet 4 tabs x 2 days, 3 tabs x 2 days, 2 tabs x 2 days, 1 tab x 2 days.  Respiratory Therapy Supplies (FLUTTER) DEVI Blow through 4 times per set, three sets daily  warfarin (COUMADIN) 2.5 MG tablet Take 1 tablet (2.5 mg total) by mouth daily.   Past Medical History:  Diagnosis Date  . Arthritis   . Cataract   . Chronic respiratory failure ([place])   . Endometriosis   . HTN (hypertension)   . Hx of echocardiogram    Echo  (8/15):  Mild LVH, EF 65%, no RWMA, Ao sclerosis, no AS, mod MAC, mild LAE, normal RVSF, PASP 40 mmHg  . Hx pulmonary embolism    on chronic coumadin  . Obesities, morbid ([place])   . OSA (obstructive sleep apnea)   . Osteoporosis   . PAF (paroxysmal atrial fibrillation) ([place])    Event Monitor (8/15):  Frequent PACs, brief bursts of nonsustained ATach; no AFib  . Psoriasis   . Skin cancer   . Vertigo    Social History   Social History  . Marital status: Widowed    Spouse name: N/A  . Number of children: N/A  . Years of education: N/A   Social History Main Topics  . Smoking status: Former Smoker    Packs/day: 1.00    Years: 2.00    Types: Cigarettes    Quit date: 04/03/1981  . Smokeless tobacco: Never Used  . Alcohol use No  . Drug use: No  . Sexual activity: Not Currently   Other Topics Concern  . Not on file   Social History Narrative   She lives with her sister in [place] and requires assistance at home. Has an aid who visits and helps at home often. Son works as a Catering manager and also visits and helps when he can. Semi-independent in ADLs, requires walker around home, dependent in IADLs.   Family History  Problem Relation Age of Onset  . Heart attack Father   . Heart attack Brother   . Stroke Neg Hx    ASSESSMENT Recent Results: Lab Results  Component Value Date   INR 1.9 01/29/2017   INR 2.6 01/22/2017   INR 2.3 01/15/2017   Anticoagulation Dosing: INR as of 01/29/2017 and Previous Warfarin Dosing Information    INR Dt INR Goal [employer] Sun Mon Tue Wed Thu Fri Sat   01/29/2017 1.9 2.0-3.0 17.5 mg 2.5 mg 2.5 mg 2.5 mg 2.5 mg 2.5 mg 2.5 mg 2.5 mg    Anticoagulation Warfarin Dose Instructions as of 01/29/2017      Total Sun Mon Tue Wed Thu Fri Sat   New Dose 17.5 mg 2.5 mg 2.5 mg 2.5 mg 2.5 mg 2.5 mg 2.5 mg 2.5 mg     (2.5 mg x 1)  (2.5 mg x 1)  (2.5 mg x 1)  (2.5 mg x 1)  (2.5 mg x 1)  (2.5 mg x 1)  (2.5 mg x 1)                           INR  today: slightly Subtherapeutic  PLAN Weekly dose was unchanged   Patient Instructions  Patient educated about medication as defined in this encounter and verbalized understanding by repeating back instructions provided.   Patient advised to contact clinic or  seek medical attention if signs/symptoms of bleeding or thromboembolism occur.  Patient verbalized understanding by repeating back information and was advised to contact me if further medication-related questions arise. Patient was also provided an information handout.  Follow-up Return in about 7 days (around 02/05/2017).  [name]

## 2017-02-01 NOTE — Telephone Encounter (Signed)
Spoke w/ pt and son, they would prefer appt 11/14, she feels she can wait, encouraged if she changes her mind to call for appt [facility] before 11/14

## 2017-02-01 NOTE — Patient Instructions (Signed)
Patient educated about medication as defined in this encounter and verbalized understanding by repeating back instructions provided.

## 2017-02-01 NOTE — Progress Notes (Signed)
I reviewed Dr. Julianne Rice note and agree with plan.

## 2017-02-01 NOTE — Telephone Encounter (Signed)
Orders placed.

## 2017-02-01 NOTE — Telephone Encounter (Signed)
Have called pt twice today, no answer, pt related in last conversation that vmail does not work on her ph, will continue to call to schedule appt

## 2017-02-05 LAB — POCT INR: INR: 2.2

## 2017-02-06 ENCOUNTER — Encounter: Payer: Self-pay | Admitting: Pharmacist

## 2017-02-06 ENCOUNTER — Telehealth: Payer: Self-pay

## 2017-02-06 DIAGNOSIS — Z86718 Personal history of other venous thrombosis and embolism: Secondary | ICD-10-CM

## 2017-02-06 DIAGNOSIS — Z7901 Long term (current) use of anticoagulants: Secondary | ICD-10-CM

## 2017-02-06 DIAGNOSIS — Z86711 Personal history of pulmonary embolism: Secondary | ICD-10-CM

## 2017-02-06 NOTE — Patient Instructions (Signed)
Patient educated about medication as defined in this encounter and verbalized understanding by repeating back instructions provided.

## 2017-02-06 NOTE — Progress Notes (Signed)
Anticoagulation Management Jacqueline Nguyen a 83 y.o.femalewho reports to the clinic for monitoring of warfarintreatment.   Indication: DVT and PEhistory, paroxysmal atrial fibrillation Duration: indefinite Supervising physician: Gilles Chiquito  Patient reports no signs or symptoms of bleeding or thromboembolism.  Anticoagulation Episode Summary    Current INR goal:   2.0-3.0  TTR:   78.3 % (6.4 mo)  Next INR check:   02/12/2017  INR from last check:     Weekly max warfarin dose:     Target end date:     INR check location:     Preferred lab:     Send INR reminders to:      Indications   History of DVT (deep vein thrombosis) [Z86.718] Anticoagulated on Coumadin [Z51.81 Z79.01] History of pulmonary embolism [Z86.711]       Comments:         Anticoagulation Care Providers    Provider Role Specialty Phone number   [name], MD Responsible Internal Medicine [phone]     Allergies  Allergen Reactions  . Ciprofloxacin   . Bactrim Rash  . Codeine Nausea Only   Medication Sig  acetic acid-hydrocortisone (VOSOL-HC) otic solution Place 3 drops into the right ear 3 (three) times daily. For seven days.  amLODipine (NORVASC) 5 MG tablet Take 1 tablet (5 mg total) by mouth daily.  Ascorbic Acid (VITAMIN C PO) Take 1 capsule by mouth daily.  atenolol (TENORMIN) 25 MG tablet Take 1 tablet (25 mg total) by mouth 4 (four) times daily as needed. MAY TAKE 1 TABLET 4 TIMES DAILY AS NEEDED.  AZOPT 1 % ophthalmic suspension Place 1 drop into both eyes 2 (two) times daily.  budesonide-formoterol (SYMBICORT) 160-4.5 MCG/ACT inhaler Inhale 2 puffs into the lungs 2 (two) times daily. Rinse mouth after each use  Cholecalciferol (VITAMIN D PO) Take 1 capsule by mouth daily.  COMBIGAN 0.2-0.5 % ophthalmic solution Place 1 drop into both eyes 2 (two) times daily.  cyanocobalamin 1000 MCG tablet Take 1,000 mcg by mouth daily.  diltiazem (CARDIZEM CD) 120 MG 24 hr capsule TAKE ONE (1)  CAPSULE BY MOUTH 2 TIMES DAILY  DM-Doxylamine-Acetaminophen (NYQUIL COLD & FLU PO) Take 10 mLs by mouth daily as needed (mucus).  FLOVENT HFA 44 MCG/ACT inhaler INHALE 1 PUFF TWICE A DAY DAILY  furosemide (LASIX) 20 MG tablet TAKE 1 TABLET (20 MG TOTAL) BY MOUTH DAILY.  Homeopathic Products (ARNICARE ARNICA) CREA Apply 1 application topically daily.   HYDROcodone-acetaminophen (NORCO) 10-325 MG tablet Take 1 tablet by mouth every 6 (six) hours as needed for severe pain.  levalbuterol (XOPENEX) 0.63 MG/3ML nebulizer solution Take 3 mLs (0.63 mg total) by nebulization every 8 (eight) hours as needed for wheezing.  magic mouthwash w/lidocaine SOLN Take 5 mLs by mouth 3 (three) times daily as needed for mouth pain.  meclizine (ANTIVERT) 25 MG tablet Take 1 tablet (25 mg total) by mouth 2 (two) times daily as needed for dizziness. TAKE ONE TABLET 2-3 TIMES A DAY AS NEEDED FOR VERTIGO  Multiple Vitamin (MULTIVITAMIN WITH MINERALS) TABS tablet Take 1 tablet by mouth daily.  NON FORMULARY Place 4 L into the nose at bedtime. 3 liter as needed during day depending on extertion   NON FORMULARY Apply 1 application topically daily as needed. [facility] Herbal Heal Ointment for damaged skin  NON FORMULARY Take 1-2 sprays by mouth daily as needed. Mouth Kote Dry Mouth Spray  Nystatin (NYAMYC) 100000 UNIT/GM POWD Apply topically.  OVER THE COUNTER MEDICATION "  Calmer" sleep aid - Take 1 tablet by mouth daily as needed for sleep.  predniSONE (DELTASONE) 10 MG tablet 4 tabs x 2 days, 3 tabs x 2 days, 2 tabs x 2 days, 1 tab x 2 days.  Respiratory Therapy Supplies (FLUTTER) DEVI Blow through 4 times per set, three sets daily  warfarin (COUMADIN) 2.5 MG tablet Take 1 tablet (2.5 mg total) by mouth daily.   Past Medical History:  Diagnosis Date  . Arthritis   . Cataract   . Chronic respiratory failure ([place])   . Endometriosis   . HTN (hypertension)   . Hx of echocardiogram    Echo (8/15):  Mild LVH, EF  65%, no RWMA, Ao sclerosis, no AS, mod MAC, mild LAE, normal RVSF, PASP 40 mmHg  . Hx pulmonary embolism    on chronic coumadin  . Obesities, morbid ([place])   . OSA (obstructive sleep apnea)   . Osteoporosis   . PAF (paroxysmal atrial fibrillation) ([place])    Event Monitor (8/15):  Frequent PACs, brief bursts of nonsustained ATach; no AFib  . Psoriasis   . Skin cancer   . Vertigo    Social History   Socioeconomic History  . Marital status: Widowed    Spouse name: Not on file  . Number of children: Not on file  . Years of education: Not on file  . Highest education level: Not on file  Social Needs  . Financial resource strain: Not on file  . Food insecurity - worry: Not on file  . Food insecurity - inability: Not on file  . Transportation needs - medical: Not on file  . Transportation needs - non-medical: Not on file  Occupational History  . Not on file  Tobacco Use  . Smoking status: Former Smoker    Packs/day: 1.00    Years: 2.00    Pack years: 2.00    Types: Cigarettes    Last attempt to quit: 04/03/1981    Years since quitting: 35.8  . Smokeless tobacco: Never Used  Substance and Sexual Activity  . Alcohol use: No  . Drug use: No  . Sexual activity: Not Currently  Other Topics Concern  . Not on file  Social History Narrative   She lives with her sister in [place] and requires assistance at home. Has an aid who visits and helps at home often. Son works as a Catering manager and also visits and helps when he can. Semi-independent in ADLs, requires walker around home, dependent in IADLs.   Family History  Problem Relation Age of Onset  . Heart attack Father   . Heart attack Brother   . Stroke Neg Hx    ASSESSMENT Recent Results: Lab Results  Component Value Date   INR 2.2 02/05/2017   INR 1.9 01/29/2017   INR 2.6 01/22/2017   Anticoagulation Dosing: 1 tablet (2.5 mg) daily    INR today: Therapeutic  PLAN Weekly dose was unchanged  Patient Instructions   Patient educated about medication as defined in this encounter and verbalized understanding by repeating back instructions provided.   Patient advised to contact clinic or seek medical attention if signs/symptoms of bleeding or thromboembolism occur.  Patient verbalized understanding by repeating back information and was advised to contact me if further medication-related questions arise. Patient was also provided an information handout.  Follow-up Return in about 1 week (around 02/13/2017).  [name]

## 2017-02-06 NOTE — Telephone Encounter (Signed)
[facility] Pre-operative Risk Assessment    Request for surgical clearance:  1. What type of surgery is being performed? Dental procedure   2. When is this surgery scheduled? Not listed/says fax before 02-07-17  3. Are there any medications that need to be held prior to surgery and how long? Coumadin and Antibiotics  4. Practice name and name of physician performing surgery?  [facility]   5. What is your office phone and fax number?  [phone] fax [phone]   6. Anesthesia type (None, local, MAC, general) ? Not listed  Other concerns listed:  1-liver disease, SOB, hip/knee/joint prosthesis and lung disease(?) 2-risk level, antibiotics, contraindications, or any other meds to hold.   Jacqueline Nguyen 02/06/2017, 10:32 AM  _________________________________________________________________   (provider comments below)

## 2017-02-07 LAB — PROTIME-INR

## 2017-02-07 NOTE — Telephone Encounter (Signed)
Spoke with the Home [facility] for [place].  They do have a a location in [place], [place] for her to use for her HH/PT Services.   Spoke with Janet Berlin with the [facility] and she is working on this Referral request and will be calling back to give me her [place] date.  Also left a message notifying the patient that Alvis Lemmings will be contacting her in regards to rec'ing [place].

## 2017-02-07 NOTE — Addendum Note (Signed)
Addended by: Gilles Chiquito B on: 02/07/2017 04:06 PM   Modules accepted: Orders

## 2017-02-07 NOTE — Progress Notes (Signed)
I have reviewed Dr. Julianne Rice note and agree with plan.

## 2017-02-08 NOTE — Telephone Encounter (Signed)
Patient with diagnosis of atrial fibrillation on warfarin for anticoagulation.    Procedure: dental procedure Date of procedure: TBD  CHADS2-VASc score of  4 (HTN, AGE x 2, female)  CrCl 120 Platelet count365  For cleanings, fillings, crowns and the extraction of a single tooth we recommend that warfarin NOT be held.  For more involved extractions may hold warfarin for up to 5 days.  Please contact our Coumadin Clinic for more exact information about procedures for clarification.  She would not need to be bridged with enoxaparin for warfarin holds  Patient does not need dental prophylaxis prior to dental appointments.

## 2017-02-08 NOTE — Telephone Encounter (Signed)
   Chart reviewed as part of pre-operative protocol coverage.   Per telephone note 01/29/17 by Dr. Sallyanne Kuster -- "From a Cardiology point of view, no significant restrictions for oral surgery. Caution with sedatives due to respiratory problems".   Will route to pharmacy for anticoagulation review.    [place], PA 02/08/2017, 3:41 PM

## 2017-02-09 ENCOUNTER — Telehealth: Payer: Self-pay | Admitting: Internal Medicine

## 2017-02-09 DIAGNOSIS — G894 Chronic pain syndrome: Secondary | ICD-10-CM | POA: Diagnosis not present

## 2017-02-09 DIAGNOSIS — L989 Disorder of the skin and subcutaneous tissue, unspecified: Secondary | ICD-10-CM | POA: Diagnosis not present

## 2017-02-09 DIAGNOSIS — I480 Paroxysmal atrial fibrillation: Secondary | ICD-10-CM | POA: Diagnosis not present

## 2017-02-09 DIAGNOSIS — C44311 Basal cell carcinoma of skin of nose: Secondary | ICD-10-CM | POA: Diagnosis not present

## 2017-02-09 DIAGNOSIS — R5383 Other fatigue: Secondary | ICD-10-CM | POA: Diagnosis not present

## 2017-02-09 DIAGNOSIS — I10 Essential (primary) hypertension: Secondary | ICD-10-CM | POA: Diagnosis not present

## 2017-02-09 DIAGNOSIS — I279 Pulmonary heart disease, unspecified: Secondary | ICD-10-CM | POA: Diagnosis not present

## 2017-02-09 DIAGNOSIS — R42 Dizziness and giddiness: Secondary | ICD-10-CM | POA: Diagnosis not present

## 2017-02-09 DIAGNOSIS — J9610 Chronic respiratory failure, unspecified whether with hypoxia or hypercapnia: Secondary | ICD-10-CM | POA: Diagnosis not present

## 2017-02-09 DIAGNOSIS — J449 Chronic obstructive pulmonary disease, unspecified: Secondary | ICD-10-CM | POA: Diagnosis not present

## 2017-02-09 DIAGNOSIS — G4733 Obstructive sleep apnea (adult) (pediatric): Secondary | ICD-10-CM | POA: Diagnosis not present

## 2017-02-09 NOTE — Telephone Encounter (Signed)
[facility] calls for VO to see pt 2x week for 8 weeks for skin breakdown, disease management,medication management, VO given, do you agree?

## 2017-02-09 NOTE — Telephone Encounter (Signed)
Called [facility] to f/u with [name] [facility].  [facility] was unable to service patient until next week.  Per [facility] was seen today 011/12/2016 for her [facility].

## 2017-02-10 DIAGNOSIS — G4733 Obstructive sleep apnea (adult) (pediatric): Secondary | ICD-10-CM | POA: Diagnosis not present

## 2017-02-12 ENCOUNTER — Encounter: Payer: Self-pay | Admitting: Pharmacist

## 2017-02-12 ENCOUNTER — Telehealth: Payer: Self-pay

## 2017-02-12 DIAGNOSIS — I480 Paroxysmal atrial fibrillation: Secondary | ICD-10-CM | POA: Diagnosis not present

## 2017-02-12 DIAGNOSIS — Z86718 Personal history of other venous thrombosis and embolism: Secondary | ICD-10-CM

## 2017-02-12 DIAGNOSIS — Z7901 Long term (current) use of anticoagulants: Secondary | ICD-10-CM | POA: Diagnosis not present

## 2017-02-12 DIAGNOSIS — Z86711 Personal history of pulmonary embolism: Secondary | ICD-10-CM

## 2017-02-12 LAB — POCT INR: INR: 2.2

## 2017-02-12 NOTE — Patient Instructions (Signed)
Patient and Caregiver educated about medication as defined in this encounter and verbalized understanding by repeating back instructions provided.   

## 2017-02-12 NOTE — Progress Notes (Signed)
Anticoagulation Management Jacqueline Nguyen a 83 y.o.femalewho is on warfarintreatment.   Indication: DVT and PEhistory, paroxysmal atrial fibrillation Duration: indefinite Supervising physician: Gilles Chiquito  Patient reports no signs or symptoms of bleeding or thromboembolism.  Anticoagulation Episode Summary    Current INR goal:   2.0-3.0  TTR:   79.0 % (6.6 mo)  Next INR check:   02/19/2017  INR from last check:   2.2 (02/12/2017)  Weekly max warfarin dose:     Target end date:     INR check location:     Preferred lab:     Send INR reminders to:      Indications   History of DVT (deep vein thrombosis) [Z86.718] Anticoagulated on Coumadin [Z51.81 Z79.01] History of pulmonary embolism [Z86.711]       Comments:         Anticoagulation Care Providers    Provider Role Specialty Phone number   [name], MD Responsible Internal Medicine [phone]     Allergies  Allergen Reactions  . Ciprofloxacin   . Bactrim Rash  . Codeine Nausea Only   Medication Sig  acetic acid-hydrocortisone (VOSOL-HC) otic solution Place 3 drops into the right ear 3 (three) times daily. For seven days.  amLODipine (NORVASC) 5 MG tablet Take 1 tablet (5 mg total) by mouth daily.  Ascorbic Acid (VITAMIN C PO) Take 1 capsule by mouth daily.  atenolol (TENORMIN) 25 MG tablet Take 1 tablet (25 mg total) by mouth 4 (four) times daily as needed. MAY TAKE 1 TABLET 4 TIMES DAILY AS NEEDED.  AZOPT 1 % ophthalmic suspension Place 1 drop into both eyes 2 (two) times daily.  budesonide-formoterol (SYMBICORT) 160-4.5 MCG/ACT inhaler Inhale 2 puffs into the lungs 2 (two) times daily. Rinse mouth after each use  Cholecalciferol (VITAMIN D PO) Take 1 capsule by mouth daily.  COMBIGAN 0.2-0.5 % ophthalmic solution Place 1 drop into both eyes 2 (two) times daily.  cyanocobalamin 1000 MCG tablet Take 1,000 mcg by mouth daily.  diltiazem (CARDIZEM CD) 120 MG 24 hr capsule TAKE ONE (1) CAPSULE BY MOUTH  2 TIMES DAILY  DM-Doxylamine-Acetaminophen (NYQUIL COLD & FLU PO) Take 10 mLs by mouth daily as needed (mucus).  FLOVENT HFA 44 MCG/ACT inhaler INHALE 1 PUFF TWICE A DAY DAILY  furosemide (LASIX) 20 MG tablet TAKE 1 TABLET (20 MG TOTAL) BY MOUTH DAILY.  Homeopathic Products (ARNICARE ARNICA) CREA Apply 1 application topically daily.   HYDROcodone-acetaminophen (NORCO) 10-325 MG tablet Take 1 tablet by mouth every 6 (six) hours as needed for severe pain.  levalbuterol (XOPENEX) 0.63 MG/3ML nebulizer solution Take 3 mLs (0.63 mg total) by nebulization every 8 (eight) hours as needed for wheezing.  magic mouthwash w/lidocaine SOLN Take 5 mLs by mouth 3 (three) times daily as needed for mouth pain.  meclizine (ANTIVERT) 25 MG tablet Take 1 tablet (25 mg total) by mouth 2 (two) times daily as needed for dizziness. TAKE ONE TABLET 2-3 TIMES A DAY AS NEEDED FOR VERTIGO  Multiple Vitamin (MULTIVITAMIN WITH MINERALS) TABS tablet Take 1 tablet by mouth daily.  NON FORMULARY Place 4 L into the nose at bedtime. 3 liter as needed during day depending on extertion   NON FORMULARY Apply 1 application topically daily as needed. [facility] Herbal Heal Ointment for damaged skin  NON FORMULARY Take 1-2 sprays by mouth daily as needed. Mouth Kote Dry Mouth Spray  Nystatin (NYAMYC) 100000 UNIT/GM POWD Apply topically.  OVER THE COUNTER MEDICATION "Calmer" sleep aid -  Take 1 tablet by mouth daily as needed for sleep.  predniSONE (DELTASONE) 10 MG tablet 4 tabs x 2 days, 3 tabs x 2 days, 2 tabs x 2 days, 1 tab x 2 days.  Respiratory Therapy Supplies (FLUTTER) DEVI Blow through 4 times per set, three sets daily  warfarin (COUMADIN) 2.5 MG tablet Take 1 tablet (2.5 mg total) by mouth daily.   Past Medical History:  Diagnosis Date  . Arthritis   . Cataract   . Chronic respiratory failure ([place])   . Endometriosis   . HTN (hypertension)   . Hx of echocardiogram    Echo (8/15):  Mild LVH, EF 65%, no RWMA, Ao  sclerosis, no AS, mod MAC, mild LAE, normal RVSF, PASP 40 mmHg  . Hx pulmonary embolism    on chronic coumadin  . Obesities, morbid ([place])   . OSA (obstructive sleep apnea)   . Osteoporosis   . PAF (paroxysmal atrial fibrillation) ([place])    Event Monitor (8/15):  Frequent PACs, brief bursts of nonsustained ATach; no AFib  . Psoriasis   . Skin cancer   . Vertigo    Social History   Socioeconomic History  . Marital status: Widowed    Spouse name: Not on file  . Number of children: Not on file  . Years of education: Not on file  . Highest education level: Not on file  Social Needs  . Financial resource strain: Not on file  . Food insecurity - worry: Not on file  . Food insecurity - inability: Not on file  . Transportation needs - medical: Not on file  . Transportation needs - non-medical: Not on file  Occupational History  . Not on file  Tobacco Use  . Smoking status: Former Smoker    Packs/day: 1.00    Years: 2.00    Pack years: 2.00    Types: Cigarettes    Last attempt to quit: 04/03/1981    Years since quitting: 35.8  . Smokeless tobacco: Never Used  Substance and Sexual Activity  . Alcohol use: No  . Drug use: No  . Sexual activity: Not Currently  Other Topics Concern  . Not on file  Social History Narrative   She lives with her sister in [place] and requires assistance at home. Has an aid who visits and helps at home often. Son works as a Catering manager and also visits and helps when he can. Semi-independent in ADLs, requires walker around home, dependent in IADLs.   Family History  Problem Relation Age of Onset  . Heart attack Father   . Heart attack Brother   . Stroke Neg Hx    ASSESSMENT Recent Results: Lab Results  Component Value Date   INR 2.2 02/12/2017   INR 2.2 02/05/2017   INR 1.9 01/29/2017   Anticoagulation Dosing: 2.5 mg daily   INR today: Therapeutic  PLAN Weekly dose was unchanged   Patient Instructions  Patient and Caregiver  educated about medication as defined in this encounter and verbalized understanding by repeating back instructions provided.   Patient advised to contact clinic or seek medical attention if signs/symptoms of bleeding or thromboembolism occur.  Patient verbalized understanding by repeating back information and was advised to contact me if further medication-related questions arise. Patient was also provided an information handout.  Follow-up Return in about 1 week (around 02/19/2017).  [name]

## 2017-02-12 NOTE — Telephone Encounter (Signed)
Agree 

## 2017-02-12 NOTE — Telephone Encounter (Signed)
VO for HH PT 2x week for 4 weeks, 3 x week for 4 weeks for therapeutic exercise for endurance, do you agree? Also med list again to [phone]

## 2017-02-12 NOTE — Telephone Encounter (Signed)
Ally with [facility] requesting VO. Please call back.

## 2017-02-12 NOTE — Progress Notes (Signed)
I reviewed Dr. Julianne Rice documentation re: [name].  INR at goal and coumadin dose unchanged.

## 2017-02-13 DIAGNOSIS — R42 Dizziness and giddiness: Secondary | ICD-10-CM | POA: Diagnosis not present

## 2017-02-13 DIAGNOSIS — I279 Pulmonary heart disease, unspecified: Secondary | ICD-10-CM | POA: Diagnosis not present

## 2017-02-13 DIAGNOSIS — L989 Disorder of the skin and subcutaneous tissue, unspecified: Secondary | ICD-10-CM | POA: Diagnosis not present

## 2017-02-13 DIAGNOSIS — G894 Chronic pain syndrome: Secondary | ICD-10-CM | POA: Diagnosis not present

## 2017-02-13 DIAGNOSIS — I10 Essential (primary) hypertension: Secondary | ICD-10-CM | POA: Diagnosis not present

## 2017-02-13 DIAGNOSIS — J449 Chronic obstructive pulmonary disease, unspecified: Secondary | ICD-10-CM | POA: Diagnosis not present

## 2017-02-13 DIAGNOSIS — J9610 Chronic respiratory failure, unspecified whether with hypoxia or hypercapnia: Secondary | ICD-10-CM | POA: Diagnosis not present

## 2017-02-13 DIAGNOSIS — C44311 Basal cell carcinoma of skin of nose: Secondary | ICD-10-CM | POA: Diagnosis not present

## 2017-02-13 DIAGNOSIS — R5383 Other fatigue: Secondary | ICD-10-CM | POA: Diagnosis not present

## 2017-02-13 DIAGNOSIS — G4733 Obstructive sleep apnea (adult) (pediatric): Secondary | ICD-10-CM | POA: Diagnosis not present

## 2017-02-13 DIAGNOSIS — I480 Paroxysmal atrial fibrillation: Secondary | ICD-10-CM | POA: Diagnosis not present

## 2017-02-13 NOTE — Telephone Encounter (Signed)
Agree. Thanks

## 2017-02-14 ENCOUNTER — Ambulatory Visit (INDEPENDENT_AMBULATORY_CARE_PROVIDER_SITE_OTHER): Payer: Medicare Other | Admitting: Internal Medicine

## 2017-02-14 ENCOUNTER — Ambulatory Visit: Payer: Medicare Other | Admitting: Internal Medicine

## 2017-02-14 ENCOUNTER — Encounter: Payer: Self-pay | Admitting: Internal Medicine

## 2017-02-14 VITALS — BP 138/74 | HR 68 | Ht 66.0 in | Wt 225.0 lb

## 2017-02-14 DIAGNOSIS — Z7901 Long term (current) use of anticoagulants: Secondary | ICD-10-CM

## 2017-02-14 DIAGNOSIS — J449 Chronic obstructive pulmonary disease, unspecified: Secondary | ICD-10-CM | POA: Diagnosis not present

## 2017-02-14 DIAGNOSIS — G4733 Obstructive sleep apnea (adult) (pediatric): Secondary | ICD-10-CM

## 2017-02-14 DIAGNOSIS — B369 Superficial mycosis, unspecified: Secondary | ICD-10-CM | POA: Diagnosis not present

## 2017-02-14 DIAGNOSIS — J9611 Chronic respiratory failure with hypoxia: Secondary | ICD-10-CM

## 2017-02-14 DIAGNOSIS — Z6841 Body Mass Index (BMI) 40.0 and over, adult: Secondary | ICD-10-CM

## 2017-02-14 DIAGNOSIS — R918 Other nonspecific abnormal finding of lung field: Secondary | ICD-10-CM | POA: Diagnosis not present

## 2017-02-14 DIAGNOSIS — I2782 Chronic pulmonary embolism: Secondary | ICD-10-CM | POA: Diagnosis not present

## 2017-02-14 MED ORDER — CLOTRIMAZOLE 1 % EX CREA: 1.0000 "application " | TOPICAL_CREAM | Freq: Two times a day (BID) | CUTANEOUS | 1 refills | Status: DC

## 2017-02-14 NOTE — Progress Notes (Signed)
F former smoker followed for OSA, Chronic resp failure, complicated by PAF, PHTN/ coumadin, morbid obesity, glaucoma Office Spirometry 11/26/13- severe restriction w FVC 1.43/ 49%, FEV1 0.95/ 45%, FEV1/FVC 0.66, FEF25-75% 0.52/33%, but poor technique with delayed exhalation so this may not be best capability.  --------------------------------------------------------------------------------  03/03/2016-83 year old female former smoker followed for OSA, Chronic respiratory failure, complicated by PAF, PTHN/Coumadin, morbid obesity CPAP 9/[facility] with O2 4 L FOLLOWS FOR:  pt denies any complaints with cpap machine at this time.  Noticing more frequent episodes of A. fib, worse in the mornings so she questions relationship to her CPAP. Otherwise CPAP seems to be doing fine but we have no download this time. She says she couldn't sleep without it. CXR 02/25/2016 IMPRESSION: Cardiomegaly. Mild bronchitic changes with bibasilar atelectasis or scarring.  02/14/17- 83 year old female former smoker followed for OSA, Chronic respiratory failure, complicated by PAF, PTHN/Coumadin, morbid obesity, glaucoma CPAP 9/[facility] with O2 4 L         Son  Is with her today ---Pt is having breathing issues for last 3 weeks. Pt is having SOB with exertion, and pt is requesting a new cpap machine. Nebs Xopenex, Symbicort 160 She says she cannot sleep without CPAP/oxygen and depends on it.  Machine is old and may not be working right. More short of breath over the last month with lifting arms overhead, sustained conversation.  Some cough with thick white sputum.  Flutter device does help. We discussed her CT results from October and reviewed image. CT chest 01/08/17- IMPRESSION: Slightly motion degraded examination. No evidence of pulmonary embolus. Cardiomegaly. Mitral valve and aortic valve calcification. Coronary artery calcification. Slightly prominent right hilar lymph nodes minimally more notable than on  prior exam. Right middle lobe 6 mm nodule unchanged from 2015 (series 6, image 79). Peripheral right middle lobe ground glass opacities measuring 3 and 5 mm (series 6 image 85 and series 10 images 48 through 50) new from prior exam. Non-contrast chest CT at 3-6 months is recommended. If nodules persist and are stable at that time, consider additional non-contrast chest CT examinations at 2 and 4 years. This recommendation follows the consensus statement: Guidelines for Management of Incidental Pulmonary Nodules Detected on CT Images: From the Fleischner Society 2017; Radiology 2017; 284:228-243. New from 2017 although possibly remote is T11 anterior wedge compression fracture involving superior endplate with 18% loss of height anteriorly and slight retropulsion posterosuperior aspect. Kyphosis at this level with slight impression upon the cord which is draped over this region. Aortic Atherosclerosis (ICD10-I70.0).    ROS-see HPI  + = positive Constitutional:   No-   weight loss, night sweats, fevers, chills, fatigue, lassitude. HEENT:   No-  headaches, difficulty swallowing, tooth/dental problems, sore throat,       No-  sneezing, itching, ear ache, nasal congestion, post nasal drip,  CV:  No-   chest pain, orthopnea, PND, +swelling in lower extremities, anasarca,                                                                     dizziness, +palpitations Resp: +shortness of breath with exertion or at rest.              +   productive cough,  No  non-productive cough,  No- coughing up of blood.              No-   change in color of mucus.  No- wheezing.   Skin: No-   rash or lesions. GI:  No-   heartburn, indigestion, abdominal pain, nausea, vomiting,  GU:  MS:  No-   joint pain or swelling.  . Neuro-     nothing unusual Psych:  No- change in mood or affect. No depression or anxiety.  No memory loss.  OBJ- Physical Exam   Talkative, + morbidly obese, + power scooter General- Alert,  Oriented, Affect-appropriate, Distress- none acute,  walker,  Skin- rash-none, lesions- none, excoriation- none Lymphadenopathy- none Head- atraumatic            Eyes- Gross vision intact, PERRLA, conjunctivae and secretions clear            Ears- Hearing, canals-normal            Nose- Clear, no-Septal dev, mucus, polyps, erosion, perforation             Throat- Mallampati II , mucosa clear , drainage- none, tonsils- atrophic Neck- flexible , trachea midline, no stridor , thyroid nl, carotid no bruit Chest - symmetrical excursion , unlabored           Heart/CV- RRR , no murmur , no gallop  , no rub, nl s1 s2                           - JVD- none , edema+1-2, stasis changes- none, varices- none           Lung- clear to P&A/unlabored sitting, wheeze- none, cough- none , dullness-none,              rub- none, + seems comfortable sitting on room air           Chest wall-  Abd-  Br/ Gen/ Rectal- Not done, not indicated Extrem- cyanosis- none, clubbing, none, atrophy- none, strength- nl, +elastic hose,                          +wheelchair,  Neuro- grossly intact to observation

## 2017-02-14 NOTE — Progress Notes (Signed)
   CC: evaluation of skin rash vs ulcer  HPI:  [name] is a 83 y.o. presents today for evaluation of a skin ulcer overlying the sacrum. She has a history of decreased mobility secondary to multiple etiologies prompting concern for skin breakdown.  Denies fever, chills, nausea, vomiting, diarrhea, constipation, concerns for tenderness or local signs of infection.  Past Medical History:  Diagnosis Date  . Arthritis   . Cataract   . Chronic respiratory failure ([place])   . Endometriosis   . HTN (hypertension)   . Hx of echocardiogram    Echo (8/15):  Mild LVH, EF 65%, no RWMA, Ao sclerosis, no AS, mod MAC, mild LAE, normal RVSF, PASP 40 mmHg  . Hx pulmonary embolism    on chronic coumadin  . Obesities, morbid ([place])   . OSA (obstructive sleep apnea)   . Osteoporosis   . PAF (paroxysmal atrial fibrillation) ([place])    Event Monitor (8/15):  Frequent PACs, brief bursts of nonsustained ATach; no AFib  . Psoriasis   . Skin cancer   . Vertigo    Review of Systems:  ROS negative except as per HPI.  Physical Exam:  Vitals:   02/14/17 1110  Weight: 258 lb 8 oz (117.3 kg)   Physical Exam  Cardiovascular: Normal rate and regular rhythm.  Pulmonary/Chest: Effort normal and breath sounds normal.  Abdominal: Soft. She exhibits no distension.  Musculoskeletal: She exhibits tenderness (Overlying the lumbar spine, chronic).  Rash is extends approximately 9cm caudally and 4-5cm laterally from the sacral midline. There is evidence of mild skin breakdown at the midline. The rash is erythematous, not raised, spotty, and resembles a fungal skin infection most consistently.   Skin: Rash (Erythemematous rash overlying the sacrum with minimal breakdown of the skin ) noted. There is erythema.     Assessment & Plan:   See Encounters Tab for problem based charting.  Patient seen with Dr. Beryle Beams

## 2017-02-14 NOTE — Patient Instructions (Signed)
Order- schedule future CT chest no contrast in 3 months    Dx multiple lung nodules  Try using your nebulizer machine twice daily now, while you are feeling more short of breath.  Order- DME [facility]- please replace old CPAP machine, change to autoPAP 5- 15, mask of choice, humidifier, supplies, AirView    Dx OSA   Continue bleed in O2 4L  Please call if we can help

## 2017-02-14 NOTE — Patient Instructions (Signed)
You have been given a prescription for clotrimazole 1% cream two times daily approximately 12 hours apart after thoroughly cleaning the area.  This is an over-the-counter medication and can be obtained in a local pharmacy if not covered by insurance.  As discussed please continue to decrease contact with the area by shifting approximate 1 hour each day.  If this rash worsens or symptoms fail to improve please contact our clinic at any time.  Thank you for your visit to [facility]

## 2017-02-14 NOTE — Assessment & Plan Note (Addendum)
Assessment: Fungal dermatitis of the sacrum most likely secondary to prologned sitting leading to increased pressure, contact, moisture and skin degradation.   Plan: Clotrimazole cream topical 1% to be applied two times daily q12 for greater than 14 days. Return or call the clinic if symptoms fail to improve

## 2017-02-14 NOTE — Progress Notes (Signed)
Medicine attending: I personally interviewed and briefly examined this patient on the day of the patient visit and reviewed pertinent clinical  data  with resident physician Dr. Kathi Ludwig and we discussed a management plan. Pt has a localized yeast infection in the skin folds between the buttocks. Appropriate Rx will be instituted.

## 2017-02-15 DIAGNOSIS — J9610 Chronic respiratory failure, unspecified whether with hypoxia or hypercapnia: Secondary | ICD-10-CM | POA: Diagnosis not present

## 2017-02-15 DIAGNOSIS — I10 Essential (primary) hypertension: Secondary | ICD-10-CM | POA: Diagnosis not present

## 2017-02-15 DIAGNOSIS — G4733 Obstructive sleep apnea (adult) (pediatric): Secondary | ICD-10-CM | POA: Diagnosis not present

## 2017-02-15 DIAGNOSIS — L989 Disorder of the skin and subcutaneous tissue, unspecified: Secondary | ICD-10-CM | POA: Diagnosis not present

## 2017-02-15 DIAGNOSIS — R42 Dizziness and giddiness: Secondary | ICD-10-CM | POA: Diagnosis not present

## 2017-02-15 DIAGNOSIS — I279 Pulmonary heart disease, unspecified: Secondary | ICD-10-CM | POA: Diagnosis not present

## 2017-02-15 DIAGNOSIS — R5383 Other fatigue: Secondary | ICD-10-CM | POA: Diagnosis not present

## 2017-02-15 DIAGNOSIS — G894 Chronic pain syndrome: Secondary | ICD-10-CM | POA: Diagnosis not present

## 2017-02-15 DIAGNOSIS — J449 Chronic obstructive pulmonary disease, unspecified: Secondary | ICD-10-CM | POA: Diagnosis not present

## 2017-02-15 DIAGNOSIS — I480 Paroxysmal atrial fibrillation: Secondary | ICD-10-CM | POA: Diagnosis not present

## 2017-02-15 DIAGNOSIS — C44311 Basal cell carcinoma of skin of nose: Secondary | ICD-10-CM | POA: Diagnosis not present

## 2017-02-16 DIAGNOSIS — J449 Chronic obstructive pulmonary disease, unspecified: Secondary | ICD-10-CM | POA: Diagnosis not present

## 2017-02-16 DIAGNOSIS — I10 Essential (primary) hypertension: Secondary | ICD-10-CM | POA: Diagnosis not present

## 2017-02-16 DIAGNOSIS — L989 Disorder of the skin and subcutaneous tissue, unspecified: Secondary | ICD-10-CM | POA: Diagnosis not present

## 2017-02-16 DIAGNOSIS — G4733 Obstructive sleep apnea (adult) (pediatric): Secondary | ICD-10-CM | POA: Diagnosis not present

## 2017-02-16 DIAGNOSIS — R5383 Other fatigue: Secondary | ICD-10-CM | POA: Diagnosis not present

## 2017-02-16 DIAGNOSIS — I480 Paroxysmal atrial fibrillation: Secondary | ICD-10-CM | POA: Diagnosis not present

## 2017-02-16 DIAGNOSIS — J9610 Chronic respiratory failure, unspecified whether with hypoxia or hypercapnia: Secondary | ICD-10-CM | POA: Diagnosis not present

## 2017-02-16 DIAGNOSIS — R42 Dizziness and giddiness: Secondary | ICD-10-CM | POA: Diagnosis not present

## 2017-02-16 DIAGNOSIS — G894 Chronic pain syndrome: Secondary | ICD-10-CM | POA: Diagnosis not present

## 2017-02-16 DIAGNOSIS — I279 Pulmonary heart disease, unspecified: Secondary | ICD-10-CM | POA: Diagnosis not present

## 2017-02-16 DIAGNOSIS — C44311 Basal cell carcinoma of skin of nose: Secondary | ICD-10-CM | POA: Diagnosis not present

## 2017-02-19 ENCOUNTER — Ambulatory Visit (INDEPENDENT_AMBULATORY_CARE_PROVIDER_SITE_OTHER): Payer: Medicare Other | Admitting: Pharmacist

## 2017-02-19 DIAGNOSIS — Z7901 Long term (current) use of anticoagulants: Secondary | ICD-10-CM

## 2017-02-19 DIAGNOSIS — Z5181 Encounter for therapeutic drug level monitoring: Secondary | ICD-10-CM

## 2017-02-19 DIAGNOSIS — R42 Dizziness and giddiness: Secondary | ICD-10-CM | POA: Diagnosis not present

## 2017-02-19 DIAGNOSIS — I10 Essential (primary) hypertension: Secondary | ICD-10-CM | POA: Diagnosis not present

## 2017-02-19 DIAGNOSIS — Z86711 Personal history of pulmonary embolism: Secondary | ICD-10-CM

## 2017-02-19 DIAGNOSIS — J9610 Chronic respiratory failure, unspecified whether with hypoxia or hypercapnia: Secondary | ICD-10-CM | POA: Diagnosis not present

## 2017-02-19 DIAGNOSIS — I480 Paroxysmal atrial fibrillation: Secondary | ICD-10-CM | POA: Diagnosis not present

## 2017-02-19 DIAGNOSIS — G894 Chronic pain syndrome: Secondary | ICD-10-CM | POA: Diagnosis not present

## 2017-02-19 DIAGNOSIS — Z86718 Personal history of other venous thrombosis and embolism: Secondary | ICD-10-CM

## 2017-02-19 DIAGNOSIS — G4733 Obstructive sleep apnea (adult) (pediatric): Secondary | ICD-10-CM | POA: Diagnosis not present

## 2017-02-19 DIAGNOSIS — J449 Chronic obstructive pulmonary disease, unspecified: Secondary | ICD-10-CM | POA: Diagnosis not present

## 2017-02-19 DIAGNOSIS — L989 Disorder of the skin and subcutaneous tissue, unspecified: Secondary | ICD-10-CM | POA: Diagnosis not present

## 2017-02-19 DIAGNOSIS — R5383 Other fatigue: Secondary | ICD-10-CM | POA: Diagnosis not present

## 2017-02-19 DIAGNOSIS — C44311 Basal cell carcinoma of skin of nose: Secondary | ICD-10-CM | POA: Diagnosis not present

## 2017-02-19 DIAGNOSIS — I279 Pulmonary heart disease, unspecified: Secondary | ICD-10-CM | POA: Diagnosis not present

## 2017-02-19 LAB — POCT INR: INR: 1.8

## 2017-02-20 ENCOUNTER — Telehealth: Payer: Self-pay | Admitting: Internal Medicine

## 2017-02-20 ENCOUNTER — Telehealth: Payer: Self-pay | Admitting: *Deleted

## 2017-02-20 DIAGNOSIS — R918 Other nonspecific abnormal finding of lung field: Secondary | ICD-10-CM | POA: Insufficient documentation

## 2017-02-20 DIAGNOSIS — G894 Chronic pain syndrome: Secondary | ICD-10-CM | POA: Diagnosis not present

## 2017-02-20 DIAGNOSIS — C44311 Basal cell carcinoma of skin of nose: Secondary | ICD-10-CM | POA: Diagnosis not present

## 2017-02-20 DIAGNOSIS — I10 Essential (primary) hypertension: Secondary | ICD-10-CM | POA: Diagnosis not present

## 2017-02-20 DIAGNOSIS — J449 Chronic obstructive pulmonary disease, unspecified: Secondary | ICD-10-CM | POA: Diagnosis not present

## 2017-02-20 DIAGNOSIS — R5383 Other fatigue: Secondary | ICD-10-CM | POA: Diagnosis not present

## 2017-02-20 DIAGNOSIS — G4733 Obstructive sleep apnea (adult) (pediatric): Secondary | ICD-10-CM | POA: Diagnosis not present

## 2017-02-20 DIAGNOSIS — I480 Paroxysmal atrial fibrillation: Secondary | ICD-10-CM | POA: Diagnosis not present

## 2017-02-20 DIAGNOSIS — J9610 Chronic respiratory failure, unspecified whether with hypoxia or hypercapnia: Secondary | ICD-10-CM | POA: Diagnosis not present

## 2017-02-20 DIAGNOSIS — I279 Pulmonary heart disease, unspecified: Secondary | ICD-10-CM | POA: Diagnosis not present

## 2017-02-20 DIAGNOSIS — R42 Dizziness and giddiness: Secondary | ICD-10-CM | POA: Diagnosis not present

## 2017-02-20 DIAGNOSIS — L989 Disorder of the skin and subcutaneous tissue, unspecified: Secondary | ICD-10-CM | POA: Diagnosis not present

## 2017-02-20 NOTE — Patient Instructions (Signed)
Patient and Caregiver educated about medication as defined in this encounter and verbalized understanding by repeating back instructions provided.

## 2017-02-20 NOTE — Telephone Encounter (Signed)
Call from [place] - stated pt tested at home yesterday; INR 1.8.  According to Epic, Jacqueline Nguyen already knew about INR yesterday.

## 2017-02-20 NOTE — Assessment & Plan Note (Signed)
No break in therapy.  She feels she cannot sleep without CPAP plus supplemental oxygen and uses it every night.  Old machine needs to be replaced.  Plan-replace old CPAP machine, changing to AutoPap and continuing supplemental oxygen.

## 2017-02-20 NOTE — Progress Notes (Signed)
Indication: Paroxysmal atrial fibrillation. Duration: Indefinite. INR: Below target. Agree with Dr. Julianne Rice assessment and plan.

## 2017-02-20 NOTE — Telephone Encounter (Signed)
Done- note is complete

## 2017-02-20 NOTE — Assessment & Plan Note (Signed)
She remains dependent on oxygen for sleep

## 2017-02-20 NOTE — Telephone Encounter (Signed)
Spoke with [facility] in [place]. They asked if CY can put more detail in her chart notes to explain the usage and  benefits of CPAP. It seems as if pt was having trouble with CPAP so she probably didn't use the CPAP much. CY please advise.   Fax note to [facility] [phone]

## 2017-02-20 NOTE — Assessment & Plan Note (Signed)
Peripheral right middle lobe groundglass opacities.  Radiology recommends CT follow-up at 3-6 months as discussed with her. Plan-chest CT to reassess lung nodules

## 2017-02-20 NOTE — Telephone Encounter (Signed)
Spoke with pt. She is aware that we are going to fax this OV note to [place]. OV note has been faxed. Nothing further was needed.

## 2017-02-20 NOTE — Progress Notes (Signed)
Anticoagulation Management Jacqueline Nguyen a 83 y.o.femalewho is on warfarintreatment.   Indication: DVT and PEhistory, paroxysmal atrial fibrillation Duration: indefinite Supervising physician: Gilles Chiquito  Patient reports no signs or symptoms of bleeding or thromboembolism.  Anticoagulation Episode Summary    Current INR goal:   2.0-3.0  TTR:   77.9 % (6.9 mo)  Next INR check:   02/26/2017  INR from last check:     Most recent INR:    1.8! (02/20/2017)  Weekly max warfarin dose:     Target end date:     INR check location:     Preferred lab:     Send INR reminders to:      Indications   History of DVT (deep vein thrombosis) [Z86.718] Anticoagulated on Coumadin [Z51.81 Z79.01] History of pulmonary embolism [Z86.711]       Comments:         Anticoagulation Care Providers    Provider Role Specialty Phone number   [name], MD Responsible Internal Medicine [phone]     Allergies  Allergen Reactions  . Ciprofloxacin   . Bactrim Rash  . Codeine Nausea Only   Medication Sig  acetic acid-hydrocortisone (VOSOL-HC) otic solution Place 3 drops into the right ear 3 (three) times daily. For seven days.  Ascorbic Acid (VITAMIN C PO) Take 1 capsule by mouth daily.  atenolol (TENORMIN) 25 MG tablet Take 1 tablet (25 mg total) by mouth 4 (four) times daily as needed. MAY TAKE 1 TABLET 4 TIMES DAILY AS NEEDED.  AZOPT 1 % ophthalmic suspension Place 1 drop into both eyes 2 (two) times daily.  budesonide-formoterol (SYMBICORT) 160-4.5 MCG/ACT inhaler Inhale 2 puffs into the lungs 2 (two) times daily. Rinse mouth after each use  Cholecalciferol (VITAMIN D PO) Take 1 capsule by mouth daily.  clotrimazole (LOTRIMIN) 1 % cream Apply 1 application 2 (two) times daily topically. Approximately 1 gram to the affected area.  COMBIGAN 0.2-0.5 % ophthalmic solution Place 1 drop into both eyes 2 (two) times daily.  cyanocobalamin 1000 MCG tablet Take 1,000 mcg by mouth daily.   diltiazem (CARDIZEM CD) 120 MG 24 hr capsule TAKE ONE (1) CAPSULE BY MOUTH 2 TIMES DAILY  FLOVENT HFA 44 MCG/ACT inhaler INHALE 1 PUFF TWICE A DAY DAILY  furosemide (LASIX) 20 MG tablet TAKE 1 TABLET (20 MG TOTAL) BY MOUTH DAILY.  HYDROcodone-acetaminophen (NORCO) 10-325 MG tablet Take 1 tablet by mouth every 6 (six) hours as needed for severe pain.  levalbuterol (XOPENEX) 0.63 MG/3ML nebulizer solution Take 3 mLs (0.63 mg total) by nebulization every 8 (eight) hours as needed for wheezing.  meclizine (ANTIVERT) 25 MG tablet Take 1 tablet (25 mg total) by mouth 2 (two) times daily as needed for dizziness. TAKE ONE TABLET 2-3 TIMES A DAY AS NEEDED FOR VERTIGO  Multiple Vitamin (MULTIVITAMIN WITH MINERALS) TABS tablet Take 1 tablet by mouth daily.  Nystatin ([place]) 100000 UNIT/GM POWD Apply topically.  predniSONE (DELTASONE) 10 MG tablet 4 tabs x 2 days, 3 tabs x 2 days, 2 tabs x 2 days, 1 tab x 2 days.  Respiratory Therapy Supplies (FLUTTER) DEVI Blow through 4 times per set, three sets daily  warfarin (COUMADIN) 2.5 MG tablet Take 1 tablet (2.5 mg total) by mouth daily.   Past Medical History:  Diagnosis Date  . Arthritis   . Cataract   . Chronic respiratory failure ([place])   . Endometriosis   . HTN (hypertension)   . Hx of echocardiogram    Echo (8/15):  Mild LVH, EF 65%, no RWMA, Ao sclerosis, no AS, mod MAC, mild LAE, normal RVSF, PASP 40 mmHg  . Hx pulmonary embolism    on chronic coumadin  . Obesities, morbid ([place])   . OSA (obstructive sleep apnea)   . Osteoporosis   . PAF (paroxysmal atrial fibrillation) ([place])    Event Monitor (8/15):  Frequent PACs, brief bursts of nonsustained ATach; no AFib  . Psoriasis   . Skin cancer   . Vertigo    Social History   Socioeconomic History  . Marital status: Widowed    Spouse name: Not on file  . Number of children: Not on file  . Years of education: Not on file  . Highest education level: Not on file  Social Needs  . Financial  resource strain: Not on file  . Food insecurity - worry: Not on file  . Food insecurity - inability: Not on file  . Transportation needs - medical: Not on file  . Transportation needs - non-medical: Not on file  Occupational History  . Not on file  Tobacco Use  . Smoking status: Former Smoker    Packs/day: 1.00    Years: 2.00    Pack years: 2.00    Types: Cigarettes    Last attempt to quit: 04/03/1981    Years since quitting: 35.9  . Smokeless tobacco: Never Used  Substance and Sexual Activity  . Alcohol use: No  . Drug use: No  . Sexual activity: Not Currently  Other Topics Concern  . Not on file  Social History Narrative   She lives with her sister in [place] and requires assistance at home. Has an aid who visits and helps at home often. Son works as a Catering manager and also visits and helps when he can. Semi-independent in ADLs, requires walker around home, dependent in IADLs.   Family History  Problem Relation Age of Onset  . Heart attack Father   . Heart attack Brother   . Stroke Neg Hx    ASSESSMENT Recent Results: Lab Results  Component Value Date   INR 1.8 02/20/2017   INR 2.2 02/12/2017   INR 2.2 02/05/2017   Anticoagulation Dosing: 2.5 mg daily   INR today: Subtherapeutic  PLAN Take 1/2 tablet today (1.25 mg x 1 dose), then continue 2.5 mg daily  Patient Instructions  Patient and Caregiver educated about medication as defined in this encounter and verbalized understanding by repeating back instructions provided.    Patient advised to contact clinic or seek medical attention if signs/symptoms of bleeding or thromboembolism occur.  Patient verbalized understanding by repeating back information and was advised to contact me if further medication-related questions arise. Patient was also provided an information handout.  Follow-up Return in about 1 week (around 02/26/2017).  [name]

## 2017-02-20 NOTE — Assessment & Plan Note (Signed)
We discussed.  Recommend she use her nebulizer machine twice daily for a while to see if she gets symptom improvement.

## 2017-02-21 DIAGNOSIS — G894 Chronic pain syndrome: Secondary | ICD-10-CM | POA: Diagnosis not present

## 2017-02-21 DIAGNOSIS — J449 Chronic obstructive pulmonary disease, unspecified: Secondary | ICD-10-CM | POA: Diagnosis not present

## 2017-02-21 DIAGNOSIS — J9610 Chronic respiratory failure, unspecified whether with hypoxia or hypercapnia: Secondary | ICD-10-CM | POA: Diagnosis not present

## 2017-02-21 DIAGNOSIS — R42 Dizziness and giddiness: Secondary | ICD-10-CM | POA: Diagnosis not present

## 2017-02-21 DIAGNOSIS — I10 Essential (primary) hypertension: Secondary | ICD-10-CM | POA: Diagnosis not present

## 2017-02-21 DIAGNOSIS — G4733 Obstructive sleep apnea (adult) (pediatric): Secondary | ICD-10-CM | POA: Diagnosis not present

## 2017-02-21 DIAGNOSIS — C44311 Basal cell carcinoma of skin of nose: Secondary | ICD-10-CM | POA: Diagnosis not present

## 2017-02-21 DIAGNOSIS — I279 Pulmonary heart disease, unspecified: Secondary | ICD-10-CM | POA: Diagnosis not present

## 2017-02-21 DIAGNOSIS — L989 Disorder of the skin and subcutaneous tissue, unspecified: Secondary | ICD-10-CM | POA: Diagnosis not present

## 2017-02-21 DIAGNOSIS — R5383 Other fatigue: Secondary | ICD-10-CM | POA: Diagnosis not present

## 2017-02-21 DIAGNOSIS — I480 Paroxysmal atrial fibrillation: Secondary | ICD-10-CM | POA: Diagnosis not present

## 2017-02-23 DIAGNOSIS — J449 Chronic obstructive pulmonary disease, unspecified: Secondary | ICD-10-CM | POA: Diagnosis not present

## 2017-02-23 DIAGNOSIS — G4733 Obstructive sleep apnea (adult) (pediatric): Secondary | ICD-10-CM | POA: Diagnosis not present

## 2017-02-23 DIAGNOSIS — G894 Chronic pain syndrome: Secondary | ICD-10-CM | POA: Diagnosis not present

## 2017-02-23 DIAGNOSIS — C44311 Basal cell carcinoma of skin of nose: Secondary | ICD-10-CM | POA: Diagnosis not present

## 2017-02-23 DIAGNOSIS — I10 Essential (primary) hypertension: Secondary | ICD-10-CM | POA: Diagnosis not present

## 2017-02-23 DIAGNOSIS — R42 Dizziness and giddiness: Secondary | ICD-10-CM | POA: Diagnosis not present

## 2017-02-23 DIAGNOSIS — L989 Disorder of the skin and subcutaneous tissue, unspecified: Secondary | ICD-10-CM | POA: Diagnosis not present

## 2017-02-23 DIAGNOSIS — J9610 Chronic respiratory failure, unspecified whether with hypoxia or hypercapnia: Secondary | ICD-10-CM | POA: Diagnosis not present

## 2017-02-23 DIAGNOSIS — I279 Pulmonary heart disease, unspecified: Secondary | ICD-10-CM | POA: Diagnosis not present

## 2017-02-23 DIAGNOSIS — I480 Paroxysmal atrial fibrillation: Secondary | ICD-10-CM | POA: Diagnosis not present

## 2017-02-23 DIAGNOSIS — R5383 Other fatigue: Secondary | ICD-10-CM | POA: Diagnosis not present

## 2017-02-26 DIAGNOSIS — G894 Chronic pain syndrome: Secondary | ICD-10-CM | POA: Diagnosis not present

## 2017-02-26 DIAGNOSIS — R42 Dizziness and giddiness: Secondary | ICD-10-CM | POA: Diagnosis not present

## 2017-02-26 DIAGNOSIS — I279 Pulmonary heart disease, unspecified: Secondary | ICD-10-CM | POA: Diagnosis not present

## 2017-02-26 DIAGNOSIS — C44311 Basal cell carcinoma of skin of nose: Secondary | ICD-10-CM | POA: Diagnosis not present

## 2017-02-26 DIAGNOSIS — R5383 Other fatigue: Secondary | ICD-10-CM | POA: Diagnosis not present

## 2017-02-26 DIAGNOSIS — J449 Chronic obstructive pulmonary disease, unspecified: Secondary | ICD-10-CM | POA: Diagnosis not present

## 2017-02-26 DIAGNOSIS — J9610 Chronic respiratory failure, unspecified whether with hypoxia or hypercapnia: Secondary | ICD-10-CM | POA: Diagnosis not present

## 2017-02-26 DIAGNOSIS — G4733 Obstructive sleep apnea (adult) (pediatric): Secondary | ICD-10-CM | POA: Diagnosis not present

## 2017-02-26 DIAGNOSIS — I480 Paroxysmal atrial fibrillation: Secondary | ICD-10-CM | POA: Diagnosis not present

## 2017-02-26 DIAGNOSIS — L989 Disorder of the skin and subcutaneous tissue, unspecified: Secondary | ICD-10-CM | POA: Diagnosis not present

## 2017-02-26 DIAGNOSIS — I10 Essential (primary) hypertension: Secondary | ICD-10-CM | POA: Diagnosis not present

## 2017-02-26 LAB — POCT INR: INR: 1.7

## 2017-02-27 ENCOUNTER — Telehealth (INDEPENDENT_AMBULATORY_CARE_PROVIDER_SITE_OTHER): Payer: Medicare Other | Admitting: Pharmacist

## 2017-02-27 DIAGNOSIS — I480 Paroxysmal atrial fibrillation: Secondary | ICD-10-CM | POA: Diagnosis not present

## 2017-02-27 DIAGNOSIS — Z5181 Encounter for therapeutic drug level monitoring: Secondary | ICD-10-CM | POA: Diagnosis not present

## 2017-02-27 DIAGNOSIS — Z86711 Personal history of pulmonary embolism: Secondary | ICD-10-CM

## 2017-02-27 DIAGNOSIS — Z86718 Personal history of other venous thrombosis and embolism: Secondary | ICD-10-CM

## 2017-02-27 DIAGNOSIS — Z7901 Long term (current) use of anticoagulants: Secondary | ICD-10-CM

## 2017-02-27 DIAGNOSIS — G4733 Obstructive sleep apnea (adult) (pediatric): Secondary | ICD-10-CM | POA: Diagnosis not present

## 2017-02-28 ENCOUNTER — Telehealth: Payer: Self-pay | Admitting: *Deleted

## 2017-02-28 DIAGNOSIS — R42 Dizziness and giddiness: Secondary | ICD-10-CM | POA: Diagnosis not present

## 2017-02-28 DIAGNOSIS — J9610 Chronic respiratory failure, unspecified whether with hypoxia or hypercapnia: Secondary | ICD-10-CM | POA: Diagnosis not present

## 2017-02-28 DIAGNOSIS — C44311 Basal cell carcinoma of skin of nose: Secondary | ICD-10-CM | POA: Diagnosis not present

## 2017-02-28 DIAGNOSIS — I10 Essential (primary) hypertension: Secondary | ICD-10-CM | POA: Diagnosis not present

## 2017-02-28 DIAGNOSIS — R5383 Other fatigue: Secondary | ICD-10-CM | POA: Diagnosis not present

## 2017-02-28 DIAGNOSIS — J449 Chronic obstructive pulmonary disease, unspecified: Secondary | ICD-10-CM | POA: Diagnosis not present

## 2017-02-28 DIAGNOSIS — I480 Paroxysmal atrial fibrillation: Secondary | ICD-10-CM | POA: Diagnosis not present

## 2017-02-28 DIAGNOSIS — G4733 Obstructive sleep apnea (adult) (pediatric): Secondary | ICD-10-CM | POA: Diagnosis not present

## 2017-02-28 DIAGNOSIS — I279 Pulmonary heart disease, unspecified: Secondary | ICD-10-CM | POA: Diagnosis not present

## 2017-02-28 DIAGNOSIS — L989 Disorder of the skin and subcutaneous tissue, unspecified: Secondary | ICD-10-CM | POA: Diagnosis not present

## 2017-02-28 DIAGNOSIS — G894 Chronic pain syndrome: Secondary | ICD-10-CM | POA: Diagnosis not present

## 2017-02-28 NOTE — Telephone Encounter (Signed)
FYIMaudie Nguyen (PT [facility]) stated patient just woke up BP 182/84, HR 75. Recheck 10 mins BP 182/78. Patient asymptomatic.

## 2017-02-28 NOTE — Progress Notes (Signed)
Anticoagulation Management [name] a 83 y.o.femalewho is onwarfarintreatment.   Indication: DVT and PEhistory, paroxysmal atrial fibrillation Duration: indefinite Supervising physician: Gilles Chiquito  Patient reports no signs or symptoms of bleeding or thromboembolism. She states that she may have had more greens over the Thanksgiving holiday, but feels strongly that the probiotic she has been on for 10 years may be interacting because she is taking it too close to the time she takes warfarin (spacing only by 1 hour).  Anticoagulation Episode Summary    Current INR goal:   2.0-3.0  TTR:   75.4 % (7.1 mo)  Next INR check:   03/05/2017  INR from last check:   1.7! (02/26/2017)  Weekly max warfarin dose:     Target end date:     INR check location:     Preferred lab:     Send INR reminders to:      Indications   History of DVT (deep vein thrombosis) [Z86.718] Anticoagulated on Coumadin [Z51.81 Z79.01] History of pulmonary embolism [Z86.711]       Comments:         Anticoagulation Care Providers    Provider Role Specialty Phone number   [name], MD Responsible Internal Medicine [phone]     Allergies  Allergen Reactions  . Ciprofloxacin   . Bactrim Rash  . Codeine Nausea Only   Medication Sig  acetic acid-hydrocortisone (VOSOL-HC) otic solution Place 3 drops into the right ear 3 (three) times daily. For seven days.  Ascorbic Acid (VITAMIN C PO) Take 1 capsule by mouth daily.  atenolol (TENORMIN) 25 MG tablet Take 1 tablet (25 mg total) by mouth 4 (four) times daily as needed. MAY TAKE 1 TABLET 4 TIMES DAILY AS NEEDED.  AZOPT 1 % ophthalmic suspension Place 1 drop into both eyes 2 (two) times daily.  budesonide-formoterol (SYMBICORT) 160-4.5 MCG/ACT inhaler Inhale 2 puffs into the lungs 2 (two) times daily. Rinse mouth after each use  Cholecalciferol (VITAMIN D PO) Take 1 capsule by mouth daily.  clotrimazole (LOTRIMIN) 1 % cream Apply 1 application  2 (two) times daily topically. Approximately 1 gram to the affected area.  COMBIGAN 0.2-0.5 % ophthalmic solution Place 1 drop into both eyes 2 (two) times daily.  cyanocobalamin 1000 MCG tablet Take 1,000 mcg by mouth daily.  diltiazem (CARDIZEM CD) 120 MG 24 hr capsule TAKE ONE (1) CAPSULE BY MOUTH 2 TIMES DAILY  FLOVENT HFA 44 MCG/ACT inhaler INHALE 1 PUFF TWICE A DAY DAILY  furosemide (LASIX) 20 MG tablet TAKE 1 TABLET (20 MG TOTAL) BY MOUTH DAILY.  HYDROcodone-acetaminophen (NORCO) 10-325 MG tablet Take 1 tablet by mouth every 6 (six) hours as needed for severe pain.  levalbuterol (XOPENEX) 0.63 MG/3ML nebulizer solution Take 3 mLs (0.63 mg total) by nebulization every 8 (eight) hours as needed for wheezing.  meclizine (ANTIVERT) 25 MG tablet Take 1 tablet (25 mg total) by mouth 2 (two) times daily as needed for dizziness. TAKE ONE TABLET 2-3 TIMES A DAY AS NEEDED FOR VERTIGO  Multiple Vitamin (MULTIVITAMIN WITH MINERALS) TABS tablet Take 1 tablet by mouth daily.  Nystatin ([place]) 100000 UNIT/GM POWD Apply topically.  predniSONE (DELTASONE) 10 MG tablet 4 tabs x 2 days, 3 tabs x 2 days, 2 tabs x 2 days, 1 tab x 2 days.  Respiratory Therapy Supplies (FLUTTER) DEVI Blow through 4 times per set, three sets daily  warfarin (COUMADIN) 2.5 MG tablet Take 1 tablet (2.5 mg total) by mouth daily.   Past Medical  History:  Diagnosis Date  . Arthritis   . Cataract   . Chronic respiratory failure ([place])   . Endometriosis   . HTN (hypertension)   . Hx of echocardiogram    Echo (8/15):  Mild LVH, EF 65%, no RWMA, Ao sclerosis, no AS, mod MAC, mild LAE, normal RVSF, PASP 40 mmHg  . Hx pulmonary embolism    on chronic coumadin  . Obesities, morbid ([place])   . OSA (obstructive sleep apnea)   . Osteoporosis   . PAF (paroxysmal atrial fibrillation) ([place])    Event Monitor (8/15):  Frequent PACs, brief bursts of nonsustained ATach; no AFib  . Psoriasis   . Skin cancer   . Vertigo    Social History    Socioeconomic History  . Marital status: Widowed    Spouse name: Not on file  . Number of children: Not on file  . Years of education: Not on file  . Highest education level: Not on file  Social Needs  . Financial resource strain: Not on file  . Food insecurity - worry: Not on file  . Food insecurity - inability: Not on file  . Transportation needs - medical: Not on file  . Transportation needs - non-medical: Not on file  Occupational History  . Not on file  Tobacco Use  . Smoking status: Former Smoker    Packs/day: 1.00    Years: 2.00    Pack years: 2.00    Types: Cigarettes    Last attempt to quit: 04/03/1981    Years since quitting: 35.9  . Smokeless tobacco: Never Used  Substance and Sexual Activity  . Alcohol use: No  . Drug use: No  . Sexual activity: Not Currently  Other Topics Concern  . Not on file  Social History Narrative   She lives with her sister in [place] and requires assistance at home. Has an aid who visits and helps at home often. Son works as a Catering manager and also visits and helps when he can. Semi-independent in ADLs, requires walker around home, dependent in IADLs.   Family History  Problem Relation Age of Onset  . Heart attack Father   . Heart attack Brother   . Stroke Neg Hx    ASSESSMENT Recent Results: Lab Results  Component Value Date   INR 1.7 02/26/2017   INR 1.8 02/19/2017   INR 2.2 02/12/2017   Anticoagulation Dosing: 2.5 mg daily   INR today: Subtherapeutic  PLAN Patient was advised to take 1.5 tablets (3.75 mg) Tuesdays, and 1 tablet (2.5 mg) all other days, and was provided education on consistent dietary intake. Patient verbalized understanding and states she also wants to try spacing the probiotic and warfarin by 2-4 hours.  Patient Instructions  Patient educated about medication as defined in this encounter and verbalized understanding by repeating back instructions provided.    Patient advised to contact clinic  or seek medical attention if signs/symptoms of bleeding or thromboembolism occur.  Patient verbalized understanding by repeating back information and was advised to contact me if further medication-related questions arise. Patient was also provided an information handout.  Follow-up Return in about 1 week (around 03/06/2017).  Jacqueline Nguyen

## 2017-02-28 NOTE — Patient Instructions (Signed)
Patient educated about medication as defined in this encounter and verbalized understanding by repeating back instructions provided.   

## 2017-03-01 DIAGNOSIS — I10 Essential (primary) hypertension: Secondary | ICD-10-CM | POA: Diagnosis not present

## 2017-03-01 DIAGNOSIS — R5383 Other fatigue: Secondary | ICD-10-CM | POA: Diagnosis not present

## 2017-03-01 DIAGNOSIS — R42 Dizziness and giddiness: Secondary | ICD-10-CM | POA: Diagnosis not present

## 2017-03-01 DIAGNOSIS — I279 Pulmonary heart disease, unspecified: Secondary | ICD-10-CM | POA: Diagnosis not present

## 2017-03-01 DIAGNOSIS — C44311 Basal cell carcinoma of skin of nose: Secondary | ICD-10-CM | POA: Diagnosis not present

## 2017-03-01 DIAGNOSIS — J9610 Chronic respiratory failure, unspecified whether with hypoxia or hypercapnia: Secondary | ICD-10-CM | POA: Diagnosis not present

## 2017-03-01 DIAGNOSIS — I480 Paroxysmal atrial fibrillation: Secondary | ICD-10-CM | POA: Diagnosis not present

## 2017-03-01 DIAGNOSIS — J449 Chronic obstructive pulmonary disease, unspecified: Secondary | ICD-10-CM | POA: Diagnosis not present

## 2017-03-01 DIAGNOSIS — G894 Chronic pain syndrome: Secondary | ICD-10-CM | POA: Diagnosis not present

## 2017-03-01 DIAGNOSIS — L989 Disorder of the skin and subcutaneous tissue, unspecified: Secondary | ICD-10-CM | POA: Diagnosis not present

## 2017-03-01 DIAGNOSIS — G4733 Obstructive sleep apnea (adult) (pediatric): Secondary | ICD-10-CM | POA: Diagnosis not present

## 2017-03-01 LAB — PROTIME-INR

## 2017-03-02 ENCOUNTER — Telehealth: Payer: Self-pay | Admitting: Internal Medicine

## 2017-03-02 NOTE — Telephone Encounter (Signed)
Called and spoke with [name].  Received report that her BP was elevated with PT and wanted to check in with her.   She reports that her BP is elevated overnight and when she wakes in the morning.  It improves with taking her anti-hypertensives.  She has a wide pulse pressure, so starting medications for her elevated systolic pressure has been difficult (she was told not to take amlodipine by her Cardiologist Dr. Recardo Evangelist).  She is getting up at night and checking her BP when she is asymptomatic.  Her father died suddenly in his sleep and I think this is her motivation to do that.  I advised her that she should only check her BP overnight if she awakens with a headache, worsening dizziness or chest pain.  I would like to see her on a low dose anti-hypertensive such as amlodipine, but will need to discuss with cardiology first.

## 2017-03-02 NOTE — Telephone Encounter (Signed)
Attempted to call patient to see how she is doing with her blood pressure.  No voicemail available.

## 2017-03-05 ENCOUNTER — Encounter: Payer: Self-pay | Admitting: Pharmacist

## 2017-03-05 DIAGNOSIS — J449 Chronic obstructive pulmonary disease, unspecified: Secondary | ICD-10-CM | POA: Diagnosis not present

## 2017-03-05 DIAGNOSIS — C44311 Basal cell carcinoma of skin of nose: Secondary | ICD-10-CM | POA: Diagnosis not present

## 2017-03-05 DIAGNOSIS — Z7901 Long term (current) use of anticoagulants: Secondary | ICD-10-CM

## 2017-03-05 DIAGNOSIS — G4733 Obstructive sleep apnea (adult) (pediatric): Secondary | ICD-10-CM | POA: Diagnosis not present

## 2017-03-05 DIAGNOSIS — R42 Dizziness and giddiness: Secondary | ICD-10-CM | POA: Diagnosis not present

## 2017-03-05 DIAGNOSIS — I279 Pulmonary heart disease, unspecified: Secondary | ICD-10-CM | POA: Diagnosis not present

## 2017-03-05 DIAGNOSIS — Z86711 Personal history of pulmonary embolism: Secondary | ICD-10-CM

## 2017-03-05 DIAGNOSIS — J9610 Chronic respiratory failure, unspecified whether with hypoxia or hypercapnia: Secondary | ICD-10-CM | POA: Diagnosis not present

## 2017-03-05 DIAGNOSIS — Z86718 Personal history of other venous thrombosis and embolism: Secondary | ICD-10-CM

## 2017-03-05 DIAGNOSIS — I10 Essential (primary) hypertension: Secondary | ICD-10-CM | POA: Diagnosis not present

## 2017-03-05 DIAGNOSIS — R5383 Other fatigue: Secondary | ICD-10-CM | POA: Diagnosis not present

## 2017-03-05 DIAGNOSIS — L989 Disorder of the skin and subcutaneous tissue, unspecified: Secondary | ICD-10-CM | POA: Diagnosis not present

## 2017-03-05 DIAGNOSIS — G894 Chronic pain syndrome: Secondary | ICD-10-CM | POA: Diagnosis not present

## 2017-03-05 DIAGNOSIS — I480 Paroxysmal atrial fibrillation: Secondary | ICD-10-CM | POA: Diagnosis not present

## 2017-03-06 DIAGNOSIS — G4733 Obstructive sleep apnea (adult) (pediatric): Secondary | ICD-10-CM | POA: Diagnosis not present

## 2017-03-06 LAB — PROTIME-INR

## 2017-03-06 LAB — POCT INR: INR: 2.1

## 2017-03-06 NOTE — Progress Notes (Signed)
Anticoagulation Management Jacqueline Nguyen a 83 y.o.femalewho is onwarfarintreatment.   Indication: DVT and PEhistory, paroxysmal atrial fibrillation Duration: indefinite Supervising physician: Gilles Chiquito  Patient reports no signs or symptoms of bleeding or thromboembolism.  Anticoagulation Episode Summary    Current INR goal:   2.0-3.0  TTR:   73.9 % (7.3 mo)  Next INR check:   03/12/2017  INR from last check:   2.1 (03/05/2017)  Weekly max warfarin dose:     Target end date:     INR check location:     Preferred lab:     Send INR reminders to:      Indications   History of DVT (deep vein thrombosis) [Z86.718] Anticoagulated on Coumadin [Z51.81 Z79.01] History of pulmonary embolism [Z86.711]       Comments:         Anticoagulation Care Providers    Provider Role Specialty Phone number   Jacqueline Falcon, MD Responsible Internal Medicine [phone]     Allergies  Allergen Reactions  . Ciprofloxacin   . Bactrim Rash  . Codeine Nausea Only   Medication Sig  acetic acid-hydrocortisone (VOSOL-HC) otic solution Place 3 drops into the right ear 3 (three) times daily. For seven days.  Ascorbic Acid (VITAMIN C PO) Take 1 capsule by mouth daily.  atenolol (TENORMIN) 25 MG tablet Take 1 tablet (25 mg total) by mouth 4 (four) times daily as needed. MAY TAKE 1 TABLET 4 TIMES DAILY AS NEEDED.  AZOPT 1 % ophthalmic suspension Place 1 drop into both eyes 2 (two) times daily.  budesonide-formoterol (SYMBICORT) 160-4.5 MCG/ACT inhaler Inhale 2 puffs into the lungs 2 (two) times daily. Rinse mouth after each use  Cholecalciferol (VITAMIN D PO) Take 1 capsule by mouth daily.  clotrimazole (LOTRIMIN) 1 % cream Apply 1 application 2 (two) times daily topically. Approximately 1 gram to the affected area.  COMBIGAN 0.2-0.5 % ophthalmic solution Place 1 drop into both eyes 2 (two) times daily.  cyanocobalamin 1000 MCG tablet Take 1,000 mcg by mouth daily.  diltiazem (CARDIZEM CD)  120 MG 24 hr capsule TAKE ONE (1) CAPSULE BY MOUTH 2 TIMES DAILY  FLOVENT HFA 44 MCG/ACT inhaler INHALE 1 PUFF TWICE A DAY DAILY  furosemide (LASIX) 20 MG tablet TAKE 1 TABLET (20 MG TOTAL) BY MOUTH DAILY.  HYDROcodone-acetaminophen (NORCO) 10-325 MG tablet Take 1 tablet by mouth every 6 (six) hours as needed for severe pain.  levalbuterol (XOPENEX) 0.63 MG/3ML nebulizer solution Take 3 mLs (0.63 mg total) by nebulization every 8 (eight) hours as needed for wheezing.  meclizine (ANTIVERT) 25 MG tablet Take 1 tablet (25 mg total) by mouth 2 (two) times daily as needed for dizziness. TAKE ONE TABLET 2-3 TIMES A DAY AS NEEDED FOR VERTIGO  Multiple Vitamin (MULTIVITAMIN WITH MINERALS) TABS tablet Take 1 tablet by mouth daily.  Nystatin ([place]) 100000 UNIT/GM POWD Apply topically.  predniSONE (DELTASONE) 10 MG tablet 4 tabs x 2 days, 3 tabs x 2 days, 2 tabs x 2 days, 1 tab x 2 days.  Respiratory Therapy Supplies (FLUTTER) DEVI Blow through 4 times per set, three sets daily  warfarin (COUMADIN) 2.5 MG tablet Take 1 tablet (2.5 mg total) by mouth daily.   Past Medical History:  Diagnosis Date  . Arthritis   . Cataract   . Chronic respiratory failure ([place])   . Endometriosis   . HTN (hypertension)   . Hx of echocardiogram    Echo (8/15):  Mild LVH, EF 65%, no RWMA, Ao sclerosis,  no AS, mod MAC, mild LAE, normal RVSF, PASP 40 mmHg  . Hx pulmonary embolism    on chronic coumadin  . Obesities, morbid ([place])   . OSA (obstructive sleep apnea)   . Osteoporosis   . PAF (paroxysmal atrial fibrillation) ([place])    Event Monitor (8/15):  Frequent PACs, brief bursts of nonsustained ATach; no AFib  . Psoriasis   . Skin cancer   . Vertigo    Social History   Socioeconomic History  . Marital status: Widowed    Spouse name: Not on file  . Number of children: Not on file  . Years of education: Not on file  . Highest education level: Not on file  Social Needs  . Financial resource strain: Not on file   . Food insecurity - worry: Not on file  . Food insecurity - inability: Not on file  . Transportation needs - medical: Not on file  . Transportation needs - non-medical: Not on file  Occupational History  . Not on file  Tobacco Use  . Smoking status: Former Smoker    Packs/day: 1.00    Years: 2.00    Pack years: 2.00    Types: Cigarettes    Last attempt to quit: 04/03/1981    Years since quitting: 35.9  . Smokeless tobacco: Never Used  Substance and Sexual Activity  . Alcohol use: No  . Drug use: No  . Sexual activity: Not Currently  Other Topics Concern  . Not on file  Social History Narrative   She lives with her sister in [place] and requires assistance at home. Has an aid who visits and helps at home often. Son works as a Catering manager and also visits and helps when he can. Semi-independent in ADLs, requires walker around home, dependent in IADLs.   Family History  Problem Relation Age of Onset  . Heart attack Father   . Heart attack Brother   . Stroke Neg Hx    ASSESSMENT Lab Results  Component Value Date   INR 2.1 03/05/2017   INR 1.7 02/26/2017   INR 1.8 02/19/2017   Anticoagulation Dosing: 2.5 mg daily   INR today: Therapeutic  PLAN Weekly dose was unchanged   There are no Patient Instructions on file for this visit. Patient advised to contact clinic or seek medical attention if signs/symptoms of bleeding or thromboembolism occur.  Patient verbalized understanding by repeating back information and was advised to contact me if further medication-related questions arise. Patient was also provided an information handout.  Follow-up Return in about 1 week (around 03/12/2017).  Jacqueline Nguyen

## 2017-03-06 NOTE — Patient Instructions (Signed)
Patient educated about medication as defined in this encounter and verbalized understanding by repeating back instructions provided.

## 2017-03-06 NOTE — Progress Notes (Signed)
I have reviewed Dr. Julianne Rice note and agree with plan.

## 2017-03-07 DIAGNOSIS — R42 Dizziness and giddiness: Secondary | ICD-10-CM | POA: Diagnosis not present

## 2017-03-07 DIAGNOSIS — G894 Chronic pain syndrome: Secondary | ICD-10-CM | POA: Diagnosis not present

## 2017-03-07 DIAGNOSIS — L989 Disorder of the skin and subcutaneous tissue, unspecified: Secondary | ICD-10-CM | POA: Diagnosis not present

## 2017-03-07 DIAGNOSIS — I279 Pulmonary heart disease, unspecified: Secondary | ICD-10-CM | POA: Diagnosis not present

## 2017-03-07 DIAGNOSIS — G4733 Obstructive sleep apnea (adult) (pediatric): Secondary | ICD-10-CM | POA: Diagnosis not present

## 2017-03-07 DIAGNOSIS — I10 Essential (primary) hypertension: Secondary | ICD-10-CM | POA: Diagnosis not present

## 2017-03-07 DIAGNOSIS — I480 Paroxysmal atrial fibrillation: Secondary | ICD-10-CM | POA: Diagnosis not present

## 2017-03-07 DIAGNOSIS — C44311 Basal cell carcinoma of skin of nose: Secondary | ICD-10-CM | POA: Diagnosis not present

## 2017-03-07 DIAGNOSIS — R5383 Other fatigue: Secondary | ICD-10-CM | POA: Diagnosis not present

## 2017-03-07 DIAGNOSIS — J9610 Chronic respiratory failure, unspecified whether with hypoxia or hypercapnia: Secondary | ICD-10-CM | POA: Diagnosis not present

## 2017-03-07 DIAGNOSIS — J449 Chronic obstructive pulmonary disease, unspecified: Secondary | ICD-10-CM | POA: Diagnosis not present

## 2017-03-07 LAB — PROTIME-INR

## 2017-03-09 DIAGNOSIS — I10 Essential (primary) hypertension: Secondary | ICD-10-CM | POA: Diagnosis not present

## 2017-03-09 DIAGNOSIS — R42 Dizziness and giddiness: Secondary | ICD-10-CM | POA: Diagnosis not present

## 2017-03-09 DIAGNOSIS — J9610 Chronic respiratory failure, unspecified whether with hypoxia or hypercapnia: Secondary | ICD-10-CM | POA: Diagnosis not present

## 2017-03-09 DIAGNOSIS — G4733 Obstructive sleep apnea (adult) (pediatric): Secondary | ICD-10-CM | POA: Diagnosis not present

## 2017-03-09 DIAGNOSIS — G894 Chronic pain syndrome: Secondary | ICD-10-CM | POA: Diagnosis not present

## 2017-03-09 DIAGNOSIS — L989 Disorder of the skin and subcutaneous tissue, unspecified: Secondary | ICD-10-CM | POA: Diagnosis not present

## 2017-03-09 DIAGNOSIS — J449 Chronic obstructive pulmonary disease, unspecified: Secondary | ICD-10-CM | POA: Diagnosis not present

## 2017-03-09 DIAGNOSIS — I279 Pulmonary heart disease, unspecified: Secondary | ICD-10-CM | POA: Diagnosis not present

## 2017-03-09 DIAGNOSIS — R5383 Other fatigue: Secondary | ICD-10-CM | POA: Diagnosis not present

## 2017-03-09 DIAGNOSIS — I480 Paroxysmal atrial fibrillation: Secondary | ICD-10-CM | POA: Diagnosis not present

## 2017-03-09 DIAGNOSIS — C44311 Basal cell carcinoma of skin of nose: Secondary | ICD-10-CM | POA: Diagnosis not present

## 2017-03-10 DIAGNOSIS — R5383 Other fatigue: Secondary | ICD-10-CM | POA: Diagnosis not present

## 2017-03-10 DIAGNOSIS — R42 Dizziness and giddiness: Secondary | ICD-10-CM | POA: Diagnosis not present

## 2017-03-10 DIAGNOSIS — J9610 Chronic respiratory failure, unspecified whether with hypoxia or hypercapnia: Secondary | ICD-10-CM | POA: Diagnosis not present

## 2017-03-10 DIAGNOSIS — I10 Essential (primary) hypertension: Secondary | ICD-10-CM | POA: Diagnosis not present

## 2017-03-10 DIAGNOSIS — I480 Paroxysmal atrial fibrillation: Secondary | ICD-10-CM | POA: Diagnosis not present

## 2017-03-10 DIAGNOSIS — L989 Disorder of the skin and subcutaneous tissue, unspecified: Secondary | ICD-10-CM | POA: Diagnosis not present

## 2017-03-10 DIAGNOSIS — G4733 Obstructive sleep apnea (adult) (pediatric): Secondary | ICD-10-CM | POA: Diagnosis not present

## 2017-03-10 DIAGNOSIS — G894 Chronic pain syndrome: Secondary | ICD-10-CM | POA: Diagnosis not present

## 2017-03-10 DIAGNOSIS — J449 Chronic obstructive pulmonary disease, unspecified: Secondary | ICD-10-CM | POA: Diagnosis not present

## 2017-03-10 DIAGNOSIS — C44311 Basal cell carcinoma of skin of nose: Secondary | ICD-10-CM | POA: Diagnosis not present

## 2017-03-10 DIAGNOSIS — I279 Pulmonary heart disease, unspecified: Secondary | ICD-10-CM | POA: Diagnosis not present

## 2017-03-12 ENCOUNTER — Encounter: Payer: Self-pay | Admitting: Pharmacist

## 2017-03-12 DIAGNOSIS — G4733 Obstructive sleep apnea (adult) (pediatric): Secondary | ICD-10-CM | POA: Diagnosis not present

## 2017-03-12 DIAGNOSIS — Z86711 Personal history of pulmonary embolism: Secondary | ICD-10-CM

## 2017-03-12 DIAGNOSIS — Z7901 Long term (current) use of anticoagulants: Secondary | ICD-10-CM

## 2017-03-12 DIAGNOSIS — Z86718 Personal history of other venous thrombosis and embolism: Secondary | ICD-10-CM

## 2017-03-12 DIAGNOSIS — I480 Paroxysmal atrial fibrillation: Secondary | ICD-10-CM | POA: Diagnosis not present

## 2017-03-12 LAB — POCT INR: INR: 3.3

## 2017-03-13 DIAGNOSIS — L989 Disorder of the skin and subcutaneous tissue, unspecified: Secondary | ICD-10-CM | POA: Diagnosis not present

## 2017-03-13 DIAGNOSIS — G894 Chronic pain syndrome: Secondary | ICD-10-CM | POA: Diagnosis not present

## 2017-03-13 DIAGNOSIS — R42 Dizziness and giddiness: Secondary | ICD-10-CM | POA: Diagnosis not present

## 2017-03-13 DIAGNOSIS — I10 Essential (primary) hypertension: Secondary | ICD-10-CM | POA: Diagnosis not present

## 2017-03-13 DIAGNOSIS — I279 Pulmonary heart disease, unspecified: Secondary | ICD-10-CM | POA: Diagnosis not present

## 2017-03-13 DIAGNOSIS — R5383 Other fatigue: Secondary | ICD-10-CM | POA: Diagnosis not present

## 2017-03-13 DIAGNOSIS — C44311 Basal cell carcinoma of skin of nose: Secondary | ICD-10-CM | POA: Diagnosis not present

## 2017-03-13 DIAGNOSIS — J9610 Chronic respiratory failure, unspecified whether with hypoxia or hypercapnia: Secondary | ICD-10-CM | POA: Diagnosis not present

## 2017-03-13 DIAGNOSIS — I480 Paroxysmal atrial fibrillation: Secondary | ICD-10-CM | POA: Diagnosis not present

## 2017-03-13 DIAGNOSIS — J449 Chronic obstructive pulmonary disease, unspecified: Secondary | ICD-10-CM | POA: Diagnosis not present

## 2017-03-13 DIAGNOSIS — G4733 Obstructive sleep apnea (adult) (pediatric): Secondary | ICD-10-CM | POA: Diagnosis not present

## 2017-03-13 NOTE — Patient Instructions (Signed)
Patient educated about medication as defined in this encounter and verbalized understanding by repeating back instructions provided.

## 2017-03-13 NOTE — Progress Notes (Signed)
Anticoagulation Management Jacqueline Nguyen a 83 y.o.femalewho is onwarfarintreatment.   Indication: DVT and PEhistory, paroxysmal atrial fibrillation Duration: indefinite Supervising physician: Gilles Chiquito  Patient reports no signs or symptoms of bleeding or thromboembolism.  Anticoagulation Episode Summary    Current INR goal:   2.0-3.0  TTR:   74.0 % (7.5 mo)  Next INR check:   03/19/2017  INR from last check:   3.3! (03/12/2017)  Weekly max warfarin dose:     Target end date:     INR check location:     Preferred lab:     Send INR reminders to:      Indications   History of DVT (deep vein thrombosis) [Z86.718] Anticoagulated on Coumadin [Z51.81 Z79.01] History of pulmonary embolism [Z86.711]       Comments:         Anticoagulation Care Providers    Provider Role Specialty Phone number   [name], MD Responsible Internal Medicine [phone]     Allergies  Allergen Reactions  . Ciprofloxacin   . Bactrim Rash  . Codeine Nausea Only   Medication Sig  acetic acid-hydrocortisone (VOSOL-HC) otic solution Place 3 drops into the right ear 3 (three) times daily. For seven days.  Ascorbic Acid (VITAMIN C PO) Take 1 capsule by mouth daily.  atenolol (TENORMIN) 25 MG tablet Take 1 tablet (25 mg total) by mouth 4 (four) times daily as needed. MAY TAKE 1 TABLET 4 TIMES DAILY AS NEEDED.  AZOPT 1 % ophthalmic suspension Place 1 drop into both eyes 2 (two) times daily.  budesonide-formoterol (SYMBICORT) 160-4.5 MCG/ACT inhaler Inhale 2 puffs into the lungs 2 (two) times daily. Rinse mouth after each use  Cholecalciferol (VITAMIN D PO) Take 1 capsule by mouth daily.  clotrimazole (LOTRIMIN) 1 % cream Apply 1 application 2 (two) times daily topically. Approximately 1 gram to the affected area.  COMBIGAN 0.2-0.5 % ophthalmic solution Place 1 drop into both eyes 2 (two) times daily.  cyanocobalamin 1000 MCG tablet Take 1,000 mcg by mouth daily.  diltiazem (CARDIZEM  CD) 120 MG 24 hr capsule TAKE ONE (1) CAPSULE BY MOUTH 2 TIMES DAILY  FLOVENT HFA 44 MCG/ACT inhaler INHALE 1 PUFF TWICE A DAY DAILY  furosemide (LASIX) 20 MG tablet TAKE 1 TABLET (20 MG TOTAL) BY MOUTH DAILY.  HYDROcodone-acetaminophen (NORCO) 10-325 MG tablet Take 1 tablet by mouth every 6 (six) hours as needed for severe pain.  levalbuterol (XOPENEX) 0.63 MG/3ML nebulizer solution Take 3 mLs (0.63 mg total) by nebulization every 8 (eight) hours as needed for wheezing.  meclizine (ANTIVERT) 25 MG tablet Take 1 tablet (25 mg total) by mouth 2 (two) times daily as needed for dizziness. TAKE ONE TABLET 2-3 TIMES A DAY AS NEEDED FOR VERTIGO  Multiple Vitamin (MULTIVITAMIN WITH MINERALS) TABS tablet Take 1 tablet by mouth daily.  Nystatin ([place]) 100000 UNIT/GM POWD Apply topically.  predniSONE (DELTASONE) 10 MG tablet 4 tabs x 2 days, 3 tabs x 2 days, 2 tabs x 2 days, 1 tab x 2 days.  Respiratory Therapy Supplies (FLUTTER) DEVI Blow through 4 times per set, three sets daily  warfarin (COUMADIN) 2.5 MG tablet Take 1 tablet (2.5 mg total) by mouth daily.   Past Medical History:  Diagnosis Date  . Arthritis   . Cataract   . Chronic respiratory failure ([place])   . Endometriosis   . HTN (hypertension)   . Hx of echocardiogram    Echo (8/15):  Mild LVH, EF 65%, no RWMA, Ao sclerosis,  no AS, mod MAC, mild LAE, normal RVSF, PASP 40 mmHg  . Hx pulmonary embolism    on chronic coumadin  . Obesities, morbid ([place])   . OSA (obstructive sleep apnea)   . Osteoporosis   . PAF (paroxysmal atrial fibrillation) ([place])    Event Monitor (8/15):  Frequent PACs, brief bursts of nonsustained ATach; no AFib  . Psoriasis   . Skin cancer   . Vertigo    Social History   Socioeconomic History  . Marital status: Widowed    Spouse name: Not on file  . Number of children: Not on file  . Years of education: Not on file  . Highest education level: Not on file  Social Needs  . Financial resource strain: Not on  file  . Food insecurity - worry: Not on file  . Food insecurity - inability: Not on file  . Transportation needs - medical: Not on file  . Transportation needs - non-medical: Not on file  Occupational History  . Not on file  Tobacco Use  . Smoking status: Former Smoker    Packs/day: 1.00    Years: 2.00    Pack years: 2.00    Types: Cigarettes    Last attempt to quit: 04/03/1981    Years since quitting: 35.9  . Smokeless tobacco: Never Used  Substance and Sexual Activity  . Alcohol use: No  . Drug use: No  . Sexual activity: Not Currently  Other Topics Concern  . Not on file  Social History Narrative   She lives with her sister in [place] and requires assistance at home. Has an aid who visits and helps at home often. Son works as a Catering manager and also visits and helps when he can. Semi-independent in ADLs, requires walker around home, dependent in IADLs.   Family History  Problem Relation Age of Onset  . Heart attack Father   . Heart attack Brother   . Stroke Neg Hx    ASSESSMENT Recent Results: Lab Results  Component Value Date   INR 3.3 03/12/2017   INR 2.1 03/05/2017   INR 1.7 02/26/2017   Anticoagulation Dosing: 2.5 mg daily   INR today: supratherapeutic  PLAN Will keep dose the same since < 0.5 above target INR x 1, re-check 1 week and plan to increase dose if still supratherapeutic.  There are no Patient Instructions on file for this visit. Patient advised to contact clinic or seek medical attention if signs/symptoms of bleeding or thromboembolism occur.  Patient verbalized understanding by repeating back information and was advised to contact me if further medication-related questions arise. Patient was also provided an information handout.  Follow-up Return in about 1 week (around 03/19/2017).  [name]

## 2017-03-14 ENCOUNTER — Telehealth: Payer: Self-pay | Admitting: Internal Medicine

## 2017-03-14 DIAGNOSIS — G4733 Obstructive sleep apnea (adult) (pediatric): Secondary | ICD-10-CM | POA: Diagnosis not present

## 2017-03-14 NOTE — Telephone Encounter (Signed)
Please call patient back about all of her medications.

## 2017-03-15 DIAGNOSIS — R42 Dizziness and giddiness: Secondary | ICD-10-CM | POA: Diagnosis not present

## 2017-03-15 DIAGNOSIS — R5383 Other fatigue: Secondary | ICD-10-CM | POA: Diagnosis not present

## 2017-03-15 DIAGNOSIS — G894 Chronic pain syndrome: Secondary | ICD-10-CM | POA: Diagnosis not present

## 2017-03-15 DIAGNOSIS — I279 Pulmonary heart disease, unspecified: Secondary | ICD-10-CM | POA: Diagnosis not present

## 2017-03-15 DIAGNOSIS — J9610 Chronic respiratory failure, unspecified whether with hypoxia or hypercapnia: Secondary | ICD-10-CM | POA: Diagnosis not present

## 2017-03-15 DIAGNOSIS — J449 Chronic obstructive pulmonary disease, unspecified: Secondary | ICD-10-CM | POA: Diagnosis not present

## 2017-03-15 DIAGNOSIS — I10 Essential (primary) hypertension: Secondary | ICD-10-CM | POA: Diagnosis not present

## 2017-03-15 DIAGNOSIS — C44311 Basal cell carcinoma of skin of nose: Secondary | ICD-10-CM | POA: Diagnosis not present

## 2017-03-15 DIAGNOSIS — G4733 Obstructive sleep apnea (adult) (pediatric): Secondary | ICD-10-CM | POA: Diagnosis not present

## 2017-03-15 DIAGNOSIS — I480 Paroxysmal atrial fibrillation: Secondary | ICD-10-CM | POA: Diagnosis not present

## 2017-03-15 DIAGNOSIS — L989 Disorder of the skin and subcutaneous tissue, unspecified: Secondary | ICD-10-CM | POA: Diagnosis not present

## 2017-03-15 LAB — PROTIME-INR

## 2017-03-15 NOTE — Progress Notes (Signed)
I reviewed Dr. Julianne Rice note and agree with plan.

## 2017-03-16 DIAGNOSIS — J9610 Chronic respiratory failure, unspecified whether with hypoxia or hypercapnia: Secondary | ICD-10-CM | POA: Diagnosis not present

## 2017-03-16 DIAGNOSIS — G894 Chronic pain syndrome: Secondary | ICD-10-CM | POA: Diagnosis not present

## 2017-03-16 DIAGNOSIS — R42 Dizziness and giddiness: Secondary | ICD-10-CM | POA: Diagnosis not present

## 2017-03-16 DIAGNOSIS — R5383 Other fatigue: Secondary | ICD-10-CM | POA: Diagnosis not present

## 2017-03-16 DIAGNOSIS — C44311 Basal cell carcinoma of skin of nose: Secondary | ICD-10-CM | POA: Diagnosis not present

## 2017-03-16 DIAGNOSIS — I279 Pulmonary heart disease, unspecified: Secondary | ICD-10-CM | POA: Diagnosis not present

## 2017-03-16 DIAGNOSIS — I480 Paroxysmal atrial fibrillation: Secondary | ICD-10-CM | POA: Diagnosis not present

## 2017-03-16 DIAGNOSIS — J449 Chronic obstructive pulmonary disease, unspecified: Secondary | ICD-10-CM | POA: Diagnosis not present

## 2017-03-16 DIAGNOSIS — I10 Essential (primary) hypertension: Secondary | ICD-10-CM | POA: Diagnosis not present

## 2017-03-16 DIAGNOSIS — G4733 Obstructive sleep apnea (adult) (pediatric): Secondary | ICD-10-CM | POA: Diagnosis not present

## 2017-03-16 DIAGNOSIS — L989 Disorder of the skin and subcutaneous tissue, unspecified: Secondary | ICD-10-CM | POA: Diagnosis not present

## 2017-03-16 NOTE — Telephone Encounter (Signed)
Would like Helen to call back.  

## 2017-03-19 ENCOUNTER — Encounter: Payer: Self-pay | Admitting: Pharmacist

## 2017-03-19 DIAGNOSIS — C44311 Basal cell carcinoma of skin of nose: Secondary | ICD-10-CM | POA: Diagnosis not present

## 2017-03-19 DIAGNOSIS — R5383 Other fatigue: Secondary | ICD-10-CM | POA: Diagnosis not present

## 2017-03-19 DIAGNOSIS — I10 Essential (primary) hypertension: Secondary | ICD-10-CM | POA: Diagnosis not present

## 2017-03-19 DIAGNOSIS — Z86711 Personal history of pulmonary embolism: Secondary | ICD-10-CM

## 2017-03-19 DIAGNOSIS — I480 Paroxysmal atrial fibrillation: Secondary | ICD-10-CM | POA: Diagnosis not present

## 2017-03-19 DIAGNOSIS — Z7901 Long term (current) use of anticoagulants: Secondary | ICD-10-CM

## 2017-03-19 DIAGNOSIS — G894 Chronic pain syndrome: Secondary | ICD-10-CM | POA: Diagnosis not present

## 2017-03-19 DIAGNOSIS — I82402 Acute embolism and thrombosis of unspecified deep veins of left lower extremity: Secondary | ICD-10-CM

## 2017-03-19 DIAGNOSIS — R42 Dizziness and giddiness: Secondary | ICD-10-CM | POA: Diagnosis not present

## 2017-03-19 DIAGNOSIS — I279 Pulmonary heart disease, unspecified: Secondary | ICD-10-CM | POA: Diagnosis not present

## 2017-03-19 DIAGNOSIS — L989 Disorder of the skin and subcutaneous tissue, unspecified: Secondary | ICD-10-CM | POA: Diagnosis not present

## 2017-03-19 DIAGNOSIS — J9610 Chronic respiratory failure, unspecified whether with hypoxia or hypercapnia: Secondary | ICD-10-CM | POA: Diagnosis not present

## 2017-03-19 DIAGNOSIS — J449 Chronic obstructive pulmonary disease, unspecified: Secondary | ICD-10-CM | POA: Diagnosis not present

## 2017-03-19 DIAGNOSIS — Z86718 Personal history of other venous thrombosis and embolism: Secondary | ICD-10-CM

## 2017-03-19 DIAGNOSIS — G4733 Obstructive sleep apnea (adult) (pediatric): Secondary | ICD-10-CM | POA: Diagnosis not present

## 2017-03-19 LAB — POCT INR: INR: 1.8

## 2017-03-19 MED ORDER — WARFARIN SODIUM 2.5 MG PO TABS: 2.5000 mg | ORAL_TABLET | Freq: Every day | ORAL | 3 refills | Status: DC

## 2017-03-19 NOTE — Progress Notes (Signed)
Anticoagulation Management Jacqueline Nguyen a 83 y.o.femalewho is onwarfarintreatment.   Indication: DVT and PEhistory, paroxysmal atrial fibrillation Duration: indefinite Supervising physician: Gilles Chiquito  Patient reports no signs or symptoms of bleeding or thromboembolism. Anticoagulation Episode Summary    Current INR goal:   2.0-3.0  TTR:   73.7 % (7.8 mo)  Next INR check:   03/26/2017  INR from last check:   1.8! (03/19/2017)  Weekly max warfarin dose:     Target end date:     INR check location:     Preferred lab:     Send INR reminders to:      Indications   History of DVT (deep vein thrombosis) [Z86.718] Anticoagulated on Coumadin [Z51.81 Z79.01] History of pulmonary embolism [Z86.711]       Comments:         Anticoagulation Care Providers    Provider Role Specialty Phone number   [name], MD Responsible Internal Medicine [phone]     Allergies  Allergen Reactions  . Ciprofloxacin   . Bactrim Rash  . Codeine Nausea Only   Medication Sig  acetic acid-hydrocortisone (VOSOL-HC) otic solution Place 3 drops into the right ear 3 (three) times daily. For seven days.  Ascorbic Acid (VITAMIN C PO) Take 1 capsule by mouth daily.  atenolol (TENORMIN) 25 MG tablet Take 1 tablet (25 mg total) by mouth 4 (four) times daily as needed. MAY TAKE 1 TABLET 4 TIMES DAILY AS NEEDED.  AZOPT 1 % ophthalmic suspension Place 1 drop into both eyes 2 (two) times daily.  budesonide-formoterol (SYMBICORT) 160-4.5 MCG/ACT inhaler Inhale 2 puffs into the lungs 2 (two) times daily. Rinse mouth after each use  Cholecalciferol (VITAMIN D PO) Take 1 capsule by mouth daily.  clotrimazole (LOTRIMIN) 1 % cream Apply 1 application 2 (two) times daily topically. Approximately 1 gram to the affected area.  COMBIGAN 0.2-0.5 % ophthalmic solution Place 1 drop into both eyes 2 (two) times daily.  cyanocobalamin 1000 MCG tablet Take 1,000 mcg by mouth daily.  diltiazem (CARDIZEM CD)  120 MG 24 hr capsule TAKE ONE (1) CAPSULE BY MOUTH 2 TIMES DAILY  FLOVENT HFA 44 MCG/ACT inhaler INHALE 1 PUFF TWICE A DAY DAILY  furosemide (LASIX) 20 MG tablet TAKE 1 TABLET (20 MG TOTAL) BY MOUTH DAILY.  HYDROcodone-acetaminophen (NORCO) 10-325 MG tablet Take 1 tablet by mouth every 6 (six) hours as needed for severe pain.  levalbuterol (XOPENEX) 0.63 MG/3ML nebulizer solution Take 3 mLs (0.63 mg total) by nebulization every 8 (eight) hours as needed for wheezing.  meclizine (ANTIVERT) 25 MG tablet Take 1 tablet (25 mg total) by mouth 2 (two) times daily as needed for dizziness. TAKE ONE TABLET 2-3 TIMES A DAY AS NEEDED FOR VERTIGO  Multiple Vitamin (MULTIVITAMIN WITH MINERALS) TABS tablet Take 1 tablet by mouth daily.  Nystatin ([place]) 100000 UNIT/GM POWD Apply topically.  predniSONE (DELTASONE) 10 MG tablet 4 tabs x 2 days, 3 tabs x 2 days, 2 tabs x 2 days, 1 tab x 2 days.  Respiratory Therapy Supplies (FLUTTER) DEVI Blow through 4 times per set, three sets daily  warfarin (COUMADIN) 2.5 MG tablet Take 1 tablet (2.5 mg total) by mouth daily.   Past Medical History:  Diagnosis Date  . Arthritis   . Cataract   . Chronic respiratory failure ([place])   . Endometriosis   . HTN (hypertension)   . Hx of echocardiogram    Echo (8/15):  Mild LVH, EF 65%, no RWMA, Ao sclerosis, no  AS, mod MAC, mild LAE, normal RVSF, PASP 40 mmHg  . Hx pulmonary embolism    on chronic coumadin  . Obesities, morbid ([place])   . OSA (obstructive sleep apnea)   . Osteoporosis   . PAF (paroxysmal atrial fibrillation) ([place])    Event Monitor (8/15):  Frequent PACs, brief bursts of nonsustained ATach; no AFib  . Psoriasis   . Skin cancer   . Vertigo    Social History   Socioeconomic History  . Marital status: Widowed    Spouse name: Not on file  . Number of children: Not on file  . Years of education: Not on file  . Highest education level: Not on file  Social Needs  . Financial resource strain: Not on file   . Food insecurity - worry: Not on file  . Food insecurity - inability: Not on file  . Transportation needs - medical: Not on file  . Transportation needs - non-medical: Not on file  Occupational History  . Not on file  Tobacco Use  . Smoking status: Former Smoker    Packs/day: 1.00    Years: 2.00    Pack years: 2.00    Types: Cigarettes    Last attempt to quit: 04/03/1981    Years since quitting: 35.9  . Smokeless tobacco: Never Used  Substance and Sexual Activity  . Alcohol use: No  . Drug use: No  . Sexual activity: Not Currently  Other Topics Concern  . Not on file  Social History Narrative   She lives with her sister in [place] and requires assistance at home. Has an aid who visits and helps at home often. Son works as a Catering manager and also visits and helps when he can. Semi-independent in ADLs, requires walker around home, dependent in IADLs.   Family History  Problem Relation Age of Onset  . Heart attack Father   . Heart attack Brother   . Stroke Neg Hx    ASSESSMENT Recent Results: Lab Results  Component Value Date   INR 1.8 03/19/2017   INR 3.3 03/12/2017   INR 2.1 03/05/2017   Anticoagulation Dosing: 2.5 mg daily   INR today: Subtherapeutic  PLAN Take 1/2 tablet x 1 dose, then resume 2.5 mg daily  Patient Instructions  Patient educated about medication as defined in this encounter and verbalized understanding by repeating back instructions provided.   Patient advised to contact clinic or seek medical attention if signs/symptoms of bleeding or thromboembolism occur.  Patient verbalized understanding by repeating back information and was advised to contact me if further medication-related questions arise. Patient was also provided an information handout.  Follow-up Return in about 1 week (around 03/26/2017).  [name]

## 2017-03-19 NOTE — Patient Instructions (Signed)
Patient educated about medication as defined in this encounter and verbalized understanding by repeating back instructions provided.

## 2017-03-21 DIAGNOSIS — J449 Chronic obstructive pulmonary disease, unspecified: Secondary | ICD-10-CM | POA: Diagnosis not present

## 2017-03-22 ENCOUNTER — Other Ambulatory Visit: Payer: Self-pay | Admitting: Internal Medicine

## 2017-03-22 DIAGNOSIS — J449 Chronic obstructive pulmonary disease, unspecified: Secondary | ICD-10-CM | POA: Diagnosis not present

## 2017-03-22 DIAGNOSIS — I10 Essential (primary) hypertension: Secondary | ICD-10-CM | POA: Diagnosis not present

## 2017-03-22 DIAGNOSIS — R5383 Other fatigue: Secondary | ICD-10-CM | POA: Diagnosis not present

## 2017-03-22 DIAGNOSIS — L989 Disorder of the skin and subcutaneous tissue, unspecified: Secondary | ICD-10-CM | POA: Diagnosis not present

## 2017-03-22 DIAGNOSIS — G4733 Obstructive sleep apnea (adult) (pediatric): Secondary | ICD-10-CM | POA: Diagnosis not present

## 2017-03-22 DIAGNOSIS — I279 Pulmonary heart disease, unspecified: Secondary | ICD-10-CM | POA: Diagnosis not present

## 2017-03-22 DIAGNOSIS — J9610 Chronic respiratory failure, unspecified whether with hypoxia or hypercapnia: Secondary | ICD-10-CM | POA: Diagnosis not present

## 2017-03-22 DIAGNOSIS — G894 Chronic pain syndrome: Secondary | ICD-10-CM | POA: Diagnosis not present

## 2017-03-22 DIAGNOSIS — R42 Dizziness and giddiness: Secondary | ICD-10-CM | POA: Diagnosis not present

## 2017-03-22 DIAGNOSIS — C44311 Basal cell carcinoma of skin of nose: Secondary | ICD-10-CM | POA: Diagnosis not present

## 2017-03-22 DIAGNOSIS — I480 Paroxysmal atrial fibrillation: Secondary | ICD-10-CM | POA: Diagnosis not present

## 2017-03-22 NOTE — Telephone Encounter (Signed)
Patient is requesting Bonnita Nasuti, pls call back

## 2017-03-23 DIAGNOSIS — I10 Essential (primary) hypertension: Secondary | ICD-10-CM | POA: Diagnosis not present

## 2017-03-23 DIAGNOSIS — G894 Chronic pain syndrome: Secondary | ICD-10-CM | POA: Diagnosis not present

## 2017-03-23 DIAGNOSIS — I480 Paroxysmal atrial fibrillation: Secondary | ICD-10-CM | POA: Diagnosis not present

## 2017-03-23 DIAGNOSIS — C44311 Basal cell carcinoma of skin of nose: Secondary | ICD-10-CM | POA: Diagnosis not present

## 2017-03-23 DIAGNOSIS — L989 Disorder of the skin and subcutaneous tissue, unspecified: Secondary | ICD-10-CM | POA: Diagnosis not present

## 2017-03-23 DIAGNOSIS — R42 Dizziness and giddiness: Secondary | ICD-10-CM | POA: Diagnosis not present

## 2017-03-23 DIAGNOSIS — J449 Chronic obstructive pulmonary disease, unspecified: Secondary | ICD-10-CM | POA: Diagnosis not present

## 2017-03-23 DIAGNOSIS — R5383 Other fatigue: Secondary | ICD-10-CM | POA: Diagnosis not present

## 2017-03-23 DIAGNOSIS — I279 Pulmonary heart disease, unspecified: Secondary | ICD-10-CM | POA: Diagnosis not present

## 2017-03-23 DIAGNOSIS — J9610 Chronic respiratory failure, unspecified whether with hypoxia or hypercapnia: Secondary | ICD-10-CM | POA: Diagnosis not present

## 2017-03-23 DIAGNOSIS — G4733 Obstructive sleep apnea (adult) (pediatric): Secondary | ICD-10-CM | POA: Diagnosis not present

## 2017-03-26 LAB — POCT INR: INR: 2.1

## 2017-03-28 DIAGNOSIS — L989 Disorder of the skin and subcutaneous tissue, unspecified: Secondary | ICD-10-CM | POA: Diagnosis not present

## 2017-03-28 DIAGNOSIS — I480 Paroxysmal atrial fibrillation: Secondary | ICD-10-CM | POA: Diagnosis not present

## 2017-03-28 DIAGNOSIS — G894 Chronic pain syndrome: Secondary | ICD-10-CM | POA: Diagnosis not present

## 2017-03-28 DIAGNOSIS — R42 Dizziness and giddiness: Secondary | ICD-10-CM | POA: Diagnosis not present

## 2017-03-28 DIAGNOSIS — J449 Chronic obstructive pulmonary disease, unspecified: Secondary | ICD-10-CM | POA: Diagnosis not present

## 2017-03-28 DIAGNOSIS — I279 Pulmonary heart disease, unspecified: Secondary | ICD-10-CM | POA: Diagnosis not present

## 2017-03-28 DIAGNOSIS — R5383 Other fatigue: Secondary | ICD-10-CM | POA: Diagnosis not present

## 2017-03-28 DIAGNOSIS — C44311 Basal cell carcinoma of skin of nose: Secondary | ICD-10-CM | POA: Diagnosis not present

## 2017-03-28 DIAGNOSIS — J9610 Chronic respiratory failure, unspecified whether with hypoxia or hypercapnia: Secondary | ICD-10-CM | POA: Diagnosis not present

## 2017-03-28 DIAGNOSIS — G4733 Obstructive sleep apnea (adult) (pediatric): Secondary | ICD-10-CM | POA: Diagnosis not present

## 2017-03-28 DIAGNOSIS — I10 Essential (primary) hypertension: Secondary | ICD-10-CM | POA: Diagnosis not present

## 2017-03-29 DIAGNOSIS — J9610 Chronic respiratory failure, unspecified whether with hypoxia or hypercapnia: Secondary | ICD-10-CM | POA: Diagnosis not present

## 2017-03-29 DIAGNOSIS — R5383 Other fatigue: Secondary | ICD-10-CM | POA: Diagnosis not present

## 2017-03-29 DIAGNOSIS — R42 Dizziness and giddiness: Secondary | ICD-10-CM | POA: Diagnosis not present

## 2017-03-29 DIAGNOSIS — G894 Chronic pain syndrome: Secondary | ICD-10-CM | POA: Diagnosis not present

## 2017-03-29 DIAGNOSIS — I480 Paroxysmal atrial fibrillation: Secondary | ICD-10-CM | POA: Diagnosis not present

## 2017-03-29 DIAGNOSIS — L989 Disorder of the skin and subcutaneous tissue, unspecified: Secondary | ICD-10-CM | POA: Diagnosis not present

## 2017-03-29 DIAGNOSIS — I279 Pulmonary heart disease, unspecified: Secondary | ICD-10-CM | POA: Diagnosis not present

## 2017-03-29 DIAGNOSIS — G4733 Obstructive sleep apnea (adult) (pediatric): Secondary | ICD-10-CM | POA: Diagnosis not present

## 2017-03-29 DIAGNOSIS — I10 Essential (primary) hypertension: Secondary | ICD-10-CM | POA: Diagnosis not present

## 2017-03-29 DIAGNOSIS — J449 Chronic obstructive pulmonary disease, unspecified: Secondary | ICD-10-CM | POA: Diagnosis not present

## 2017-03-29 DIAGNOSIS — C44311 Basal cell carcinoma of skin of nose: Secondary | ICD-10-CM | POA: Diagnosis not present

## 2017-03-29 NOTE — Telephone Encounter (Signed)
Pt is calling back about pain med. Please call pt back. Per patient she have not heard back from the clinic since she last called on 03/22/2017.

## 2017-03-29 NOTE — Telephone Encounter (Signed)
HYDROcodone-acetaminophen (NORCO) 10-325 MG tablet, refill request. Please call pt back.

## 2017-03-29 NOTE — Telephone Encounter (Signed)
Last rx written 12/06/16. Saw Dr Berline Lopes  11/14. Appt with Dr Daryll Drown 04/18/17. UDS 03/23/16.

## 2017-03-30 ENCOUNTER — Other Ambulatory Visit: Payer: Self-pay | Admitting: Internal Medicine

## 2017-03-30 ENCOUNTER — Other Ambulatory Visit: Payer: Self-pay

## 2017-03-30 ENCOUNTER — Other Ambulatory Visit (INDEPENDENT_AMBULATORY_CARE_PROVIDER_SITE_OTHER): Payer: Medicare Other | Admitting: Pharmacist

## 2017-03-30 DIAGNOSIS — R5383 Other fatigue: Secondary | ICD-10-CM | POA: Diagnosis not present

## 2017-03-30 DIAGNOSIS — R42 Dizziness and giddiness: Secondary | ICD-10-CM | POA: Diagnosis not present

## 2017-03-30 DIAGNOSIS — G4733 Obstructive sleep apnea (adult) (pediatric): Secondary | ICD-10-CM | POA: Diagnosis not present

## 2017-03-30 DIAGNOSIS — L989 Disorder of the skin and subcutaneous tissue, unspecified: Secondary | ICD-10-CM | POA: Diagnosis not present

## 2017-03-30 DIAGNOSIS — I480 Paroxysmal atrial fibrillation: Secondary | ICD-10-CM

## 2017-03-30 DIAGNOSIS — Z86718 Personal history of other venous thrombosis and embolism: Secondary | ICD-10-CM

## 2017-03-30 DIAGNOSIS — I10 Essential (primary) hypertension: Secondary | ICD-10-CM | POA: Diagnosis not present

## 2017-03-30 DIAGNOSIS — I279 Pulmonary heart disease, unspecified: Secondary | ICD-10-CM | POA: Diagnosis not present

## 2017-03-30 DIAGNOSIS — J9610 Chronic respiratory failure, unspecified whether with hypoxia or hypercapnia: Secondary | ICD-10-CM | POA: Diagnosis not present

## 2017-03-30 DIAGNOSIS — J449 Chronic obstructive pulmonary disease, unspecified: Secondary | ICD-10-CM | POA: Diagnosis not present

## 2017-03-30 DIAGNOSIS — G894 Chronic pain syndrome: Secondary | ICD-10-CM | POA: Diagnosis not present

## 2017-03-30 DIAGNOSIS — Z86711 Personal history of pulmonary embolism: Secondary | ICD-10-CM | POA: Diagnosis not present

## 2017-03-30 DIAGNOSIS — C44311 Basal cell carcinoma of skin of nose: Secondary | ICD-10-CM | POA: Diagnosis not present

## 2017-03-30 DIAGNOSIS — Z7901 Long term (current) use of anticoagulants: Secondary | ICD-10-CM

## 2017-03-30 MED ORDER — HYDROCODONE-ACETAMINOPHEN 10-325 MG PO TABS: 1.0000 | ORAL_TABLET | Freq: Four times a day (QID) | ORAL | 0 refills | Status: DC | PRN

## 2017-03-30 NOTE — Telephone Encounter (Signed)
Refilled for 3 months, sent electronically.

## 2017-03-30 NOTE — Telephone Encounter (Signed)
Jacqueline Nguyen stated pt had called [facility].He informed her since this is a controlled substance, will need new rx sent to them I had explained this to her earlier. He talked to pt's son, he will be able to pick up rx from [place]. Next time[facility] [place]

## 2017-03-30 NOTE — Progress Notes (Signed)
Anticoagulation Management Jacqueline Nguyen a 83 y.o.femalewho is onwarfarintreatment.   Indication: DVT and PEhistory, paroxysmal atrial fibrillation Duration: indefinite Supervising physician: Gilles Chiquito  Patient reports no signs or symptoms of bleeding or thromboembolism.  Anticoagulation Episode Summary    Current INR goal:   2.0-3.0  TTR:   72.6 % (8 mo)  Next INR check:   04/02/2017  INR from last check:   2.1 (03/26/2017)  Weekly max warfarin dose:     Target end date:     INR check location:     Preferred lab:     Send INR reminders to:      Indications   History of DVT (deep vein thrombosis) [Z86.718] Anticoagulated on Coumadin [Z51.81 Z79.01] History of pulmonary embolism [Z86.711]       Comments:         Anticoagulation Care Providers    Provider Role Specialty Phone number   [name], MD Responsible Internal Medicine [phone]     Allergies  Allergen Reactions  . Ciprofloxacin   . Bactrim Rash  . Codeine Nausea Only   Medication Sig  acetic acid-hydrocortisone (VOSOL-HC) otic solution Place 3 drops into the right ear 3 (three) times daily. For seven days.  Ascorbic Acid (VITAMIN C PO) Take 1 capsule by mouth daily.  atenolol (TENORMIN) 25 MG tablet Take 1 tablet (25 mg total) by mouth 4 (four) times daily as needed. MAY TAKE 1 TABLET 4 TIMES DAILY AS NEEDED.  AZOPT 1 % ophthalmic suspension Place 1 drop into both eyes 2 (two) times daily.  budesonide-formoterol (SYMBICORT) 160-4.5 MCG/ACT inhaler Inhale 2 puffs into the lungs 2 (two) times daily. Rinse mouth after each use  Cholecalciferol (VITAMIN D PO) Take 1 capsule by mouth daily.  clotrimazole (LOTRIMIN) 1 % cream Apply 1 application 2 (two) times daily topically. Approximately 1 gram to the affected area.  COMBIGAN 0.2-0.5 % ophthalmic solution Place 1 drop into both eyes 2 (two) times daily.  cyanocobalamin 1000 MCG tablet Take 1,000 mcg by mouth daily.  diltiazem (CARDIZEM CD)  120 MG 24 hr capsule TAKE ONE (1) CAPSULE BY MOUTH 2 TIMES DAILY  FLOVENT HFA 44 MCG/ACT inhaler INHALE 1 PUFF TWICE A DAY DAILY  furosemide (LASIX) 20 MG tablet TAKE 1 TABLET (20 MG TOTAL) BY MOUTH DAILY.  HYDROcodone-acetaminophen (NORCO) 10-325 MG tablet Take 1 tablet by mouth every 6 (six) hours as needed for severe pain.  levalbuterol (XOPENEX) 0.63 MG/3ML nebulizer solution Take 3 mLs (0.63 mg total) by nebulization every 8 (eight) hours as needed for wheezing.  meclizine (ANTIVERT) 25 MG tablet Take 1 tablet (25 mg total) by mouth 2 (two) times daily as needed for dizziness. TAKE ONE TABLET 2-3 TIMES A DAY AS NEEDED FOR VERTIGO  Multiple Vitamin (MULTIVITAMIN WITH MINERALS) TABS tablet Take 1 tablet by mouth daily.  Nystatin ([place]) 100000 UNIT/GM POWD Apply topically.  predniSONE (DELTASONE) 10 MG tablet 4 tabs x 2 days, 3 tabs x 2 days, 2 tabs x 2 days, 1 tab x 2 days.  Respiratory Therapy Supplies (FLUTTER) DEVI Blow through 4 times per set, three sets daily  warfarin (COUMADIN) 2.5 MG tablet Take 1 tablet (2.5 mg total) by mouth daily for 1 dose.   Past Medical History:  Diagnosis Date  . Arthritis   . Cataract   . Chronic respiratory failure ([place])   . Endometriosis   . HTN (hypertension)   . Hx of echocardiogram    Echo (8/15):  Mild LVH, EF 65%, no  RWMA, Ao sclerosis, no AS, mod MAC, mild LAE, normal RVSF, PASP 40 mmHg  . Hx pulmonary embolism    on chronic coumadin  . Obesities, morbid ([place])   . OSA (obstructive sleep apnea)   . Osteoporosis   . PAF (paroxysmal atrial fibrillation) ([place])    Event Monitor (8/15):  Frequent PACs, brief bursts of nonsustained ATach; no AFib  . Psoriasis   . Skin cancer   . Vertigo    Social History   Socioeconomic History  . Marital status: Widowed    Spouse name: Not on file  . Number of children: Not on file  . Years of education: Not on file  . Highest education level: Not on file  Social Needs  . Financial resource strain: Not  on file  . Food insecurity - worry: Not on file  . Food insecurity - inability: Not on file  . Transportation needs - medical: Not on file  . Transportation needs - non-medical: Not on file  Occupational History  . Not on file  Tobacco Use  . Smoking status: Former Smoker    Packs/day: 1.00    Years: 2.00    Pack years: 2.00    Types: Cigarettes    Last attempt to quit: 04/03/1981    Years since quitting: 36.0  . Smokeless tobacco: Never Used  Substance and Sexual Activity  . Alcohol use: No  . Drug use: No  . Sexual activity: Not Currently  Other Topics Concern  . Not on file  Social History Narrative   She lives with her sister in [place] and requires assistance at home. Has an aid who visits and helps at home often. Son works as a Catering manager and also visits and helps when he can. Semi-independent in ADLs, requires walker around home, dependent in IADLs.   Family History  Problem Relation Age of Onset  . Heart attack Father   . Heart attack Brother   . Stroke Neg Hx    ASSESSMENT Recent Results: Lab Results  Component Value Date   INR 2.1 03/26/2017   INR 1.8 03/19/2017   INR 3.3 03/12/2017   Anticoagulation Dosing: 2.5 mg daily   INR today: therapeutic  PLAN Weekly dose was deleted  Patient Instructions  Patient was educated about medication as defined in this encounter and verbalized understanding by repeating back instructions provided.    Patient advised to contact clinic or seek medical attention if signs/symptoms of bleeding or thromboembolism occur.  Patient verbalized understanding by repeating back information and was advised to contact me if further medication-related questions arise. Patient was also provided an information handout.  Follow-up Return in about 10 days (around 04/09/2017).  [name]

## 2017-03-30 NOTE — Telephone Encounter (Signed)
Would like to know if   HYDROcodone-acetaminophen (NORCO) 10-325 MG tablet   is ready for pick up. Please call pt back.

## 2017-03-30 NOTE — Telephone Encounter (Signed)
Called pt of Hydrocodone refill, sent to [facility]. [facility] - but stated she will have her son pick up rx at [place] but if she has any problems she will call back.

## 2017-03-30 NOTE — Telephone Encounter (Signed)
[facility] needs to speak with a nurse about   HYDROcodone-acetaminophen ([place]) 10-325 MG tablet   Please call back.

## 2017-03-30 NOTE — Patient Instructions (Signed)
Patient was educated about medication as defined in this encounter and verbalized understanding by repeating back instructions provided.

## 2017-04-02 LAB — POCT INR: INR: 1.9

## 2017-04-04 ENCOUNTER — Telehealth: Payer: Self-pay | Admitting: *Deleted

## 2017-04-04 ENCOUNTER — Other Ambulatory Visit: Payer: Self-pay

## 2017-04-04 DIAGNOSIS — J9610 Chronic respiratory failure, unspecified whether with hypoxia or hypercapnia: Secondary | ICD-10-CM | POA: Diagnosis not present

## 2017-04-04 DIAGNOSIS — L989 Disorder of the skin and subcutaneous tissue, unspecified: Secondary | ICD-10-CM | POA: Diagnosis not present

## 2017-04-04 DIAGNOSIS — I10 Essential (primary) hypertension: Secondary | ICD-10-CM | POA: Diagnosis not present

## 2017-04-04 DIAGNOSIS — I480 Paroxysmal atrial fibrillation: Secondary | ICD-10-CM

## 2017-04-04 DIAGNOSIS — I82402 Acute embolism and thrombosis of unspecified deep veins of left lower extremity: Secondary | ICD-10-CM

## 2017-04-04 DIAGNOSIS — R5383 Other fatigue: Secondary | ICD-10-CM | POA: Diagnosis not present

## 2017-04-04 DIAGNOSIS — I279 Pulmonary heart disease, unspecified: Secondary | ICD-10-CM | POA: Diagnosis not present

## 2017-04-04 DIAGNOSIS — C44311 Basal cell carcinoma of skin of nose: Secondary | ICD-10-CM | POA: Diagnosis not present

## 2017-04-04 DIAGNOSIS — G894 Chronic pain syndrome: Secondary | ICD-10-CM | POA: Diagnosis not present

## 2017-04-04 DIAGNOSIS — G4733 Obstructive sleep apnea (adult) (pediatric): Secondary | ICD-10-CM | POA: Diagnosis not present

## 2017-04-04 DIAGNOSIS — R42 Dizziness and giddiness: Secondary | ICD-10-CM | POA: Diagnosis not present

## 2017-04-04 DIAGNOSIS — J449 Chronic obstructive pulmonary disease, unspecified: Secondary | ICD-10-CM | POA: Diagnosis not present

## 2017-04-04 NOTE — Telephone Encounter (Signed)
warfarin (COUMADIN) 2.5 MG tablet(Expired), [facility] [place] [place].

## 2017-04-04 NOTE — Telephone Encounter (Signed)
INR 1.9 (collected 04/02/17) appt w/ pcp 04/18/17

## 2017-04-04 NOTE — Progress Notes (Signed)
I reviewed Dr. Julianne Rice note and agree with plan.

## 2017-04-05 ENCOUNTER — Encounter: Payer: Self-pay | Admitting: Pharmacist

## 2017-04-05 DIAGNOSIS — Z7901 Long term (current) use of anticoagulants: Secondary | ICD-10-CM

## 2017-04-05 DIAGNOSIS — I279 Pulmonary heart disease, unspecified: Secondary | ICD-10-CM | POA: Diagnosis not present

## 2017-04-05 DIAGNOSIS — J449 Chronic obstructive pulmonary disease, unspecified: Secondary | ICD-10-CM | POA: Diagnosis not present

## 2017-04-05 DIAGNOSIS — G4733 Obstructive sleep apnea (adult) (pediatric): Secondary | ICD-10-CM | POA: Diagnosis not present

## 2017-04-05 DIAGNOSIS — L989 Disorder of the skin and subcutaneous tissue, unspecified: Secondary | ICD-10-CM | POA: Diagnosis not present

## 2017-04-05 DIAGNOSIS — Z86711 Personal history of pulmonary embolism: Secondary | ICD-10-CM

## 2017-04-05 DIAGNOSIS — G894 Chronic pain syndrome: Secondary | ICD-10-CM | POA: Diagnosis not present

## 2017-04-05 DIAGNOSIS — Z86718 Personal history of other venous thrombosis and embolism: Secondary | ICD-10-CM

## 2017-04-05 DIAGNOSIS — R42 Dizziness and giddiness: Secondary | ICD-10-CM | POA: Diagnosis not present

## 2017-04-05 DIAGNOSIS — C44311 Basal cell carcinoma of skin of nose: Secondary | ICD-10-CM | POA: Diagnosis not present

## 2017-04-05 DIAGNOSIS — I480 Paroxysmal atrial fibrillation: Secondary | ICD-10-CM | POA: Diagnosis not present

## 2017-04-05 DIAGNOSIS — J9610 Chronic respiratory failure, unspecified whether with hypoxia or hypercapnia: Secondary | ICD-10-CM | POA: Diagnosis not present

## 2017-04-05 DIAGNOSIS — I10 Essential (primary) hypertension: Secondary | ICD-10-CM | POA: Diagnosis not present

## 2017-04-05 DIAGNOSIS — R5383 Other fatigue: Secondary | ICD-10-CM | POA: Diagnosis not present

## 2017-04-05 MED ORDER — WARFARIN SODIUM 2.5 MG PO TABS: 2.5000 mg | ORAL_TABLET | Freq: Every day | ORAL | 3 refills | Status: DC

## 2017-04-05 NOTE — Telephone Encounter (Signed)
Reviewed.  Dr. Maudie Mercury to give recommendations re: dosing.

## 2017-04-06 ENCOUNTER — Telehealth: Payer: Self-pay

## 2017-04-06 DIAGNOSIS — I279 Pulmonary heart disease, unspecified: Secondary | ICD-10-CM | POA: Diagnosis not present

## 2017-04-06 DIAGNOSIS — I10 Essential (primary) hypertension: Secondary | ICD-10-CM | POA: Diagnosis not present

## 2017-04-06 DIAGNOSIS — J9610 Chronic respiratory failure, unspecified whether with hypoxia or hypercapnia: Secondary | ICD-10-CM | POA: Diagnosis not present

## 2017-04-06 DIAGNOSIS — R42 Dizziness and giddiness: Secondary | ICD-10-CM | POA: Diagnosis not present

## 2017-04-06 DIAGNOSIS — L989 Disorder of the skin and subcutaneous tissue, unspecified: Secondary | ICD-10-CM | POA: Diagnosis not present

## 2017-04-06 DIAGNOSIS — G894 Chronic pain syndrome: Secondary | ICD-10-CM | POA: Diagnosis not present

## 2017-04-06 DIAGNOSIS — I480 Paroxysmal atrial fibrillation: Secondary | ICD-10-CM | POA: Diagnosis not present

## 2017-04-06 DIAGNOSIS — C44311 Basal cell carcinoma of skin of nose: Secondary | ICD-10-CM | POA: Diagnosis not present

## 2017-04-06 DIAGNOSIS — G4733 Obstructive sleep apnea (adult) (pediatric): Secondary | ICD-10-CM | POA: Diagnosis not present

## 2017-04-06 DIAGNOSIS — R5383 Other fatigue: Secondary | ICD-10-CM | POA: Diagnosis not present

## 2017-04-06 DIAGNOSIS — J449 Chronic obstructive pulmonary disease, unspecified: Secondary | ICD-10-CM | POA: Diagnosis not present

## 2017-04-06 NOTE — Telephone Encounter (Signed)
Dr Daryll Drown, please redo the scripts for pain med [facility] I dont know how [facility] was added back to her profile, I will call [facility] and cancel scripts

## 2017-04-06 NOTE — Telephone Encounter (Signed)
Called [facility] and cancelled

## 2017-04-06 NOTE — Telephone Encounter (Signed)
Pt states there is two refilled left for   HYDROcodone-acetaminophen (NORCO) 10-325 MG tablet.   And its at the [facility]. Pt is not using the [facility]. [facility] [place] [place] Would like a call back to notify her this will be fix.

## 2017-04-09 ENCOUNTER — Encounter: Payer: Self-pay | Admitting: Pharmacist

## 2017-04-09 DIAGNOSIS — J449 Chronic obstructive pulmonary disease, unspecified: Secondary | ICD-10-CM | POA: Diagnosis not present

## 2017-04-09 DIAGNOSIS — L989 Disorder of the skin and subcutaneous tissue, unspecified: Secondary | ICD-10-CM | POA: Diagnosis not present

## 2017-04-09 DIAGNOSIS — G894 Chronic pain syndrome: Secondary | ICD-10-CM | POA: Diagnosis not present

## 2017-04-09 DIAGNOSIS — Z86718 Personal history of other venous thrombosis and embolism: Secondary | ICD-10-CM

## 2017-04-09 DIAGNOSIS — I279 Pulmonary heart disease, unspecified: Secondary | ICD-10-CM | POA: Diagnosis not present

## 2017-04-09 DIAGNOSIS — Z86711 Personal history of pulmonary embolism: Secondary | ICD-10-CM

## 2017-04-09 DIAGNOSIS — I10 Essential (primary) hypertension: Secondary | ICD-10-CM | POA: Diagnosis not present

## 2017-04-09 DIAGNOSIS — R42 Dizziness and giddiness: Secondary | ICD-10-CM | POA: Diagnosis not present

## 2017-04-09 DIAGNOSIS — J9610 Chronic respiratory failure, unspecified whether with hypoxia or hypercapnia: Secondary | ICD-10-CM | POA: Diagnosis not present

## 2017-04-09 DIAGNOSIS — C44311 Basal cell carcinoma of skin of nose: Secondary | ICD-10-CM | POA: Diagnosis not present

## 2017-04-09 DIAGNOSIS — Z7901 Long term (current) use of anticoagulants: Secondary | ICD-10-CM | POA: Diagnosis not present

## 2017-04-09 DIAGNOSIS — I480 Paroxysmal atrial fibrillation: Secondary | ICD-10-CM | POA: Diagnosis not present

## 2017-04-09 DIAGNOSIS — G4733 Obstructive sleep apnea (adult) (pediatric): Secondary | ICD-10-CM | POA: Diagnosis not present

## 2017-04-09 DIAGNOSIS — R5383 Other fatigue: Secondary | ICD-10-CM | POA: Diagnosis not present

## 2017-04-09 LAB — PROTIME-INR

## 2017-04-09 LAB — POCT INR: INR: 2

## 2017-04-09 NOTE — Patient Instructions (Signed)
Patient educated about medication as defined in this encounter and verbalized understanding by repeating back instructions provided.

## 2017-04-09 NOTE — Progress Notes (Signed)
Anticoagulation Management Jacqueline Nguyen a 84 y.o.femalewho is onwarfarintreatment.   Indication: DVT and PEhistory, paroxysmal atrial fibrillation Duration: indefinite Supervising physician: Gilles Chiquito  Patient reports no signs or symptoms of bleeding or thromboembolism.  Anticoagulation Episode Summary    Current INR goal:   2.0-3.0  TTR:   71.9 % (8.2 mo)  Next INR check:   04/16/2017  INR from last check:   1.9! (04/02/2017)  Weekly max warfarin dose:     Target end date:     INR check location:     Preferred lab:     Send INR reminders to:      Indications   History of DVT (deep vein thrombosis) [Z86.718] Anticoagulated on Coumadin [Z51.81 Z79.01] History of pulmonary embolism [Z86.711]       Comments:         Anticoagulation Care Providers    Provider Role Specialty Phone number   [name], MD Responsible Internal Medicine [phone]     Allergies  Allergen Reactions  . Ciprofloxacin   . Bactrim Rash  . Codeine Nausea Only   Medication Sig  acetic acid-hydrocortisone (VOSOL-HC) otic solution Place 3 drops into the right ear 3 (three) times daily. For seven days.  Ascorbic Acid (VITAMIN C PO) Take 1 capsule by mouth daily.  atenolol (TENORMIN) 25 MG tablet Take 1 tablet (25 mg total) by mouth 4 (four) times daily as needed. MAY TAKE 1 TABLET 4 TIMES DAILY AS NEEDED.  AZOPT 1 % ophthalmic suspension Place 1 drop into both eyes 2 (two) times daily.  budesonide-formoterol (SYMBICORT) 160-4.5 MCG/ACT inhaler Inhale 2 puffs into the lungs 2 (two) times daily. Rinse mouth after each use  Cholecalciferol (VITAMIN D PO) Take 1 capsule by mouth daily.  clotrimazole (LOTRIMIN) 1 % cream Apply 1 application 2 (two) times daily topically. Approximately 1 gram to the affected area.  COMBIGAN 0.2-0.5 % ophthalmic solution Place 1 drop into both eyes 2 (two) times daily.  cyanocobalamin 1000 MCG tablet Take 1,000 mcg by mouth daily.  diltiazem (CARDIZEM CD)  120 MG 24 hr capsule TAKE ONE (1) CAPSULE BY MOUTH 2 TIMES DAILY  FLOVENT HFA 44 MCG/ACT inhaler INHALE 1 PUFF TWICE A DAY DAILY  furosemide (LASIX) 20 MG tablet TAKE 1 TABLET (20 MG TOTAL) BY MOUTH DAILY.  HYDROcodone-acetaminophen (NORCO) 10-325 MG tablet Take 1 tablet by mouth every 6 (six) hours as needed for severe pain.  levalbuterol (XOPENEX) 0.63 MG/3ML nebulizer solution Take 3 mLs (0.63 mg total) by nebulization every 8 (eight) hours as needed for wheezing.  meclizine (ANTIVERT) 25 MG tablet Take 1 tablet (25 mg total) by mouth 2 (two) times daily as needed for dizziness. TAKE ONE TABLET 2-3 TIMES A DAY AS NEEDED FOR VERTIGO  Multiple Vitamin (MULTIVITAMIN WITH MINERALS) TABS tablet Take 1 tablet by mouth daily.  Nystatin (Golden) 100000 UNIT/GM POWD Apply topically.  predniSONE (DELTASONE) 10 MG tablet 4 tabs x 2 days, 3 tabs x 2 days, 2 tabs x 2 days, 1 tab x 2 days.  Respiratory Therapy Supplies (FLUTTER) DEVI Blow through 4 times per set, three sets daily  warfarin (COUMADIN) 2.5 MG tablet Take 1 tablet (2.5 mg total) by mouth daily for 1 dose.   Past Medical History:  Diagnosis Date  . Arthritis   . Cataract   . Chronic respiratory failure ([place])   . Endometriosis   . HTN (hypertension)   . Hx of echocardiogram    Echo (8/15):  Mild LVH, EF 65%, no  RWMA, Ao sclerosis, no AS, mod MAC, mild LAE, normal RVSF, PASP 40 mmHg  . Hx pulmonary embolism    on chronic coumadin  . Obesities, morbid ([place])   . OSA (obstructive sleep apnea)   . Osteoporosis   . PAF (paroxysmal atrial fibrillation) ([place])    Event Monitor (8/15):  Frequent PACs, brief bursts of nonsustained ATach; no AFib  . Psoriasis   . Skin cancer   . Vertigo    Social History   Socioeconomic History  . Marital status: Widowed    Spouse name: Not on file  . Number of children: Not on file  . Years of education: Not on file  . Highest education level: Not on file  Social Needs  . Financial resource strain: Not  on file  . Food insecurity - worry: Not on file  . Food insecurity - inability: Not on file  . Transportation needs - medical: Not on file  . Transportation needs - non-medical: Not on file  Occupational History  . Not on file  Tobacco Use  . Smoking status: Former Smoker    Packs/day: 1.00    Years: 2.00    Pack years: 2.00    Types: Cigarettes    Last attempt to quit: 04/03/1981    Years since quitting: 36.0  . Smokeless tobacco: Never Used  Substance and Sexual Activity  . Alcohol use: No  . Drug use: No  . Sexual activity: Not Currently  Other Topics Concern  . Not on file  Social History Narrative   She lives with her sister in [place] and requires assistance at home. Has an aid who visits and helps at home often. Son works as a Catering manager and also visits and helps when he can. Semi-independent in ADLs, requires walker around home, dependent in IADLs.   Family History  Problem Relation Age of Onset  . Heart attack Father   . Heart attack Brother   . Stroke Neg Hx    ASSESSMENT Recent Results: Lab Results  Component Value Date   INR 1.9 04/02/2017   INR 2.1 03/26/2017   INR 1.8 03/19/2017   Anticoagulation Dosing: 2.5 mg daily   INR today: Therapeutic  PLAN Weekly dose was unchanged  Patient Instructions  Patient educated about medication as defined in this encounter and verbalized understanding by repeating back instructions provided.    Patient advised to contact clinic or seek medical attention if signs/symptoms of bleeding or thromboembolism occur.  Patient verbalized understanding by repeating back information and was advised to contact me if further medication-related questions arise. Patient was also provided an information handout.  Follow-up No Follow-up on file.  [name]

## 2017-04-10 LAB — PROTIME-INR

## 2017-04-10 NOTE — Patient Instructions (Signed)
Patient educated about medication as defined in this encounter and verbalized understanding by repeating back instructions provided.

## 2017-04-10 NOTE — Progress Notes (Signed)
I reviewed Dr. Julianne Rice note.  INR at goal.  Dose unchanged.

## 2017-04-10 NOTE — Progress Notes (Signed)
Anticoagulation Management Jacqueline Nguyen a 84 y.o.femalewho is onwarfarintreatment.   Indication: DVT and PEhistory, paroxysmal atrial fibrillation Duration: indefinite Supervising physician: Gilles Chiquito  Patient reports no signs or symptoms of bleeding or thromboembolism.  Anticoagulation Episode Summary    Current INR goal:   2.0-3.0  TTR:   70.0 % (8.5 mo)  Next INR check:   04/16/2017  INR from last check:   2.0 (04/09/2017)  Weekly max warfarin dose:     Target end date:     INR check location:     Preferred lab:     Send INR reminders to:      Indications   History of DVT (deep vein thrombosis) [Z86.718] Anticoagulated on Coumadin [Z51.81 Z79.01] History of pulmonary embolism [Z86.711]       Comments:         Anticoagulation Care Providers    Provider Role Specialty Phone number   Jacqueline Falcon, MD Responsible Internal Medicine [phone]     Allergies  Allergen Reactions  . Ciprofloxacin   . Bactrim Rash  . Codeine Nausea Only   Medication Sig  acetic acid-hydrocortisone (VOSOL-HC) otic solution Place 3 drops into the right ear 3 (three) times daily. For seven days.  Ascorbic Acid (VITAMIN C PO) Take 1 capsule by mouth daily.  atenolol (TENORMIN) 25 MG tablet Take 1 tablet (25 mg total) by mouth 4 (four) times daily as needed. MAY TAKE 1 TABLET 4 TIMES DAILY AS NEEDED.  AZOPT 1 % ophthalmic suspension Place 1 drop into both eyes 2 (two) times daily.  budesonide-formoterol (SYMBICORT) 160-4.5 MCG/ACT inhaler Inhale 2 puffs into the lungs 2 (two) times daily. Rinse mouth after each use  Cholecalciferol (VITAMIN D PO) Take 1 capsule by mouth daily.  clotrimazole (LOTRIMIN) 1 % cream Apply 1 application 2 (two) times daily topically. Approximately 1 gram to the affected area.  COMBIGAN 0.2-0.5 % ophthalmic solution Place 1 drop into both eyes 2 (two) times daily.  cyanocobalamin 1000 MCG tablet Take 1,000 mcg by mouth daily.  diltiazem (CARDIZEM CD)  120 MG 24 hr capsule TAKE ONE (1) CAPSULE BY MOUTH 2 TIMES DAILY  FLOVENT HFA 44 MCG/ACT inhaler INHALE 1 PUFF TWICE A DAY DAILY  furosemide (LASIX) 20 MG tablet TAKE 1 TABLET (20 MG TOTAL) BY MOUTH DAILY.  HYDROcodone-acetaminophen (NORCO) 10-325 MG tablet Take 1 tablet by mouth every 6 (six) hours as needed for severe pain.  levalbuterol (XOPENEX) 0.63 MG/3ML nebulizer solution Take 3 mLs (0.63 mg total) by nebulization every 8 (eight) hours as needed for wheezing.  meclizine (ANTIVERT) 25 MG tablet Take 1 tablet (25 mg total) by mouth 2 (two) times daily as needed for dizziness. TAKE ONE TABLET 2-3 TIMES A DAY AS NEEDED FOR VERTIGO  Multiple Vitamin (MULTIVITAMIN WITH MINERALS) TABS tablet Take 1 tablet by mouth daily.  Nystatin ([place]) 100000 UNIT/GM POWD Apply topically.  predniSONE (DELTASONE) 10 MG tablet 4 tabs x 2 days, 3 tabs x 2 days, 2 tabs x 2 days, 1 tab x 2 days.  Respiratory Therapy Supplies (FLUTTER) DEVI Blow through 4 times per set, three sets daily  warfarin (COUMADIN) 2.5 MG tablet Take 1 tablet (2.5 mg total) by mouth daily for 1 dose.   Past Medical History:  Diagnosis Date  . Arthritis   . Cataract   . Chronic respiratory failure ([place])   . Endometriosis   . HTN (hypertension)   . Hx of echocardiogram    Echo (8/15):  Mild LVH, EF 65%, no  RWMA, Ao sclerosis, no AS, mod MAC, mild LAE, normal RVSF, PASP 40 mmHg  . Hx pulmonary embolism    on chronic coumadin  . Obesities, morbid ([place])   . OSA (obstructive sleep apnea)   . Osteoporosis   . PAF (paroxysmal atrial fibrillation) ([place])    Event Monitor (8/15):  Frequent PACs, brief bursts of nonsustained ATach; no AFib  . Psoriasis   . Skin cancer   . Vertigo    Social History   Socioeconomic History  . Marital status: Widowed    Spouse name: Not on file  . Number of children: Not on file  . Years of education: Not on file  . Highest education level: Not on file  Social Needs  . Financial resource strain: Not  on file  . Food insecurity - worry: Not on file  . Food insecurity - inability: Not on file  . Transportation needs - medical: Not on file  . Transportation needs - non-medical: Not on file  Occupational History  . Not on file  Tobacco Use  . Smoking status: Former Smoker    Packs/day: 1.00    Years: 2.00    Pack years: 2.00    Types: Cigarettes    Last attempt to quit: 04/03/1981    Years since quitting: 36.0  . Smokeless tobacco: Never Used  Substance and Sexual Activity  . Alcohol use: No  . Drug use: No  . Sexual activity: Not Currently  Other Topics Concern  . Not on file  Social History Narrative   She lives with her sister in [place] and requires assistance at home. Has an aid who visits and helps at home often. Son works as a Catering manager and also visits and helps when he can. Semi-independent in ADLs, requires walker around home, dependent in IADLs.   Family History  Problem Relation Age of Onset  . Heart attack Father   . Heart attack Brother   . Stroke Neg Hx    ASSESSMENT Recent Results: The most recent result is correlated with 17.5 mg per week: Lab Results  Component Value Date   INR 2.0 04/09/2017   INR 1.9 04/02/2017   INR 2.1 03/26/2017   Anticoagulation Dosing: 2.5 mg daily   INR today: Therapeutic  PLAN Weekly dose was unchanged   Patient Instructions  Patient educated about medication as defined in this encounter and verbalized understanding by repeating back instructions provided.   Patient advised to contact clinic or seek medical attention if signs/symptoms of bleeding or thromboembolism occur.  Patient verbalized understanding by repeating back information and was advised to contact me if further medication-related questions arise. Patient was also provided an information handout.  Follow-up Return in about 1 week (around 04/16/2017).  Jacqueline Nguyen

## 2017-04-11 LAB — PROTIME-INR

## 2017-04-12 DIAGNOSIS — G4733 Obstructive sleep apnea (adult) (pediatric): Secondary | ICD-10-CM | POA: Diagnosis not present

## 2017-04-13 ENCOUNTER — Telehealth: Payer: Self-pay | Admitting: Internal Medicine

## 2017-04-13 NOTE — Telephone Encounter (Signed)
Dr Daryll Drown, pt needs her pain med sent to her pharm in [place], I cancelled it at [facility] but nothing has gone to new pharm

## 2017-04-13 NOTE — Telephone Encounter (Signed)
Patient calling checkup on her prescription

## 2017-04-16 ENCOUNTER — Encounter: Payer: Self-pay | Admitting: Pharmacist

## 2017-04-16 ENCOUNTER — Other Ambulatory Visit: Payer: Self-pay | Admitting: *Deleted

## 2017-04-16 DIAGNOSIS — Z86718 Personal history of other venous thrombosis and embolism: Secondary | ICD-10-CM

## 2017-04-16 DIAGNOSIS — Z7901 Long term (current) use of anticoagulants: Secondary | ICD-10-CM

## 2017-04-16 DIAGNOSIS — Z86711 Personal history of pulmonary embolism: Secondary | ICD-10-CM

## 2017-04-16 MED ORDER — HYDROCODONE-ACETAMINOPHEN 10-325 MG PO TABS: 1.0000 | ORAL_TABLET | Freq: Four times a day (QID) | ORAL | 0 refills | Status: DC | PRN

## 2017-04-16 NOTE — Telephone Encounter (Signed)
I sent a refill to the [place], [place] pharmacy.  Let me know if anything else needs to be done.  Thank you!

## 2017-04-17 LAB — POCT INR: INR: 2.3

## 2017-04-17 MED ORDER — ATENOLOL 25 MG PO TABS: 25.0000 mg | ORAL_TABLET | Freq: Four times a day (QID) | ORAL | 3 refills | Status: DC | PRN

## 2017-04-17 NOTE — Patient Instructions (Signed)
Patient educated about medication as defined in this encounter and verbalized understanding by repeating back instructions provided.

## 2017-04-17 NOTE — Progress Notes (Signed)
Anticoagulation Management Jacqueline Nguyen a 84 y.o.femalewho is onwarfarintreatment.   Indication: DVT and PEhistory, paroxysmal atrial fibrillation Duration: indefinite Supervising physician: Gilles Chiquito  Patient reports no signs or symptoms of bleeding or thromboembolism.  Anticoagulation Episode Summary    Current INR goal:   2.0-3.0  TTR:   70.8 % (8.7 mo)  Next INR check:   04/16/2017  INR from last check:   2.3 (04/16/2017)  Weekly max warfarin dose:     Target end date:     INR check location:     Preferred lab:     Send INR reminders to:      Indications   History of DVT (deep vein thrombosis) [Z86.718] Anticoagulated on Coumadin [Z51.81 Z79.01] History of pulmonary embolism [Z86.711]       Comments:         Anticoagulation Care Providers    Provider Role Specialty Phone number   [name], MD Responsible Internal Medicine [phone]     Allergies  Allergen Reactions  . Ciprofloxacin   . Bactrim Rash  . Codeine Nausea Only   Medication Sig  acetic acid-hydrocortisone (VOSOL-HC) otic solution Place 3 drops into the right ear 3 (three) times daily. For seven days.  Ascorbic Acid (VITAMIN C PO) Take 1 capsule by mouth daily.  atenolol (TENORMIN) 25 MG tablet Take 1 tablet (25 mg total) by mouth 4 (four) times daily as needed. MAY TAKE 1 TABLET 4 TIMES DAILY AS NEEDED.  AZOPT 1 % ophthalmic suspension Place 1 drop into both eyes 2 (two) times daily.  budesonide-formoterol (SYMBICORT) 160-4.5 MCG/ACT inhaler Inhale 2 puffs into the lungs 2 (two) times daily. Rinse mouth after each use  Cholecalciferol (VITAMIN D PO) Take 1 capsule by mouth daily.  clotrimazole (LOTRIMIN) 1 % cream Apply 1 application 2 (two) times daily topically. Approximately 1 gram to the affected area.  COMBIGAN 0.2-0.5 % ophthalmic solution Place 1 drop into both eyes 2 (two) times daily.  cyanocobalamin 1000 MCG tablet Take 1,000 mcg by mouth daily.  diltiazem (CARDIZEM CD)  120 MG 24 hr capsule TAKE ONE (1) CAPSULE BY MOUTH 2 TIMES DAILY  FLOVENT HFA 44 MCG/ACT inhaler INHALE 1 PUFF TWICE A DAY DAILY  furosemide (LASIX) 20 MG tablet TAKE 1 TABLET (20 MG TOTAL) BY MOUTH DAILY.  HYDROcodone-acetaminophen (NORCO) 10-325 MG tablet Take 1 tablet by mouth every 6 (six) hours as needed for severe pain.  levalbuterol (XOPENEX) 0.63 MG/3ML nebulizer solution Take 3 mLs (0.63 mg total) by nebulization every 8 (eight) hours as needed for wheezing.  meclizine (ANTIVERT) 25 MG tablet Take 1 tablet (25 mg total) by mouth 2 (two) times daily as needed for dizziness. TAKE ONE TABLET 2-3 TIMES A DAY AS NEEDED FOR VERTIGO  Multiple Vitamin (MULTIVITAMIN WITH MINERALS) TABS tablet Take 1 tablet by mouth daily.  Nystatin ([name]) 100000 UNIT/GM POWD Apply topically.  predniSONE (DELTASONE) 10 MG tablet 4 tabs x 2 days, 3 tabs x 2 days, 2 tabs x 2 days, 1 tab x 2 days.  Respiratory Therapy Supplies (FLUTTER) DEVI Blow through 4 times per set, three sets daily  warfarin (COUMADIN) 2.5 MG tablet Take 1 tablet (2.5 mg total) by mouth daily for 1 dose.   Past Medical History:  Diagnosis Date  . Arthritis   . Cataract   . Chronic respiratory failure ([place])   . Endometriosis   . HTN (hypertension)   . Hx of echocardiogram    Echo (8/15):  Mild LVH, EF 65%, no  RWMA, Ao sclerosis, no AS, mod MAC, mild LAE, normal RVSF, PASP 40 mmHg  . Hx pulmonary embolism    on chronic coumadin  . Obesities, morbid ([place])   . OSA (obstructive sleep apnea)   . Osteoporosis   . PAF (paroxysmal atrial fibrillation) ([place])    Event Monitor (8/15):  Frequent PACs, brief bursts of nonsustained ATach; no AFib  . Psoriasis   . Skin cancer   . Vertigo    Social History   Socioeconomic History  . Marital status: Widowed    Spouse name: Not on file  . Number of children: Not on file  . Years of education: Not on file  . Highest education level: Not on file  Social Needs  . Financial resource strain: Not  on file  . Food insecurity - worry: Not on file  . Food insecurity - inability: Not on file  . Transportation needs - medical: Not on file  . Transportation needs - non-medical: Not on file  Occupational History  . Not on file  Tobacco Use  . Smoking status: Former Smoker    Packs/day: 1.00    Years: 2.00    Pack years: 2.00    Types: Cigarettes    Last attempt to quit: 04/03/1981    Years since quitting: 36.0  . Smokeless tobacco: Never Used  Substance and Sexual Activity  . Alcohol use: No  . Drug use: No  . Sexual activity: Not Currently  Other Topics Concern  . Not on file  Social History Narrative   She lives with her sister in [place] and requires assistance at home. Has an aid who visits and helps at home often. Son works as a Catering manager and also visits and helps when he can. Semi-independent in ADLs, requires walker around home, dependent in IADLs.   Family History  Problem Relation Age of Onset  . Heart attack Father   . Heart attack Brother   . Stroke Neg Hx    ASSESSMENT Recent Results: Lab Results  Component Value Date   INR 2.3 04/16/2017   INR 2.0 04/09/2017   INR 1.9 04/02/2017   Anticoagulation Dosing: 2.5 mg daily   INR today: Therapeutic  PLAN Weekly dose was unchanged  Patient Instructions  Patient educated about medication as defined in this encounter and verbalized understanding by repeating back instructions provided.   Patient advised to contact clinic or seek medical attention if signs/symptoms of bleeding or thromboembolism occur.  Patient verbalized understanding by repeating back information and was advised to contact me if further medication-related questions arise. Patient was also provided an information handout.  Follow-up No Follow-up on file.  [name]

## 2017-04-18 ENCOUNTER — Ambulatory Visit (INDEPENDENT_AMBULATORY_CARE_PROVIDER_SITE_OTHER): Payer: Medicare Other | Admitting: Internal Medicine

## 2017-04-18 ENCOUNTER — Encounter: Payer: Self-pay | Admitting: Internal Medicine

## 2017-04-18 ENCOUNTER — Other Ambulatory Visit: Payer: Self-pay

## 2017-04-18 VITALS — BP 155/54 | HR 58 | Temp 97.8°F | Ht 65.0 in | Wt 256.1 lb

## 2017-04-18 DIAGNOSIS — Z9989 Dependence on other enabling machines and devices: Secondary | ICD-10-CM

## 2017-04-18 DIAGNOSIS — Z6841 Body Mass Index (BMI) 40.0 and over, adult: Secondary | ICD-10-CM

## 2017-04-18 DIAGNOSIS — E669 Obesity, unspecified: Secondary | ICD-10-CM

## 2017-04-18 DIAGNOSIS — I50810 Right heart failure, unspecified: Secondary | ICD-10-CM | POA: Diagnosis not present

## 2017-04-18 DIAGNOSIS — G8929 Other chronic pain: Secondary | ICD-10-CM

## 2017-04-18 DIAGNOSIS — X58XXXA Exposure to other specified factors, initial encounter: Secondary | ICD-10-CM

## 2017-04-18 DIAGNOSIS — J449 Chronic obstructive pulmonary disease, unspecified: Secondary | ICD-10-CM

## 2017-04-18 DIAGNOSIS — I480 Paroxysmal atrial fibrillation: Secondary | ICD-10-CM

## 2017-04-18 DIAGNOSIS — Z87891 Personal history of nicotine dependence: Secondary | ICD-10-CM

## 2017-04-18 DIAGNOSIS — Z79899 Other long term (current) drug therapy: Secondary | ICD-10-CM

## 2017-04-18 DIAGNOSIS — S32000A Wedge compression fracture of unspecified lumbar vertebra, initial encounter for closed fracture: Secondary | ICD-10-CM

## 2017-04-18 DIAGNOSIS — J3489 Other specified disorders of nose and nasal sinuses: Secondary | ICD-10-CM

## 2017-04-18 DIAGNOSIS — Z7901 Long term (current) use of anticoagulants: Secondary | ICD-10-CM

## 2017-04-18 DIAGNOSIS — G4733 Obstructive sleep apnea (adult) (pediatric): Secondary | ICD-10-CM | POA: Diagnosis not present

## 2017-04-18 DIAGNOSIS — R918 Other nonspecific abnormal finding of lung field: Secondary | ICD-10-CM

## 2017-04-18 DIAGNOSIS — H401132 Primary open-angle glaucoma, bilateral, moderate stage: Secondary | ICD-10-CM | POA: Diagnosis not present

## 2017-04-18 DIAGNOSIS — H409 Unspecified glaucoma: Secondary | ICD-10-CM

## 2017-04-18 DIAGNOSIS — R05 Cough: Secondary | ICD-10-CM

## 2017-04-18 DIAGNOSIS — J9610 Chronic respiratory failure, unspecified whether with hypoxia or hypercapnia: Secondary | ICD-10-CM

## 2017-04-18 DIAGNOSIS — Z79891 Long term (current) use of opiate analgesic: Secondary | ICD-10-CM

## 2017-04-18 DIAGNOSIS — I10 Essential (primary) hypertension: Secondary | ICD-10-CM

## 2017-04-18 DIAGNOSIS — M1610 Unilateral primary osteoarthritis, unspecified hip: Secondary | ICD-10-CM

## 2017-04-18 DIAGNOSIS — Z0000 Encounter for general adult medical examination without abnormal findings: Secondary | ICD-10-CM

## 2017-04-18 DIAGNOSIS — I110 Hypertensive heart disease with heart failure: Secondary | ICD-10-CM | POA: Diagnosis not present

## 2017-04-18 DIAGNOSIS — M1611 Unilateral primary osteoarthritis, right hip: Secondary | ICD-10-CM | POA: Diagnosis not present

## 2017-04-18 DIAGNOSIS — M25562 Pain in left knee: Secondary | ICD-10-CM

## 2017-04-18 DIAGNOSIS — R42 Dizziness and giddiness: Secondary | ICD-10-CM

## 2017-04-18 DIAGNOSIS — E2839 Other primary ovarian failure: Secondary | ICD-10-CM

## 2017-04-18 DIAGNOSIS — R059 Cough, unspecified: Secondary | ICD-10-CM

## 2017-04-18 DIAGNOSIS — M25551 Pain in right hip: Secondary | ICD-10-CM | POA: Diagnosis not present

## 2017-04-18 MED ORDER — HYDROCODONE-ACETAMINOPHEN 10-325 MG PO TABS: 1.0000 | ORAL_TABLET | Freq: Four times a day (QID) | ORAL | 0 refills | Status: DC | PRN

## 2017-04-18 NOTE — Assessment & Plan Note (Signed)
Mainly AM mucus production is her main concern, also with coughing.    Plan Decongestant - advised coricidin brand as it will not raise her BP Trial of flonase She will let me know if her vomiting improves with this therapy.

## 2017-04-18 NOTE — Assessment & Plan Note (Signed)
Last MMG was in 2009 when she was 75.  Discuss at next visit.   I do not have records for Colonoscopy or PAP smears; she is a never smoker.   Given the new compression fracture on CT scan, I did recommend a DEXA scan today which was ordered.

## 2017-04-18 NOTE — Assessment & Plan Note (Signed)
CT chest from October reviewed with patient.  She is within window to complete a follow up exam which was ordered and she agreed to get.

## 2017-04-18 NOTE — Patient Instructions (Signed)
Jacqueline Nguyen - -   For your hip pain, please discuss with Dr. Recardo Evangelist about going to get injections for your hip and possibly your knee.   For your back pain and recent compression fracture (seen on CAT scan), please get a DEXA scan to see if your bones are thinner (osteoporosis).    For your lung nodules, please get a CAT scan of the lungs to see if these have changed.    For your mucus, try Coricidin brand cough medication over the counter for the decongestant.  Also try Flonase over the counter to see if these help.   Thank you!  Bone Densitometry Bone densitometry is an imaging test that uses a special X-ray to measure the amount of calcium and other minerals in your bones (bone density). This test is also known as a bone mineral density test or dual-energy X-ray absorptiometry (DXA). The test can measure bone density at your hip and your spine. It is similar to having a regular X-ray. You may have this test to:  Diagnose a condition that causes weak or thin bones (osteoporosis).  Predict your risk of a broken bone (fracture).  Determine how well osteoporosis treatment is working.  Tell a health care provider about:  Any allergies you have.  All medicines you are taking, including vitamins, herbs, eye drops, creams, and over-the-counter medicines.  Any problems you or family members have had with anesthetic medicines.  Any blood disorders you have.  Any surgeries you have had.  Any medical conditions you have.  Possibility of pregnancy.  Any other medical test you had within the previous 14 days that used contrast material. What are the risks? Generally, this is a safe procedure. However, problems can occur and may include the following:  This test exposes you to a very small amount of radiation.  The risks of radiation exposure may be greater to unborn children.  What happens before the procedure?  Do not take any calcium supplements for 24 hours before having the  test. You can otherwise eat and drink what you usually do.  Take off all metal jewelry, eyeglasses, dental appliances, and any other metal objects. What happens during the procedure?  You may lie on an exam table. There will be an X-ray generator below you and an imaging device above you.  Other devices, such as boxes or braces, may be used to position your body properly for the scan.  You will need to lie still while the machine slowly scans your body.  The images will show up on a computer monitor. What happens after the procedure? You may need more testing at a later time. This information is not intended to replace advice given to you by your health care provider. Make sure you discuss any questions you have with your health care provider. Document Released: 04/11/2004 Document Revised: 08/26/2015 Document Reviewed: 08/28/2013 Elsevier Interactive Patient Education  2018 [employer].

## 2017-04-18 NOTE — Assessment & Plan Note (Signed)
BP was 155/54.  I feel this is likely at the range it should be given her diastolic pressure.  She is due to follow up with Dr. Recardo Evangelist in the near future.    Plan Continue diltiazem, atenolol and home monitoring.

## 2017-04-18 NOTE — Assessment & Plan Note (Signed)
This is one of the main things we talked about today.  She has chronic hip pain and presumed arthritis.  She is not a candidate for surgery.  I think her hip and knee pain are exacerbated by her lack of mobility and how she sleeps.  She is already on topical medication and narcotics.  She is not a good candidate for NSAID therapy given age and coumadin use.  She may benefit from scheduled tylenol.  Today we discussed injectable therapy with sports medicine which may provide some relief.  She will discuss with her cardiologist.   Plan Consider SM consult for injectable therapy Continue current therapy.

## 2017-04-18 NOTE — Progress Notes (Signed)
Subjective:    Patient ID: Jacqueline Nguyen, female    DOB: [birth year], 84 y.o.   MRN: [id number]  3 month follow up for HTN, OSA  HPI   Jacqueline Nguyen is an 84yo woman with PMH of COPD, chronic respiratory failure, OSA, chronic pain, rt sided heart failure, arthritis, HTN, pAF on coumadin, vertigo, glaucoma who presents for routine follow up.    Jacqueline Nguyen had 84 acute complaints, worsening left hip and knee pain and increased mucus production with morning cough.    For her hip pain, this has been an ongoing issue for her.  She has had worsening to where she is sore all of the time and it radiates from her left hip to her knee.  She further has symptoms in her mid back and it is difficult for her to get comfortable.  Unfortunately, she is very immobile using a motorized chair during the day and sleeping in a recliner at night.  She uses arnacare (topical medication) and intermittent hydrocodone for the pain.  She has been told that she should not have surgery by her Cardiologist which I agree with.  I discussed with her possible injectable medications like hip injections and she would like to discuss with her cardiologist prior to proceeding.  I would also think PT may help her, but she has had home based PT that just finished without much improvement.   She further notes a morning cough and increased mucus production.  She notes that she feels she gets lots of mucus build up overnight while using her CPAP and then has trouble coughing it up.  She has tried OTC mucolytics without much help.  She further will have emesis with morning meals (sometimes later in the day) which she attributes to the mucus.  Her son who is present with her states the emesis only happens when she is angry (she denies).  This has been going on about a month.    I reviewed some of her imaging and she had a CT chest in October which showed a new pulmonary nodule, recommended 3-6 month follow up and a new compression fracture of the spine.      She had elevated systolic BP today. However, it is my and her understanding that her Cardiologist prefers  That we allow a higher SBP given her consistently low DBP.  This was consistent with her BP today.      Review of Systems  Constitutional: Positive for activity change. Negative for chills and fever.  HENT: Positive for postnasal drip and rhinorrhea. Negative for sinus pressure, sinus pain and trouble swallowing.   Eyes: Negative for photophobia and visual disturbance.  Respiratory: Positive for cough. Negative for chest tightness and shortness of breath.   Cardiovascular: Negative for chest pain.  Musculoskeletal: Positive for arthralgias, back pain and gait problem.  Neurological: Negative for dizziness and weakness.       Objective:   Physical Exam  Constitutional: She is oriented to person, place, and time.  Elderly woman, obese, sitting in motorized chair  HENT:  Head: Normocephalic and atraumatic.  Eyes: Conjunctivae are normal. No scleral icterus.  Cardiovascular: Normal rate, regular rhythm and normal heart sounds.  No murmur heard. Pulmonary/Chest: Breath sounds normal. No respiratory distress. She has no wheezes.  Abdominal: Soft.  Neurological: She is alert and oriented to person, place, and time.  Psychiatric: She has a normal mood and affect. Her behavior is normal.  Vitals reviewed.  MSK exam difficult given patient  sitting in chair, unable to get on bench.  Pain to palpation of knee, some bony derangements of knee.  Pain to palpation at hip.    Would like to get a BMET today, but patient had to leave to make it to an eye appointment.         Assessment & Plan:  RTC in 5 months.

## 2017-04-21 DIAGNOSIS — J449 Chronic obstructive pulmonary disease, unspecified: Secondary | ICD-10-CM | POA: Diagnosis not present

## 2017-04-24 ENCOUNTER — Encounter: Payer: Self-pay | Admitting: Pharmacist

## 2017-04-24 DIAGNOSIS — Z86711 Personal history of pulmonary embolism: Secondary | ICD-10-CM

## 2017-04-24 DIAGNOSIS — Z86718 Personal history of other venous thrombosis and embolism: Secondary | ICD-10-CM

## 2017-04-24 DIAGNOSIS — Z7901 Long term (current) use of anticoagulants: Secondary | ICD-10-CM

## 2017-04-24 LAB — POCT INR: INR: 2.9

## 2017-04-24 NOTE — Patient Instructions (Signed)
Patient educated about medication as defined in this encounter and verbalized understanding by repeating back instructions provided.

## 2017-04-24 NOTE — Progress Notes (Signed)
Anticoagulation Management Jacqueline Nguyen a 84 y.o.femalewho is onwarfarintreatment.   Indication: DVT and PEhistory, paroxysmal atrial fibrillation Duration: indefinite Supervising physician: Jacqueline Nguyen  Patient reports no signs or symptoms of bleeding or thromboembolism.  Anticoagulation Episode Summary    Current INR goal:   2.0-3.0  TTR:   71.7 % (9 mo)  Next INR check:   04/30/2017  INR from last check:   2.9 (04/24/2017)  Weekly max warfarin dose:     Target end date:     INR check location:     Preferred lab:     Send INR reminders to:      Indications   History of DVT (deep vein thrombosis) [Z86.718] Anticoagulated on Coumadin [Z51.81 Z79.01] History of pulmonary embolism [Z86.711]       Comments:         Anticoagulation Care Providers    Provider Role Specialty Phone number   Jacqueline Falcon, MD Responsible Internal Medicine [phone]     Allergies  Allergen Reactions  . Ciprofloxacin   . Bactrim Rash  . Codeine Nausea Only   Medication Sig  acetic acid-hydrocortisone (VOSOL-HC) otic solution Place 3 drops into the right ear 3 (three) times daily. For seven days.  Ascorbic Acid (VITAMIN C PO) Take 1 capsule by mouth daily.  atenolol (TENORMIN) 25 MG tablet Take 1 tablet (25 mg total) by mouth 4 (four) times daily as needed. MAY TAKE 1 TABLET 4 TIMES DAILY AS NEEDED.  AZOPT 1 % ophthalmic suspension Place 1 drop into both eyes 2 (two) times daily.  budesonide-formoterol (SYMBICORT) 160-4.5 MCG/ACT inhaler Inhale 2 puffs into the lungs 2 (two) times daily. Rinse mouth after each use  Cholecalciferol (VITAMIN D PO) Take 1 capsule by mouth daily.  clotrimazole (LOTRIMIN) 1 % cream Apply 1 application 2 (two) times daily topically. Approximately 1 gram to the affected area.  COMBIGAN 0.2-0.5 % ophthalmic solution Place 1 drop into both eyes 2 (two) times daily.  cyanocobalamin 1000 MCG tablet Take 1,000 mcg by mouth daily.  diltiazem (CARDIZEM CD)  120 MG 24 hr capsule TAKE ONE (1) CAPSULE BY MOUTH 2 TIMES DAILY  FLOVENT HFA 44 MCG/ACT inhaler INHALE 1 PUFF TWICE A DAY DAILY  furosemide (LASIX) 20 MG tablet TAKE 1 TABLET (20 MG TOTAL) BY MOUTH DAILY.  HYDROcodone-acetaminophen (NORCO) 10-325 MG tablet Take 1 tablet by mouth every 6 (six) hours as needed for severe pain.  levalbuterol (XOPENEX) 0.63 MG/3ML nebulizer solution Take 3 mLs (0.63 mg total) by nebulization every 8 (eight) hours as needed for wheezing.  meclizine (ANTIVERT) 25 MG tablet Take 1 tablet (25 mg total) by mouth 2 (two) times daily as needed for dizziness. TAKE ONE TABLET 2-3 TIMES A DAY AS NEEDED FOR VERTIGO  Multiple Vitamin (MULTIVITAMIN WITH MINERALS) TABS tablet Take 1 tablet by mouth daily.  Nystatin ([place]) 100000 UNIT/GM POWD Apply topically.  predniSONE (DELTASONE) 10 MG tablet 4 tabs x 2 days, 3 tabs x 2 days, 2 tabs x 2 days, 1 tab x 2 days.  Respiratory Therapy Supplies (FLUTTER) DEVI Blow through 4 times per set, three sets daily  warfarin (COUMADIN) 2.5 MG tablet Take 1 tablet (2.5 mg total) by mouth daily for 1 dose.   Past Medical History:  Diagnosis Date  . Arthritis   . Cataract   . Chronic respiratory failure ([place])   . Endometriosis   . HTN (hypertension)   . Hx of echocardiogram    Echo (8/15):  Mild LVH, EF 65%, no  RWMA, Ao sclerosis, no AS, mod MAC, mild LAE, normal RVSF, PASP 40 mmHg  . Hx pulmonary embolism    on chronic coumadin  . Obesities, morbid ([place])   . OSA (obstructive sleep apnea)   . Osteoporosis   . PAF (paroxysmal atrial fibrillation) ([place])    Event Monitor (8/15):  Frequent PACs, brief bursts of nonsustained ATach; no AFib  . Psoriasis   . Skin cancer   . Vertigo    Social History   Socioeconomic History  . Marital status: Widowed    Spouse name: Not on file  . Number of children: Not on file  . Years of education: Not on file  . Highest education level: Not on file  Social Needs  . Financial resource strain: Not  on file  . Food insecurity - worry: Not on file  . Food insecurity - inability: Not on file  . Transportation needs - medical: Not on file  . Transportation needs - non-medical: Not on file  Occupational History  . Not on file  Tobacco Use  . Smoking status: Former Smoker    Packs/day: 1.00    Years: 2.00    Pack years: 2.00    Types: Cigarettes    Last attempt to quit: 04/03/1981    Years since quitting: 36.0  . Smokeless tobacco: Never Used  Substance and Sexual Activity  . Alcohol use: No  . Drug use: No  . Sexual activity: Not Currently  Other Topics Concern  . Not on file  Social History Narrative   She lives with her sister in [place] and requires assistance at home. Has an aid who visits and helps at home often. Son works as a Catering manager and also visits and helps when he can. Semi-independent in ADLs, requires walker around home, dependent in IADLs.   Family History  Problem Relation Age of Onset  . Heart attack Father   . Heart attack Brother   . Stroke Neg Hx    ASSESSMENT Recent Results: Lab Results  Component Value Date   INR 2.9 04/24/2017   INR 2.3 04/16/2017   INR 2.0 04/09/2017   Anticoagulation Dosing: 2.5 mg daily   INR today: Therapeutic  PLAN Weekly dose was unchanged  Patient Instructions  Patient educated about medication as defined in this encounter and verbalized understanding by repeating back instructions provided.   Patient advised to contact clinic or seek medical attention if signs/symptoms of bleeding or thromboembolism occur.  Patient verbalized understanding by repeating back information and was advised to contact me if further medication-related questions arise. Patient was also provided an information handout.  Follow-up Return in about 1 week (around 05/01/2017).  Jacqueline Nguyen

## 2017-04-25 NOTE — Progress Notes (Signed)
I reviewed Dr. Julianne Rice note.  Patient is on coumadin for Afib/flutter and INR at goal.

## 2017-04-26 LAB — PROTIME-INR

## 2017-04-29 DIAGNOSIS — G4733 Obstructive sleep apnea (adult) (pediatric): Secondary | ICD-10-CM | POA: Diagnosis not present

## 2017-04-30 ENCOUNTER — Encounter: Payer: Self-pay | Admitting: Pharmacist

## 2017-04-30 DIAGNOSIS — Z86718 Personal history of other venous thrombosis and embolism: Secondary | ICD-10-CM

## 2017-04-30 DIAGNOSIS — Z86711 Personal history of pulmonary embolism: Secondary | ICD-10-CM

## 2017-04-30 DIAGNOSIS — Z7901 Long term (current) use of anticoagulants: Secondary | ICD-10-CM

## 2017-04-30 LAB — POCT INR: INR: 2

## 2017-04-30 NOTE — Progress Notes (Signed)
Anticoagulation Management Jacqueline Nguyen a 84 y.o.femalewho is onwarfarintreatment.   Indication: DVT and PEhistory, paroxysmal atrial fibrillation Duration: indefinite Supervising physician: Gilles Chiquito  Patient reports no signs or symptoms of bleeding or thromboembolism.  Anticoagulation Episode Summary    Current INR goal:   2.0-3.0  TTR:   72.3 % (9.2 mo)  Next INR check:   05/07/2017  INR from last check:   2.0 (04/30/2017)  Weekly max warfarin dose:     Target end date:     INR check location:     Preferred lab:     Send INR reminders to:      Indications   History of DVT (deep vein thrombosis) [Z86.718] Anticoagulated on Coumadin [Z51.81 Z79.01] History of pulmonary embolism [Z86.711]       Comments:         Anticoagulation Care Providers    Provider Role Specialty Phone number   [name], MD Responsible Internal Medicine [phone]     Allergies  Allergen Reactions  . Ciprofloxacin   . Bactrim Rash  . Codeine Nausea Only   Medication Sig  acetic acid-hydrocortisone (VOSOL-HC) otic solution Place 3 drops into the right ear 3 (three) times daily. For seven days.  Ascorbic Acid (VITAMIN C PO) Take 1 capsule by mouth daily.  atenolol (TENORMIN) 25 MG tablet Take 1 tablet (25 mg total) by mouth 4 (four) times daily as needed. MAY TAKE 1 TABLET 4 TIMES DAILY AS NEEDED.  AZOPT 1 % ophthalmic suspension Place 1 drop into both eyes 2 (two) times daily.  budesonide-formoterol (SYMBICORT) 160-4.5 MCG/ACT inhaler Inhale 2 puffs into the lungs 2 (two) times daily. Rinse mouth after each use  Cholecalciferol (VITAMIN D PO) Take 1 capsule by mouth daily.  clotrimazole (LOTRIMIN) 1 % cream Apply 1 application 2 (two) times daily topically. Approximately 1 gram to the affected area.  COMBIGAN 0.2-0.5 % ophthalmic solution Place 1 drop into both eyes 2 (two) times daily.  cyanocobalamin 1000 MCG tablet Take 1,000 mcg by mouth daily.  diltiazem (CARDIZEM CD)  120 MG 24 hr capsule TAKE ONE (1) CAPSULE BY MOUTH 2 TIMES DAILY  FLOVENT HFA 44 MCG/ACT inhaler INHALE 1 PUFF TWICE A DAY DAILY  furosemide (LASIX) 20 MG tablet TAKE 1 TABLET (20 MG TOTAL) BY MOUTH DAILY.  HYDROcodone-acetaminophen (NORCO) 10-325 MG tablet Take 1 tablet by mouth every 6 (six) hours as needed for severe pain.  levalbuterol (XOPENEX) 0.63 MG/3ML nebulizer solution Take 3 mLs (0.63 mg total) by nebulization every 8 (eight) hours as needed for wheezing.  meclizine (ANTIVERT) 25 MG tablet Take 1 tablet (25 mg total) by mouth 2 (two) times daily as needed for dizziness. TAKE ONE TABLET 2-3 TIMES A DAY AS NEEDED FOR VERTIGO  Multiple Vitamin (MULTIVITAMIN WITH MINERALS) TABS tablet Take 1 tablet by mouth daily.  Nystatin ([place]) 100000 UNIT/GM POWD Apply topically.  predniSONE (DELTASONE) 10 MG tablet 4 tabs x 2 days, 3 tabs x 2 days, 2 tabs x 2 days, 1 tab x 2 days.  Respiratory Therapy Supplies (FLUTTER) DEVI Blow through 4 times per set, three sets daily  warfarin (COUMADIN) 2.5 MG tablet Take 1 tablet (2.5 mg total) by mouth daily for 1 dose.   Past Medical History:  Diagnosis Date  . Arthritis   . Cataract   . Chronic respiratory failure ([place])   . Endometriosis   . HTN (hypertension)   . Hx of echocardiogram    Echo (8/15):  Mild LVH, EF 65%, no  RWMA, Ao sclerosis, no AS, mod MAC, mild LAE, normal RVSF, PASP 40 mmHg  . Hx pulmonary embolism    on chronic coumadin  . Obesities, morbid ([place])   . OSA (obstructive sleep apnea)   . Osteoporosis   . PAF (paroxysmal atrial fibrillation) ([place])    Event Monitor (8/15):  Frequent PACs, brief bursts of nonsustained ATach; no AFib  . Psoriasis   . Skin cancer   . Vertigo    Social History   Socioeconomic History  . Marital status: Widowed    Spouse name: Not on file  . Number of children: Not on file  . Years of education: Not on file  . Highest education level: Not on file  Social Needs  . Financial resource strain: Not  on file  . Food insecurity - worry: Not on file  . Food insecurity - inability: Not on file  . Transportation needs - medical: Not on file  . Transportation needs - non-medical: Not on file  Occupational History  . Not on file  Tobacco Use  . Smoking status: Former Smoker    Packs/day: 1.00    Years: 2.00    Pack years: 2.00    Types: Cigarettes    Last attempt to quit: 04/03/1981    Years since quitting: 36.0  . Smokeless tobacco: Never Used  Substance and Sexual Activity  . Alcohol use: No  . Drug use: No  . Sexual activity: Not Currently  Other Topics Concern  . Not on file  Social History Narrative   She lives with her sister in [place] and requires assistance at home. Has an aid who visits and helps at home often. Son works as a Catering manager and also visits and helps when he can. Semi-independent in ADLs, requires walker around home, dependent in IADLs.   Family History  Problem Relation Age of Onset  . Heart attack Father   . Heart attack Brother   . Stroke Neg Hx    ASSESSMENT Recent Results: Lab Results  Component Value Date   INR 2.0 04/30/2017   INR 2.9 04/24/2017   INR 2.3 04/16/2017   Anticoagulation Dosing: 2.5 mg daily   INR today: Therapeutic  PLAN Weekly dose was unchanged  Patient Instructions  Patient educated about medication as defined in this encounter and verbalized understanding by repeating back instructions provided.   Patient advised to contact clinic or seek medical attention if signs/symptoms of bleeding or thromboembolism occur.  Patient verbalized understanding by repeating back information and was advised to contact me if further medication-related questions arise. Patient was also provided an information handout.  Follow-up 1-week  Jacqueline Nguyen

## 2017-04-30 NOTE — Progress Notes (Signed)
I have reviewed Dr. Julianne Rice note.  Patient with INR which is controlled.  Dosage unchanged.

## 2017-04-30 NOTE — Patient Instructions (Signed)
Patient educated about medication as defined in this encounter and verbalized understanding by repeating back instructions provided.

## 2017-05-03 ENCOUNTER — Ambulatory Visit (HOSPITAL_COMMUNITY): Payer: Medicare Other

## 2017-05-04 LAB — PROTIME-INR

## 2017-05-07 ENCOUNTER — Encounter: Payer: Self-pay | Admitting: Pharmacist

## 2017-05-07 DIAGNOSIS — Z7901 Long term (current) use of anticoagulants: Secondary | ICD-10-CM

## 2017-05-07 DIAGNOSIS — Z86711 Personal history of pulmonary embolism: Secondary | ICD-10-CM

## 2017-05-07 DIAGNOSIS — G4733 Obstructive sleep apnea (adult) (pediatric): Secondary | ICD-10-CM | POA: Diagnosis not present

## 2017-05-07 DIAGNOSIS — I480 Paroxysmal atrial fibrillation: Secondary | ICD-10-CM | POA: Diagnosis not present

## 2017-05-07 DIAGNOSIS — Z86718 Personal history of other venous thrombosis and embolism: Secondary | ICD-10-CM

## 2017-05-07 LAB — POCT INR: INR: 2.8

## 2017-05-07 NOTE — Progress Notes (Signed)
Anticoagulation Management Jacqueline Nguyen a 84 y.o.femalewho is onwarfarintreatment.   Indication: DVT and PEhistory, paroxysmal atrial fibrillation Duration: indefinite Supervising physician: Jacqueline Nguyen  Patient reports no signs or symptoms of bleeding or thromboembolism.  Anticoagulation Episode Summary    Current INR goal:   2.0-3.0  TTR:   73.0 % (9.4 mo)  Next INR check:   05/07/2017  INR from last check:   2.8 (05/07/2017)  Weekly max warfarin dose:     Target end date:     INR check location:     Preferred lab:     Send INR reminders to:      Indications   History of DVT (deep vein thrombosis) [Z86.718] Anticoagulated on Coumadin [Z51.81 Z79.01] History of pulmonary embolism [Z86.711]       Comments:         Anticoagulation Care Providers    Provider Role Specialty Phone number   Jacqueline Falcon, MD Responsible Internal Medicine [phone]     Allergies  Allergen Reactions  . Ciprofloxacin   . Bactrim Rash  . Codeine Nausea Only   Medication Sig  acetic acid-hydrocortisone (VOSOL-HC) otic solution Place 3 drops into the right ear 3 (three) times daily. For seven days.  Ascorbic Acid (VITAMIN C PO) Take 1 capsule by mouth daily.  atenolol (TENORMIN) 25 MG tablet Take 1 tablet (25 mg total) by mouth 4 (four) times daily as needed. MAY TAKE 1 TABLET 4 TIMES DAILY AS NEEDED.  AZOPT 1 % ophthalmic suspension Place 1 drop into both eyes 2 (two) times daily.  budesonide-formoterol (SYMBICORT) 160-4.5 MCG/ACT inhaler Inhale 2 puffs into the lungs 2 (two) times daily. Rinse mouth after each use  Cholecalciferol (VITAMIN D PO) Take 1 capsule by mouth daily.  clotrimazole (LOTRIMIN) 1 % cream Apply 1 application 2 (two) times daily topically. Approximately 1 gram to the affected area.  COMBIGAN 0.2-0.5 % ophthalmic solution Place 1 drop into both eyes 2 (two) times daily.  cyanocobalamin 1000 MCG tablet Take 1,000 mcg by mouth daily.  diltiazem (CARDIZEM CD)  120 MG 24 hr capsule TAKE ONE (1) CAPSULE BY MOUTH 2 TIMES DAILY  FLOVENT HFA 44 MCG/ACT inhaler INHALE 1 PUFF TWICE A DAY DAILY  furosemide (LASIX) 20 MG tablet TAKE 1 TABLET (20 MG TOTAL) BY MOUTH DAILY.  HYDROcodone-acetaminophen (NORCO) 10-325 MG tablet Take 1 tablet by mouth every 6 (six) hours as needed for severe pain.  levalbuterol (XOPENEX) 0.63 MG/3ML nebulizer solution Take 3 mLs (0.63 mg total) by nebulization every 8 (eight) hours as needed for wheezing.  meclizine (ANTIVERT) 25 MG tablet Take 1 tablet (25 mg total) by mouth 2 (two) times daily as needed for dizziness. TAKE ONE TABLET 2-3 TIMES A DAY AS NEEDED FOR VERTIGO  Multiple Vitamin (MULTIVITAMIN WITH MINERALS) TABS tablet Take 1 tablet by mouth daily.  Nystatin (Jacqueline Nguyen) 100000 UNIT/GM POWD Apply topically.  predniSONE (DELTASONE) 10 MG tablet 4 tabs x 2 days, 3 tabs x 2 days, 2 tabs x 2 days, 1 tab x 2 days.  Respiratory Therapy Supplies (FLUTTER) DEVI Blow through 4 times per set, three sets daily  warfarin (COUMADIN) 2.5 MG tablet Take 1 tablet (2.5 mg total) by mouth daily for 1 dose.   Past Medical History:  Diagnosis Date  . Arthritis   . Cataract   . Chronic respiratory failure ([place])   . Endometriosis   . HTN (hypertension)   . Hx of echocardiogram    Echo (8/15):  Mild LVH, EF 65%, no  RWMA, Ao sclerosis, no AS, mod MAC, mild LAE, normal RVSF, PASP 40 mmHg  . Hx pulmonary embolism    on chronic coumadin  . Obesities, morbid ([place])   . OSA (obstructive sleep apnea)   . Osteoporosis   . PAF (paroxysmal atrial fibrillation) ([place])    Event Monitor (8/15):  Frequent PACs, brief bursts of nonsustained ATach; no AFib  . Psoriasis   . Skin cancer   . Vertigo    Social History   Socioeconomic History  . Marital status: Widowed    Spouse name: Not on file  . Number of children: Not on file  . Years of education: Not on file  . Highest education level: Not on file  Social Needs  . Financial resource strain: Not  on file  . Food insecurity - worry: Not on file  . Food insecurity - inability: Not on file  . Transportation needs - medical: Not on file  . Transportation needs - non-medical: Not on file  Occupational History  . Not on file  Tobacco Use  . Smoking status: Former Smoker    Packs/day: 1.00    Years: 2.00    Pack years: 2.00    Types: Cigarettes    Last attempt to quit: 04/03/1981    Years since quitting: 36.1  . Smokeless tobacco: Never Used  Substance and Sexual Activity  . Alcohol use: No  . Drug use: No  . Sexual activity: Not Currently  Other Topics Concern  . Not on file  Social History Narrative   She lives with her sister in [place] and requires assistance at home. Has an aid who visits and helps at home often. Son works as a Catering manager and also visits and helps when he can. Semi-independent in ADLs, requires walker around home, dependent in IADLs.   Family History  Problem Relation Age of Onset  . Heart attack Father   . Heart attack Brother   . Stroke Neg Hx    ASSESSMENT Recent Results: Lab Results  Component Value Date   INR 2.8 05/07/2017   INR 2.0 04/30/2017   INR 2.9 04/24/2017   Anticoagulation Dosing: 2.5 mg daily   INR today: Therapeutic  PLAN Weekly dose was unchanged   Patient Instructions  Patient educated about medication as defined in this encounter and verbalized understanding by repeating back instructions provided.    Patient advised to contact clinic or seek medical attention if signs/symptoms of bleeding or thromboembolism occur.  Patient verbalized understanding by repeating back information and was advised to contact me if further medication-related questions arise. Patient was also provided an information handout.  Follow-up Return in about 1 week (around 05/14/2017).  Jacqueline Nguyen

## 2017-05-07 NOTE — Patient Instructions (Signed)
Patient educated about medication as defined in this encounter and verbalized understanding by repeating back instructions provided.

## 2017-05-09 NOTE — Progress Notes (Signed)
I reviewed Dr. Julianne Rice note.

## 2017-05-10 ENCOUNTER — Telehealth: Payer: Self-pay | Admitting: Internal Medicine

## 2017-05-13 DIAGNOSIS — G4733 Obstructive sleep apnea (adult) (pediatric): Secondary | ICD-10-CM | POA: Diagnosis not present

## 2017-05-14 ENCOUNTER — Encounter: Payer: Self-pay | Admitting: Pharmacist

## 2017-05-14 DIAGNOSIS — L821 Other seborrheic keratosis: Secondary | ICD-10-CM | POA: Diagnosis not present

## 2017-05-14 DIAGNOSIS — C44519 Basal cell carcinoma of skin of other part of trunk: Secondary | ICD-10-CM | POA: Diagnosis not present

## 2017-05-14 DIAGNOSIS — C44612 Basal cell carcinoma of skin of right upper limb, including shoulder: Secondary | ICD-10-CM | POA: Diagnosis not present

## 2017-05-14 DIAGNOSIS — L570 Actinic keratosis: Secondary | ICD-10-CM | POA: Diagnosis not present

## 2017-05-14 DIAGNOSIS — C44311 Basal cell carcinoma of skin of nose: Secondary | ICD-10-CM | POA: Diagnosis not present

## 2017-05-14 DIAGNOSIS — Z86711 Personal history of pulmonary embolism: Secondary | ICD-10-CM

## 2017-05-14 DIAGNOSIS — Z86718 Personal history of other venous thrombosis and embolism: Secondary | ICD-10-CM

## 2017-05-14 DIAGNOSIS — D485 Neoplasm of uncertain behavior of skin: Secondary | ICD-10-CM | POA: Diagnosis not present

## 2017-05-14 DIAGNOSIS — Z7901 Long term (current) use of anticoagulants: Secondary | ICD-10-CM

## 2017-05-14 DIAGNOSIS — D0439 Carcinoma in situ of skin of other parts of face: Secondary | ICD-10-CM | POA: Diagnosis not present

## 2017-05-14 LAB — POCT INR: INR: 2

## 2017-05-15 NOTE — Patient Instructions (Signed)
Patient educated about medication as defined in this encounter and verbalized understanding by repeating back instructions provided.   

## 2017-05-15 NOTE — Progress Notes (Signed)
Anticoagulation Management Jacqueline Nguyen a 84 y.o.femalewho is onwarfarintreatment.   Indication: DVT and PEhistory, paroxysmal atrial fibrillation Duration: indefinite Supervising physician: Gilles Chiquito  Patient reports no signs or symptoms of bleeding or thromboembolism.  Anticoagulation Episode Summary    Current INR goal:   2.0-3.0  TTR:   73.6 % (9.6 mo)  Next INR check:   05/21/2017  INR from last check:   2.0 (05/14/2017)  Weekly max warfarin dose:     Target end date:     INR check location:     Preferred lab:     Send INR reminders to:      Indications   History of DVT (deep vein thrombosis) [Z86.718] Anticoagulated on Coumadin [Z51.81 Z79.01] History of pulmonary embolism [Z86.711]       Comments:         Anticoagulation Care Providers    Provider Role Specialty Phone number   [name], MD Responsible Internal Medicine [phone]     Allergies  Allergen Reactions  . Ciprofloxacin   . Bactrim Rash  . Codeine Nausea Only   Medication Sig  acetic acid-hydrocortisone (VOSOL-HC) otic solution Place 3 drops into the right ear 3 (three) times daily. For seven days.  Ascorbic Acid (VITAMIN C PO) Take 1 capsule by mouth daily.  atenolol (TENORMIN) 25 MG tablet Take 1 tablet (25 mg total) by mouth 4 (four) times daily as needed. MAY TAKE 1 TABLET 4 TIMES DAILY AS NEEDED.  AZOPT 1 % ophthalmic suspension Place 1 drop into both eyes 2 (two) times daily.  budesonide-formoterol (SYMBICORT) 160-4.5 MCG/ACT inhaler Inhale 2 puffs into the lungs 2 (two) times daily. Rinse mouth after each use  Cholecalciferol (VITAMIN D PO) Take 1 capsule by mouth daily.  clotrimazole (LOTRIMIN) 1 % cream Apply 1 application 2 (two) times daily topically. Approximately 1 gram to the affected area.  COMBIGAN 0.2-0.5 % ophthalmic solution Place 1 drop into both eyes 2 (two) times daily.  cyanocobalamin 1000 MCG tablet Take 1,000 mcg by mouth daily.  diltiazem (CARDIZEM CD)  120 MG 24 hr capsule TAKE ONE (1) CAPSULE BY MOUTH 2 TIMES DAILY  FLOVENT HFA 44 MCG/ACT inhaler INHALE 1 PUFF TWICE A DAY DAILY  furosemide (LASIX) 20 MG tablet TAKE 1 TABLET (20 MG TOTAL) BY MOUTH DAILY.  HYDROcodone-acetaminophen (NORCO) 10-325 MG tablet Take 1 tablet by mouth every 6 (six) hours as needed for severe pain.  levalbuterol (XOPENEX) 0.63 MG/3ML nebulizer solution Take 3 mLs (0.63 mg total) by nebulization every 8 (eight) hours as needed for wheezing.  meclizine (ANTIVERT) 25 MG tablet Take 1 tablet (25 mg total) by mouth 2 (two) times daily as needed for dizziness. TAKE ONE TABLET 2-3 TIMES A DAY AS NEEDED FOR VERTIGO  Multiple Vitamin (MULTIVITAMIN WITH MINERALS) TABS tablet Take 1 tablet by mouth daily.  Nystatin ([place]) 100000 UNIT/GM POWD Apply topically.  predniSONE (DELTASONE) 10 MG tablet 4 tabs x 2 days, 3 tabs x 2 days, 2 tabs x 2 days, 1 tab x 2 days.  Respiratory Therapy Supplies (FLUTTER) DEVI Blow through 4 times per set, three sets daily  warfarin (COUMADIN) 2.5 MG tablet Take 1 tablet (2.5 mg total) by mouth daily for 1 dose.   Past Medical History:  Diagnosis Date  . Arthritis   . Cataract   . Chronic respiratory failure ([place])   . Endometriosis   . HTN (hypertension)   . Hx of echocardiogram    Echo (8/15):  Mild LVH, EF 65%, no  RWMA, Ao sclerosis, no AS, mod MAC, mild LAE, normal RVSF, PASP 40 mmHg  . Hx pulmonary embolism    on chronic coumadin  . Obesities, morbid ([place])   . OSA (obstructive sleep apnea)   . Osteoporosis   . PAF (paroxysmal atrial fibrillation) ([place])    Event Monitor (8/15):  Frequent PACs, brief bursts of nonsustained ATach; no AFib  . Psoriasis   . Skin cancer   . Vertigo    Social History   Socioeconomic History  . Marital status: Widowed    Spouse name: Not on file  . Number of children: Not on file  . Years of education: Not on file  . Highest education level: Not on file  Social Needs  . Financial resource strain: Not  on file  . Food insecurity - worry: Not on file  . Food insecurity - inability: Not on file  . Transportation needs - medical: Not on file  . Transportation needs - non-medical: Not on file  Occupational History  . Not on file  Tobacco Use  . Smoking status: Former Smoker    Packs/day: 1.00    Years: 2.00    Pack years: 2.00    Types: Cigarettes    Last attempt to quit: 04/03/1981    Years since quitting: 36.1  . Smokeless tobacco: Never Used  Substance and Sexual Activity  . Alcohol use: No  . Drug use: No  . Sexual activity: Not Currently  Other Topics Concern  . Not on file  Social History Narrative   She lives with her sister in [place] and requires assistance at home. Has an aid who visits and helps at home often. Son works as a Catering manager and also visits and helps when he can. Semi-independent in ADLs, requires walker around home, dependent in IADLs.   Family History  Problem Relation Age of Onset  . Heart attack Father   . Heart attack Brother   . Stroke Neg Hx    ASSESSMENT Recent Results: Lab Results  Component Value Date   INR 2.0 05/14/2017   INR 2.8 05/07/2017   INR 2.0 04/30/2017   Anticoagulation Dosing:   INR today: Therapeutic  PLAN Weekly dose was unchanged   Patient Instructions  Patient educated about medication as defined in this encounter and verbalized understanding by repeating back instructions provided.    Patient advised to contact clinic or seek medical attention if signs/symptoms of bleeding or thromboembolism occur.  Patient verbalized understanding by repeating back information and was advised to contact me if further medication-related questions arise. Patient was also provided an information handout.  Follow-up 1 week  [name]

## 2017-05-18 LAB — PROTIME-INR

## 2017-05-21 ENCOUNTER — Encounter: Payer: Self-pay | Admitting: Pharmacist

## 2017-05-21 DIAGNOSIS — Z7901 Long term (current) use of anticoagulants: Secondary | ICD-10-CM

## 2017-05-21 DIAGNOSIS — Z86718 Personal history of other venous thrombosis and embolism: Secondary | ICD-10-CM

## 2017-05-21 DIAGNOSIS — Z86711 Personal history of pulmonary embolism: Secondary | ICD-10-CM

## 2017-05-21 LAB — POCT INR: INR: 2.2

## 2017-05-22 DIAGNOSIS — J449 Chronic obstructive pulmonary disease, unspecified: Secondary | ICD-10-CM | POA: Diagnosis not present

## 2017-05-23 NOTE — Patient Instructions (Signed)
Patient educated about medication as defined in this encounter and verbalized understanding by repeating back instructions provided.

## 2017-05-23 NOTE — Progress Notes (Signed)
Anticoagulation Management Jacqueline Nguyen a 85 y.o.femalewho is onwarfarintreatment.   Indication: DVT and PEhistory, paroxysmal atrial fibrillation Duration: indefinite Supervising physician: Gilles Chiquito  Patient reports no signs or symptoms of bleeding or thromboembolism.  Anticoagulation Episode Summary    Current INR goal:   2.0-3.0  TTR:   74.3 % (9.9 mo)  Next INR check:   05/28/2017  INR from last check:   2.2 (05/21/2017)  Weekly max warfarin dose:     Target end date:     INR check location:     Preferred lab:     Send INR reminders to:      Indications   History of DVT (deep vein thrombosis) [Z86.718] Anticoagulated on Coumadin [Z51.81 Z79.01] History of pulmonary embolism [Z86.711]       Comments:         Anticoagulation Care Providers    Provider Role Specialty Phone number   [name], MD Responsible Internal Medicine [phone]      Allergies  Allergen Reactions  . Ciprofloxacin   . Bactrim Rash  . Codeine Nausea Only   Medication Sig  acetic acid-hydrocortisone (VOSOL-HC) otic solution Place 3 drops into the right ear 3 (three) times daily. For seven days.  Ascorbic Acid (VITAMIN C PO) Take 1 capsule by mouth daily.  atenolol (TENORMIN) 25 MG tablet Take 1 tablet (25 mg total) by mouth 4 (four) times daily as needed. MAY TAKE 1 TABLET 4 TIMES DAILY AS NEEDED.  AZOPT 1 % ophthalmic suspension Place 1 drop into both eyes 2 (two) times daily.  budesonide-formoterol (SYMBICORT) 160-4.5 MCG/ACT inhaler Inhale 2 puffs into the lungs 2 (two) times daily. Rinse mouth after each use  Cholecalciferol (VITAMIN D PO) Take 1 capsule by mouth daily.  clotrimazole (LOTRIMIN) 1 % cream Apply 1 application 2 (two) times daily topically. Approximately 1 gram to the affected area.  COMBIGAN 0.2-0.5 % ophthalmic solution Place 1 drop into both eyes 2 (two) times daily.  cyanocobalamin 1000 MCG tablet Take 1,000 mcg by mouth daily.  diltiazem (CARDIZEM  CD) 120 MG 24 hr capsule TAKE ONE (1) CAPSULE BY MOUTH 2 TIMES DAILY  FLOVENT HFA 44 MCG/ACT inhaler INHALE 1 PUFF TWICE A DAY DAILY  furosemide (LASIX) 20 MG tablet TAKE 1 TABLET (20 MG TOTAL) BY MOUTH DAILY.  HYDROcodone-acetaminophen (NORCO) 10-325 MG tablet Take 1 tablet by mouth every 6 (six) hours as needed for severe pain.  levalbuterol (XOPENEX) 0.63 MG/3ML nebulizer solution Take 3 mLs (0.63 mg total) by nebulization every 8 (eight) hours as needed for wheezing.  meclizine (ANTIVERT) 25 MG tablet Take 1 tablet (25 mg total) by mouth 2 (two) times daily as needed for dizziness. TAKE ONE TABLET 2-3 TIMES A DAY AS NEEDED FOR VERTIGO  Multiple Vitamin (MULTIVITAMIN WITH MINERALS) TABS tablet Take 1 tablet by mouth daily.  Nystatin ([place]) 100000 UNIT/GM POWD Apply topically.  predniSONE (DELTASONE) 10 MG tablet 4 tabs x 2 days, 3 tabs x 2 days, 2 tabs x 2 days, 1 tab x 2 days.  Respiratory Therapy Supplies (FLUTTER) DEVI Blow through 4 times per set, three sets daily  warfarin (COUMADIN) 2.5 MG tablet Take 1 tablet (2.5 mg total) by mouth daily for 1 dose.   Past Medical History:  Diagnosis Date  . Arthritis   . Cataract   . Chronic respiratory failure ([place])   . Endometriosis   . HTN (hypertension)   . Hx of echocardiogram    Echo (8/15):  Mild LVH, EF 65%,  no RWMA, Ao sclerosis, no AS, mod MAC, mild LAE, normal RVSF, PASP 40 mmHg  . Hx pulmonary embolism    on chronic coumadin  . Obesities, morbid ([place])   . OSA (obstructive sleep apnea)   . Osteoporosis   . PAF (paroxysmal atrial fibrillation) ([place])    Event Monitor (8/15):  Frequent PACs, brief bursts of nonsustained ATach; no AFib  . Psoriasis   . Skin cancer   . Vertigo    Social History   Socioeconomic History  . Marital status: Widowed    Spouse name: Not on file  . Number of children: Not on file  . Years of education: Not on file  . Highest education level: Not on file  Social Needs  . Financial resource strain:  Not on file  . Food insecurity - worry: Not on file  . Food insecurity - inability: Not on file  . Transportation needs - medical: Not on file  . Transportation needs - non-medical: Not on file  Occupational History  . Not on file  Tobacco Use  . Smoking status: Former Smoker    Packs/day: 1.00    Years: 2.00    Pack years: 2.00    Types: Cigarettes    Last attempt to quit: 04/03/1981    Years since quitting: 36.1  . Smokeless tobacco: Never Used  Substance and Sexual Activity  . Alcohol use: No  . Drug use: No  . Sexual activity: Not Currently  Other Topics Concern  . Not on file  Social History Narrative   She lives with her sister in [place] and requires assistance at home. Has an aid who visits and helps at home often. Son works as a Catering manager and also visits and helps when he can. Semi-independent in ADLs, requires walker around home, dependent in IADLs.   Family History  Problem Relation Age of Onset  . Heart attack Father   . Heart attack Brother   . Stroke Neg Hx    ASSESSMENT Recent Results: Lab Results  Component Value Date   INR 2.2 05/21/2017   INR 2.0 05/14/2017   INR 2.8 05/07/2017   Anticoagulation Dosing:   INR today: Therapeutic  PLAN Weekly dose was unchanged  Patient Instructions  Patient educated about medication as defined in this encounter and verbalized understanding by repeating back instructions provided.   Patient advised to contact clinic or seek medical attention if signs/symptoms of bleeding or thromboembolism occur.  Patient verbalized understanding by repeating back information and was advised to contact me if further medication-related questions arise. Patient was also provided an information handout.  Follow-up Return in about 1 week (around 05/28/2017).  Jacqueline Nguyen

## 2017-05-24 NOTE — Progress Notes (Signed)
I have reviewed Dr. Julianne Rice note and agree with plan.

## 2017-05-28 ENCOUNTER — Encounter: Payer: Self-pay | Admitting: Pharmacist

## 2017-05-28 DIAGNOSIS — Z86711 Personal history of pulmonary embolism: Secondary | ICD-10-CM

## 2017-05-28 DIAGNOSIS — Z86718 Personal history of other venous thrombosis and embolism: Secondary | ICD-10-CM

## 2017-05-28 DIAGNOSIS — Z7901 Long term (current) use of anticoagulants: Secondary | ICD-10-CM

## 2017-05-28 LAB — POCT INR: INR: 2.1

## 2017-05-29 NOTE — Progress Notes (Signed)
Anticoagulation Management Jacqueline Nguyen a 84 y.o.femalewho is onwarfarintreatment.   Indication: DVT and PEhistory, paroxysmal atrial fibrillation Duration: indefinite Supervising physician: Gilles Chiquito  Patient reports no signs or symptoms of bleeding or thromboembolism.  Anticoagulation Episode Summary    Current INR goal:   2.0-3.0  TTR:   74.8 % (10.1 mo)  Next INR check:   05/28/2017  INR from last check:   2.1 (05/28/2017)  Weekly max warfarin dose:     Target end date:     INR check location:     Preferred lab:     Send INR reminders to:      Indications   History of DVT (deep vein thrombosis) [Z86.718] Anticoagulated on Coumadin [Z51.81 Z79.01] History of pulmonary embolism [Z86.711]       Comments:         Anticoagulation Care Providers    Provider Role Specialty Phone number   [name], MD Responsible Internal Medicine [phone]     Allergies  Allergen Reactions  . Ciprofloxacin   . Bactrim Rash  . Codeine Nausea Only   Medication Sig  acetic acid-hydrocortisone (VOSOL-HC) otic solution Place 3 drops into the right ear 3 (three) times daily. For seven days.  Ascorbic Acid (VITAMIN C PO) Take 1 capsule by mouth daily.  atenolol (TENORMIN) 25 MG tablet Take 1 tablet (25 mg total) by mouth 4 (four) times daily as needed. MAY TAKE 1 TABLET 4 TIMES DAILY AS NEEDED.  AZOPT 1 % ophthalmic suspension Place 1 drop into both eyes 2 (two) times daily.  budesonide-formoterol (SYMBICORT) 160-4.5 MCG/ACT inhaler Inhale 2 puffs into the lungs 2 (two) times daily. Rinse mouth after each use  Cholecalciferol (VITAMIN D PO) Take 1 capsule by mouth daily.  clotrimazole (LOTRIMIN) 1 % cream Apply 1 application 2 (two) times daily topically. Approximately 1 gram to the affected area.  COMBIGAN 0.2-0.5 % ophthalmic solution Place 1 drop into both eyes 2 (two) times daily.  cyanocobalamin 1000 MCG tablet Take 1,000 mcg by mouth daily.  diltiazem (CARDIZEM CD)  120 MG 24 hr capsule TAKE ONE (1) CAPSULE BY MOUTH 2 TIMES DAILY  FLOVENT HFA 44 MCG/ACT inhaler INHALE 1 PUFF TWICE A DAY DAILY  furosemide (LASIX) 20 MG tablet TAKE 1 TABLET (20 MG TOTAL) BY MOUTH DAILY.  HYDROcodone-acetaminophen (NORCO) 10-325 MG tablet Take 1 tablet by mouth every 6 (six) hours as needed for severe pain.  levalbuterol (XOPENEX) 0.63 MG/3ML nebulizer solution Take 3 mLs (0.63 mg total) by nebulization every 8 (eight) hours as needed for wheezing.  meclizine (ANTIVERT) 25 MG tablet Take 1 tablet (25 mg total) by mouth 2 (two) times daily as needed for dizziness. TAKE ONE TABLET 2-3 TIMES A DAY AS NEEDED FOR VERTIGO  Multiple Vitamin (MULTIVITAMIN WITH MINERALS) TABS tablet Take 1 tablet by mouth daily.  Nystatin ([place]) 100000 UNIT/GM POWD Apply topically.  predniSONE (DELTASONE) 10 MG tablet 4 tabs x 2 days, 3 tabs x 2 days, 2 tabs x 2 days, 1 tab x 2 days.  Respiratory Therapy Supplies (FLUTTER) DEVI Blow through 4 times per set, three sets daily  warfarin (COUMADIN) 2.5 MG tablet Take 1 tablet (2.5 mg total) by mouth daily for 1 dose.   Past Medical History:  Diagnosis Date  . Arthritis   . Cataract   . Chronic respiratory failure ([place])   . Endometriosis   . HTN (hypertension)   . Hx of echocardiogram    Echo (8/15):  Mild LVH, EF 65%, no  RWMA, Ao sclerosis, no AS, mod MAC, mild LAE, normal RVSF, PASP 40 mmHg  . Hx pulmonary embolism    on chronic coumadin  . Obesities, morbid ([place])   . OSA (obstructive sleep apnea)   . Osteoporosis   . PAF (paroxysmal atrial fibrillation) ([place])    Event Monitor (8/15):  Frequent PACs, brief bursts of nonsustained ATach; no AFib  . Psoriasis   . Skin cancer   . Vertigo    Social History   Socioeconomic History  . Marital status: Widowed    Spouse name: Not on file  . Number of children: Not on file  . Years of education: Not on file  . Highest education level: Not on file  Social Needs  . Financial resource strain: Not  on file  . Food insecurity - worry: Not on file  . Food insecurity - inability: Not on file  . Transportation needs - medical: Not on file  . Transportation needs - non-medical: Not on file  Occupational History  . Not on file  Tobacco Use  . Smoking status: Former Smoker    Packs/day: 1.00    Years: 2.00    Pack years: 2.00    Types: Cigarettes    Last attempt to quit: 04/03/1981    Years since quitting: 36.1  . Smokeless tobacco: Never Used  Substance and Sexual Activity  . Alcohol use: No  . Drug use: No  . Sexual activity: Not Currently  Other Topics Concern  . Not on file  Social History Narrative   Jacqueline Nguyen lives with her sister in [place] and requires assistance at home. Has an aid who visits and helps at home often. Son works as a Catering manager and also visits and helps when he can. Semi-independent in ADLs, requires walker around home, dependent in IADLs.   Family History  Problem Relation Age of Onset  . Heart attack Father   . Heart attack Brother   . Stroke Neg Hx    ASSESSMENT Recent Results: Lab Results  Component Value Date   INR 2.1 05/28/2017   INR 2.2 05/21/2017   INR 2.0 05/14/2017   Anticoagulation Dosing: 2.5 mg daily   INR today: Therapeutic  PLAN Weekly dose was unchanged  There are no Patient Instructions on file for this visit. Patient advised to contact clinic or seek medical attention if signs/symptoms of bleeding or thromboembolism occur.  Patient verbalized understanding by repeating back information and was advised to contact me if further medication-related questions arise. Patient was also provided an information handout.  Follow-up 1 week  [name]

## 2017-05-29 NOTE — Patient Instructions (Signed)
Patient educated about medication as defined in this encounter and verbalized understanding by repeating back instructions provided.

## 2017-05-30 ENCOUNTER — Telehealth: Payer: Self-pay | Admitting: Internal Medicine

## 2017-05-30 DIAGNOSIS — G4733 Obstructive sleep apnea (adult) (pediatric): Secondary | ICD-10-CM | POA: Diagnosis not present

## 2017-05-31 LAB — PROTIME-INR

## 2017-06-04 ENCOUNTER — Encounter: Payer: Self-pay | Admitting: Pharmacist

## 2017-06-04 DIAGNOSIS — Z7901 Long term (current) use of anticoagulants: Secondary | ICD-10-CM

## 2017-06-04 DIAGNOSIS — Z86711 Personal history of pulmonary embolism: Secondary | ICD-10-CM

## 2017-06-04 DIAGNOSIS — I480 Paroxysmal atrial fibrillation: Secondary | ICD-10-CM | POA: Diagnosis not present

## 2017-06-04 DIAGNOSIS — Z86718 Personal history of other venous thrombosis and embolism: Secondary | ICD-10-CM

## 2017-06-04 DIAGNOSIS — G4733 Obstructive sleep apnea (adult) (pediatric): Secondary | ICD-10-CM | POA: Diagnosis not present

## 2017-06-04 LAB — POCT INR: INR: 2

## 2017-06-04 NOTE — Patient Instructions (Signed)
Patient educated about medication as defined in this encounter and verbalized understanding by repeating back instructions provided.

## 2017-06-04 NOTE — Progress Notes (Signed)
Anticoagulation Management Jacqueline Nguyen a 84 y.o.femalewho is onwarfarintreatment.   Indication: DVT and PEhistory, paroxysmal atrial fibrillation Duration: indefinite Supervising physician: Gilles Chiquito  Patient reports no signs or symptoms of bleeding or thromboembolism.  Anticoagulation Episode Summary    Current INR goal:   2.0-3.0  TTR:   75.4 % (10.3 mo)  Next INR check:   06/11/2017  INR from last check:   2.0 (06/04/2017)  Weekly max warfarin dose:     Target end date:     INR check location:     Preferred lab:     Send INR reminders to:      Indications   History of DVT (deep vein thrombosis) [Z86.718] Anticoagulated on Coumadin [Z51.81 Z79.01] History of pulmonary embolism [Z86.711]       Comments:         Anticoagulation Care Providers    Provider Role Specialty Phone number   [name], MD Responsible Internal Medicine [phone]     Allergies  Allergen Reactions  . Ciprofloxacin   . Bactrim Rash  . Codeine Nausea Only   Medication Sig  acetic acid-hydrocortisone (VOSOL-HC) otic solution Place 3 drops into the right ear 3 (three) times daily. For seven days.  Ascorbic Acid (VITAMIN C PO) Take 1 capsule by mouth daily.  atenolol (TENORMIN) 25 MG tablet Take 1 tablet (25 mg total) by mouth 4 (four) times daily as needed. MAY TAKE 1 TABLET 4 TIMES DAILY AS NEEDED.  AZOPT 1 % ophthalmic suspension Place 1 drop into both eyes 2 (two) times daily.  budesonide-formoterol (SYMBICORT) 160-4.5 MCG/ACT inhaler Inhale 2 puffs into the lungs 2 (two) times daily. Rinse mouth after each use  Cholecalciferol (VITAMIN D PO) Take 1 capsule by mouth daily.  clotrimazole (LOTRIMIN) 1 % cream Apply 1 application 2 (two) times daily topically. Approximately 1 gram to the affected area.  COMBIGAN 0.2-0.5 % ophthalmic solution Place 1 drop into both eyes 2 (two) times daily.  cyanocobalamin 1000 MCG tablet Take 1,000 mcg by mouth daily.  diltiazem (CARDIZEM CD)  120 MG 24 hr capsule TAKE ONE (1) CAPSULE BY MOUTH 2 TIMES DAILY  FLOVENT HFA 44 MCG/ACT inhaler INHALE 1 PUFF TWICE A DAY DAILY  furosemide (LASIX) 20 MG tablet TAKE 1 TABLET (20 MG TOTAL) BY MOUTH DAILY.  HYDROcodone-acetaminophen (NORCO) 10-325 MG tablet Take 1 tablet by mouth every 6 (six) hours as needed for severe pain.  levalbuterol (XOPENEX) 0.63 MG/3ML nebulizer solution Take 3 mLs (0.63 mg total) by nebulization every 8 (eight) hours as needed for wheezing.  meclizine (ANTIVERT) 25 MG tablet Take 1 tablet (25 mg total) by mouth 2 (two) times daily as needed for dizziness. TAKE ONE TABLET 2-3 TIMES A DAY AS NEEDED FOR VERTIGO  Multiple Vitamin (MULTIVITAMIN WITH MINERALS) TABS tablet Take 1 tablet by mouth daily.  Nystatin ([place]) 100000 UNIT/GM POWD Apply topically.  predniSONE (DELTASONE) 10 MG tablet 4 tabs x 2 days, 3 tabs x 2 days, 2 tabs x 2 days, 1 tab x 2 days.  Respiratory Therapy Supplies (FLUTTER) DEVI Blow through 4 times per set, three sets daily  warfarin (COUMADIN) 2.5 MG tablet Take 1 tablet (2.5 mg total) by mouth daily for 1 dose.   Past Medical History:  Diagnosis Date  . Arthritis   . Cataract   . Chronic respiratory failure ([place])   . Endometriosis   . HTN (hypertension)   . Hx of echocardiogram    Echo (8/15):  Mild LVH, EF 65%, no  RWMA, Ao sclerosis, no AS, mod MAC, mild LAE, normal RVSF, PASP 40 mmHg  . Hx pulmonary embolism    on chronic coumadin  . Obesities, morbid ([place])   . OSA (obstructive sleep apnea)   . Osteoporosis   . PAF (paroxysmal atrial fibrillation) ([place])    Event Monitor (8/15):  Frequent PACs, brief bursts of nonsustained ATach; no AFib  . Psoriasis   . Skin cancer   . Vertigo    Social History   Socioeconomic History  . Marital status: Widowed    Spouse name: Not on file  . Number of children: Not on file  . Years of education: Not on file  . Highest education level: Not on file  Social Needs  . Financial resource strain: Not  on file  . Food insecurity - worry: Not on file  . Food insecurity - inability: Not on file  . Transportation needs - medical: Not on file  . Transportation needs - non-medical: Not on file  Occupational History  . Not on file  Tobacco Use  . Smoking status: Former Smoker    Packs/day: 1.00    Years: 2.00    Pack years: 2.00    Types: Cigarettes    Last attempt to quit: 04/03/1981    Years since quitting: 36.1  . Smokeless tobacco: Never Used  Substance and Sexual Activity  . Alcohol use: No  . Drug use: No  . Sexual activity: Not Currently  Other Topics Concern  . Not on file  Social History Narrative   She lives with her sister in [place] and requires assistance at home. Has an aid who visits and helps at home often. Son works as a Catering manager and also visits and helps when he can. Semi-independent in ADLs, requires walker around home, dependent in IADLs.   Family History  Problem Relation Age of Onset  . Heart attack Father   . Heart attack Brother   . Stroke Neg Hx    ASSESSMENT Recent Results: Lab Results  Component Value Date   INR 2.0 06/04/2017   INR 2.1 05/28/2017   INR 2.2 05/21/2017   Anticoagulation Dosing:   INR today: Therapeutic  PLAN Weekly dose was unchanged  There are no Patient Instructions on file for this visit. Patient advised to contact clinic or seek medical attention if signs/symptoms of bleeding or thromboembolism occur.  Patient verbalized understanding by repeating back information and was advised to contact me if further medication-related questions arise. Patient was also provided an information handout.  Follow-up 1 week  [name]

## 2017-06-04 NOTE — Addendum Note (Signed)
Addended by: Forde Dandy on: 06/04/2017 04:35 PM   Modules accepted: Level of Service

## 2017-06-06 DIAGNOSIS — C44311 Basal cell carcinoma of skin of nose: Secondary | ICD-10-CM | POA: Diagnosis not present

## 2017-06-06 LAB — PROTIME-INR

## 2017-06-10 DIAGNOSIS — G4733 Obstructive sleep apnea (adult) (pediatric): Secondary | ICD-10-CM | POA: Diagnosis not present

## 2017-06-11 ENCOUNTER — Encounter: Payer: Self-pay | Admitting: Pharmacist

## 2017-06-11 DIAGNOSIS — Z86718 Personal history of other venous thrombosis and embolism: Secondary | ICD-10-CM

## 2017-06-11 DIAGNOSIS — Z86711 Personal history of pulmonary embolism: Secondary | ICD-10-CM

## 2017-06-11 DIAGNOSIS — Z7901 Long term (current) use of anticoagulants: Secondary | ICD-10-CM

## 2017-06-11 LAB — POCT INR: INR: 1.7

## 2017-06-12 LAB — PROTIME-INR

## 2017-06-12 NOTE — Progress Notes (Signed)
Anticoagulation Management Jacqueline Nguyen a 84 y.o.femalewho is onwarfarintreatment.   Indication: DVT and PEhistory, paroxysmal atrial fibrillation Duration: indefinite Supervising physician: Gilles Chiquito  Patient reports no signs or symptoms of bleeding or thromboembolism.  Anticoagulation Episode Summary    Current INR goal:   2.0-3.0  TTR:   73.8 % (10.6 mo)  Next INR check:   06/18/2017  INR from last check:   1.7! (06/11/2017)  Weekly max warfarin dose:     Target end date:     INR check location:     Preferred lab:     Send INR reminders to:      Indications   History of DVT (deep vein thrombosis) [Z86.718] Anticoagulated on Coumadin [Z51.81 Z79.01] History of pulmonary embolism [Z86.711]       Comments:         Anticoagulation Care Providers    Provider Role Specialty Phone number   [name], MD Responsible Internal Medicine [phone]     Allergies  Allergen Reactions  . Ciprofloxacin   . Bactrim Rash  . Codeine Nausea Only   Medication Sig  acetic acid-hydrocortisone (VOSOL-HC) otic solution Place 3 drops into the right ear 3 (three) times daily. For seven days.  Ascorbic Acid (VITAMIN C PO) Take 1 capsule by mouth daily.  atenolol (TENORMIN) 25 MG tablet Take 1 tablet (25 mg total) by mouth 4 (four) times daily as needed. MAY TAKE 1 TABLET 4 TIMES DAILY AS NEEDED.  AZOPT 1 % ophthalmic suspension Place 1 drop into both eyes 2 (two) times daily.  budesonide-formoterol (SYMBICORT) 160-4.5 MCG/ACT inhaler Inhale 2 puffs into the lungs 2 (two) times daily. Rinse mouth after each use  Cholecalciferol (VITAMIN D PO) Take 1 capsule by mouth daily.  clotrimazole (LOTRIMIN) 1 % cream Apply 1 application 2 (two) times daily topically. Approximately 1 gram to the affected area.  COMBIGAN 0.2-0.5 % ophthalmic solution Place 1 drop into both eyes 2 (two) times daily.  cyanocobalamin 1000 MCG tablet Take 1,000 mcg by mouth daily.  diltiazem (CARDIZEM CD)  120 MG 24 hr capsule TAKE ONE (1) CAPSULE BY MOUTH 2 TIMES DAILY  FLOVENT HFA 44 MCG/ACT inhaler INHALE 1 PUFF TWICE A DAY DAILY  furosemide (LASIX) 20 MG tablet TAKE 1 TABLET (20 MG TOTAL) BY MOUTH DAILY.  HYDROcodone-acetaminophen (NORCO) 10-325 MG tablet Take 1 tablet by mouth every 6 (six) hours as needed for severe pain.  levalbuterol (XOPENEX) 0.63 MG/3ML nebulizer solution Take 3 mLs (0.63 mg total) by nebulization every 8 (eight) hours as needed for wheezing.  meclizine (ANTIVERT) 25 MG tablet Take 1 tablet (25 mg total) by mouth 2 (two) times daily as needed for dizziness. TAKE ONE TABLET 2-3 TIMES A DAY AS NEEDED FOR VERTIGO  Multiple Vitamin (MULTIVITAMIN WITH MINERALS) TABS tablet Take 1 tablet by mouth daily.  Nystatin ([place]) 100000 UNIT/GM POWD Apply topically.  predniSONE (DELTASONE) 10 MG tablet 4 tabs x 2 days, 3 tabs x 2 days, 2 tabs x 2 days, 1 tab x 2 days.  Respiratory Therapy Supplies (FLUTTER) DEVI Blow through 4 times per set, three sets daily  warfarin (COUMADIN) 2.5 MG tablet Take 1 tablet (2.5 mg total) by mouth daily for 1 dose.   Past Medical History:  Diagnosis Date  . Arthritis   . Cataract   . Chronic respiratory failure ([place])   . Endometriosis   . HTN (hypertension)   . Hx of echocardiogram    Echo (8/15):  Mild LVH, EF 65%, no  RWMA, Ao sclerosis, no AS, mod MAC, mild LAE, normal RVSF, PASP 40 mmHg  . Hx pulmonary embolism    on chronic coumadin  . Obesities, morbid ([place])   . OSA (obstructive sleep apnea)   . Osteoporosis   . PAF (paroxysmal atrial fibrillation) ([place])    Event Monitor (8/15):  Frequent PACs, brief bursts of nonsustained ATach; no AFib  . Psoriasis   . Skin cancer   . Vertigo    Social History   Socioeconomic History  . Marital status: Widowed    Spouse name: Not on file  . Number of children: Not on file  . Years of education: Not on file  . Highest education level: Not on file  Social Needs  . Financial resource strain: Not  on file  . Food insecurity - worry: Not on file  . Food insecurity - inability: Not on file  . Transportation needs - medical: Not on file  . Transportation needs - non-medical: Not on file  Occupational History  . Not on file  Tobacco Use  . Smoking status: Former Smoker    Packs/day: 1.00    Years: 2.00    Pack years: 2.00    Types: Cigarettes    Last attempt to quit: 04/03/1981    Years since quitting: 36.2  . Smokeless tobacco: Never Used  Substance and Sexual Activity  . Alcohol use: No  . Drug use: No  . Sexual activity: Not Currently  Other Topics Concern  . Not on file  Social History Narrative   She lives with her sister in [place] and requires assistance at home. Has an aid who visits and helps at home often. Son works as a Catering manager and also visits and helps when he can. Semi-independent in ADLs, requires walker around home, dependent in IADLs.   Family History  Problem Relation Age of Onset  . Heart attack Father   . Heart attack Brother   . Stroke Neg Hx    ASSESSMENT Recent Results: Lab Results  Component Value Date   INR 1.7 06/11/2017   INR 2.0 06/04/2017   INR 2.1 05/28/2017   Anticoagulation Dosing: 2.5 mg daily   INR today: Subtherapeutic  PLAN Weekly dose was increased by 7%  There are no Patient Instructions on file for this visit. Patient advised to contact clinic or seek medical attention if signs/symptoms of bleeding or thromboembolism occur.  Patient verbalized understanding by repeating back information and was advised to contact me if further medication-related questions arise. Patient was also provided an information handout.  Follow-up 1 week  [name]

## 2017-06-12 NOTE — Patient Instructions (Signed)
Patient educated about medication as defined in this encounter and verbalized understanding by repeating back instructions provided.

## 2017-06-15 ENCOUNTER — Telehealth: Payer: Self-pay | Admitting: *Deleted

## 2017-06-15 NOTE — Telephone Encounter (Signed)
Pt has questions about the bone density scan; thinking about canceling the appt.. Explain Dr Daryll Drown had ordered the test b/c " back pain and recent compression fracture (seen on CAT scan), please get a DEXA scan to see if your bones are thinner (osteoporosis)." Explained she will not be placed in a machine like a MRI; should take about 30 mins. And they should make her as comfortable as possible. Stated ok.

## 2017-06-15 NOTE — Telephone Encounter (Signed)
Thank you Glenda.Marland KitchenMarland Kitchen

## 2017-06-18 ENCOUNTER — Other Ambulatory Visit: Payer: Self-pay | Admitting: Internal Medicine

## 2017-06-19 DIAGNOSIS — J449 Chronic obstructive pulmonary disease, unspecified: Secondary | ICD-10-CM | POA: Diagnosis not present

## 2017-06-21 DIAGNOSIS — J449 Chronic obstructive pulmonary disease, unspecified: Secondary | ICD-10-CM | POA: Diagnosis not present

## 2017-06-21 DIAGNOSIS — G4733 Obstructive sleep apnea (adult) (pediatric): Secondary | ICD-10-CM | POA: Diagnosis not present

## 2017-06-25 ENCOUNTER — Encounter: Payer: Self-pay | Admitting: Pharmacist

## 2017-06-25 DIAGNOSIS — I82402 Acute embolism and thrombosis of unspecified deep veins of left lower extremity: Secondary | ICD-10-CM

## 2017-06-25 DIAGNOSIS — I480 Paroxysmal atrial fibrillation: Secondary | ICD-10-CM

## 2017-06-25 DIAGNOSIS — Z86711 Personal history of pulmonary embolism: Secondary | ICD-10-CM

## 2017-06-25 DIAGNOSIS — Z86718 Personal history of other venous thrombosis and embolism: Secondary | ICD-10-CM

## 2017-06-25 DIAGNOSIS — Z7901 Long term (current) use of anticoagulants: Secondary | ICD-10-CM

## 2017-06-25 LAB — POCT INR: INR: 2.2

## 2017-06-25 MED ORDER — WARFARIN SODIUM 2.5 MG PO TABS: 2.5000 mg | ORAL_TABLET | Freq: Every day | ORAL | 3 refills | Status: DC

## 2017-06-25 NOTE — Patient Instructions (Signed)
Patient educated about medication as defined in this encounter and verbalized understanding by repeating back instructions provided.   

## 2017-06-25 NOTE — Progress Notes (Signed)
Anticoagulation Management Takyah Ciaramitaro a 84 y.o.femalewho is onwarfarintreatment.   Indication: DVT and PEhistory, paroxysmal atrial fibrillation Duration: indefinite Supervising physician: Aldine Contes  Patient reports no signs or symptoms of bleeding or thromboembolism.  Anticoagulation Episode Summary    Current INR goal:   2.0-3.0  TTR:   72.4 % (11 mo)  Next INR check:   07/02/2017  INR from last check:   2.2 (06/25/2017)  Weekly max warfarin dose:     Target end date:     INR check location:     Preferred lab:     Send INR reminders to:      Indications   History of DVT (deep vein thrombosis) [Z86.718] Anticoagulated on Coumadin [Z51.81 Z79.01] History of pulmonary embolism [Z86.711]       Comments:         Anticoagulation Care Providers    Provider Role Specialty Phone number   [name], MD Responsible Internal Medicine [phone]     Allergies  Allergen Reactions  . Ciprofloxacin   . Bactrim Rash  . Codeine Nausea Only   Medication Sig  acetic acid-hydrocortisone (VOSOL-HC) otic solution Place 3 drops into the right ear 3 (three) times daily. For seven days.  Ascorbic Acid (VITAMIN C PO) Take 1 capsule by mouth daily.  atenolol (TENORMIN) 25 MG tablet Take 1 tablet (25 mg total) by mouth 4 (four) times daily as needed. MAY TAKE 1 TABLET 4 TIMES DAILY AS NEEDED.  AZOPT 1 % ophthalmic suspension Place 1 drop into both eyes 2 (two) times daily.  budesonide-formoterol (SYMBICORT) 160-4.5 MCG/ACT inhaler Inhale 2 puffs into the lungs 2 (two) times daily. Rinse mouth after each use  Cholecalciferol (VITAMIN D PO) Take 1 capsule by mouth daily.  clotrimazole (LOTRIMIN) 1 % cream Apply 1 application 2 (two) times daily topically. Approximately 1 gram to the affected area.  COMBIGAN 0.2-0.5 % ophthalmic solution Place 1 drop into both eyes 2 (two) times daily.  cyanocobalamin 1000 MCG tablet Take 1,000 mcg by mouth daily.  diltiazem (CARDIZEM  CD) 120 MG 24 hr capsule TAKE ONE (1) CAPSULE BY MOUTH 2 TIMES DAILY  FLOVENT HFA 44 MCG/ACT inhaler INHALE 1 PUFF TWICE A DAY DAILY  furosemide (LASIX) 20 MG tablet TAKE 1 TABLET (20 MG TOTAL) BY MOUTH DAILY.  HYDROcodone-acetaminophen (NORCO) 10-325 MG tablet Take 1 tablet by mouth every 6 (six) hours as needed for severe pain.  levalbuterol (XOPENEX) 0.63 MG/3ML nebulizer solution Take 3 mLs (0.63 mg total) by nebulization every 8 (eight) hours as needed for wheezing.  meclizine (ANTIVERT) 25 MG tablet Take 1 tablet (25 mg total) by mouth 2 (two) times daily as needed for dizziness. TAKE ONE TABLET 2-3 TIMES A DAY AS NEEDED FOR VERTIGO  Multiple Vitamin (MULTIVITAMIN WITH MINERALS) TABS tablet Take 1 tablet by mouth daily.  Nystatin ([place]) 100000 UNIT/GM POWD Apply topically.  predniSONE (DELTASONE) 10 MG tablet 4 tabs x 2 days, 3 tabs x 2 days, 2 tabs x 2 days, 1 tab x 2 days.  Respiratory Therapy Supplies (FLUTTER) DEVI Blow through 4 times per set, three sets daily  warfarin (COUMADIN) 2.5 MG tablet Take 1 tablet (2.5 mg total) by mouth daily for 1 dose.   Past Medical History:  Diagnosis Date  . Arthritis   . Cataract   . Chronic respiratory failure ([place])   . Endometriosis   . HTN (hypertension)   . Hx of echocardiogram    Echo (8/15):  Mild LVH, EF 65%, no  RWMA, Ao sclerosis, no AS, mod MAC, mild LAE, normal RVSF, PASP 40 mmHg  . Hx pulmonary embolism    on chronic coumadin  . Obesities, morbid ([place])   . OSA (obstructive sleep apnea)   . Osteoporosis   . PAF (paroxysmal atrial fibrillation) ([place])    Event Monitor (8/15):  Frequent PACs, brief bursts of nonsustained ATach; no AFib  . Psoriasis   . Skin cancer   . Vertigo    Social History   Socioeconomic History  . Marital status: Widowed    Spouse name: Not on file  . Number of children: Not on file  . Years of education: Not on file  . Highest education level: Not on file  Occupational History  . Not on file   Social Needs  . Financial resource strain: Not on file  . Food insecurity:    Worry: Not on file    Inability: Not on file  . Transportation needs:    Medical: Not on file    Non-medical: Not on file  Tobacco Use  . Smoking status: Former Smoker    Packs/day: 1.00    Years: 2.00    Pack years: 2.00    Types: Cigarettes    Last attempt to quit: 04/03/1981    Years since quitting: 36.2  . Smokeless tobacco: Never Used  Substance and Sexual Activity  . Alcohol use: No  . Drug use: No  . Sexual activity: Not Currently  Lifestyle  . Physical activity:    Days per week: Not on file    Minutes per session: Not on file  . Stress: Not on file  Relationships  . Social connections:    Talks on phone: Not on file    Gets together: Not on file    Attends religious service: Not on file    Active member of club or organization: Not on file    Attends meetings of clubs or organizations: Not on file    Relationship status: Not on file  Other Topics Concern  . Not on file  Social History Narrative   She lives with her sister in [place] and requires assistance at home. Has an aid who visits and helps at home often. Son works as a Catering manager and also visits and helps when he can. Semi-independent in ADLs, requires walker around home, dependent in IADLs.   Family History  Problem Relation Age of Onset  . Heart attack Father   . Heart attack Brother   . Stroke Neg Hx    ASSESSMENT Recent Results: Lab Results  Component Value Date   INR 2.2 06/25/2017   INR 1.7 06/11/2017   INR 2.0 06/04/2017   Anticoagulation Dosing: 2.5 mg daily, except 3.75 mg on Mondays   INR today: Therapeutic   PLAN Weekly dose was unchanged   Patient Instructions  Patient educated about medication as defined in this encounter and verbalized understanding by repeating back instructions provided.   Patient advised to contact clinic or seek medical attention if signs/symptoms of bleeding or  thromboembolism occur.  Patient verbalized understanding by repeating back information and was advised to contact me if further medication-related questions arise. Patient was also provided an information handout.  Follow-up Return in about 1 week (around 07/02/2017).  [name]

## 2017-06-26 ENCOUNTER — Ambulatory Visit: Payer: Medicare Other | Admitting: Cardiovascular Disease

## 2017-06-26 ENCOUNTER — Encounter: Payer: Self-pay | Admitting: Cardiovascular Disease

## 2017-06-26 ENCOUNTER — Inpatient Hospital Stay: Admission: RE | Admit: 2017-06-26 | Payer: Medicare Other | Source: Ambulatory Visit

## 2017-06-26 VITALS — BP 132/70 | HR 61 | Ht 65.0 in | Wt 240.0 lb

## 2017-06-26 DIAGNOSIS — Z5181 Encounter for therapeutic drug level monitoring: Secondary | ICD-10-CM

## 2017-06-26 DIAGNOSIS — J9611 Chronic respiratory failure with hypoxia: Secondary | ICD-10-CM | POA: Diagnosis not present

## 2017-06-26 DIAGNOSIS — I10 Essential (primary) hypertension: Secondary | ICD-10-CM | POA: Diagnosis not present

## 2017-06-26 DIAGNOSIS — G4733 Obstructive sleep apnea (adult) (pediatric): Secondary | ICD-10-CM

## 2017-06-26 DIAGNOSIS — I491 Atrial premature depolarization: Secondary | ICD-10-CM

## 2017-06-26 DIAGNOSIS — I272 Pulmonary hypertension, unspecified: Secondary | ICD-10-CM

## 2017-06-26 DIAGNOSIS — I50812 Chronic right heart failure: Secondary | ICD-10-CM

## 2017-06-26 DIAGNOSIS — Z7901 Long term (current) use of anticoagulants: Secondary | ICD-10-CM

## 2017-06-26 IMAGING — DX DG CERVICAL SPINE COMPLETE 4+V
5 series · 5 of 5 positions shown · non-contrast
Comparison: None in PACs

CLINICAL DATA: Tingling and numbness in both hands for the past
several months. No known injury. The patient is nonambulatory

EXAM:
CERVICAL SPINE - COMPLETE 4+ VIEW

[c-spine lat]
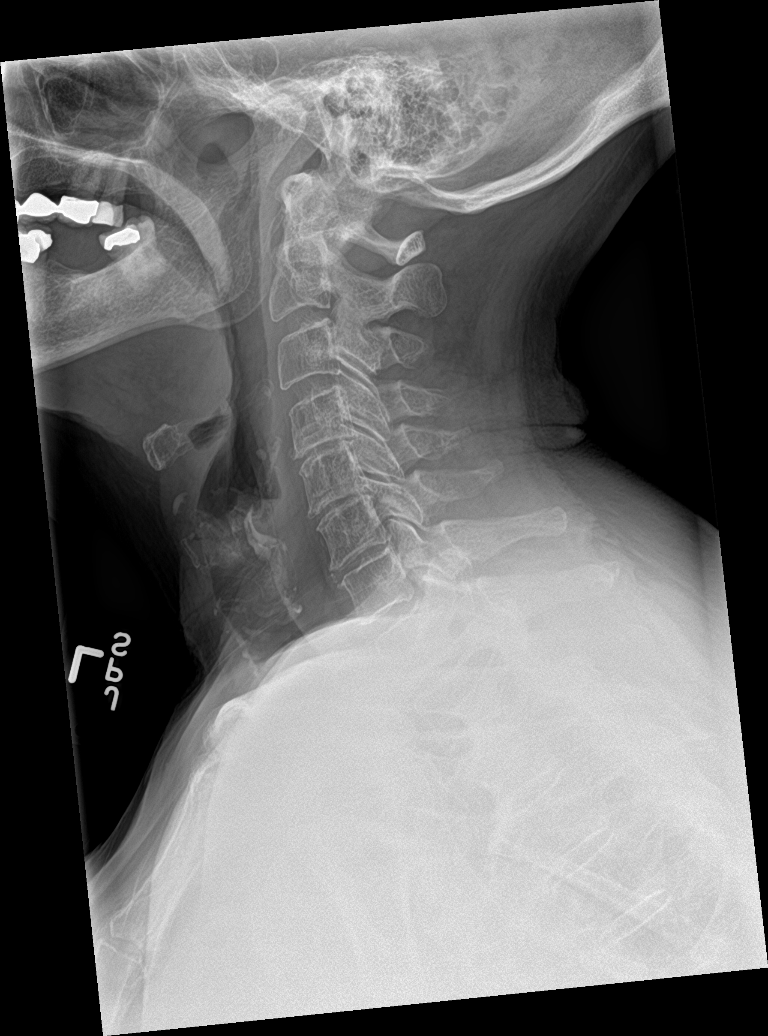

[c-spine obl (1 of 2)]
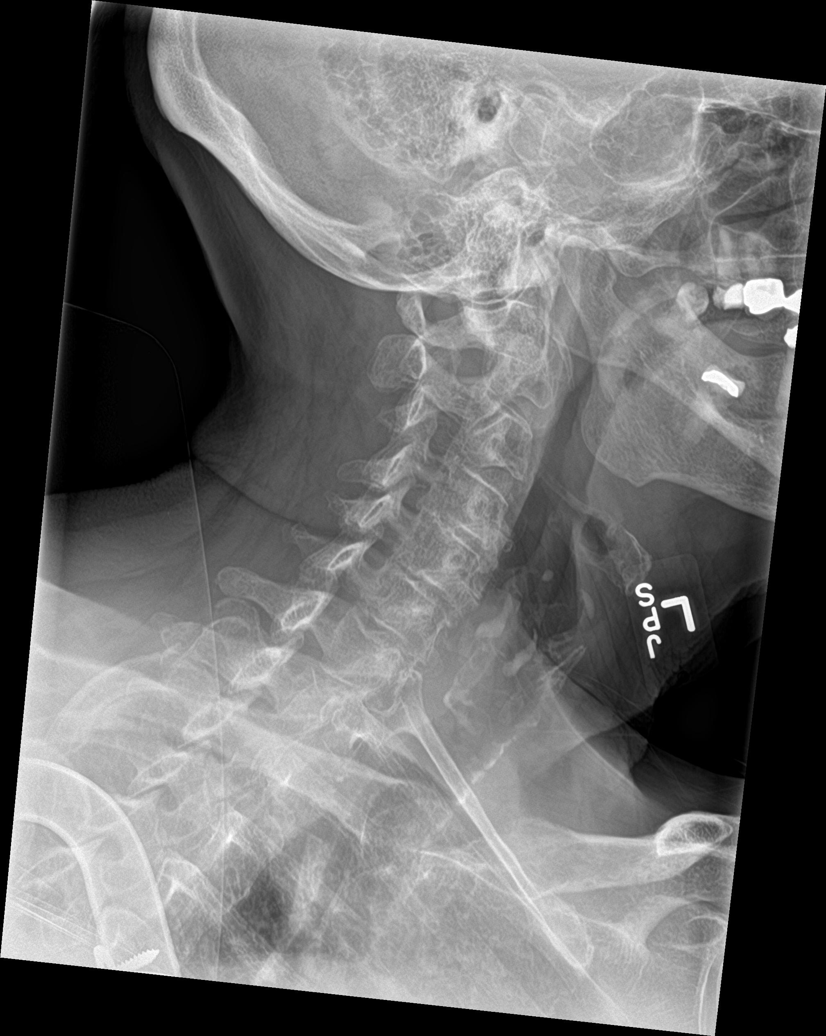

[c-spine obl (2 of 2)]
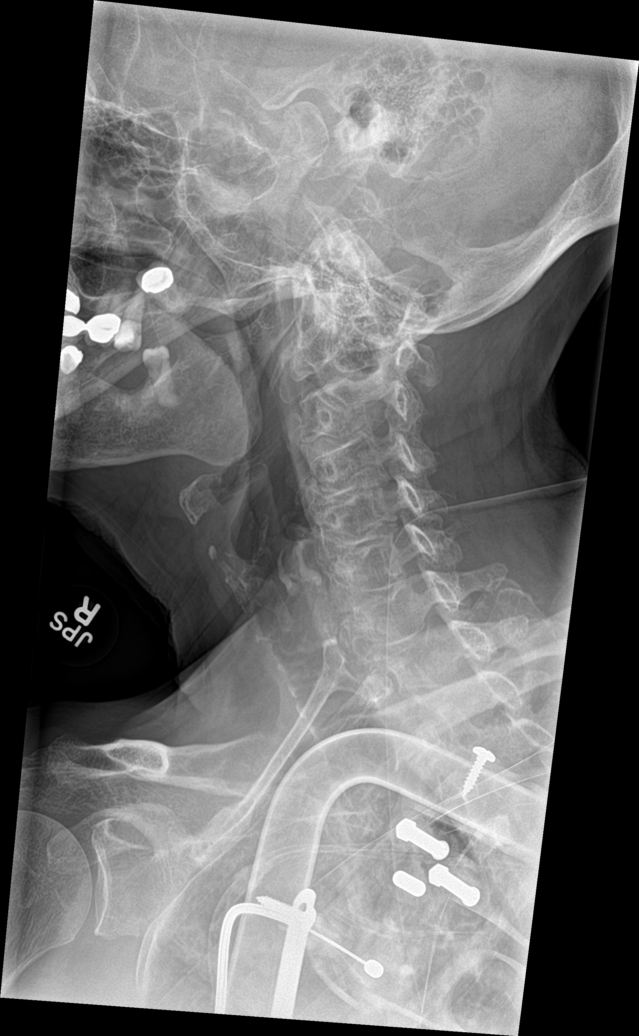

[c-spine ap]
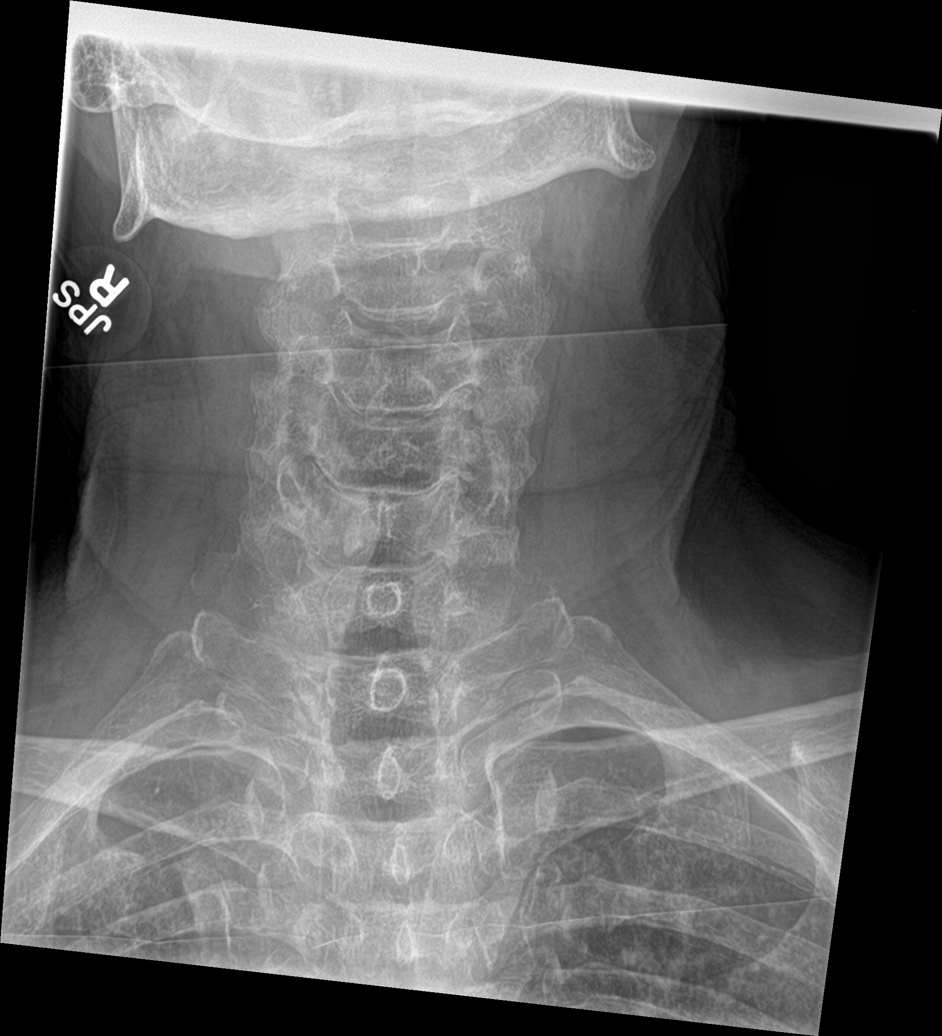

[c-spine open mouth]
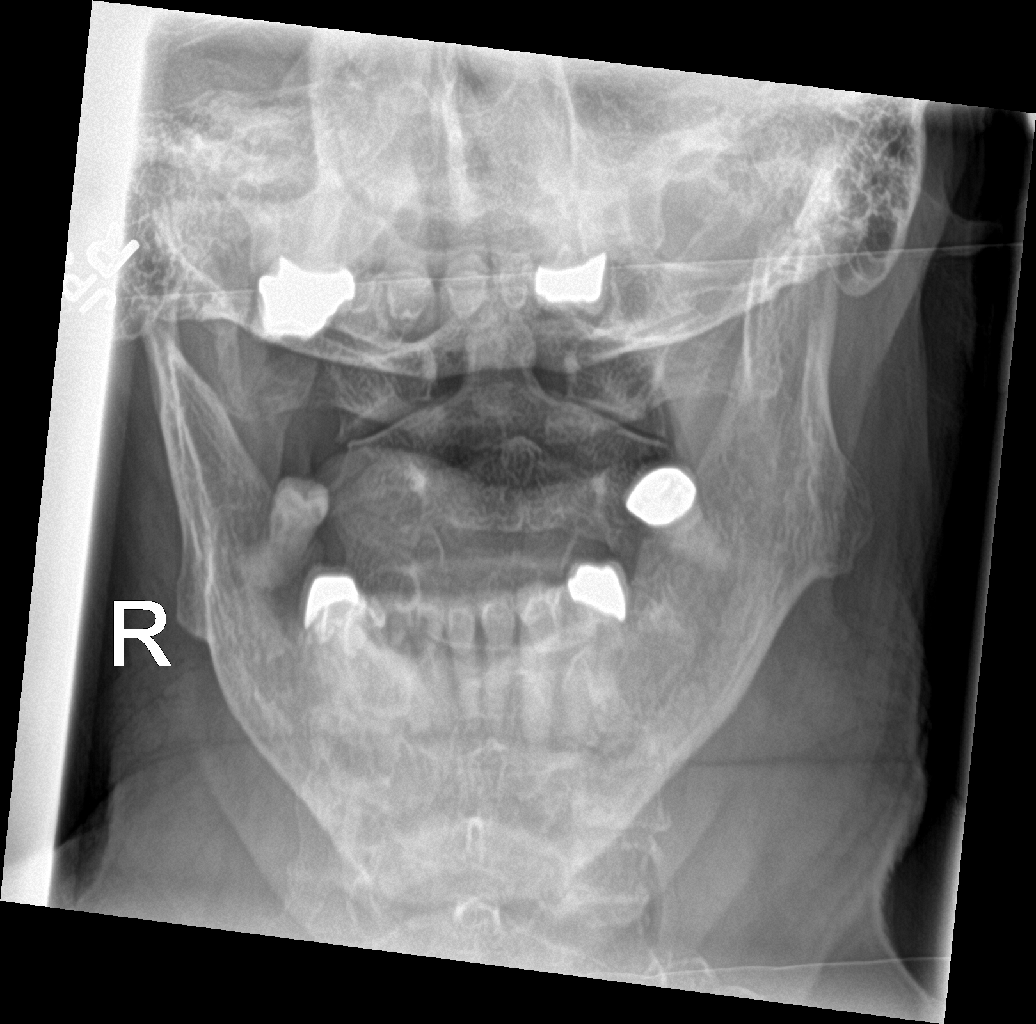

[5 of 5 positions shown; findings below may reference images not displayed]

FINDINGS: The cervical vertebral bodies are preserved in height. There is disc
space narrowing at C3-4 and C4-5 and to a lesser extent C5-6. There
small anterior endplate osteophytes at these levels. There is no
perched facet or spinous process fracture. There is no significant
bony encroachment upon the neural foramina on the right. On the left
there is moderate bony encroachment noted at C5 and C6. The spinous
processes and odontoid are intact. The prevertebral soft tissue
spaces are normal.
IMPRESSION: Multilevel degenerative disc disease of the mid and lower cervical
spine. Moderate multilevel bony encroachment upon the neural
foramina on the left. No compression fracture.

## 2017-06-26 NOTE — Progress Notes (Signed)
INTERNAL MEDICINE TEACHING ATTENDING ADDENDUM - Aldine Contes M.D  Duration- indefinite, Indication- PE, DVT, INR- therapeutic. Agree with pharmacy recommendations as outlined in their note.

## 2017-06-26 NOTE — Patient Instructions (Signed)
Dr Sallyanne Kuster recommends that you schedule a follow-up appointment in 12 months. You will receive a reminder letter in the mail two months in advance. If you don't receive a letter, please call our office to schedule the follow-up appointment.  If you need a refill on your cardiac medications before your next appointment, please call your pharmacy.

## 2017-06-26 NOTE — Progress Notes (Signed)
Cardiology Office Note    Date:  06/26/2017   ID:  [name], DOB 1932-03-12, MRN [id number]  PCP:  Jacqueline Falcon, MD  Cardiologist:   Jacqueline Klein, MD   chief complaint: Right heart failure follow-up   History of Present Illness:  Jacqueline Nguyen is a 84 y.o. female who presents for right heart failure (secondary to obstructive sleep apnea, previous pulmonary embolism, obesity) and reported paroxysmal atrial fibrillation, Not documented in a long time.. She is accompanied today by her son, as before.She now lives with him in their house on [facility] [address].  Moving to the [facility] has had a positive impact on her symptoms.  She is much less anxious.  She is less preoccupied by her blood pressure.  She is troubled by palpitations occur usually early in the morning, but throughout the day she does not notice them.  She stays busier.  She is still mostly in a scooter, but enjoys watching the [place] from the porch.  He has NYHA functional class III exertional dyspnea but only uses the oxygen when active, rarely needs it at rest.  She denies chest pain, syncope or falls.  She usually has no more than 1+ pedal edema.    She underwent Mohs surgery for skin cancer on the left side of her nose.  She is scheduled for follow-up CT of the chest without contrast and an office visit with Dr. Annamaria Boots tomorrow for some "spots on her lungs".  Previous monitoring has shown that she has frequent PACs and sometimes PVCs, with pauses sometimes up to 2.5 seconds in duration.  In 2009 she had a "massive" bilateral pulmonary embolism and she has long-standing systemic hypertension, morbid obesity, reactive airway disease, obstructive sleep apnea (on CPAP) and osteoporosis and degenerative arthritis. She wears oxygen at night and with activity and often uses a scooter. In 2014 she had transient right eye blindness, not long after cataract surgery. Has glaucoma. Echo in August 2015 showed normal left ventricular regional wall  motion and EF 65%, no significant valvular abnormalities, estimated systolic PA pressure 40 mm Hg. Her event monitor showed frequent PACs and brief bursts of nonsustained atrial tachycardia, but there was no confirmed atrial fibrillation.    Past Medical History:  Diagnosis Date  . Arthritis   . Cataract   . Chronic respiratory failure ([place])   . Endometriosis   . HTN (hypertension)   . Hx of echocardiogram    Echo (8/15):  Mild LVH, EF 65%, no RWMA, Ao sclerosis, no AS, mod MAC, mild LAE, normal RVSF, PASP 40 mmHg  . Hx pulmonary embolism    on chronic coumadin  . Obesities, morbid ([place])   . OSA (obstructive sleep apnea)   . Osteoporosis   . PAF (paroxysmal atrial fibrillation) ([place])    Event Monitor (8/15):  Frequent PACs, brief bursts of nonsustained ATach; no AFib  . Psoriasis   . Skin cancer   . Vertigo     Past Surgical History:  Procedure Laterality Date  . cataract surgery    . GALLBLADDER SURGERY    . NOSE SURGERY    . OOPHORECTOMY    . SKIN SURGERY    . UVULOPALATOPHARYNGOPLASTY      Current Medications: Outpatient Medications Prior to Visit  Medication Sig Dispense Refill  . acetic acid-hydrocortisone (VOSOL-HC) otic solution Place 3 drops into the right ear 3 (three) times daily. For seven days. 10 mL 0  . Ascorbic Acid (VITAMIN C PO) Take 1 capsule by  mouth daily.    Marland Kitchen atenolol (TENORMIN) 25 MG tablet Take 1 tablet (25 mg total) by mouth 4 (four) times daily as needed. MAY TAKE 1 TABLET 4 TIMES DAILY AS NEEDED. 120 tablet 3  . AZOPT 1 % ophthalmic suspension Place 1 drop into both eyes 2 (two) times daily.    . budesonide-formoterol (SYMBICORT) 160-4.5 MCG/ACT inhaler Inhale 2 puffs into the lungs 2 (two) times daily. Rinse mouth after each use 3 Inhaler 3  . Cholecalciferol (VITAMIN D PO) Take 1 capsule by mouth daily.    . clotrimazole (LOTRIMIN) 1 % cream Apply 1 application 2 (two) times daily topically. Approximately 1 gram to the affected area. 28 g 1  .  COMBIGAN 0.2-0.5 % ophthalmic solution Place 1 drop into both eyes 2 (two) times daily.    . cyanocobalamin 1000 MCG tablet Take 1,000 mcg by mouth daily.    Marland Kitchen diltiazem (CARDIZEM CD) 120 MG 24 hr capsule TAKE ONE (1) CAPSULE BY MOUTH 2 TIMES DAILY 180 capsule 3  . FLOVENT HFA 44 MCG/ACT inhaler INHALE 1 PUFF TWICE A DAY DAILY 10.6 Inhaler 6  . furosemide (LASIX) 20 MG tablet TAKE 1 TABLET (20 MG TOTAL) BY MOUTH DAILY. 90 tablet 1  . HYDROcodone-acetaminophen (NORCO) 10-325 MG tablet Take 1 tablet by mouth every 6 (six) hours as needed for severe pain. 120 tablet 0  . levalbuterol (XOPENEX) 0.63 MG/3ML nebulizer solution Take 3 mLs (0.63 mg total) by nebulization every 8 (eight) hours as needed for wheezing. 3 mL 6  . meclizine (ANTIVERT) 25 MG tablet Take 1 tablet (25 mg total) by mouth 2 (two) times daily as needed for dizziness. TAKE ONE TABLET 2-3 TIMES A DAY AS NEEDED FOR VERTIGO 30 tablet 3  . Multiple Vitamin (MULTIVITAMIN WITH MINERALS) TABS tablet Take 1 tablet by mouth daily.    Marland Kitchen Nystatin ([place]) 100000 UNIT/GM POWD Apply topically.    . predniSONE (DELTASONE) 10 MG tablet 4 tabs x 2 days, 3 tabs x 2 days, 2 tabs x 2 days, 1 tab x 2 days. 20 tablet 0  . Respiratory Therapy Supplies (FLUTTER) DEVI Blow through 4 times per set, three sets daily 1 each 0  . warfarin (COUMADIN) 2.5 MG tablet Take 1 tablet (2.5 mg total) by mouth daily. exept take 1.5 tablet (3.75 mg) on Mondays 33 tablet 3   No facility-administered medications prior to visit.      Allergies:   Ciprofloxacin; Bactrim; and Codeine   Social History   Socioeconomic History  . Marital status: Widowed    Spouse name: Not on file  . Number of children: Not on file  . Years of education: Not on file  . Highest education level: Not on file  Occupational History  . Not on file  Social Needs  . Financial resource strain: Not on file  . Food insecurity:    Worry: Not on file    Inability: Not on file  . Transportation  needs:    Medical: Not on file    Non-medical: Not on file  Tobacco Use  . Smoking status: Former Smoker    Packs/day: 1.00    Years: 2.00    Pack years: 2.00    Types: Cigarettes    Last attempt to quit: 04/03/1981    Years since quitting: 36.2  . Smokeless tobacco: Never Used  Substance and Sexual Activity  . Alcohol use: No  . Drug use: No  . Sexual activity: Not Currently  Lifestyle  .  Physical activity:    Days per week: Not on file    Minutes per session: Not on file  . Stress: Not on file  Relationships  . Social connections:    Talks on phone: Not on file    Gets together: Not on file    Attends religious service: Not on file    Active member of club or organization: Not on file    Attends meetings of clubs or organizations: Not on file    Relationship status: Not on file  Other Topics Concern  . Not on file  Social History Narrative   She lives with her sister in [place] and requires assistance at home. Has an aid who visits and helps at home often. Son works as a Catering manager and also visits and helps when he can. Semi-independent in ADLs, requires walker around home, dependent in IADLs.     Family History:  The patient's family history includes Heart attack in her brother and father.   ROS:   Please see the history of present illness.    ROS All other systems reviewed and are negative.   PHYSICAL EXAM:   VS:  BP 132/70   Pulse 61   Ht _0  (1.651 m)   Wt 240 lb (108.9 kg) Comment: per son's report; unable to weigh this morning  BMI 39.94 kg/m     General: Alert, oriented x3, no distress, morbidly obese Head: no evidence of trauma, PERRL, EOMI, no exophtalmos or lid lag, no myxedema, no xanthelasma; normal ears, nose and oropharynx Neck: normal jugular venous pulsations and no hepatojugular reflux; brisk carotid pulses without delay and no carotid bruits Chest: clear to auscultation, no signs of consolidation by percussion or palpation, normal  fremitus, symmetrical and full respiratory excursions Cardiovascular: normal position and quality of the apical impulse, regular rhythm, normal first and second heart sounds, 2/6 holosystolic murmur heard on both sides of the lower sternal border, no diastolic murmurs, rubs or gallops Abdomen: no tenderness or distention, no masses by palpation, no abnormal pulsatility or arterial bruits, normal bowel sounds, no hepatosplenomegaly Extremities: no clubbing, cyanosis; symmetrical 1+ edema of the feet and ankles; 2+ radial, ulnar and brachial pulses bilaterally; 2+ right femoral, posterior tibial and dorsalis pedis pulses; 2+ left femoral, posterior tibial and dorsalis pedis pulses; no subclavian or femoral bruits Neurological: grossly nonfocal Psych: Normal mood and affect   Wt Readings from Last 3 Encounters:  06/26/17 240 lb (108.9 kg)  04/18/17 256 lb 1.6 oz (116.2 kg)  02/14/17 258 lb 8 oz (117.3 kg)      Studies/Labs Reviewed:   EKG:  EKG is ordered today.  The ekg ordered today demonstrates normal sinus rhythm with poor R wave progression across the anterior precordium but otherwise normal, without repolarization abnormalities, QTC 410 ms Recent Labs: 06/28/2016: ALT 10; B Natriuretic Peptide 246.5; Magnesium 2.0 12/06/2016: BUN 13; Creatinine, Ser 0.64; Hemoglobin 12.8; Platelets 365; Potassium 4.2; Sodium 146; TSH 1.300   Lipid Panel    Component Value Date/Time   CHOL 205 (H) 08/30/2012 0550   TRIG 102 08/30/2012 0550   HDL 55 08/30/2012 0550   CHOLHDL 3.7 08/30/2012 0550   VLDL 20 08/30/2012 0550   LDLCALC 130 (H) 08/30/2012 0550     ASSESSMENT:    No diagnosis found.   PLAN:  In order of problems listed above:  1. Right HF: While limited by obesity, her exam suggests at most mild volume overload.  Right heart dysfunction is  multifactorial: related to previous pulmonary embolism, severe obesity, sleep apnea, chronic hypoxia. 2. OSA: Monitored by Dr. Annamaria Boots. The patient  reports compliance with CPAP.  Has an appointment with Dr. Annamaria Boots tomorrow 3. PAH: Echo estimation shows this to be relatively mild, around 40 mmHg 4. Chronic hypoxemia : On home oxygen 3 L by nasal cannula. Has a history of smoking and probably has COPD although her PFTs showed matched reduction in FVC and FEV1 under 50% of predicted. Clearly there is a component of obesity hypoventilation syndrome.  5. Palpitations:  while atrial fibrillation has been reported in the past, this has not been documented in years.  She is on anticoagulation for pulmonary embolism anyway. 6. Warfarin: No bleeding complications.  INR followed locally. 7. HTN: Controlled today, no history of symptomatic orthostatic hypotension 8. Morbid obesity: Unable to weigh today.  Remains morbidly obese.   Medication Adjustments/Labs and Tests Ordered: Current medicines are reviewed at length with the patient today.  Concerns regarding medicines are outlined above.  Medication changes, Labs and Tests ordered today are listed in the Patient Instructions below. There are no Patient Instructions on file for this visit.   Signed, Jacqueline Klein, MD  06/26/2017 10:05 AM    [address], [place], [place]  [address] Phone: [phone]; Fax: [phone]

## 2017-06-27 ENCOUNTER — Ambulatory Visit: Payer: Medicare Other | Admitting: Internal Medicine

## 2017-06-27 ENCOUNTER — Inpatient Hospital Stay: Admission: RE | Admit: 2017-06-27 | Payer: Medicare Other | Source: Ambulatory Visit

## 2017-06-27 ENCOUNTER — Encounter: Payer: Self-pay | Admitting: Cardiovascular Disease

## 2017-06-27 DIAGNOSIS — G4733 Obstructive sleep apnea (adult) (pediatric): Secondary | ICD-10-CM | POA: Diagnosis not present

## 2017-06-27 DIAGNOSIS — I491 Atrial premature depolarization: Secondary | ICD-10-CM | POA: Insufficient documentation

## 2017-06-27 NOTE — Progress Notes (Signed)
INTERNAL MEDICINE TEACHING ATTENDING ADDENDUM - Shamond Skelton M.D  Duration- indefinite, Indication- PE, DVT, INR- therapeutic. Agree with pharmacy recommendations as outlined in their note.      

## 2017-07-02 ENCOUNTER — Encounter: Payer: Self-pay | Admitting: Pharmacist

## 2017-07-02 DIAGNOSIS — Z86711 Personal history of pulmonary embolism: Secondary | ICD-10-CM

## 2017-07-02 DIAGNOSIS — Z7901 Long term (current) use of anticoagulants: Secondary | ICD-10-CM

## 2017-07-02 DIAGNOSIS — I480 Paroxysmal atrial fibrillation: Secondary | ICD-10-CM | POA: Diagnosis not present

## 2017-07-02 DIAGNOSIS — Z86718 Personal history of other venous thrombosis and embolism: Secondary | ICD-10-CM

## 2017-07-02 LAB — POCT INR: INR: 1.8

## 2017-07-05 ENCOUNTER — Telehealth: Payer: Self-pay | Admitting: Internal Medicine

## 2017-07-05 DIAGNOSIS — G4733 Obstructive sleep apnea (adult) (pediatric): Secondary | ICD-10-CM | POA: Diagnosis not present

## 2017-07-05 NOTE — Telephone Encounter (Addendum)
Rec'd phone call from [facility] .  Patient is unable to to participate in the Heart Failure Program but the patient would like a [facility] nurse to come out to Assess her Wound on her Buttocks.  Patient states she has used [facility] in the past and would like to be seen as soon as possible by [facility]. Per H Nurse patient is Homebound and unable to leave her home.  The patient has used [facility] in the past and would like to use them again.

## 2017-07-05 NOTE — Telephone Encounter (Signed)
OK to make referral to [facility] home nursing service. Not sure how we do this. DrG

## 2017-07-05 NOTE — Progress Notes (Signed)
Anticoagulation Management Jacqueline Nguyen a 84 y.o.femalewho is onwarfarintreatment. She monitors INR at home through [facility], results were faxed in 07/02/17. I have called patient 3 times, but unable to reach. Left messages to call back.  Indication: DVT and PEhistory, paroxysmal atrial fibrillation Duration: indefinite Supervising physician: Murriel Hopper  Patient reports no signs or symptoms of bleeding or thromboembolism.  Anticoagulation Episode Summary    Current INR goal:   2.0-3.0  TTR:   71.9 % (11.3 mo)  Next INR check:   07/02/2017  INR from last check:   1.8! (07/02/2017)  Weekly max warfarin dose:     Target end date:     INR check location:     Preferred lab:     Send INR reminders to:      Indications   History of DVT (deep vein thrombosis) [Z86.718] Anticoagulated on Coumadin [Z79.01] History of pulmonary embolism [Z86.711]       Comments:         Anticoagulation Care Providers    Provider Role Specialty Phone number   [name], MD Responsible Internal Medicine [phone]     Allergies  Allergen Reactions  . Ciprofloxacin   . Bactrim Rash  . Codeine Nausea Only   Medication Sig  acetic acid-hydrocortisone (VOSOL-HC) otic solution Place 3 drops into the right ear 3 (three) times daily. For seven days.  albuterol (PROVENTIL HFA;VENTOLIN HFA) 108 (90 Base) MCG/ACT inhaler Inhale 1-2 puffs into the lungs daily as needed for shortness of breath or wheezing.  Ascorbic Acid (VITAMIN C PO) Take 1 capsule by mouth daily.  atenolol (TENORMIN) 25 MG tablet Take 1 tablet (25 mg total) by mouth 4 (four) times daily as needed. MAY TAKE 1 TABLET 4 TIMES DAILY AS NEEDED.  AZOPT 1 % ophthalmic suspension Place 1 drop into both eyes 2 (two) times daily.  budesonide-formoterol (SYMBICORT) 160-4.5 MCG/ACT inhaler Inhale 2 puffs into the lungs 2 (two) times daily. Rinse mouth after each use  Cholecalciferol (VITAMIN D PO) Take 1 capsule by mouth daily.   clotrimazole (LOTRIMIN) 1 % cream Apply 1 application 2 (two) times daily topically. Approximately 1 gram to the affected area.  COMBIGAN 0.2-0.5 % ophthalmic solution Place 1 drop into both eyes 2 (two) times daily.  cyanocobalamin 1000 MCG tablet Take 1,000 mcg by mouth daily.  diltiazem (CARDIZEM CD) 120 MG 24 hr capsule TAKE ONE (1) CAPSULE BY MOUTH 2 TIMES DAILY  FLOVENT HFA 44 MCG/ACT inhaler INHALE 1 PUFF TWICE A DAY DAILY  furosemide (LASIX) 20 MG tablet TAKE 1 TABLET (20 MG TOTAL) BY MOUTH DAILY.  HYDROcodone-acetaminophen (NORCO) 10-325 MG tablet Take 1 tablet by mouth every 6 (six) hours as needed for severe pain.  levalbuterol (XOPENEX) 0.63 MG/3ML nebulizer solution Take 3 mLs (0.63 mg total) by nebulization every 8 (eight) hours as needed for wheezing.  meclizine (ANTIVERT) 25 MG tablet Take 1 tablet (25 mg total) by mouth 2 (two) times daily as needed for dizziness. TAKE ONE TABLET 2-3 TIMES A DAY AS NEEDED FOR VERTIGO  Multiple Vitamin (MULTIVITAMIN WITH MINERALS) TABS tablet Take 1 tablet by mouth daily.  Nystatin ([place]) 100000 UNIT/GM POWD Apply topically.  Respiratory Therapy Supplies (FLUTTER) DEVI Blow through 4 times per set, three sets daily  Turmeric (RA TURMERIC) 500 MG CAPS Take 500 mg by mouth at bedtime.  warfarin (COUMADIN) 2.5 MG tablet Take 1 tablet (2.5 mg total) by mouth daily. exept take 1.5 tablet (3.75 mg) on Mondays   Past Medical History:  Diagnosis Date  . Arthritis   . Cataract   . Chronic respiratory failure ([place])   . Endometriosis   . HTN (hypertension)   . Hx of echocardiogram    Echo (8/15):  Mild LVH, EF 65%, no RWMA, Ao sclerosis, no AS, mod MAC, mild LAE, normal RVSF, PASP 40 mmHg  . Hx pulmonary embolism    on chronic coumadin  . Obesities, morbid ([place])   . OSA (obstructive sleep apnea)   . Osteoporosis   . PAF (paroxysmal atrial fibrillation) ([place])    Event Monitor (8/15):  Frequent PACs, brief bursts of nonsustained ATach; no AFib   . Psoriasis   . Skin cancer   . Vertigo    Social History   Socioeconomic History  . Marital status: Widowed    Spouse name: Not on file  . Number of children: Not on file  . Years of education: Not on file  . Highest education level: Not on file  Occupational History  . Not on file  Social Needs  . Financial resource strain: Not on file  . Food insecurity:    Worry: Not on file    Inability: Not on file  . Transportation needs:    Medical: Not on file    Non-medical: Not on file  Tobacco Use  . Smoking status: Former Smoker    Packs/day: 1.00    Years: 2.00    Pack years: 2.00    Types: Cigarettes    Last attempt to quit: 04/03/1981    Years since quitting: 36.2  . Smokeless tobacco: Never Used  Substance and Sexual Activity  . Alcohol use: No  . Drug use: No  . Sexual activity: Not Currently  Lifestyle  . Physical activity:    Days per week: Not on file    Minutes per session: Not on file  . Stress: Not on file  Relationships  . Social connections:    Talks on phone: Not on file    Gets together: Not on file    Attends religious service: Not on file    Active member of club or organization: Not on file    Attends meetings of clubs or organizations: Not on file    Relationship status: Not on file  Other Topics Concern  . Not on file  Social History Narrative   She lives with her sister in [place] and requires assistance at home. Has an aid who visits and helps at home often. Son works as a Catering manager and also visits and helps when he can. Semi-independent in ADLs, requires walker around home, dependent in IADLs.   Family History  Problem Relation Age of Onset  . Heart attack Father   . Heart attack Brother   . Stroke Neg Hx    ASSESSMENT Recent Results: Lab Results  Component Value Date   INR 1.8 07/02/2017   INR 2.2 06/25/2017   INR 1.7 06/11/2017   Anticoagulation Dosing: 3.75 mg Mondays, 2.5 mg all other days   INR today:  Subtherapeutic  PLAN  Unable to reach patient, INR re-check in 4 days.  There are no Patient Instructions on file for this visit. Patient advised to contact clinic or seek medical attention if signs/symptoms of bleeding or thromboembolism occur.  Patient verbalized understanding by repeating back information and was advised to contact me if further medication-related questions arise. Patient was also provided an information handout.  Follow-up Return in about 1 week (around 07/09/2017).  [name]

## 2017-07-05 NOTE — Patient Instructions (Signed)
Patient educated about medication as defined in this encounter and verbalized understanding by repeating back instructions provided.   

## 2017-07-06 LAB — PROTIME-INR

## 2017-07-06 NOTE — Progress Notes (Signed)
Reviewed Thanks DrG

## 2017-07-09 ENCOUNTER — Encounter: Payer: Self-pay | Admitting: Pharmacist

## 2017-07-09 DIAGNOSIS — Z86711 Personal history of pulmonary embolism: Secondary | ICD-10-CM

## 2017-07-09 DIAGNOSIS — Z86718 Personal history of other venous thrombosis and embolism: Secondary | ICD-10-CM

## 2017-07-09 DIAGNOSIS — Z7901 Long term (current) use of anticoagulants: Secondary | ICD-10-CM

## 2017-07-09 LAB — POCT INR: INR: 2

## 2017-07-09 NOTE — Progress Notes (Signed)
Anticoagulation Management Jacqueline Nguyen a 84 y.o.femalewho is onwarfarintreatment. She monitors INR at home through [facility], results were faxed in 07/09/17  Indication: DVT and PEhistory, paroxysmal atrial fibrillation Duration: indefinite Supervising physician:Lawrence Klima  Patient reports no signs or symptoms of bleeding or thromboembolism.  Anticoagulation Episode Summary    Current INR goal:   2.0-3.0  TTR:   70.5 % (11.5 mo)  Next INR check:   07/16/2017  INR from last check:   2.0 (07/09/2017)  Weekly max warfarin dose:     Target end date:     INR check location:     Preferred lab:     Send INR reminders to:      Indications   History of DVT (deep vein thrombosis) [Z86.718] Anticoagulated on Coumadin [Z79.01] History of pulmonary embolism [Z86.711]       Comments:         Anticoagulation Care Providers    Provider Role Specialty Phone number   [name], MD Responsible Internal Medicine [phone]     Allergies  Allergen Reactions  . Ciprofloxacin   . Bactrim Rash  . Codeine Nausea Only   Medication Sig  acetic acid-hydrocortisone (VOSOL-HC) otic solution Place 3 drops into the right ear 3 (three) times daily. For seven days.  albuterol (PROVENTIL HFA;VENTOLIN HFA) 108 (90 Base) MCG/ACT inhaler Inhale 1-2 puffs into the lungs daily as needed for shortness of breath or wheezing.  Ascorbic Acid (VITAMIN C PO) Take 1 capsule by mouth daily.  atenolol (TENORMIN) 25 MG tablet Take 1 tablet (25 mg total) by mouth 4 (four) times daily as needed. MAY TAKE 1 TABLET 4 TIMES DAILY AS NEEDED.  AZOPT 1 % ophthalmic suspension Place 1 drop into both eyes 2 (two) times daily.  budesonide-formoterol (SYMBICORT) 160-4.5 MCG/ACT inhaler Inhale 2 puffs into the lungs 2 (two) times daily. Rinse mouth after each use  Cholecalciferol (VITAMIN D PO) Take 1 capsule by mouth daily.  clotrimazole (LOTRIMIN) 1 % cream Apply 1 application 2 (two) times daily topically.  Approximately 1 gram to the affected area.  COMBIGAN 0.2-0.5 % ophthalmic solution Place 1 drop into both eyes 2 (two) times daily.  cyanocobalamin 1000 MCG tablet Take 1,000 mcg by mouth daily.  diltiazem (CARDIZEM CD) 120 MG 24 hr capsule TAKE ONE (1) CAPSULE BY MOUTH 2 TIMES DAILY  FLOVENT HFA 44 MCG/ACT inhaler INHALE 1 PUFF TWICE A DAY DAILY  furosemide (LASIX) 20 MG tablet TAKE 1 TABLET (20 MG TOTAL) BY MOUTH DAILY.  HYDROcodone-acetaminophen (NORCO) 10-325 MG tablet Take 1 tablet by mouth every 6 (six) hours as needed for severe pain.  levalbuterol (XOPENEX) 0.63 MG/3ML nebulizer solution Take 3 mLs (0.63 mg total) by nebulization every 8 (eight) hours as needed for wheezing.  meclizine (ANTIVERT) 25 MG tablet Take 1 tablet (25 mg total) by mouth 2 (two) times daily as needed for dizziness. TAKE ONE TABLET 2-3 TIMES A DAY AS NEEDED FOR VERTIGO  Multiple Vitamin (MULTIVITAMIN WITH MINERALS) TABS tablet Take 1 tablet by mouth daily.  Nystatin ([place]) 100000 UNIT/GM POWD Apply topically.  Respiratory Therapy Supplies (FLUTTER) DEVI Blow through 4 times per set, three sets daily  Turmeric (RA TURMERIC) 500 MG CAPS Take 500 mg by mouth at bedtime.  warfarin (COUMADIN) 2.5 MG tablet Take 1 tablet (2.5 mg total) by mouth daily. exept take 1.5 tablet (3.75 mg) on Mondays   Past Medical History:  Diagnosis Date  . Arthritis   . Cataract   . Chronic respiratory failure (  [place])   . Endometriosis   . HTN (hypertension)   . Hx of echocardiogram    Echo (8/15):  Mild LVH, EF 65%, no RWMA, Ao sclerosis, no AS, mod MAC, mild LAE, normal RVSF, PASP 40 mmHg  . Hx pulmonary embolism    on chronic coumadin  . Obesities, morbid ([place])   . OSA (obstructive sleep apnea)   . Osteoporosis   . PAF (paroxysmal atrial fibrillation) ([place])    Event Monitor (8/15):  Frequent PACs, brief bursts of nonsustained ATach; no AFib  . Psoriasis   . Skin cancer   . Vertigo    Social History   Socioeconomic History   . Marital status: Widowed    Spouse name: Not on file  . Number of children: Not on file  . Years of education: Not on file  . Highest education level: Not on file  Occupational History  . Not on file  Social Needs  . Financial resource strain: Not on file  . Food insecurity:    Worry: Not on file    Inability: Not on file  . Transportation needs:    Medical: Not on file    Non-medical: Not on file  Tobacco Use  . Smoking status: Former Smoker    Packs/day: 1.00    Years: 2.00    Pack years: 2.00    Types: Cigarettes    Last attempt to quit: 04/03/1981    Years since quitting: 36.2  . Smokeless tobacco: Never Used  Substance and Sexual Activity  . Alcohol use: No  . Drug use: No  . Sexual activity: Not Currently  Lifestyle  . Physical activity:    Days per week: Not on file    Minutes per session: Not on file  . Stress: Not on file  Relationships  . Social connections:    Talks on phone: Not on file    Gets together: Not on file    Attends religious service: Not on file    Active member of club or organization: Not on file    Attends meetings of clubs or organizations: Not on file    Relationship status: Not on file  Other Topics Concern  . Not on file  Social History Narrative   She lives with her sister in [place] and requires assistance at home. Has an aid who visits and helps at home often. Son works as a Catering manager and also visits and helps when he can. Semi-independent in ADLs, requires walker around home, dependent in IADLs.   Family History  Problem Relation Age of Onset  . Heart attack Father   . Heart attack Brother   . Stroke Neg Hx    ASSESSMENT Recent Results: Lab Results  Component Value Date   INR 2.0 07/09/2017   INR 1.8 07/02/2017   INR 2.2 06/25/2017   Anticoagulation Dosing: 3.75 mg Mondays, 2.5 mg all other days   INR today: Therapeutic  PLAN Weekly dose was unchanged  There are no Patient Instructions on file for this  visit. Patient advised to contact clinic or seek medical attention if signs/symptoms of bleeding or thromboembolism occur.  Patient verbalized understanding by repeating back information and was advised to contact me if further medication-related questions arise. Patient was also provided an information handout.  Follow-up 1 week  [name]

## 2017-07-10 LAB — PROTIME-INR

## 2017-07-10 NOTE — Progress Notes (Signed)
Indication: Venous thromboembolism and paroxysmal atrial fibrillation. Duration: Indefinite. INR: At target. Agree with Dr. Julianne Rice assessment and plan.

## 2017-07-11 ENCOUNTER — Other Ambulatory Visit: Payer: Self-pay | Admitting: Oncology

## 2017-07-11 DIAGNOSIS — G4733 Obstructive sleep apnea (adult) (pediatric): Secondary | ICD-10-CM | POA: Diagnosis not present

## 2017-07-11 DIAGNOSIS — S31809S Unspecified open wound of unspecified buttock, sequela: Secondary | ICD-10-CM

## 2017-07-12 ENCOUNTER — Telehealth: Payer: Self-pay | Admitting: Internal Medicine

## 2017-07-12 NOTE — Telephone Encounter (Signed)
Talked to [facility] yesterday - stated need to order [facility] referral. She talked to Dr Beryle Beams - who put in the referral.

## 2017-07-13 DIAGNOSIS — M1991 Primary osteoarthritis, unspecified site: Secondary | ICD-10-CM | POA: Diagnosis not present

## 2017-07-13 DIAGNOSIS — J9610 Chronic respiratory failure, unspecified whether with hypoxia or hypercapnia: Secondary | ICD-10-CM | POA: Diagnosis not present

## 2017-07-13 DIAGNOSIS — I10 Essential (primary) hypertension: Secondary | ICD-10-CM | POA: Diagnosis not present

## 2017-07-13 DIAGNOSIS — I480 Paroxysmal atrial fibrillation: Secondary | ICD-10-CM | POA: Diagnosis not present

## 2017-07-13 DIAGNOSIS — S31811D Laceration without foreign body of right buttock, subsequent encounter: Secondary | ICD-10-CM | POA: Diagnosis not present

## 2017-07-16 LAB — POCT INR: INR: 2

## 2017-07-17 ENCOUNTER — Telehealth (INDEPENDENT_AMBULATORY_CARE_PROVIDER_SITE_OTHER): Payer: Medicare Other

## 2017-07-17 ENCOUNTER — Other Ambulatory Visit: Payer: Self-pay | Admitting: *Deleted

## 2017-07-17 DIAGNOSIS — Z86718 Personal history of other venous thrombosis and embolism: Secondary | ICD-10-CM | POA: Diagnosis not present

## 2017-07-17 DIAGNOSIS — I480 Paroxysmal atrial fibrillation: Secondary | ICD-10-CM | POA: Diagnosis not present

## 2017-07-17 DIAGNOSIS — Z86711 Personal history of pulmonary embolism: Secondary | ICD-10-CM | POA: Diagnosis not present

## 2017-07-17 DIAGNOSIS — Z7901 Long term (current) use of anticoagulants: Secondary | ICD-10-CM | POA: Diagnosis not present

## 2017-07-17 DIAGNOSIS — Z5181 Encounter for therapeutic drug level monitoring: Secondary | ICD-10-CM | POA: Diagnosis not present

## 2017-07-17 MED ORDER — HYDROCODONE-ACETAMINOPHEN 10-325 MG PO TABS: 1.0000 | ORAL_TABLET | Freq: Four times a day (QID) | ORAL | 0 refills | Status: DC | PRN

## 2017-07-17 NOTE — Telephone Encounter (Signed)
Ms. Woodring called [facility] concerned that her pain medication (Norco) is running out and that she needs a refill. Message was sent to physicians with [facility] and follow up visit will be scheduled if needed. [facility]erred pharmacy [facility] [place].  Patterson Hammersmith PharmD PGY1 Pharmacy Practice Resident 07/17/2017 3:17 PM

## 2017-07-17 NOTE — Telephone Encounter (Signed)
Reviewed med list and last office note. Overdue for PCP appt. Will sch first opening with PCP for pain F/U. [place] for 3 month refill.

## 2017-07-18 ENCOUNTER — Telehealth: Payer: Self-pay | Admitting: *Deleted

## 2017-07-18 DIAGNOSIS — J9610 Chronic respiratory failure, unspecified whether with hypoxia or hypercapnia: Secondary | ICD-10-CM | POA: Diagnosis not present

## 2017-07-18 DIAGNOSIS — I480 Paroxysmal atrial fibrillation: Secondary | ICD-10-CM | POA: Diagnosis not present

## 2017-07-18 DIAGNOSIS — S31811D Laceration without foreign body of right buttock, subsequent encounter: Secondary | ICD-10-CM | POA: Diagnosis not present

## 2017-07-18 DIAGNOSIS — I10 Essential (primary) hypertension: Secondary | ICD-10-CM | POA: Diagnosis not present

## 2017-07-18 DIAGNOSIS — M1991 Primary osteoarthritis, unspecified site: Secondary | ICD-10-CM | POA: Diagnosis not present

## 2017-07-18 NOTE — Telephone Encounter (Signed)
F/U WITH [facility]- Pt was seen on 07/13/2017.

## 2017-07-18 NOTE — Telephone Encounter (Signed)
Call from pharmacy requesting refill on pt's warfarin-states they never received electronic refill sent in on 03/25 for warfarin 2.2m tabs .Take 1 tablet (2.5 mg total) by mouth daily. exept take 1.5 tablet (3.75 mg) on Mondays.   Verbal authorization given over the phone. No further action needed, phone complete.GDespina HiddenCassady4/17/20193:18 PM

## 2017-07-20 DIAGNOSIS — J449 Chronic obstructive pulmonary disease, unspecified: Secondary | ICD-10-CM | POA: Diagnosis not present

## 2017-07-20 DIAGNOSIS — I480 Paroxysmal atrial fibrillation: Secondary | ICD-10-CM | POA: Diagnosis not present

## 2017-07-20 DIAGNOSIS — I10 Essential (primary) hypertension: Secondary | ICD-10-CM | POA: Diagnosis not present

## 2017-07-20 DIAGNOSIS — M1991 Primary osteoarthritis, unspecified site: Secondary | ICD-10-CM | POA: Diagnosis not present

## 2017-07-20 DIAGNOSIS — J9610 Chronic respiratory failure, unspecified whether with hypoxia or hypercapnia: Secondary | ICD-10-CM | POA: Diagnosis not present

## 2017-07-20 DIAGNOSIS — S31811D Laceration without foreign body of right buttock, subsequent encounter: Secondary | ICD-10-CM | POA: Diagnosis not present

## 2017-07-23 LAB — POCT INR: INR: 1.9

## 2017-07-23 LAB — PROTIME-INR

## 2017-07-23 NOTE — Patient Instructions (Signed)
Patient educated about medication as defined in this encounter and verbalized understanding by repeating back instructions provided.

## 2017-07-23 NOTE — Addendum Note (Signed)
Addended by: Forde Dandy on: 07/23/2017 10:00 AM   Modules accepted: Orders

## 2017-07-23 NOTE — Progress Notes (Signed)
Anticoagulation Management Jacqueline Nguyen a 84 y.o.femalewho is onwarfarintreatment. She monitors INR at home through [facility], results were faxed in 07/16/17  Indication: DVT and PEhistory, paroxysmal atrial fibrillation Duration: indefinite Supervising physician:James Granfortuna  Patient reports no signs or symptoms of bleeding or thromboembolism.  Anticoagulation Episode Summary    Current INR goal:   2.0-3.0  TTR:   71.0 % (11.7 mo)  Next INR check:   07/23/2017  INR from last check:   2.0 (07/16/2017)  Weekly max warfarin dose:     Target end date:     INR check location:     Preferred lab:     Send INR reminders to:      Indications   History of DVT (deep vein thrombosis) [Z86.718] Anticoagulated on Coumadin [Z79.01] History of pulmonary embolism [Z86.711]       Comments:         Anticoagulation Care Providers    Provider Role Specialty Phone number   [name], MD Responsible Internal Medicine [phone]     Allergies  Allergen Reactions  . Ciprofloxacin   . Bactrim Rash  . Codeine Nausea Only   Medication Sig  acetic acid-hydrocortisone (VOSOL-HC) otic solution Place 3 drops into the right ear 3 (three) times daily. For seven days.  albuterol (PROVENTIL HFA;VENTOLIN HFA) 108 (90 Base) MCG/ACT inhaler Inhale 1-2 puffs into the lungs daily as needed for shortness of breath or wheezing.  Ascorbic Acid (VITAMIN C PO) Take 1 capsule by mouth daily.  atenolol (TENORMIN) 25 MG tablet Take 1 tablet (25 mg total) by mouth 4 (four) times daily as needed. MAY TAKE 1 TABLET 4 TIMES DAILY AS NEEDED.  AZOPT 1 % ophthalmic suspension Place 1 drop into both eyes 2 (two) times daily.  budesonide-formoterol (SYMBICORT) 160-4.5 MCG/ACT inhaler Inhale 2 puffs into the lungs 2 (two) times daily. Rinse mouth after each use  Cholecalciferol (VITAMIN D PO) Take 1 capsule by mouth daily.  clotrimazole (LOTRIMIN) 1 % cream Apply 1 application 2 (two) times daily topically.  Approximately 1 gram to the affected area.  COMBIGAN 0.2-0.5 % ophthalmic solution Place 1 drop into both eyes 2 (two) times daily.  cyanocobalamin 1000 MCG tablet Take 1,000 mcg by mouth daily.  diltiazem (CARDIZEM CD) 120 MG 24 hr capsule TAKE ONE (1) CAPSULE BY MOUTH 2 TIMES DAILY  FLOVENT HFA 44 MCG/ACT inhaler INHALE 1 PUFF TWICE A DAY DAILY  furosemide (LASIX) 20 MG tablet TAKE 1 TABLET (20 MG TOTAL) BY MOUTH DAILY.  HYDROcodone-acetaminophen (NORCO) 10-325 MG tablet Take 1 tablet by mouth every 6 (six) hours as needed for severe pain.  levalbuterol (XOPENEX) 0.63 MG/3ML nebulizer solution Take 3 mLs (0.63 mg total) by nebulization every 8 (eight) hours as needed for wheezing.  meclizine (ANTIVERT) 25 MG tablet Take 1 tablet (25 mg total) by mouth 2 (two) times daily as needed for dizziness. TAKE ONE TABLET 2-3 TIMES A DAY AS NEEDED FOR VERTIGO  Multiple Vitamin (MULTIVITAMIN WITH MINERALS) TABS tablet Take 1 tablet by mouth daily.  Nystatin ([place]) 100000 UNIT/GM POWD Apply topically.  Respiratory Therapy Supplies (FLUTTER) DEVI Blow through 4 times per set, three sets daily  Turmeric (RA TURMERIC) 500 MG CAPS Take 500 mg by mouth at bedtime.  warfarin (COUMADIN) 2.5 MG tablet Take 1 tablet (2.5 mg total) by mouth daily. exept take 1.5 tablet (3.75 mg) on Mondays   Past Medical History:  Diagnosis Date  . Arthritis   . Cataract   . Chronic respiratory failure (  [place])   . Endometriosis   . HTN (hypertension)   . Hx of echocardiogram    Echo (8/15):  Mild LVH, EF 65%, no RWMA, Ao sclerosis, no AS, mod MAC, mild LAE, normal RVSF, PASP 40 mmHg  . Hx pulmonary embolism    on chronic coumadin  . Obesities, morbid ([place])   . OSA (obstructive sleep apnea)   . Osteoporosis   . PAF (paroxysmal atrial fibrillation) ([place])    Event Monitor (8/15):  Frequent PACs, brief bursts of nonsustained ATach; no AFib  . Psoriasis   . Skin cancer   . Vertigo    Social History   Socioeconomic History   . Marital status: Widowed    Spouse name: Not on file  . Number of children: Not on file  . Years of education: Not on file  . Highest education level: Not on file  Occupational History  . Not on file  Social Needs  . Financial resource strain: Not on file  . Food insecurity:    Worry: Not on file    Inability: Not on file  . Transportation needs:    Medical: Not on file    Non-medical: Not on file  Tobacco Use  . Smoking status: Former Smoker    Packs/day: 1.00    Years: 2.00    Pack years: 2.00    Types: Cigarettes    Last attempt to quit: 04/03/1981    Years since quitting: 36.3  . Smokeless tobacco: Never Used  Substance and Sexual Activity  . Alcohol use: No  . Drug use: No  . Sexual activity: Not Currently  Lifestyle  . Physical activity:    Days per week: Not on file    Minutes per session: Not on file  . Stress: Not on file  Relationships  . Social connections:    Talks on phone: Not on file    Gets together: Not on file    Attends religious service: Not on file    Active member of club or organization: Not on file    Attends meetings of clubs or organizations: Not on file    Relationship status: Not on file  Other Topics Concern  . Not on file  Social History Narrative   She lives with her sister in [place] and requires assistance at home. Has an aid who visits and helps at home often. Son works as a Catering manager and also visits and helps when he can. Semi-independent in ADLs, requires walker around home, dependent in IADLs.   Family History  Problem Relation Age of Onset  . Heart attack Father   . Heart attack Brother   . Stroke Neg Hx    ASSESSMENT Recent Results: Lab Results  Component Value Date   INR 2.0 07/16/2017   INR 2.0 07/09/2017   INR 1.8 07/02/2017   INR today: Therapeutic  PLAN Weekly dose was unchanged  There are no Patient Instructions on file for this visit. Patient advised to contact clinic or seek medical attention  if signs/symptoms of bleeding or thromboembolism occur.  Patient verbalized understanding by repeating back information and was advised to contact me if further medication-related questions arise. Patient was also provided an information handout.  Follow-up Return in about 6 days (around 07/23/2017).  [name]

## 2017-07-24 ENCOUNTER — Telehealth: Payer: Self-pay | Admitting: *Deleted

## 2017-07-24 NOTE — Telephone Encounter (Signed)
Call from MD INR - INR 1.9 on 07/23/17.

## 2017-07-25 ENCOUNTER — Telehealth (INDEPENDENT_AMBULATORY_CARE_PROVIDER_SITE_OTHER): Payer: Medicare Other | Admitting: Pharmacist

## 2017-07-25 DIAGNOSIS — J9610 Chronic respiratory failure, unspecified whether with hypoxia or hypercapnia: Secondary | ICD-10-CM | POA: Diagnosis not present

## 2017-07-25 DIAGNOSIS — Z86718 Personal history of other venous thrombosis and embolism: Secondary | ICD-10-CM | POA: Diagnosis not present

## 2017-07-25 DIAGNOSIS — Z7901 Long term (current) use of anticoagulants: Secondary | ICD-10-CM

## 2017-07-25 DIAGNOSIS — I480 Paroxysmal atrial fibrillation: Secondary | ICD-10-CM | POA: Diagnosis not present

## 2017-07-25 DIAGNOSIS — Z5181 Encounter for therapeutic drug level monitoring: Secondary | ICD-10-CM | POA: Diagnosis not present

## 2017-07-25 DIAGNOSIS — I10 Essential (primary) hypertension: Secondary | ICD-10-CM | POA: Diagnosis not present

## 2017-07-25 DIAGNOSIS — Z86711 Personal history of pulmonary embolism: Secondary | ICD-10-CM | POA: Diagnosis not present

## 2017-07-25 DIAGNOSIS — S31811D Laceration without foreign body of right buttock, subsequent encounter: Secondary | ICD-10-CM | POA: Diagnosis not present

## 2017-07-25 DIAGNOSIS — M1991 Primary osteoarthritis, unspecified site: Secondary | ICD-10-CM | POA: Diagnosis not present

## 2017-07-25 NOTE — Progress Notes (Signed)
Anticoagulation Management Jacqueline Nguyen a 84 y.o.femalewho is onwarfarintreatment. She monitors INR at home through [facility]  Indication: DVT and PEhistory, paroxysmal atrial fibrillation Duration: indefinite Supervising physician:Jacqueline Nguyen  Patient reports no signs or symptoms of bleeding or thromboembolism.  Anticoagulation Episode Summary    Current INR goal:   2.0-3.0  TTR:   69.7 % (12 mo)  Next INR check:   07/25/2017  INR from last check:   1.9! (07/23/2017)  Weekly max warfarin dose:     Target end date:     INR check location:     Preferred lab:     Send INR reminders to:      Indications   History of DVT (deep vein thrombosis) [Z86.718] Anticoagulated on Coumadin [Z79.01] History of pulmonary embolism [Z86.711]       Comments:         Anticoagulation Care Providers    Provider Role Specialty Phone number   Jacqueline Falcon, MD Responsible Internal Medicine [phone]     Allergies  Allergen Reactions  . Ciprofloxacin   . Bactrim Rash  . Codeine Nausea Only   Medication Sig  acetic acid-hydrocortisone (VOSOL-HC) otic solution Place 3 drops into the right ear 3 (three) times daily. For seven days.  albuterol (PROVENTIL HFA;VENTOLIN HFA) 108 (90 Base) MCG/ACT inhaler Inhale 1-2 puffs into the lungs daily as needed for shortness of breath or wheezing.  Ascorbic Acid (VITAMIN C PO) Take 1 capsule by mouth daily.  atenolol (TENORMIN) 25 MG tablet Take 1 tablet (25 mg total) by mouth 4 (four) times daily as needed. MAY TAKE 1 TABLET 4 TIMES DAILY AS NEEDED.  AZOPT 1 % ophthalmic suspension Place 1 drop into both eyes 2 (two) times daily.  budesonide-formoterol (SYMBICORT) 160-4.5 MCG/ACT inhaler Inhale 2 puffs into the lungs 2 (two) times daily. Rinse mouth after each use  Cholecalciferol (VITAMIN D PO) Take 1 capsule by mouth daily.  clotrimazole (LOTRIMIN) 1 % cream Apply 1 application 2 (two) times daily topically. Approximately 1 gram to the affected  area.  COMBIGAN 0.2-0.5 % ophthalmic solution Place 1 drop into both eyes 2 (two) times daily.  cyanocobalamin 1000 MCG tablet Take 1,000 mcg by mouth daily.  diltiazem (CARDIZEM CD) 120 MG 24 hr capsule TAKE ONE (1) CAPSULE BY MOUTH 2 TIMES DAILY  FLOVENT HFA 44 MCG/ACT inhaler INHALE 1 PUFF TWICE A DAY DAILY  furosemide (LASIX) 20 MG tablet TAKE 1 TABLET (20 MG TOTAL) BY MOUTH DAILY.  HYDROcodone-acetaminophen (NORCO) 10-325 MG tablet Take 1 tablet by mouth every 6 (six) hours as needed for severe pain.  levalbuterol (XOPENEX) 0.63 MG/3ML nebulizer solution Take 3 mLs (0.63 mg total) by nebulization every 8 (eight) hours as needed for wheezing.  meclizine (ANTIVERT) 25 MG tablet Take 1 tablet (25 mg total) by mouth 2 (two) times daily as needed for dizziness. TAKE ONE TABLET 2-3 TIMES A DAY AS NEEDED FOR VERTIGO  Multiple Vitamin (MULTIVITAMIN WITH MINERALS) TABS tablet Take 1 tablet by mouth daily.  Nystatin (Jacqueline Nguyen) 100000 UNIT/GM POWD Apply topically.  Respiratory Therapy Supplies (FLUTTER) DEVI Blow through 4 times per set, three sets daily  Turmeric (RA TURMERIC) 500 MG CAPS Take 500 mg by mouth at bedtime.  warfarin (COUMADIN) 2.5 MG tablet Take 1 tablet (2.5 mg total) by mouth daily. exept take 1.5 tablet (3.75 mg) on Mondays   Past Medical History:  Diagnosis Date  . Arthritis   . Cataract   . Chronic respiratory failure ([place])   . Endometriosis   .  HTN (hypertension)   . Hx of echocardiogram    Echo (8/15):  Mild LVH, EF 65%, no RWMA, Ao sclerosis, no AS, mod MAC, mild LAE, normal RVSF, PASP 40 mmHg  . Hx pulmonary embolism    on chronic coumadin  . Obesities, morbid ([place])   . OSA (obstructive sleep apnea)   . Osteoporosis   . PAF (paroxysmal atrial fibrillation) ([place])    Event Monitor (8/15):  Frequent PACs, brief bursts of nonsustained ATach; no AFib  . Psoriasis   . Skin cancer   . Vertigo    Social History   Socioeconomic History  . Marital status: Widowed     Spouse name: Not on file  . Number of children: Not on file  . Years of education: Not on file  . Highest education level: Not on file  Occupational History  . Not on file  Social Needs  . Financial resource strain: Not on file  . Food insecurity:    Worry: Not on file    Inability: Not on file  . Transportation needs:    Medical: Not on file    Non-medical: Not on file  Tobacco Use  . Smoking status: Former Smoker    Packs/day: 1.00    Years: 2.00    Pack years: 2.00    Types: Cigarettes    Last attempt to quit: 04/03/1981    Years since quitting: 36.3  . Smokeless tobacco: Never Used  Substance and Sexual Activity  . Alcohol use: No  . Drug use: No  . Sexual activity: Not Currently  Lifestyle  . Physical activity:    Days per week: Not on file    Minutes per session: Not on file  . Stress: Not on file  Relationships  . Social connections:    Talks on phone: Not on file    Gets together: Not on file    Attends religious service: Not on file    Active member of club or organization: Not on file    Attends meetings of clubs or organizations: Not on file    Relationship status: Not on file  Other Topics Concern  . Not on file  Social History Narrative   She lives with her sister in [place] and requires assistance at home. Has an aid who visits and helps at home often. Son works as a Catering manager and also visits and helps when he can. Semi-independent in ADLs, requires walker around home, dependent in IADLs.   Family History  Problem Relation Age of Onset  . Heart attack Father   . Heart attack Brother   . Stroke Neg Hx    ASSESSMENT Recent Results: Lab Results  Component Value Date   INR 1.9 07/23/2017   INR 2.0 07/16/2017   INR 2.0 07/09/2017   PLAN Weekly dose was unchanged  There are no Patient Instructions on file for this visit. Patient advised to contact clinic or seek medical attention if signs/symptoms of bleeding or thromboembolism  occur.  Patient verbalized understanding by repeating back information and was advised to contact me if further medication-related questions arise. Patient was also provided an information handout.  Follow-up 1 week  Jacqueline Nguyen

## 2017-07-25 NOTE — Telephone Encounter (Signed)
Also informed Dr Maudie Mercury yesterday  of result.

## 2017-07-27 ENCOUNTER — Other Ambulatory Visit: Payer: Self-pay | Admitting: Internal Medicine

## 2017-07-27 DIAGNOSIS — J449 Chronic obstructive pulmonary disease, unspecified: Secondary | ICD-10-CM

## 2017-07-27 DIAGNOSIS — J9610 Chronic respiratory failure, unspecified whether with hypoxia or hypercapnia: Secondary | ICD-10-CM | POA: Diagnosis not present

## 2017-07-27 DIAGNOSIS — I10 Essential (primary) hypertension: Secondary | ICD-10-CM | POA: Diagnosis not present

## 2017-07-27 DIAGNOSIS — M1991 Primary osteoarthritis, unspecified site: Secondary | ICD-10-CM | POA: Diagnosis not present

## 2017-07-27 DIAGNOSIS — S31811D Laceration without foreign body of right buttock, subsequent encounter: Secondary | ICD-10-CM | POA: Diagnosis not present

## 2017-07-27 DIAGNOSIS — I480 Paroxysmal atrial fibrillation: Secondary | ICD-10-CM | POA: Diagnosis not present

## 2017-07-28 DIAGNOSIS — G4733 Obstructive sleep apnea (adult) (pediatric): Secondary | ICD-10-CM | POA: Diagnosis not present

## 2017-07-30 DIAGNOSIS — I480 Paroxysmal atrial fibrillation: Secondary | ICD-10-CM | POA: Diagnosis not present

## 2017-07-30 DIAGNOSIS — Z7901 Long term (current) use of anticoagulants: Secondary | ICD-10-CM | POA: Diagnosis not present

## 2017-07-30 LAB — POCT INR: INR: 2.4

## 2017-07-31 ENCOUNTER — Encounter: Payer: Self-pay | Admitting: Pharmacist

## 2017-07-31 DIAGNOSIS — J9610 Chronic respiratory failure, unspecified whether with hypoxia or hypercapnia: Secondary | ICD-10-CM | POA: Diagnosis not present

## 2017-07-31 DIAGNOSIS — M1991 Primary osteoarthritis, unspecified site: Secondary | ICD-10-CM | POA: Diagnosis not present

## 2017-07-31 DIAGNOSIS — Z86718 Personal history of other venous thrombosis and embolism: Secondary | ICD-10-CM

## 2017-07-31 DIAGNOSIS — Z7901 Long term (current) use of anticoagulants: Secondary | ICD-10-CM

## 2017-07-31 DIAGNOSIS — I10 Essential (primary) hypertension: Secondary | ICD-10-CM | POA: Diagnosis not present

## 2017-07-31 DIAGNOSIS — Z86711 Personal history of pulmonary embolism: Secondary | ICD-10-CM

## 2017-07-31 DIAGNOSIS — S31811D Laceration without foreign body of right buttock, subsequent encounter: Secondary | ICD-10-CM | POA: Diagnosis not present

## 2017-07-31 DIAGNOSIS — I480 Paroxysmal atrial fibrillation: Secondary | ICD-10-CM | POA: Diagnosis not present

## 2017-07-31 NOTE — Progress Notes (Signed)
Anticoagulation Management Jacqueline Nguyen a 84 y.o.femalewho is onwarfarintreatment. She monitors INR at home through [facility], results were faxed in 07/30/17  Indication: DVT and PEhistory, paroxysmal atrial fibrillation Duration: indefinite Supervising [place]  Patient reports no signs or symptoms of bleeding or thromboembolism.  Anticoagulation Episode Summary             Current INR goal:   2.0-3.0  TTR:   70.5 % (11.5 mo)  Next INR check:   08/06/2017  INR from last check:   2.4 (07/30/2017)  Weekly max warfarin dose:     Target end date:     INR check location:     Preferred lab:     Send INR reminders to:      Indications   History of DVT (deep vein thrombosis) [Z86.718] Anticoagulated on Coumadin [Z79.01] History of pulmonary embolism [Z86.711]          Anticoagulation Care Providers    Provider Role Specialty Phone number   [name], MD Responsible Internal Medicine [phone]     Allergies  Allergen Reactions  . Ciprofloxacin   . Bactrim Rash  . Codeine Nausea Only   Medication Sig  acetic acid-hydrocortisone (VOSOL-HC) otic solution Place 3 drops into the right ear 3 (three) times daily. For seven days.  albuterol (PROVENTIL HFA;VENTOLIN HFA) 108 (90 Base) MCG/ACT inhaler Inhale 1-2 puffs into the lungs daily as needed for shortness of breath or wheezing.  Ascorbic Acid (VITAMIN C PO) Take 1 capsule by mouth daily.  atenolol (TENORMIN) 25 MG tablet Take 1 tablet (25 mg total) by mouth 4 (four) times daily as needed. MAY TAKE 1 TABLET 4 TIMES DAILY AS NEEDED.  AZOPT 1 % ophthalmic suspension Place 1 drop into both eyes 2 (two) times daily.  budesonide-formoterol (SYMBICORT) 160-4.5 MCG/ACT inhaler Inhale 2 puffs into the lungs 2 (two) times daily. Rinse mouth after each use  Cholecalciferol (VITAMIN D PO) Take 1 capsule by mouth daily.  clotrimazole (LOTRIMIN) 1 % cream Apply 1 application 2 (two) times daily  topically. Approximately 1 gram to the affected area.  COMBIGAN 0.2-0.5 % ophthalmic solution Place 1 drop into both eyes 2 (two) times daily.  cyanocobalamin 1000 MCG tablet Take 1,000 mcg by mouth daily.  diltiazem (CARDIZEM CD) 120 MG 24 hr capsule TAKE ONE (1) CAPSULE BY MOUTH 2 TIMES DAILY  FLOVENT HFA 44 MCG/ACT inhaler INHALE 1 PUFF TWICE A DAY DAILY  furosemide (LASIX) 20 MG tablet TAKE 1 TABLET (20 MG TOTAL) BY MOUTH DAILY.  HYDROcodone-acetaminophen (NORCO) 10-325 MG tablet Take 1 tablet by mouth every 6 (six) hours as needed for severe pain.  levalbuterol (XOPENEX) 0.63 MG/3ML nebulizer solution Take 3 mLs (0.63 mg total) by nebulization every 8 (eight) hours as needed for wheezing.  meclizine (ANTIVERT) 25 MG tablet Take 1 tablet (25 mg total) by mouth 2 (two) times daily as needed for dizziness. TAKE ONE TABLET 2-3 TIMES A DAY AS NEEDED FOR VERTIGO  Multiple Vitamin (MULTIVITAMIN WITH MINERALS) TABS tablet Take 1 tablet by mouth daily.  Nystatin ([place]) 100000 UNIT/GM POWD Apply topically.  Respiratory Therapy Supplies (FLUTTER) DEVI Blow through 4 times per set, three sets daily  Turmeric (RA TURMERIC) 500 MG CAPS Take 500 mg by mouth at bedtime.  warfarin (COUMADIN) 2.5 MG tablet Take 1 tablet (2.5 mg total) by mouth daily. exept take 1.5 tablet (3.75 mg) on Mondays   Past Medical History:  Diagnosis Date  . Arthritis   . Cataract   .  Chronic respiratory failure ([place])   . Endometriosis   . HTN (hypertension)   . Hx of echocardiogram    Echo (8/15):  Mild LVH, EF 65%, no RWMA, Ao sclerosis, no AS, mod MAC, mild LAE, normal RVSF, PASP 40 mmHg  . Hx pulmonary embolism    on chronic coumadin  . Obesities, morbid ([place])   . OSA (obstructive sleep apnea)   . Osteoporosis   . PAF (paroxysmal atrial fibrillation) ([place])    Event Monitor (8/15):  Frequent PACs, brief bursts of nonsustained ATach; no AFib  . Psoriasis   . Skin cancer   . Vertigo    Social  History   Socioeconomic History  . Marital status: Widowed    Spouse name: Not on file  . Number of children: Not on file  . Years of education: Not on file  . Highest education level: Not on file  Occupational History  . Not on file  Social Needs  . Financial resource strain: Not on file  . Food insecurity:    Worry: Not on file    Inability: Not on file  . Transportation needs:    Medical: Not on file    Non-medical: Not on file  Tobacco Use  . Smoking status: Former Smoker    Packs/day: 1.00    Years: 2.00    Pack years: 2.00    Types: Cigarettes    Last attempt to quit: 04/03/1981    Years since quitting: 36.2  . Smokeless tobacco: Never Used  Substance and Sexual Activity  . Alcohol use: No  . Drug use: No  . Sexual activity: Not Currently  Lifestyle  . Physical activity:    Days per week: Not on file    Minutes per session: Not on file  . Stress: Not on file  Relationships  . Social connections:    Talks on phone: Not on file    Gets together: Not on file    Attends religious service: Not on file    Active member of club or organization: Not on file    Attends meetings of clubs or organizations: Not on file    Relationship status: Not on file  Other Topics Concern  . Not on file  Social History Narrative   She lives with her sister in [place] and requires assistance at home. Has an aid who visits and helps at home often. Son works as a Catering manager and also visits and helps when he can. Semi-independent in ADLs, requires walker around home, dependent in IADLs.   Family History  Problem Relation Age of Onset  . Heart attack Father   . Heart attack Brother   . Stroke Neg Hx    ASSESSMENT Lab Results  Component Value Date   INR 2.4 07/31/2017   INR 2.0 07/09/2017   INR 1.8 07/02/2017   INR 2.2 06/25/2017     Anticoagulation Dosing: 3.75 mg Mondays, 2.5 mg all other days   INR today:  Therapeutic  PLAN Weekly dose was unchanged  There are no Patient Instructions on file for this visit. Patient advised to contact clinic or seek medical attention if signs/symptoms of bleeding or thromboembolism occur.  Patient verbalized understanding by repeating back information and was advised to contact me if further medication-related questions arise. Patient was also provided an information handout.  Follow-up 1 week  [name]

## 2017-08-02 DIAGNOSIS — M1991 Primary osteoarthritis, unspecified site: Secondary | ICD-10-CM | POA: Diagnosis not present

## 2017-08-02 DIAGNOSIS — J9610 Chronic respiratory failure, unspecified whether with hypoxia or hypercapnia: Secondary | ICD-10-CM | POA: Diagnosis not present

## 2017-08-02 DIAGNOSIS — I10 Essential (primary) hypertension: Secondary | ICD-10-CM | POA: Diagnosis not present

## 2017-08-02 DIAGNOSIS — S31811D Laceration without foreign body of right buttock, subsequent encounter: Secondary | ICD-10-CM | POA: Diagnosis not present

## 2017-08-02 DIAGNOSIS — I480 Paroxysmal atrial fibrillation: Secondary | ICD-10-CM | POA: Diagnosis not present

## 2017-08-02 LAB — PROTIME-INR

## 2017-08-04 DIAGNOSIS — G4733 Obstructive sleep apnea (adult) (pediatric): Secondary | ICD-10-CM | POA: Diagnosis not present

## 2017-08-07 ENCOUNTER — Encounter: Payer: Self-pay | Admitting: Pharmacist

## 2017-08-07 ENCOUNTER — Telehealth: Payer: Self-pay | Admitting: *Deleted

## 2017-08-07 DIAGNOSIS — Z7901 Long term (current) use of anticoagulants: Secondary | ICD-10-CM

## 2017-08-07 DIAGNOSIS — Z86711 Personal history of pulmonary embolism: Secondary | ICD-10-CM

## 2017-08-07 DIAGNOSIS — I82402 Acute embolism and thrombosis of unspecified deep veins of left lower extremity: Secondary | ICD-10-CM

## 2017-08-07 DIAGNOSIS — Z86718 Personal history of other venous thrombosis and embolism: Secondary | ICD-10-CM

## 2017-08-07 DIAGNOSIS — I480 Paroxysmal atrial fibrillation: Secondary | ICD-10-CM

## 2017-08-07 LAB — POCT INR: INR: 1.9

## 2017-08-07 NOTE — Progress Notes (Signed)
Anticoagulation Management Jacqueline Nguyen a 84 y.o.femalewho is onwarfarintreatment. She monitors INR at home through [facility], results were faxed in 07/30/17  Indication: DVT and PEhistory, paroxysmal atrial fibrillation Duration: indefinite Supervising physician:Erik Hoffman  Patient reports no signs or symptoms of bleeding or thromboembolism.  Anticoagulation Episode Summary    Current INR goal:   2.0-3.0  TTR:   70.1 % (1 y)  Next INR check:   08/13/2017  INR from last check:   1.9! (08/07/2017)  Weekly max warfarin dose:     Target end date:     INR check location:     Preferred lab:     Send INR reminders to:      Indications   History of DVT (deep vein thrombosis) [Z86.718] Anticoagulated on Coumadin [Z79.01] History of pulmonary embolism [Z86.711]       Comments:         Anticoagulation Care Providers    Provider Role Specialty Phone number   [name], MD Responsible Internal Medicine [phone]     Allergies  Allergen Reactions  . Ciprofloxacin   . Bactrim Rash  . Codeine Nausea Only   Medication Sig  acetic acid-hydrocortisone (VOSOL-HC) otic solution Place 3 drops into the right ear 3 (three) times daily. For seven days.  albuterol (PROVENTIL HFA;VENTOLIN HFA) 108 (90 Base) MCG/ACT inhaler Inhale 1-2 puffs into the lungs daily as needed for shortness of breath or wheezing.  Ascorbic Acid (VITAMIN C PO) Take 1 capsule by mouth daily.  atenolol (TENORMIN) 25 MG tablet Take 1 tablet (25 mg total) by mouth 4 (four) times daily as needed. MAY TAKE 1 TABLET 4 TIMES DAILY AS NEEDED.  AZOPT 1 % ophthalmic suspension Place 1 drop into both eyes 2 (two) times daily.  budesonide-formoterol (SYMBICORT) 160-4.5 MCG/ACT inhaler Inhale 2 puffs into the lungs every 12 (twelve) hours.  Cholecalciferol (VITAMIN D PO) Take 1 capsule by mouth daily.  clotrimazole (LOTRIMIN) 1 % cream Apply 1 application 2 (two) times daily topically. Approximately 1 gram to the  affected area.  COMBIGAN 0.2-0.5 % ophthalmic solution Place 1 drop into both eyes 2 (two) times daily.  cyanocobalamin 1000 MCG tablet Take 1,000 mcg by mouth daily.  diltiazem (CARDIZEM CD) 120 MG 24 hr capsule TAKE ONE (1) CAPSULE BY MOUTH 2 TIMES DAILY  FLOVENT HFA 44 MCG/ACT inhaler INHALE 1 PUFF TWICE A DAY DAILY  furosemide (LASIX) 20 MG tablet TAKE 1 TABLET (20 MG TOTAL) BY MOUTH DAILY.  HYDROcodone-acetaminophen (NORCO) 10-325 MG tablet Take 1 tablet by mouth every 6 (six) hours as needed for severe pain.  levalbuterol (XOPENEX) 0.63 MG/3ML nebulizer solution Take 3 mLs (0.63 mg total) by nebulization every 8 (eight) hours as needed for wheezing.  meclizine (ANTIVERT) 25 MG tablet Take 1 tablet (25 mg total) by mouth 2 (two) times daily as needed for dizziness. TAKE ONE TABLET 2-3 TIMES A DAY AS NEEDED FOR VERTIGO  Multiple Vitamin (MULTIVITAMIN WITH MINERALS) TABS tablet Take 1 tablet by mouth daily.  Nystatin ([place]) 100000 UNIT/GM POWD Apply topically.  Respiratory Therapy Supplies (FLUTTER) DEVI Blow through 4 times per set, three sets daily  Turmeric (RA TURMERIC) 500 MG CAPS Take 500 mg by mouth at bedtime.  warfarin (COUMADIN) 2.5 MG tablet Take 1 tablet (2.5 mg total) by mouth daily. exept take 1.5 tablet (3.75 mg) on Mondays   Past Medical History:  Diagnosis Date  . Arthritis   . Cataract   . Chronic respiratory failure ([place])   . Endometriosis   .  HTN (hypertension)   . Hx of echocardiogram    Echo (8/15):  Mild LVH, EF 65%, no RWMA, Ao sclerosis, no AS, mod MAC, mild LAE, normal RVSF, PASP 40 mmHg  . Hx pulmonary embolism    on chronic coumadin  . Obesities, morbid ([place])   . OSA (obstructive sleep apnea)   . Osteoporosis   . PAF (paroxysmal atrial fibrillation) ([place])    Event Monitor (8/15):  Frequent PACs, brief bursts of nonsustained ATach; no AFib  . Psoriasis   . Skin cancer   . Vertigo    Social History   Socioeconomic History  . Marital status: Widowed     Spouse name: Not on file  . Number of children: Not on file  . Years of education: Not on file  . Highest education level: Not on file  Occupational History  . Not on file  Social Needs  . Financial resource strain: Not on file  . Food insecurity:    Worry: Not on file    Inability: Not on file  . Transportation needs:    Medical: Not on file    Non-medical: Not on file  Tobacco Use  . Smoking status: Former Smoker    Packs/day: 1.00    Years: 2.00    Pack years: 2.00    Types: Cigarettes    Last attempt to quit: 04/03/1981    Years since quitting: 36.3  . Smokeless tobacco: Never Used  Substance and Sexual Activity  . Alcohol use: No  . Drug use: No  . Sexual activity: Not Currently  Lifestyle  . Physical activity:    Days per week: Not on file    Minutes per session: Not on file  . Stress: Not on file  Relationships  . Social connections:    Talks on phone: Not on file    Gets together: Not on file    Attends religious service: Not on file    Active member of club or organization: Not on file    Attends meetings of clubs or organizations: Not on file    Relationship status: Not on file  Other Topics Concern  . Not on file  Social History Narrative   She lives with her sister in [place] and requires assistance at home. Has an aid who visits and helps at home often. Son works as a Catering manager and also visits and helps when he can. Semi-independent in ADLs, requires walker around home, dependent in IADLs.   Family History  Problem Relation Age of Onset  . Heart attack Father   . Heart attack Brother   . Stroke Neg Hx    ASSESSMENT Lab Results  Component Value Date   INR 1.9 08/07/2017   INR 2.4 07/30/2017   INR 1.9 07/23/2017   Anticoagulation Dosing: 3.75 mg Mondays, 2.5 mg all other days. She reports changing this dose---takes 2.5 mg daily unless INR is low.   INR today: Therapeutic  PLAN Weekly dose was unchanged, patient was advised to stick  to dosing as listed above (3.75 mg on Mondays, 2.5 mg all other days). Patient verbalized understanding.  Patient Instructions  Patient educated about medication as defined in this encounter and verbalized understanding by repeating back instructions provided.   Patient advised to contact clinic or seek medical attention if signs/symptoms of bleeding or thromboembolism occur.  Patient verbalized understanding by repeating back information and was advised to contact me if further medication-related questions arise. Patient was also provided an information  handout.  Follow-up Return in about 1 week (around 08/14/2017).  [name]

## 2017-08-07 NOTE — Patient Instructions (Signed)
Patient educated about medication as defined in this encounter and verbalized understanding by repeating back instructions provided.

## 2017-08-07 NOTE — Telephone Encounter (Signed)
[facility]:  Reports INR 08/06/2017 at 1854 is: 1.9 Sending to dr kim Attending Dr Daryll Drown triage

## 2017-08-08 DIAGNOSIS — S31811D Laceration without foreign body of right buttock, subsequent encounter: Secondary | ICD-10-CM | POA: Diagnosis not present

## 2017-08-08 DIAGNOSIS — M1991 Primary osteoarthritis, unspecified site: Secondary | ICD-10-CM | POA: Diagnosis not present

## 2017-08-08 DIAGNOSIS — I480 Paroxysmal atrial fibrillation: Secondary | ICD-10-CM | POA: Diagnosis not present

## 2017-08-08 DIAGNOSIS — I10 Essential (primary) hypertension: Secondary | ICD-10-CM | POA: Diagnosis not present

## 2017-08-08 DIAGNOSIS — J9610 Chronic respiratory failure, unspecified whether with hypoxia or hypercapnia: Secondary | ICD-10-CM | POA: Diagnosis not present

## 2017-08-08 LAB — PROTIME-INR

## 2017-08-08 MED ORDER — WARFARIN SODIUM 2.5 MG PO TABS: 2.5000 mg | ORAL_TABLET | Freq: Every day | ORAL | 3 refills | Status: DC

## 2017-08-08 NOTE — Telephone Encounter (Signed)
OK thanks

## 2017-08-08 NOTE — Progress Notes (Signed)
INTERNAL MEDICINE TEACHING ATTENDING ADDENDUM - Lucious Groves, DO Duration- indefinate, Indication-  afib/VTE, INR-  Lab Results  Component Value Date   INR 1.9 08/07/2017  . Agree with pharmacy recommendations as outlined in their note.

## 2017-08-08 NOTE — Telephone Encounter (Signed)
Spoke w/ dr Maudie Mercury, she has addressed this

## 2017-08-08 NOTE — Addendum Note (Signed)
Addended by: Forde Dandy on: 08/08/2017 02:10 AM   Modules accepted: Orders

## 2017-08-10 DIAGNOSIS — G4733 Obstructive sleep apnea (adult) (pediatric): Secondary | ICD-10-CM | POA: Diagnosis not present

## 2017-08-13 LAB — POCT INR: INR: 1.7

## 2017-08-14 ENCOUNTER — Encounter: Payer: Self-pay | Admitting: Pharmacist

## 2017-08-14 ENCOUNTER — Telehealth: Payer: Self-pay | Admitting: *Deleted

## 2017-08-14 DIAGNOSIS — Z86711 Personal history of pulmonary embolism: Secondary | ICD-10-CM

## 2017-08-14 DIAGNOSIS — I82402 Acute embolism and thrombosis of unspecified deep veins of left lower extremity: Secondary | ICD-10-CM

## 2017-08-14 DIAGNOSIS — Z7901 Long term (current) use of anticoagulants: Secondary | ICD-10-CM

## 2017-08-14 DIAGNOSIS — I480 Paroxysmal atrial fibrillation: Secondary | ICD-10-CM

## 2017-08-14 DIAGNOSIS — Z86718 Personal history of other venous thrombosis and embolism: Secondary | ICD-10-CM

## 2017-08-14 LAB — PROTIME-INR

## 2017-08-14 MED ORDER — WARFARIN SODIUM 2.5 MG PO TABS: 2.5000 mg | ORAL_TABLET | Freq: Every day | ORAL | 3 refills | Status: DC

## 2017-08-14 NOTE — Progress Notes (Signed)
INTERNAL MEDICINE TEACHING ATTENDING ADDENDUM - Lucious Groves, DO Duration- indefinate, Indication- A fib/VTE, INR-  Lab Results  Component Value Date   INR 1.7 08/13/2017  . Agree with pharmacy recommendations as outlined in their note.

## 2017-08-14 NOTE — Telephone Encounter (Signed)
Received call from [place] with [facility] reporting out of range INR on 08/13/2017 = 1.7. Will route to PCP and Dr. Maudie Mercury. Hubbard Hartshorn, RN, BSN

## 2017-08-14 NOTE — Progress Notes (Addendum)
Anticoagulation Management Jacqueline Nguyen a 84 y.o.femalewho is onwarfarintreatment. She monitors INR at home through [facility], results were faxed in.  Indication: DVT and PEhistory, paroxysmal atrial fibrillation Duration: indefinite Supervising physician:Erik Hoffman  Patient reports no signs or symptoms of bleeding or thromboembolism.  Anticoagulation Episode Summary    Current INR goal:   2.0-3.0  TTR:   69.0 % (1 y)  Next INR check:   08/20/2017  INR from last check:   1.7! (08/13/2017)  Weekly max warfarin dose:     Target end date:     INR check location:     Preferred lab:     Send INR reminders to:      Indications   History of DVT (deep vein thrombosis) [Z86.718] Anticoagulated on Coumadin [Z79.01] History of pulmonary embolism [Z86.711]       Comments:         Anticoagulation Care Providers    Provider Role Specialty Phone number   [name], MD Responsible Internal Medicine [phone]     Allergies  Allergen Reactions  . Ciprofloxacin   . Bactrim Rash  . Codeine Nausea Only   Medication Sig  acetic acid-hydrocortisone (VOSOL-HC) otic solution Place 3 drops into the right ear 3 (three) times daily. For seven days.  albuterol (PROVENTIL HFA;VENTOLIN HFA) 108 (90 Base) MCG/ACT inhaler Inhale 1-2 puffs into the lungs daily as needed for shortness of breath or wheezing.  Ascorbic Acid (VITAMIN C PO) Take 1 capsule by mouth daily.  atenolol (TENORMIN) 25 MG tablet Take 1 tablet (25 mg total) by mouth 4 (four) times daily as needed. MAY TAKE 1 TABLET 4 TIMES DAILY AS NEEDED.  AZOPT 1 % ophthalmic suspension Place 1 drop into both eyes 2 (two) times daily.  budesonide-formoterol (SYMBICORT) 160-4.5 MCG/ACT inhaler Inhale 2 puffs into the lungs every 12 (twelve) hours.  Cholecalciferol (VITAMIN D PO) Take 1 capsule by mouth daily.  clotrimazole (LOTRIMIN) 1 % cream Apply 1 application 2 (two) times daily topically. Approximately 1 gram to the affected  area.  COMBIGAN 0.2-0.5 % ophthalmic solution Place 1 drop into both eyes 2 (two) times daily.  cyanocobalamin 1000 MCG tablet Take 1,000 mcg by mouth daily.  diltiazem (CARDIZEM CD) 120 MG 24 hr capsule TAKE ONE (1) CAPSULE BY MOUTH 2 TIMES DAILY  FLOVENT HFA 44 MCG/ACT inhaler INHALE 1 PUFF TWICE A DAY DAILY  furosemide (LASIX) 20 MG tablet TAKE 1 TABLET (20 MG TOTAL) BY MOUTH DAILY.  HYDROcodone-acetaminophen (NORCO) 10-325 MG tablet Take 1 tablet by mouth every 6 (six) hours as needed for severe pain.  levalbuterol (XOPENEX) 0.63 MG/3ML nebulizer solution Take 3 mLs (0.63 mg total) by nebulization every 8 (eight) hours as needed for wheezing.  meclizine (ANTIVERT) 25 MG tablet Take 1 tablet (25 mg total) by mouth 2 (two) times daily as needed for dizziness. TAKE ONE TABLET 2-3 TIMES A DAY AS NEEDED FOR VERTIGO  Multiple Vitamin (MULTIVITAMIN WITH MINERALS) TABS tablet Take 1 tablet by mouth daily.  Nystatin ([place]) 100000 UNIT/GM POWD Apply topically.  Respiratory Therapy Supplies (FLUTTER) DEVI Blow through 4 times per set, three sets daily  Turmeric (RA TURMERIC) 500 MG CAPS Take 500 mg by mouth at bedtime.  warfarin (COUMADIN) 2.5 MG tablet Take 1 tablet (2.5 mg total) by mouth daily. exept take 1.5 tablet (3.75 mg) on Mondays   Past Medical History:  Diagnosis Date  . Arthritis   . Cataract   . Chronic respiratory failure ([place])   . Endometriosis   .  HTN (hypertension)   . Hx of echocardiogram    Echo (8/15):  Mild LVH, EF 65%, no RWMA, Ao sclerosis, no AS, mod MAC, mild LAE, normal RVSF, PASP 40 mmHg  . Hx pulmonary embolism    on chronic coumadin  . Obesities, morbid ([place])   . OSA (obstructive sleep apnea)   . Osteoporosis   . PAF (paroxysmal atrial fibrillation) ([place])    Event Monitor (8/15):  Frequent PACs, brief bursts of nonsustained ATach; no AFib  . Psoriasis   . Skin cancer   . Vertigo    Social History   Socioeconomic History  . Marital status: Widowed     Spouse name: Not on file  . Number of children: Not on file  . Years of education: Not on file  . Highest education level: Not on file  Occupational History  . Not on file  Social Needs  . Financial resource strain: Not on file  . Food insecurity:    Worry: Not on file    Inability: Not on file  . Transportation needs:    Medical: Not on file    Non-medical: Not on file  Tobacco Use  . Smoking status: Former Smoker    Packs/day: 1.00    Years: 2.00    Pack years: 2.00    Types: Cigarettes    Last attempt to quit: 04/03/1981    Years since quitting: 36.3  . Smokeless tobacco: Never Used  Substance and Sexual Activity  . Alcohol use: No  . Drug use: No  . Sexual activity: Not Currently  Lifestyle  . Physical activity:    Days per week: Not on file    Minutes per session: Not on file  . Stress: Not on file  Relationships  . Social connections:    Talks on phone: Not on file    Gets together: Not on file    Attends religious service: Not on file    Active member of club or organization: Not on file    Attends meetings of clubs or organizations: Not on file    Relationship status: Not on file  Other Topics Concern  . Not on file  Social History Narrative   She lives with her sister in [place] and requires assistance at home. Has an aid who visits and helps at home often. Son works as a Catering manager and also visits and helps when he can. Semi-independent in ADLs, requires walker around home, dependent in IADLs.   Family History  Problem Relation Age of Onset  . Heart attack Father   . Heart attack Brother   . Stroke Neg Hx    ASSESSMENT Recent Results: Lab Results  Component Value Date   INR 1.7 08/13/2017   INR 1.9 08/07/2017   INR 2.4 07/30/2017   Anticoagulation Dosing: 2.5 mg daily, except 3.75 mg on Mondays   INR today: Subtherapeutic  PLAN Patient only took 1 tablet yesterday rather than the 1 and 1/2 tablets she was instructed. Educated on  adherence and importance of taking 1.5 tablets (3.75 mg) on Mondays. Weekly dose was unchanged  There are no Patient Instructions on file for this visit. Patient advised to contact clinic or seek medical attention if signs/symptoms of bleeding or thromboembolism occur.  Patient verbalized understanding by repeating back information and was advised to contact me if further medication-related questions arise. Patient was also provided an information handout.  Follow-up Return in about 1 week (around 08/21/2017).  [name]

## 2017-08-14 NOTE — Telephone Encounter (Signed)
Dr Maudie Mercury has seen the patient today and made appropriate adjustments.

## 2017-08-15 ENCOUNTER — Other Ambulatory Visit: Payer: Self-pay

## 2017-08-15 ENCOUNTER — Ambulatory Visit (INDEPENDENT_AMBULATORY_CARE_PROVIDER_SITE_OTHER): Payer: Medicare Other | Admitting: Internal Medicine

## 2017-08-15 VITALS — BP 162/62 | HR 66 | Temp 97.7°F | Ht 65.0 in | Wt 240.0 lb

## 2017-08-15 DIAGNOSIS — Z9119 Patient's noncompliance with other medical treatment and regimen: Secondary | ICD-10-CM

## 2017-08-15 DIAGNOSIS — G4700 Insomnia, unspecified: Secondary | ICD-10-CM

## 2017-08-15 DIAGNOSIS — R5383 Other fatigue: Secondary | ICD-10-CM

## 2017-08-15 DIAGNOSIS — G8929 Other chronic pain: Secondary | ICD-10-CM

## 2017-08-15 DIAGNOSIS — M549 Dorsalgia, unspecified: Secondary | ICD-10-CM

## 2017-08-15 DIAGNOSIS — F419 Anxiety disorder, unspecified: Secondary | ICD-10-CM | POA: Diagnosis not present

## 2017-08-15 DIAGNOSIS — I509 Heart failure, unspecified: Secondary | ICD-10-CM

## 2017-08-15 DIAGNOSIS — Z7901 Long term (current) use of anticoagulants: Secondary | ICD-10-CM

## 2017-08-15 DIAGNOSIS — I82402 Acute embolism and thrombosis of unspecified deep veins of left lower extremity: Secondary | ICD-10-CM | POA: Diagnosis not present

## 2017-08-15 DIAGNOSIS — H401132 Primary open-angle glaucoma, bilateral, moderate stage: Secondary | ICD-10-CM | POA: Diagnosis not present

## 2017-08-15 DIAGNOSIS — Z8639 Personal history of other endocrine, nutritional and metabolic disease: Secondary | ICD-10-CM

## 2017-08-15 DIAGNOSIS — I480 Paroxysmal atrial fibrillation: Secondary | ICD-10-CM | POA: Diagnosis not present

## 2017-08-15 DIAGNOSIS — J984 Other disorders of lung: Secondary | ICD-10-CM

## 2017-08-15 DIAGNOSIS — I50812 Chronic right heart failure: Secondary | ICD-10-CM | POA: Diagnosis not present

## 2017-08-15 MED ORDER — WARFARIN SODIUM 2.5 MG PO TABS: 2.5000 mg | ORAL_TABLET | Freq: Every day | ORAL | 3 refills | Status: DC

## 2017-08-15 NOTE — Progress Notes (Signed)
CC: Fatigue  HPI:  Jacqueline Nguyen is a 84 y.o. female with PMHx detailed below presenting due to worsening fatigue for about the past 2 months.  She has not noticed any specific changes in lifestyle or behavior, although her son thinks she is more physically active during the day in this time period.  She is also now eating and entirely organic vegetarian diet.  She has not noticed any particular worsening of shortness of breath.  She feels she wakes up due to pain a few times per night.  She also requested medication refills for her Coumadin.  INR was 1.7 with dose discussed with Dr. Maudie Mercury today.  See problem based assessment and plan below for additional details.  Fatigue [name] has ongoing generalized fatigue that is likely multifactorial.  She reports inconsistent use of her CPAP although is also sleeping up in a chair rather than in bed.  Her exertional capacity during the day is limited by her respiratory disease and deconditioning.  She also notes fragmented sleep associated with her chronic pain. She has anxiety but denies any depressed mood or increased difficulty concentrating on overdrawing tasks.  She is also concerned about her nutritional status given history of previous vitamin deficiencies and following a fairly limited organic vegetarian diet.  Of note she had fairly extensive lab testing by her PCP Dr. Daryll Drown in fall of last year that was unrevealing for a metabolic explanation of her symptoms.  We will repeat a metabolic panel and blood count today to exclude anemia and check albumin and electrolytes as a surrogate marker of nutritional intake.  Will check ferritin today.  If labs are all unremarkable, her best benefit would likely be to focus on use of CPAP for better sleep quality as well as continuing to try increasing conditioning as tolerated with her heart failure and lung disease.   Past Medical History:  Diagnosis Date  . Arthritis   . Cataract   . Chronic  respiratory failure ([place])   . Endometriosis   . HTN (hypertension)   . Hx of echocardiogram    Echo (8/15):  Mild LVH, EF 65%, no RWMA, Ao sclerosis, no AS, mod MAC, mild LAE, normal RVSF, PASP 40 mmHg  . Hx pulmonary embolism    on chronic coumadin  . Obesities, morbid ([place])   . OSA (obstructive sleep apnea)   . Osteoporosis   . PAF (paroxysmal atrial fibrillation) ([place])    Event Monitor (8/15):  Frequent PACs, brief bursts of nonsustained ATach; no AFib  . Psoriasis   . Skin cancer   . Vertigo     Review of Systems: Review of Systems  Constitutional: Positive for malaise/fatigue. Negative for chills, fever and weight loss.  Respiratory: Positive for cough.   Cardiovascular: Negative for chest pain.  Genitourinary: Negative for frequency.  Musculoskeletal: Positive for back pain.  Psychiatric/Behavioral: The patient has insomnia.      Physical Exam: Vitals:   08/15/17 1034  BP: (!) 162/62  Pulse: 66  Temp: 97.7 F (36.5 C)  TempSrc: Oral  SpO2: 93%  Weight: 240 lb (108.9 kg)  Height: _0  (1.651 m)   GENERAL- alert, co-operative, obese woman in wheelchair HEENT- Atraumatic, PERRL, oral mucosa appears moist, no cervical LN enlargement.  CARDIAC- RRR, no murmurs, rubs or gallops. RESP- CTAB, no wheezes or crackles. NEURO- Strength upper and lower extremities- 5/5, Sensation intact globally EXTREMITIES- symmetric, trace bilateral pedal edema. SKIN- Warm, dry, No rash or lesion. PSYCH- Normal mood and  affect, appropriate thought content and speech.   Assessment & Plan:   See encounters tab for problem based medical decision making.   Patient discussed with Dr. Angelia Mould

## 2017-08-16 DIAGNOSIS — I10 Essential (primary) hypertension: Secondary | ICD-10-CM | POA: Diagnosis not present

## 2017-08-16 DIAGNOSIS — J9610 Chronic respiratory failure, unspecified whether with hypoxia or hypercapnia: Secondary | ICD-10-CM | POA: Diagnosis not present

## 2017-08-16 DIAGNOSIS — S31811D Laceration without foreign body of right buttock, subsequent encounter: Secondary | ICD-10-CM | POA: Diagnosis not present

## 2017-08-16 DIAGNOSIS — M1991 Primary osteoarthritis, unspecified site: Secondary | ICD-10-CM | POA: Diagnosis not present

## 2017-08-16 DIAGNOSIS — I480 Paroxysmal atrial fibrillation: Secondary | ICD-10-CM | POA: Diagnosis not present

## 2017-08-16 LAB — CMP14 + ANION GAP
ALT: 9 IU/L (ref 0–32)
AST: 14 IU/L (ref 0–40)
Albumin/Globulin Ratio: 1.6 (ref 1.2–2.2)
Albumin: 4 g/dL (ref 3.5–4.7)
Alkaline Phosphatase: 63 IU/L (ref 39–117)
Anion Gap: 15 mmol/L (ref 10.0–18.0)
BUN/Creatinine Ratio: 13 (ref 12–28)
BUN: 8 mg/dL (ref 8–27)
Bilirubin Total: <0.2 mg/dL (ref 0.0–1.2)
CO2: 27 mmol/L (ref 20–29)
Calcium: 9.3 mg/dL (ref 8.7–10.3)
Chloride: 101 mmol/L (ref 96–106)
Creatinine, Ser: 0.6 mg/dL (ref 0.57–1.00)
GFR calc Af Amer: 97 mL/min/1.73
GFR calc non Af Amer: 85 mL/min/1.73
Globulin, Total: 2.5 g/dL (ref 1.5–4.5)
Glucose: 91 mg/dL (ref 65–99)
Potassium: 4.5 mmol/L (ref 3.5–5.2)
Sodium: 143 mmol/L (ref 134–144)
Total Protein: 6.5 g/dL (ref 6.0–8.5)

## 2017-08-16 LAB — CBC
Hematocrit: 45.9 % (ref 34.0–46.6)
Hemoglobin: 14.3 g/dL (ref 11.1–15.9)
MCH: 30.4 pg (ref 26.6–33.0)
MCHC: 31.2 g/dL — ABNORMAL LOW (ref 31.5–35.7)
MCV: 98 fL — ABNORMAL HIGH (ref 79–97)
PLATELETS: 367 10*3/uL (ref 150–379)
RBC: 4.71 x10E6/uL (ref 3.77–5.28)
RDW: 13.3 % (ref 12.3–15.4)
WBC: 8.4 10*3/uL (ref 3.4–10.8)

## 2017-08-16 LAB — FERRITIN: FERRITIN: 45 ng/mL (ref 15–150)

## 2017-08-19 DIAGNOSIS — J449 Chronic obstructive pulmonary disease, unspecified: Secondary | ICD-10-CM | POA: Diagnosis not present

## 2017-08-19 NOTE — Assessment & Plan Note (Signed)
Ms. Figg has ongoing generalized fatigue that is likely multifactorial.  She reports inconsistent use of her CPAP although is also sleeping up in a chair rather than in bed.  Her exertional capacity during the day is limited by her respiratory disease and deconditioning.  She also notes fragmented sleep associated with her chronic pain. She has anxiety but denies any depressed mood or increased difficulty concentrating on overdrawing tasks.  She is also concerned about her nutritional status given history of previous vitamin deficiencies and following a fairly limited organic vegetarian diet.  Of note she had fairly extensive lab testing by her PCP Dr. Daryll Drown in fall of last year that was unrevealing for a metabolic explanation of her symptoms.  We will repeat a metabolic panel and blood count today to exclude anemia and check albumin and electrolytes as a surrogate marker of nutritional intake.  Will check ferritin today.  If labs are all unremarkable, her best benefit would likely be to focus on use of CPAP for better sleep quality as well as continuing to try increasing conditioning as tolerated with her heart failure and lung disease.

## 2017-08-20 ENCOUNTER — Telehealth: Payer: Self-pay | Admitting: Internal Medicine

## 2017-08-20 NOTE — Telephone Encounter (Signed)
PT seen on 08/15/2017 requesting her Lab results.

## 2017-08-20 NOTE — Progress Notes (Signed)
Internal Medicine Clinic Attending  Case discussed with Dr. Benjamine Mola at the time of the visit.  We reviewed the resident's history and exam and pertinent patient test results.  I agree with the assessment, diagnosis, and plan of care documented in the resident's note.

## 2017-08-21 ENCOUNTER — Telehealth (INDEPENDENT_AMBULATORY_CARE_PROVIDER_SITE_OTHER): Payer: Medicare Other | Admitting: Internal Medicine

## 2017-08-21 DIAGNOSIS — J449 Chronic obstructive pulmonary disease, unspecified: Secondary | ICD-10-CM

## 2017-08-21 DIAGNOSIS — Z86711 Personal history of pulmonary embolism: Secondary | ICD-10-CM

## 2017-08-21 DIAGNOSIS — D0430 Carcinoma in situ of skin of unspecified part of face: Secondary | ICD-10-CM | POA: Diagnosis not present

## 2017-08-21 DIAGNOSIS — Z86718 Personal history of other venous thrombosis and embolism: Secondary | ICD-10-CM | POA: Diagnosis not present

## 2017-08-21 DIAGNOSIS — C44519 Basal cell carcinoma of skin of other part of trunk: Secondary | ICD-10-CM | POA: Diagnosis not present

## 2017-08-21 DIAGNOSIS — C44612 Basal cell carcinoma of skin of right upper limb, including shoulder: Secondary | ICD-10-CM | POA: Diagnosis not present

## 2017-08-21 DIAGNOSIS — Z5181 Encounter for therapeutic drug level monitoring: Secondary | ICD-10-CM | POA: Diagnosis not present

## 2017-08-21 DIAGNOSIS — Z7901 Long term (current) use of anticoagulants: Secondary | ICD-10-CM

## 2017-08-21 DIAGNOSIS — J441 Chronic obstructive pulmonary disease with (acute) exacerbation: Secondary | ICD-10-CM

## 2017-08-21 DIAGNOSIS — H9190 Unspecified hearing loss, unspecified ear: Secondary | ICD-10-CM | POA: Diagnosis not present

## 2017-08-21 NOTE — Telephone Encounter (Signed)
Jacqueline Nguyen calls about an out of range INR 1.7 of 5/20

## 2017-08-21 NOTE — Telephone Encounter (Signed)
Noted, will let Dr Maudie Mercury address in AM.

## 2017-08-21 NOTE — Telephone Encounter (Signed)
Rec'c call from [facility] about test results.  Please back to [phone].  Representative "Rolofo" stated please call that number back and anyone will assist you with the results.

## 2017-08-22 ENCOUNTER — Telehealth: Payer: Self-pay | Admitting: Internal Medicine

## 2017-08-22 DIAGNOSIS — S31811D Laceration without foreign body of right buttock, subsequent encounter: Secondary | ICD-10-CM | POA: Diagnosis not present

## 2017-08-22 DIAGNOSIS — I10 Essential (primary) hypertension: Secondary | ICD-10-CM | POA: Diagnosis not present

## 2017-08-22 DIAGNOSIS — J9610 Chronic respiratory failure, unspecified whether with hypoxia or hypercapnia: Secondary | ICD-10-CM | POA: Diagnosis not present

## 2017-08-22 DIAGNOSIS — I480 Paroxysmal atrial fibrillation: Secondary | ICD-10-CM | POA: Diagnosis not present

## 2017-08-22 DIAGNOSIS — M1991 Primary osteoarthritis, unspecified site: Secondary | ICD-10-CM | POA: Diagnosis not present

## 2017-08-22 MED ORDER — ALBUTEROL SULFATE HFA 108 (90 BASE) MCG/ACT IN AERS: 1.0000 | INHALATION_SPRAY | Freq: Every day | RESPIRATORY_TRACT | 3 refills | Status: DC | PRN

## 2017-08-22 NOTE — Telephone Encounter (Signed)
Patient is calling regarding lab results

## 2017-08-22 NOTE — Telephone Encounter (Signed)
Thank you. Let us know what happens

## 2017-08-22 NOTE — Telephone Encounter (Signed)
I tried calling patient, but no answer, no voicemail option. We will continue to try calling patient throughout the day. Thank you!

## 2017-08-22 NOTE — Telephone Encounter (Signed)
Dr Maudie Mercury spoke w/ pt this am

## 2017-08-27 ENCOUNTER — Encounter: Payer: Self-pay | Admitting: Pharmacist

## 2017-08-27 DIAGNOSIS — Z86718 Personal history of other venous thrombosis and embolism: Secondary | ICD-10-CM

## 2017-08-27 DIAGNOSIS — Z7901 Long term (current) use of anticoagulants: Secondary | ICD-10-CM | POA: Diagnosis not present

## 2017-08-27 DIAGNOSIS — Z86711 Personal history of pulmonary embolism: Secondary | ICD-10-CM

## 2017-08-27 DIAGNOSIS — I480 Paroxysmal atrial fibrillation: Secondary | ICD-10-CM | POA: Diagnosis not present

## 2017-08-27 DIAGNOSIS — G4733 Obstructive sleep apnea (adult) (pediatric): Secondary | ICD-10-CM | POA: Diagnosis not present

## 2017-08-27 LAB — POCT INR: INR: 2.3 (ref 2.0–3.0)

## 2017-08-28 DIAGNOSIS — I480 Paroxysmal atrial fibrillation: Secondary | ICD-10-CM | POA: Diagnosis not present

## 2017-08-28 DIAGNOSIS — M1991 Primary osteoarthritis, unspecified site: Secondary | ICD-10-CM | POA: Diagnosis not present

## 2017-08-28 DIAGNOSIS — S31811D Laceration without foreign body of right buttock, subsequent encounter: Secondary | ICD-10-CM | POA: Diagnosis not present

## 2017-08-28 DIAGNOSIS — I10 Essential (primary) hypertension: Secondary | ICD-10-CM | POA: Diagnosis not present

## 2017-08-28 DIAGNOSIS — J9610 Chronic respiratory failure, unspecified whether with hypoxia or hypercapnia: Secondary | ICD-10-CM | POA: Diagnosis not present

## 2017-08-28 NOTE — Progress Notes (Signed)
Anticoagulation Management Jacqueline Nguyen a 84 y.o.femalewho is onwarfarintreatment. She monitors INR at home through [facility], results were faxed in.  Indication: DVT and PEhistory, paroxysmal atrial fibrillation Duration: indefinite Supervising physician:Nischal Narendra  Patient reports no signs or symptoms of bleeding or thromboembolism.  Anticoagulation Episode Summary    Current INR goal:   2.0-3.0  TTR:   68.3 % (1.1 y)  Next INR check:   09/03/2017  INR from last check:   2.3 (08/27/2017)  Weekly max warfarin dose:     Target end date:     INR check location:     Preferred lab:     Send INR reminders to:      Indications   History of DVT (deep vein thrombosis) [Z86.718] Anticoagulated on Coumadin [Z79.01] History of pulmonary embolism [Z86.711]       Comments:         Anticoagulation Care Providers    Provider Role Specialty Phone number   [name], MD Responsible Internal Medicine [phone]     Allergies  Allergen Reactions  . Ciprofloxacin   . Bactrim Rash  . Codeine Nausea Only   Medication Sig  acetic acid-hydrocortisone (VOSOL-HC) otic solution Place 3 drops into the right ear 3 (three) times daily. For seven days.  albuterol (PROVENTIL HFA;VENTOLIN HFA) 108 (90 Base) MCG/ACT inhaler Inhale 1-2 puffs into the lungs daily as needed for shortness of breath or wheezing.  Ascorbic Acid (VITAMIN C PO) Take 1 capsule by mouth daily.  atenolol (TENORMIN) 25 MG tablet Take 1 tablet (25 mg total) by mouth 4 (four) times daily as needed. MAY TAKE 1 TABLET 4 TIMES DAILY AS NEEDED.  AZOPT 1 % ophthalmic suspension Place 1 drop into both eyes 2 (two) times daily.  budesonide-formoterol (SYMBICORT) 160-4.5 MCG/ACT inhaler Inhale 2 puffs into the lungs every 12 (twelve) hours.  Cholecalciferol (VITAMIN D PO) Take 1 capsule by mouth daily.  clotrimazole (LOTRIMIN) 1 % cream Apply 1 application 2 (two) times daily topically. Approximately 1 gram to the affected  area.  COMBIGAN 0.2-0.5 % ophthalmic solution Place 1 drop into both eyes 2 (two) times daily.  cyanocobalamin 1000 MCG tablet Take 1,000 mcg by mouth daily.  diltiazem (CARDIZEM CD) 120 MG 24 hr capsule TAKE ONE (1) CAPSULE BY MOUTH 2 TIMES DAILY  FLOVENT HFA 44 MCG/ACT inhaler INHALE 1 PUFF TWICE A DAY DAILY  furosemide (LASIX) 20 MG tablet TAKE 1 TABLET (20 MG TOTAL) BY MOUTH DAILY.  HYDROcodone-acetaminophen (NORCO) 10-325 MG tablet Take 1 tablet by mouth every 6 (six) hours as needed for severe pain.  levalbuterol (XOPENEX) 0.63 MG/3ML nebulizer solution Take 3 mLs (0.63 mg total) by nebulization every 8 (eight) hours as needed for wheezing.  meclizine (ANTIVERT) 25 MG tablet Take 1 tablet (25 mg total) by mouth 2 (two) times daily as needed for dizziness. TAKE ONE TABLET 2-3 TIMES A DAY AS NEEDED FOR VERTIGO  Multiple Vitamin (MULTIVITAMIN WITH MINERALS) TABS tablet Take 1 tablet by mouth daily.  Nystatin ([place]age) 100000 UNIT/GM POWD Apply topically.  Respiratory Therapy Supplies (FLUTTER) DEVI Blow through 4 times per set, three sets daily  Turmeric (RA TURMERIC) 500 MG CAPS Take 500 mg by mouth at bedtime.  warfarin (COUMADIN) 2.5 MG tablet Take 1 tablet (2.5 mg total) by mouth daily. exept take 1.5 tablet (3.75 mg) on Mondays   Past Medical History:  Diagnosis Date  . Arthritis   . Cataract   . Chronic respiratory failure ([place])   . Endometriosis   .  HTN (hypertension)   . Hx of echocardiogram    Echo (8/15):  Mild LVH, EF 65%, no RWMA, Ao sclerosis, no AS, mod MAC, mild LAE, normal RVSF, PASP 40 mmHg  . Hx pulmonary embolism    on chronic coumadin  . Obesities, morbid ([place])   . OSA (obstructive sleep apnea)   . Osteoporosis   . PAF (paroxysmal atrial fibrillation) ([place])    Event Monitor (8/15):  Frequent PACs, brief bursts of nonsustained ATach; no AFib  . Psoriasis   . Skin cancer   . Vertigo    Social History   Socioeconomic History  . Marital status: Widowed     Spouse name: Not on file  . Number of children: Not on file  . Years of education: Not on file  . Highest education level: Not on file  Occupational History  . Not on file  Social Needs  . Financial resource strain: Not on file  . Food insecurity:    Worry: Not on file    Inability: Not on file  . Transportation needs:    Medical: Not on file    Non-medical: Not on file  Tobacco Use  . Smoking status: Former Smoker    Packs/day: 1.00    Years: 2.00    Pack years: 2.00    Types: Cigarettes    Last attempt to quit: 04/03/1981    Years since quitting: 36.4  . Smokeless tobacco: Never Used  Substance and Sexual Activity  . Alcohol use: No  . Drug use: No  . Sexual activity: Not Currently  Lifestyle  . Physical activity:    Days per week: Not on file    Minutes per session: Not on file  . Stress: Not on file  Relationships  . Social connections:    Talks on phone: Not on file    Gets together: Not on file    Attends religious service: Not on file    Active member of club or organization: Not on file    Attends meetings of clubs or organizations: Not on file    Relationship status: Not on file  Other Topics Concern  . Not on file  Social History Narrative   She lives with her sister in [place] and requires assistance at home. Has an aid who visits and helps at home often. Son works as a Catering manager and also visits and helps when he can. Semi-independent in ADLs, requires walker around home, dependent in IADLs.   Family History  Problem Relation Age of Onset  . Heart attack Father   . Heart attack Brother   . Stroke Neg Hx    ASSESSMENT Recent Results: Lab Results  Component Value Date   INR 2.3 08/27/2017   INR 1.7 08/13/2017   INR 1.9 08/07/2017   Anticoagulation Dosing: 2.5 mg daily, except 5 mg Mondays and Wendesdays   INR today: Therapeutic  PLAN Weekly dose was unchanged   There are no Patient Instructions on file for this visit. Patient advised  to contact clinic or seek medical attention if signs/symptoms of bleeding or thromboembolism occur.  Patient verbalized understanding by repeating back information and was advised to contact me if further medication-related questions arise. Patient was also provided an information handout.  Follow-up No follow-ups on file.  [name]

## 2017-09-03 ENCOUNTER — Encounter: Payer: Self-pay | Admitting: Pharmacist

## 2017-09-03 ENCOUNTER — Telehealth: Payer: Self-pay | Admitting: Internal Medicine

## 2017-09-03 DIAGNOSIS — Z86711 Personal history of pulmonary embolism: Secondary | ICD-10-CM

## 2017-09-03 DIAGNOSIS — Z7901 Long term (current) use of anticoagulants: Secondary | ICD-10-CM

## 2017-09-03 DIAGNOSIS — Z86718 Personal history of other venous thrombosis and embolism: Secondary | ICD-10-CM

## 2017-09-03 LAB — PROTIME-INR

## 2017-09-03 LAB — POCT INR: INR: 2.2 (ref 2.0–3.0)

## 2017-09-03 NOTE — Telephone Encounter (Signed)
Pt INR 2.2

## 2017-09-03 NOTE — Telephone Encounter (Signed)
Patient is requesting Bonnita Nasuti to call

## 2017-09-03 NOTE — Telephone Encounter (Signed)
rtc to pt, she just wanted to talk

## 2017-09-04 ENCOUNTER — Telehealth: Payer: Self-pay | Admitting: *Deleted

## 2017-09-04 DIAGNOSIS — G4733 Obstructive sleep apnea (adult) (pediatric): Secondary | ICD-10-CM | POA: Diagnosis not present

## 2017-09-04 DIAGNOSIS — S31811D Laceration without foreign body of right buttock, subsequent encounter: Secondary | ICD-10-CM | POA: Diagnosis not present

## 2017-09-04 DIAGNOSIS — I10 Essential (primary) hypertension: Secondary | ICD-10-CM | POA: Diagnosis not present

## 2017-09-04 DIAGNOSIS — M1991 Primary osteoarthritis, unspecified site: Secondary | ICD-10-CM | POA: Diagnosis not present

## 2017-09-04 DIAGNOSIS — J9610 Chronic respiratory failure, unspecified whether with hypoxia or hypercapnia: Secondary | ICD-10-CM | POA: Diagnosis not present

## 2017-09-04 DIAGNOSIS — I480 Paroxysmal atrial fibrillation: Secondary | ICD-10-CM | POA: Diagnosis not present

## 2017-09-04 MED ORDER — NYSTATIN 100000 UNIT/GM EX POWD: Freq: Four times a day (QID) | CUTANEOUS | 0 refills | Status: DC

## 2017-09-04 NOTE — Telephone Encounter (Signed)
HHN, hayley calls and states the rash that pt had under breasts has now started in fold of belly, she states this is "yeasty" in appearance and thinks pt needs something more than the lotrimin cream that she has been using. Please advise

## 2017-09-04 NOTE — Telephone Encounter (Signed)
Spoke with Bonnita Nasuti.  Will refill nystatin powder.  Nursing to call if not improving.

## 2017-09-07 LAB — PROTIME-INR

## 2017-09-08 ENCOUNTER — Other Ambulatory Visit: Payer: Self-pay | Admitting: Internal Medicine

## 2017-09-10 ENCOUNTER — Other Ambulatory Visit: Payer: Self-pay | Admitting: Internal Medicine

## 2017-09-10 DIAGNOSIS — G4733 Obstructive sleep apnea (adult) (pediatric): Secondary | ICD-10-CM | POA: Diagnosis not present

## 2017-09-11 ENCOUNTER — Encounter: Payer: Self-pay | Admitting: Pharmacist

## 2017-09-11 DIAGNOSIS — Z7901 Long term (current) use of anticoagulants: Secondary | ICD-10-CM

## 2017-09-11 DIAGNOSIS — Z86711 Personal history of pulmonary embolism: Secondary | ICD-10-CM

## 2017-09-11 DIAGNOSIS — H903 Sensorineural hearing loss, bilateral: Secondary | ICD-10-CM | POA: Diagnosis not present

## 2017-09-11 DIAGNOSIS — Z86718 Personal history of other venous thrombosis and embolism: Secondary | ICD-10-CM

## 2017-09-11 LAB — POCT INR: INR: 2.6 (ref 2.0–3.0)

## 2017-09-11 NOTE — Progress Notes (Signed)
INR therapeutic, no changes to warfarin dose required. Tried calling patient but unable to reach.  

## 2017-09-17 ENCOUNTER — Encounter: Payer: Self-pay | Admitting: Pharmacist

## 2017-09-17 DIAGNOSIS — Z7901 Long term (current) use of anticoagulants: Secondary | ICD-10-CM

## 2017-09-17 DIAGNOSIS — Z86718 Personal history of other venous thrombosis and embolism: Secondary | ICD-10-CM

## 2017-09-17 DIAGNOSIS — Z86711 Personal history of pulmonary embolism: Secondary | ICD-10-CM

## 2017-09-17 LAB — POCT INR: INR: 2.5 (ref 2.0–3.0)

## 2017-09-18 LAB — PROTIME-INR

## 2017-09-18 NOTE — Progress Notes (Signed)
INR therapeutic, no changes to warfarin dose required. Tried calling patient but unable to reach.

## 2017-09-19 ENCOUNTER — Telehealth: Payer: Self-pay | Admitting: *Deleted

## 2017-09-19 DIAGNOSIS — J449 Chronic obstructive pulmonary disease, unspecified: Secondary | ICD-10-CM | POA: Diagnosis not present

## 2017-09-19 NOTE — Telephone Encounter (Addendum)
Received fax from [facility] PT/INR [facility] Testing [facility] with results of patient's PT/INR from 05/07/2017 to 09/17/2017. Patient being followed by Dr. Maudie Mercury. Given to Dr. Maudie Mercury. Hubbard Hartshorn, RN, BSN

## 2017-09-20 LAB — PROTIME-INR

## 2017-09-24 ENCOUNTER — Other Ambulatory Visit: Payer: Self-pay | Admitting: Internal Medicine

## 2017-09-24 DIAGNOSIS — Z7901 Long term (current) use of anticoagulants: Secondary | ICD-10-CM | POA: Diagnosis not present

## 2017-09-24 DIAGNOSIS — I480 Paroxysmal atrial fibrillation: Secondary | ICD-10-CM | POA: Diagnosis not present

## 2017-09-27 DIAGNOSIS — G4733 Obstructive sleep apnea (adult) (pediatric): Secondary | ICD-10-CM | POA: Diagnosis not present

## 2017-09-28 LAB — PROTIME-INR: INR: 2.5 — AB (ref 0.9–1.1)

## 2017-10-01 LAB — POCT INR: INR: 1.8 — AB (ref 2.0–3.0)

## 2017-10-02 ENCOUNTER — Telehealth: Payer: Self-pay | Admitting: *Deleted

## 2017-10-02 ENCOUNTER — Encounter: Payer: Self-pay | Admitting: Pharmacist

## 2017-10-02 DIAGNOSIS — Z86711 Personal history of pulmonary embolism: Secondary | ICD-10-CM

## 2017-10-02 DIAGNOSIS — Z86718 Personal history of other venous thrombosis and embolism: Secondary | ICD-10-CM

## 2017-10-02 DIAGNOSIS — Z7901 Long term (current) use of anticoagulants: Secondary | ICD-10-CM

## 2017-10-02 NOTE — Progress Notes (Signed)
Anticoagulation Management Jacqueline Nguyen a 84 y.o.femalewho is onwarfarintreatment. She monitors INR at home through [facility], results were faxed in.  Indication: DVT and PEhistory, paroxysmal atrial fibrillation Duration: indefinite Supervising physician:Emily Mullen  Patient reports no signs or symptoms of bleeding or thromboembolism.  Anticoagulation Episode Summary    Current INR goal:   2.0-3.0  TTR:   70.3 % (1.2 y)  Next INR check:   10/08/2017  INR from last check:   1.8! (10/01/2017)  Weekly max warfarin dose:     Target end date:     INR check location:     Preferred lab:     Send INR reminders to:      Indications   History of DVT (deep vein thrombosis) [Z86.718] Anticoagulated on Coumadin [Z79.01] History of pulmonary embolism [Z86.711]       Comments:         Anticoagulation Care Providers    Provider Role Specialty Phone number   [name], MD Responsible Internal Medicine [phone]     Allergies  Allergen Reactions  . Ciprofloxacin   . Bactrim Rash  . Codeine Nausea Only   Medication Sig  acetic acid-hydrocortisone (VOSOL-HC) otic solution Place 3 drops into the right ear 3 (three) times daily. For seven days.  albuterol (PROVENTIL HFA;VENTOLIN HFA) 108 (90 Base) MCG/ACT inhaler Inhale 1-2 puffs into the lungs daily as needed for shortness of breath or wheezing.  Ascorbic Acid (VITAMIN C PO) Take 1 capsule by mouth daily.  atenolol (TENORMIN) 25 MG tablet Take 1 tablet (25 mg total) by mouth 4 (four) times daily as needed. MAY TAKE 1 TABLET 4 TIMES DAILY AS NEEDED.  AZOPT 1 % ophthalmic suspension Place 1 drop into both eyes 2 (two) times daily.  budesonide-formoterol (SYMBICORT) 160-4.5 MCG/ACT inhaler Inhale 2 puffs into the lungs every 12 (twelve) hours.  Cholecalciferol (VITAMIN D PO) Take 1 capsule by mouth daily.  clotrimazole (LOTRIMIN) 1 % cream Apply 1 application 2 (two) times daily topically. Approximately 1 gram to the affected  area.  COMBIGAN 0.2-0.5 % ophthalmic solution Place 1 drop into both eyes 2 (two) times daily.  cyanocobalamin 1000 MCG tablet Take 1,000 mcg by mouth daily.  diltiazem (CARDIZEM CD) 120 MG 24 hr capsule TAKE ONE (1) CAPSULE BY MOUTH 2 TIMES DAILY  FLOVENT HFA 44 MCG/ACT inhaler INHALE 1 PUFF TWICE A DAY DAILY  furosemide (LASIX) 20 MG tablet TAKE 1 TABLET (20 MG TOTAL) BY MOUTH DAILY.  HYDROcodone-acetaminophen (NORCO) 10-325 MG tablet Take 1 tablet by mouth every 6 (six) hours as needed for severe pain.  levalbuterol (XOPENEX) 0.63 MG/3ML nebulizer solution Take 3 mLs (0.63 mg total) by nebulization every 8 (eight) hours as needed for wheezing.  meclizine (ANTIVERT) 25 MG tablet Take 1 tablet (25 mg total) by mouth 2 (two) times daily as needed for dizziness. TAKE ONE TABLET 2-3 TIMES A DAY AS NEEDED FOR VERTIGO  Multiple Vitamin (MULTIVITAMIN WITH MINERALS) TABS tablet Take 1 tablet by mouth daily.  NYAMYC powder APPLY TOPICALLY FOUR TIMES DAILY  Respiratory Therapy Supplies (FLUTTER) DEVI Blow through 4 times per set, three sets daily  Turmeric (RA TURMERIC) 500 MG CAPS Take 500 mg by mouth at bedtime.  warfarin (COUMADIN) 2.5 MG tablet Take 1 tablet (2.5 mg total) by mouth daily. exept take 1.5 tablet (3.75 mg) on Mondays   Past Medical History:  Diagnosis Date  . Arthritis   . Cataract   . Chronic respiratory failure ([place])   . Endometriosis   .  HTN (hypertension)   . Hx of echocardiogram    Echo (8/15):  Mild LVH, EF 65%, no RWMA, Ao sclerosis, no AS, mod MAC, mild LAE, normal RVSF, PASP 40 mmHg  . Hx pulmonary embolism    on chronic coumadin  . Obesities, morbid ([place])   . OSA (obstructive sleep apnea)   . Osteoporosis   . PAF (paroxysmal atrial fibrillation) ([place])    Event Monitor (8/15):  Frequent PACs, brief bursts of nonsustained ATach; no AFib  . Psoriasis   . Skin cancer   . Vertigo    Social History   Socioeconomic History  . Marital status: Widowed    Spouse name:  Not on file  . Number of children: Not on file  . Years of education: Not on file  . Highest education level: Not on file  Occupational History  . Not on file  Social Needs  . Financial resource strain: Not on file  . Food insecurity:    Worry: Not on file    Inability: Not on file  . Transportation needs:    Medical: Not on file    Non-medical: Not on file  Tobacco Use  . Smoking status: Former Smoker    Packs/day: 1.00    Years: 2.00    Pack years: 2.00    Types: Cigarettes    Last attempt to quit: 04/03/1981    Years since quitting: 36.5  . Smokeless tobacco: Never Used  Substance and Sexual Activity  . Alcohol use: No  . Drug use: No  . Sexual activity: Not Currently  Lifestyle  . Physical activity:    Days per week: Not on file    Minutes per session: Not on file  . Stress: Not on file  Relationships  . Social connections:    Talks on phone: Not on file    Gets together: Not on file    Attends religious service: Not on file    Active member of club or organization: Not on file    Attends meetings of clubs or organizations: Not on file    Relationship status: Not on file  Other Topics Concern  . Not on file  Social History Narrative   She lives with her sister in [place] and requires assistance at home. Has an aid who visits and helps at home often. Son works as a Catering manager and also visits and helps when he can. Semi-independent in ADLs, requires walker around home, dependent in IADLs.   Family History  Problem Relation Age of Onset  . Heart attack Father   . Heart attack Brother   . Stroke Neg Hx    ASSESSMENT Recent Results: Lab Results  Component Value Date   INR 1.8 (A) 10/01/2017   INR 2.5 (A) 09/24/2017   INR 2.5 09/17/2017   Anticoagulation Dosing: 2.5 mg daily (patient has been taking differently)   INR today: Subtherapeutic  PLAN Weekly dose was increased to 2.5 mg daily, except 3.75 mg (1.5 tablet) on Mondays  There are no  Patient Instructions on file for this visit. Patient advised to contact clinic or seek medical attention if signs/symptoms of bleeding or thromboembolism occur.  Patient verbalized understanding by repeating back information and was advised to contact me if further medication-related questions arise. Patient was also provided an information handout.  Follow-up 1 week  [name]

## 2017-10-02 NOTE — Telephone Encounter (Signed)
Call from Legrand Como, [facility] to reprot INR done yesterday 7/1.  INR is 1.8. Sent to Mannie Stabile who is following pt.

## 2017-10-02 NOTE — Telephone Encounter (Signed)
Jacqueline Nguyen made aware of INR.

## 2017-10-03 LAB — PROTIME-INR

## 2017-10-03 NOTE — Progress Notes (Signed)
I reviewed Dr. Julianne Rice note.  Patient is on coumadin for VTE.  INR low and dose increased and discussed with patient.

## 2017-10-03 NOTE — Telephone Encounter (Signed)
See Mannie Stabile 's phone encounter.

## 2017-10-04 DIAGNOSIS — G4733 Obstructive sleep apnea (adult) (pediatric): Secondary | ICD-10-CM | POA: Diagnosis not present

## 2017-10-07 ENCOUNTER — Other Ambulatory Visit: Payer: Self-pay | Admitting: Internal Medicine

## 2017-10-08 LAB — POCT INR: INR: 1.9 — AB (ref 2.0–3.0)

## 2017-10-09 ENCOUNTER — Telehealth: Payer: Self-pay | Admitting: *Deleted

## 2017-10-09 NOTE — Telephone Encounter (Signed)
Michael with [facility] called to report out of range INR taken last evening. Result 1.9. States he has also faxed result. Hubbard Hartshorn, RN, BSN

## 2017-10-10 ENCOUNTER — Other Ambulatory Visit: Payer: Self-pay | Admitting: Internal Medicine

## 2017-10-10 ENCOUNTER — Ambulatory Visit (HOSPITAL_COMMUNITY)
Admission: RE | Admit: 2017-10-10 | Discharge: 2017-10-10 | Disposition: A | Discharge status: Home / Self Care | Payer: Medicare Other | Source: Ambulatory Visit | Attending: Internal Medicine | Admitting: Internal Medicine

## 2017-10-10 ENCOUNTER — Other Ambulatory Visit: Payer: Self-pay

## 2017-10-10 ENCOUNTER — Encounter: Payer: Self-pay | Admitting: Internal Medicine

## 2017-10-10 ENCOUNTER — Ambulatory Visit (INDEPENDENT_AMBULATORY_CARE_PROVIDER_SITE_OTHER): Payer: Medicare Other | Admitting: Internal Medicine

## 2017-10-10 ENCOUNTER — Ambulatory Visit: Payer: Medicare Other | Admitting: Internal Medicine

## 2017-10-10 VITALS — BP 154/56 | HR 64 | Temp 98.0°F | Wt 260.1 lb

## 2017-10-10 DIAGNOSIS — I272 Pulmonary hypertension, unspecified: Secondary | ICD-10-CM | POA: Diagnosis not present

## 2017-10-10 DIAGNOSIS — G4733 Obstructive sleep apnea (adult) (pediatric): Secondary | ICD-10-CM | POA: Diagnosis not present

## 2017-10-10 DIAGNOSIS — I50812 Chronic right heart failure: Secondary | ICD-10-CM | POA: Diagnosis not present

## 2017-10-10 DIAGNOSIS — I480 Paroxysmal atrial fibrillation: Secondary | ICD-10-CM

## 2017-10-10 DIAGNOSIS — J9611 Chronic respiratory failure with hypoxia: Secondary | ICD-10-CM

## 2017-10-10 DIAGNOSIS — R5381 Other malaise: Secondary | ICD-10-CM

## 2017-10-10 DIAGNOSIS — J449 Chronic obstructive pulmonary disease, unspecified: Secondary | ICD-10-CM

## 2017-10-10 DIAGNOSIS — I10 Essential (primary) hypertension: Secondary | ICD-10-CM

## 2017-10-10 DIAGNOSIS — R599 Enlarged lymph nodes, unspecified: Secondary | ICD-10-CM

## 2017-10-10 DIAGNOSIS — R918 Other nonspecific abnormal finding of lung field: Secondary | ICD-10-CM

## 2017-10-10 DIAGNOSIS — H60501 Unspecified acute noninfective otitis externa, right ear: Secondary | ICD-10-CM

## 2017-10-10 MED ORDER — HYDROCORTISONE-ACETIC ACID 1-2 % OT SOLN: 3.0000 [drp] | Freq: Four times a day (QID) | OTIC | 0 refills | Status: AC

## 2017-10-10 NOTE — Progress Notes (Signed)
Subjective:    Patient ID: Jacqueline Nguyen, female    DOB: 1932/02/27, 84 y.o.   MRN: [id number]  2 month follow up for HTN and fatigue  HPI  Jacqueline Nguyen is an 84yo woman with PMH of COPD, pHTN on CPAP, HTN, obesity, arthritis with chronic pain, h/o DVT and PAF on coumadin who presents for follow up.   Jacqueline Nguyen reports that since I last saw her she has been having right ear pain.  She notes this has been going on about a month.  Initially was itching in nature, she used a Q tip for the itching and since that time has felt like something was stuck in there.  It is a continuous and tight feeling.  The itching has resolved.  She has not had any drainage of pus or blood.  She has no hearing changes, but does have hearing loss at baseline.   She also notes swollen feet bilaterally, worse on the right.  She notes that she has stopped taking her lasix regularly, specifically the last 3 days.  She notes that it is hard for her to get to the bathroom on time when she is on the lasix.  Her swelling is not associated with skin changes, erythema, or pain.  She is otherwise taking her medications as prescribed.   She has had 2 episodes of racing heart immediately upon awakening over the last few months.  EMS was called at the time and when they did an EKG, she was in normal sinus. We did an EKG here and she was also in NSR with a HR of 61.  It is possible that she is having paroxysms of Afib which are causing her heart to race, possibly due to medication wear off in the mornings.  She is a patient of Dr. Recardo Evangelist, but has been advised to get a Cardiologist in [place].  She is due to see her new Cardiologist on July 18, Dr. Ala Dach.  I advised them to discuss the heart racing and also the dental issues.    She further has had 2 molars with severe cavities, for which she has seen Dr. Marge Duncans in [place] in [place], [place].  She has been told that she needs the teeth removed due to risk for jaw infections and bacteremia.   She has h/o chronic respiratory failure, on oxygen and CPAP, she has history of right sided CHF, pAF and PE on warfarin.  I think a surgical removal of the teeth would be higher risk, and I discussed this with the patient and her son.    She continues to complain of some fatigue, we have worked this up in the past.  I think this is mainly deconditioning related to her chronic medical issues.  We briefly discussed cardiac/pulmonary rehab which she seemed interested in.    Review of Systems  Constitutional: Positive for activity change and fatigue. Negative for appetite change and fever.  Respiratory: Positive for shortness of breath (chronic). Negative for cough.   Cardiovascular: Positive for palpitations and leg swelling. Negative for chest pain.  Genitourinary: Positive for decreased urine volume, frequency and urgency. Negative for difficulty urinating and enuresis.  Musculoskeletal: Positive for arthralgias.  Neurological: Positive for dizziness. Negative for tremors, weakness and light-headedness.       Objective:   Physical Exam  Constitutional: She is oriented to person, place, and time.  Elderly woman, sitting in powered chair, appears fatigued.   HENT:  Head: Normocephalic and atraumatic.  Right Ear: Tympanic membrane normal. No lacerations. There is tenderness. No drainage or swelling. No foreign bodies. No mastoid tenderness. Tympanic membrane is not injected and not retracted. No middle ear effusion. Decreased hearing is noted.  Left Ear: Tympanic membrane and ear canal normal. No lacerations. No drainage, swelling or tenderness. No foreign bodies. No mastoid tenderness. Tympanic membrane is not injected and not retracted.  No middle ear effusion. Decreased hearing is noted.  She has some mild erythema of the external canal on the right with minimal ear wax.   Eyes: Conjunctivae are normal. Right eye exhibits no discharge. Left eye exhibits no discharge.  Cardiovascular: Normal  rate, regular rhythm and intact distal pulses.  Pulmonary/Chest: Effort normal. No respiratory distress.  Musculoskeletal: She exhibits edema (to ankles, pitting at top of feet, no erythema) and tenderness. She exhibits no deformity.  Lymphadenopathy:    She has no cervical adenopathy.  Neurological: She is alert and oriented to person, place, and time.  Skin: Skin is warm and dry.  Psychiatric: She has a normal mood and affect. Her behavior is normal.  Nursing note and vitals reviewed.    EKG today showed NSR      Assessment & Plan:  Return in 3 months, sooner if needed.  I advised her to follow up with Cardiology and dental and to discuss the possibility of dental surgery with Cardiology. I think she would at least be moderate cardiac risk, possibly high.   Dental Caries She reports to me that she has a dentist who believes she needs two molars pulled.  I think she would be at least moderate risk given her cardiac diseases and mod-high risk given her pulmonary diseases.  I discussed this with her son.  They have an appointment with a new Cardiologist this month and I advised they discuss with this physician further prior to undergoing surgery.    Otitis Externa, acute, mild Ear drops with acetic acid-hydrocortisone, QID for 7 days.

## 2017-10-10 NOTE — Patient Instructions (Signed)
Jacqueline Nguyen - -  I think you have otitis externa (not affecting ear drum).  Please try ear drops for 7 days, 4 times a day. More information below.   Please come back to see me in about 3 months.    Thank you!  Acetic Acid; Hydrocortisone Ear Solution What is this medicine? ACETIC ACID; HYDROCORTISONE (a SEE tik AS id; hye droe KOR ti sone) is used to treat outer ear infections. This medicine may be used for other purposes; ask your health care provider or pharmacist if you have questions. COMMON BRAND NAME(S): Acetasol HC, Otomycet-HC, VoSoL HC What should I tell my health care provider before I take this medicine? They need to know if you have any of these conditions: -herpes infection -ruptured ear drum -vaccinia (the skin blister that occurs after a smallpox vaccine) -an unusual or allergic reaction to acetic acid, hydrocortisone, propylene glycol, other medicines, foods, dyes, or preservatives -pregnant or trying to get pregnant -breast-feeding How should I use this medicine? This medicine is only for use in your ears. Follow the directions on the prescription label. Wash your hands with soap and water. First, carefully clean your ear(s) with a dry cotton swab. Gently warm the bottle by holding it in the hand for 1 to 2 minutes. Use the ear solution as directed by your doctor or health care professional. Shanda Howells down on your side with the affected ear up. Try not to touch the tip of the dropper to your ear, fingertips, or other surface. Squeeze the bottle gently to put the prescribed number of drops in the ear canal. Stay in this position for 30 to 60 seconds to help the drops soak into the ear. Repeat, if necessary, for the opposite ear. Do not use your medicine more often than directed. Finish the full course of medicine prescribed by your health care professional even if you think your condition is better. Talk to your pediatrician regarding the use of this medicine in children. While this  drug may be prescribed for children as young as 88 years of age and older for selected conditions, precautions do apply. Overdosage: If you think you have taken too much of this medicine contact a poison control center or emergency room at once. NOTE: This medicine is only for you. Do not share this medicine with others. What if I miss a dose? If you miss a dose, use it as soon as you can. If it is almost time for your next dose, use only that dose. Do not use double or extra doses. What may interact with this medicine? Interactions are not expected. Do not use other ear products without talking to your doctor or health care professional. This list may not describe all possible interactions. Give your health care provider a list of all the medicines, herbs, non-prescription drugs, or dietary supplements you use. Also tell them if you smoke, drink alcohol, or use illegal drugs. Some items may interact with your medicine. What should I watch for while using this medicine? Tell your doctor or health care professional if your ear infection does not get better in a few days. If a rash or allergic reaction occurs, stop using this product right away and contact your doctor or health care professional. It is important that you keep the infected ear(s) clean and dry. When bathing, try not to get the infected ear(s) wet. Do not go swimming unless your doctor or health care professional has told you otherwise. To prevent the spread of  infection, do not share ear products or share towels and washcloths with anyone else. What side effects may I notice from receiving this medicine? Side effects that you should report to your doctor or health care professional as soon as possible: -allergic reactions like skin rash, itching or hives, swelling of the face, lips, or tongue -burning and redness -worsening ear pain Side effects that usually do not require medical attention (report to your doctor or health care  professional if they continue or are bothersome): -unpleasant feeling while putting the drops in the ear This list may not describe all possible side effects. Call your doctor for medical advice about side effects. You may report side effects to FDA at 1-800-FDA-1088. Where should I keep my medicine? Keep out of the reach of children. Store at room temperature between 15 and 30 degrees C (59 and 86 degrees F). Do not freeze. Protect from light. Throw away any unused medicine after the expiration date. NOTE: This sheet is a summary. It may not cover all possible information. If you have questions about this medicine, talk to your doctor, pharmacist, or health care provider.  2018 Elsevier/Gold Standard (2007-06-13 10:56:41)

## 2017-10-10 NOTE — Telephone Encounter (Signed)
Reviewed and saw Patient today.

## 2017-10-11 ENCOUNTER — Encounter: Payer: Self-pay | Admitting: Pharmacist

## 2017-10-11 ENCOUNTER — Encounter: Payer: Self-pay | Admitting: Internal Medicine

## 2017-10-11 DIAGNOSIS — Z7901 Long term (current) use of anticoagulants: Secondary | ICD-10-CM

## 2017-10-11 DIAGNOSIS — Z86718 Personal history of other venous thrombosis and embolism: Secondary | ICD-10-CM

## 2017-10-11 DIAGNOSIS — Z86711 Personal history of pulmonary embolism: Secondary | ICD-10-CM

## 2017-10-11 NOTE — Assessment & Plan Note (Signed)
She is doing well today, no wheezing on exam.   Plan Continue Xopenex PRN, symbicort BID

## 2017-10-11 NOTE — Assessment & Plan Note (Signed)
Jacqueline Nguyen reports increased swelling in her LE, but she has also been forgoing her lasix.  I advised her to go back on daily lasix, weight monitoring, etc.  She has [place] and caretakers who are with her during the day.  I advised timed toileting during the time she is taking her lasix to avoid accidents.   Plan Restart daily lasix at 55m daily Continue diltiazem, atenolol

## 2017-10-11 NOTE — Assessment & Plan Note (Signed)
Advised that she consider cardiopulmonary rehab to be set up in [facility] area

## 2017-10-11 NOTE — Assessment & Plan Note (Signed)
She has chronic respiratory failure related to this issue.  She is on oxygen and CPAP.  She has chronic fatigue for which we have not been able to find an obvious cause.  I think this is likely related to her chronic medical issues, deconditioning.  I think she would benefit from cardiopulmonary rehab.  I advised she speak to her new Cardiologist about this.

## 2017-10-11 NOTE — Assessment & Plan Note (Signed)
She is wearing her oxygen today.  Has deconditioning related to this issue and sedentary lifestyle.   Advised that they consider cardiopulmonary rehab.  She lives in the [place] area so I advised her son to discuss with her Cardiologist in that area to set up. She would not be able to come to [place] daily for rehab.

## 2017-10-11 NOTE — Assessment & Plan Note (Signed)
She has a resting elevated systolic, but low diastolic.  Her cardiologist has advised allowing a higher systolic in order to not drive her diastolic too low, which I am following.    Continue to monitor closely  Continue atenolol, diltiazem, lasix.

## 2017-10-11 NOTE — Assessment & Plan Note (Signed)
She has not followed up with her repeat CT scan.  I re-emphasized the importance of this scan today and recommended that she follow up.

## 2017-10-11 NOTE — Assessment & Plan Note (Signed)
She has been having episodes of tachycardia when she awakens.  She reports a normal EKG when EMS arrived on 2 occasions and again today her EKG shows NSR.  I think she may be having intermittent RVR related to her chronic condition.  She is on coumadin and we monitor her INR at home with her.  She is occasionally subtherapeutic.  She is also on diltiazem and atenolol.  Her resting heart rate was in the 60s today, so I will not change her medications.   Plan Continue diltiazem and atenolol at current doses EKG reviewed today Continue coumadin with monitoring through our clinic.

## 2017-10-11 NOTE — Assessment & Plan Note (Signed)
This is a chronic issue for her.  She has difficulty with exercise and she is mostly sedentary and uses a scooter for mobility.  She may benefit from cardiopulmonary rehab to increase her endurance.  See other related problems.

## 2017-10-12 NOTE — Progress Notes (Signed)
Anticoagulation Management Jacqueline Nguyen a 84 y.o.femalewho is onwarfarintreatment. She monitors INR at home through [facility], results were faxed in.  Indication: DVT and PEhistory, paroxysmal atrial fibrillation Duration: indefinite Supervising physician:Emily Mullen  Patient reports no signs or symptoms of bleeding or thromboembolism.  Anticoagulation Episode Summary    Current INR goal:   2.0-3.0  TTR:   69.2 % (1.2 y)  Next INR check:   10/15/2017  INR from last check:   1.9! (10/08/2017)  Weekly max warfarin dose:     Target end date:     INR check location:     Preferred lab:     Send INR reminders to:      Indications   History of DVT (deep vein thrombosis) [Z86.718] Anticoagulated on Coumadin [Z79.01] History of pulmonary embolism [Z86.711]       Comments:         Anticoagulation Care Providers    Provider Role Specialty Phone number   [name], MD Responsible Internal Medicine [phone]      Allergies  Allergen Reactions  . Ciprofloxacin   . Bactrim Rash  . Codeine Nausea Only   Medication Sig  acetic acid-hydrocortisone (VOSOL-HC) OTIC solution Place 3 drops into the right ear 4 (four) times daily. For 7 days  albuterol (PROVENTIL HFA;VENTOLIN HFA) 108 (90 Base) MCG/ACT inhaler Inhale 1-2 puffs into the lungs daily as needed for shortness of breath or wheezing.  Ascorbic Acid (VITAMIN C PO) Take 1 capsule by mouth daily.  atenolol (TENORMIN) 25 MG tablet Take 1 tablet (25 mg total) by mouth 4 (four) times daily as needed. MAY TAKE 1 TABLET 4 TIMES DAILY AS NEEDED.  AZOPT 1 % ophthalmic suspension Place 1 drop into both eyes 2 (two) times daily.  budesonide-formoterol (SYMBICORT) 160-4.5 MCG/ACT inhaler Inhale 2 puffs into the lungs every 12 (twelve) hours.  Cholecalciferol (VITAMIN D PO) Take 1 capsule by mouth daily.  clotrimazole (LOTRIMIN) 1 % cream Apply 1 application 2 (two) times daily topically. Approximately 1 gram to the affected area.   COMBIGAN 0.2-0.5 % ophthalmic solution Place 1 drop into both eyes 2 (two) times daily.  cyanocobalamin 1000 MCG tablet Take 1,000 mcg by mouth daily.  diltiazem (CARDIZEM CD) 120 MG 24 hr capsule TAKE ONE (1) CAPSULE BY MOUTH 2 TIMES DAILY  FLOVENT HFA 44 MCG/ACT inhaler INHALE 1 PUFF TWICE A DAY DAILY  furosemide (LASIX) 20 MG tablet TAKE 1 TABLET (20 MG TOTAL) BY MOUTH DAILY.  HYDROcodone-acetaminophen (NORCO) 10-325 MG tablet Take 1 tablet by mouth every 6 (six) hours as needed for severe pain.  levalbuterol (XOPENEX) 0.63 MG/3ML nebulizer solution Take 3 mLs (0.63 mg total) by nebulization every 8 (eight) hours as needed for wheezing.  meclizine (ANTIVERT) 25 MG tablet TAKE ONE TABLET BY MOUTH 2-3 TIMES DAILY AS NEEDED FOR VERTIGO  Multiple Vitamin (MULTIVITAMIN WITH MINERALS) TABS tablet Take 1 tablet by mouth daily.  NYAMYC powder APPLY TOPICALLY FOUR TIMES DAILY  Respiratory Therapy Supplies (FLUTTER) DEVI Blow through 4 times per set, three sets daily  Turmeric (RA TURMERIC) 500 MG CAPS Take 500 mg by mouth at bedtime.  warfarin (COUMADIN) 2.5 MG tablet Take 1 tablet (2.5 mg total) by mouth daily. exept take 1.5 tablet (3.75 mg) on Mondays   Past Medical History:  Diagnosis Date  . Arthritis   . Cataract   . Chronic respiratory failure ([place])   . Endometriosis   . HTN (hypertension)   . Hx of echocardiogram    Echo (  8/15):  Mild LVH, EF 65%, no RWMA, Ao sclerosis, no AS, mod MAC, mild LAE, normal RVSF, PASP 40 mmHg  . Hx pulmonary embolism    on chronic coumadin  . Obesities, morbid ([place])   . OSA (obstructive sleep apnea)   . Osteoporosis   . PAF (paroxysmal atrial fibrillation) ([place])    Event Monitor (8/15):  Frequent PACs, brief bursts of nonsustained ATach; no AFib  . Psoriasis   . Skin cancer   . Vertigo    Social History   Socioeconomic History  . Marital status: Widowed    Spouse name: Not on file  . Number of children: Not on file  . Years of education: Not on  file  . Highest education level: Not on file  Occupational History  . Not on file  Social Needs  . Financial resource strain: Not on file  . Food insecurity:    Worry: Not on file    Inability: Not on file  . Transportation needs:    Medical: Not on file    Non-medical: Not on file  Tobacco Use  . Smoking status: Former Smoker    Packs/day: 1.00    Years: 2.00    Pack years: 2.00    Types: Cigarettes    Last attempt to quit: 04/03/1981    Years since quitting: 36.5  . Smokeless tobacco: Never Used  Substance and Sexual Activity  . Alcohol use: No  . Drug use: No  . Sexual activity: Not Currently  Lifestyle  . Physical activity:    Days per week: Not on file    Minutes per session: Not on file  . Stress: Not on file  Relationships  . Social connections:    Talks on phone: Not on file    Gets together: Not on file    Attends religious service: Not on file    Active member of club or organization: Not on file    Attends meetings of clubs or organizations: Not on file    Relationship status: Not on file  Other Topics Concern  . Not on file  Social History Narrative   She lives with her sister in [place] and requires assistance at home. Has an aid who visits and helps at home often. Son works as a Catering manager and also visits and helps when he can. Semi-independent in ADLs, requires walker around home, dependent in IADLs.   Family History  Problem Relation Age of Onset  . Heart attack Father   . Heart attack Brother   . Stroke Neg Hx    ASSESSMENT Recent Results: Lab Results  Component Value Date   INR 1.9 (A) 10/08/2017   INR 1.8 (A) 10/01/2017   INR 2.5 (A) 09/24/2017   Anticoagulation Dosing: 2.5 mg daily, except 3.75 mg on Mondays   PLAN Will continue current dose and follow up in 1 week. If INR remains around 1.8-1.9, will plan to increase dose further.  Patient Instructions  Patient educated about medication as defined in this encounter and  verbalized understanding by repeating back instructions provided.   Patient advised to contact clinic or seek medical attention if signs/symptoms of bleeding or thromboembolism occur.  Patient verbalized understanding by repeating back information and was advised to contact me if further medication-related questions arise. Patient was also provided an information handout.  Follow-up 10/15/17  [name]

## 2017-10-12 NOTE — Patient Instructions (Signed)
Patient educated about medication as defined in this encounter and verbalized understanding by repeating back instructions provided.

## 2017-10-12 NOTE — Progress Notes (Signed)
I reviewed Dr. Julianne Rice note and agree with plan.

## 2017-10-15 LAB — POCT INR: INR: 1.7 — AB (ref 2.0–3.0)

## 2017-10-16 ENCOUNTER — Ambulatory Visit: Payer: Self-pay | Admitting: Student-PharmD

## 2017-10-16 DIAGNOSIS — Z86711 Personal history of pulmonary embolism: Secondary | ICD-10-CM

## 2017-10-16 DIAGNOSIS — Z7901 Long term (current) use of anticoagulants: Secondary | ICD-10-CM

## 2017-10-16 DIAGNOSIS — Z86718 Personal history of other venous thrombosis and embolism: Secondary | ICD-10-CM

## 2017-10-16 LAB — PROTIME-INR

## 2017-10-16 MED ORDER — WARFARIN SODIUM 3 MG PO TABS: 3.0000 mg | ORAL_TABLET | Freq: Every day | ORAL | 3 refills | Status: DC

## 2017-10-16 NOTE — Progress Notes (Signed)
Anticoagulation Management Jacqueline Nguyen is a 84 y.o. female who reports to the clinic for monitoring of warfarin treatment.    Indication: DVT and PEhistory, paroxysmal atrial fibrillation Duration: indefinite Supervising physician:[facility]  Anticoagulation Clinic Visit History: Patient does not report signs/symptoms of bleeding or thromboembolism.   Anticoagulation Episode Summary    Current INR goal:   2.0-3.0  Next INR check:   10/22/2017  INR from last check:   1.7! (10/15/2017)  Weekly max warfarin dose:     Target end date:     INR check location:     Preferred lab:     Send INR reminders to:      Indications   History of DVT (deep vein thrombosis) [Z86.718] Anticoagulated on Coumadin [Z79.01] History of pulmonary embolism [Z86.711]       Comments:         Anticoagulation Care Providers    Provider Role Specialty Phone number   [name], MD Responsible Internal Medicine [phone]      Allergies  Allergen Reactions  . Ciprofloxacin   . Bactrim Rash  . Codeine Nausea Only   Prior to Admission medications   Medication Sig Start Date End Date Taking? Authorizing Provider  acetic acid-hydrocortisone (VOSOL-HC) OTIC solution Place 3 drops into the right ear 4 (four) times daily. For 7 days 10/10/17   [name], MD  albuterol (PROVENTIL HFA;VENTOLIN HFA) 108 [phone] Base) MCG/ACT inhaler Inhale 1-2 puffs into the lungs daily as needed for shortness of breath or wheezing. 08/22/17   [name], MD  Ascorbic Acid (VITAMIN C PO) Take 1 capsule by mouth daily.    [provider]  atenolol (TENORMIN) 25 MG tablet Take 1 tablet (25 mg total) by mouth 4 (four) times daily as needed. MAY TAKE 1 TABLET 4 TIMES DAILY AS NEEDED. 04/17/17   [name], MD  AZOPT 1 % ophthalmic suspension Place 1 drop into both eyes 2 (two) times daily. 06/29/15   [provider]  budesonide-formoterol (SYMBICORT) 160-4.5 MCG/ACT inhaler Inhale 2 puffs into the  lungs every 12 (twelve) hours. 07/30/17   Oval Linsey, MD  Cholecalciferol (VITAMIN D PO) Take 1 capsule by mouth daily.    [provider]  clotrimazole (LOTRIMIN) 1 % cream Apply 1 application 2 (two) times daily topically. Approximately 1 gram to the affected area. 02/14/17   Kathi Ludwig, MD  COMBIGAN 0.2-0.5 % ophthalmic solution Place 1 drop into both eyes 2 (two) times daily. 07/01/15   [provider]  cyanocobalamin 1000 MCG tablet Take 1,000 mcg by mouth daily.    [provider]  diltiazem (CARDIZEM CD) 120 MG 24 hr capsule TAKE ONE (1) CAPSULE BY MOUTH 2 TIMES DAILY 12/21/16   Croitoru, Mihai, MD  FLOVENT HFA 44 MCG/ACT inhaler INHALE 1 PUFF TWICE A DAY DAILY 12/11/16   [name], MD  furosemide (LASIX) 20 MG tablet TAKE 1 TABLET (20 MG TOTAL) BY MOUTH DAILY. 01/12/17   Axel Filler, MD  HYDROcodone-acetaminophen [facility]) 10-325 MG tablet Take 1 tablet by mouth every 6 (six) hours as needed for severe pain. 07/17/17   Bartholomew Crews, MD  levalbuterol Penne Lash) 0.63 MG/3ML nebulizer solution Take 3 mLs (0.63 mg total) by nebulization every 8 (eight) hours as needed for wheezing. 01/10/17   [name], MD  meclizine (ANTIVERT) 25 MG tablet TAKE ONE TABLET BY MOUTH 2-3 TIMES DAILY AS NEEDED FOR VERTIGO 10/08/17   [name], MD  Multiple Vitamin (MULTIVITAMIN WITH MINERALS) TABS tablet Take 1 tablet by mouth daily.    [provider]  [facility] powder APPLY TOPICALLY FOUR TIMES DAILY 09/24/17   [name], MD  Respiratory Therapy Supplies (FLUTTER) DEVI Blow through 4 times per set, three sets daily 12/28/14   Baird Lyons D, MD  Turmeric (RA TURMERIC) 500 MG CAPS Take 500 mg by mouth at bedtime.    [provider]  warfarin (COUMADIN) 2.5 MG tablet Take 1 tablet (2.5 mg total) by mouth daily. exept take 1.5 tablet (3.75 mg) on Mondays 08/15/17   Collier Salina, MD   Past Medical History:  Diagnosis Date   . Arthritis   . Cataract   . Chronic respiratory failure ([place])   . Endometriosis   . HTN (hypertension)   . Hx of echocardiogram    Echo (8/15):  Mild LVH, EF 65%, no RWMA, Ao sclerosis, no AS, mod MAC, mild LAE, normal RVSF, PASP 40 mmHg  . Hx pulmonary embolism    on chronic coumadin  . Obesities, morbid ([place])   . OSA (obstructive sleep apnea)   . Osteoporosis   . PAF (paroxysmal atrial fibrillation) ([place])    Event Monitor (8/15):  Frequent PACs, brief bursts of nonsustained ATach; no AFib  . Psoriasis   . Skin cancer   . Vertigo    Social History   Socioeconomic History  . Marital status: Widowed    Spouse name: Not on file  . Number of children: Not on file  . Years of education: Not on file  . Highest education level: Not on file  Occupational History  . Not on file  Social Needs  . Financial resource strain: Not on file  . Food insecurity:    Worry: Not on file    Inability: Not on file  . Transportation needs:    Medical: Not on file    Non-medical: Not on file  Tobacco Use  . Smoking status: Former Smoker    Packs/day: 1.00    Years: 2.00    Pack years: 2.00    Types: Cigarettes    Last attempt to quit: 04/03/1981    Years since quitting: 36.5  . Smokeless tobacco: Never Used  Substance and Sexual Activity  . Alcohol use: No  . Drug use: No  . Sexual activity: Not Currently  Lifestyle  . Physical activity:    Days per week: Not on file    Minutes per session: Not on file  . Stress: Not on file  Relationships  . Social connections:    Talks on phone: Not on file    Gets together: Not on file    Attends religious service: Not on file    Active member of club or organization: Not on file    Attends meetings of clubs or organizations: Not on file    Relationship status: Not on file  Other Topics Concern  . Not on file  Social History Narrative   She lives with her sister in [place] and requires assistance at home. Has an aid who visits and helps  at home often. Son works as a Catering manager and also visits and helps when he can. Semi-independent in ADLs, requires walker around home, dependent in IADLs.   Family History  Problem Relation Age of Onset  . Heart attack Father   . Heart attack Brother   . Stroke Neg Hx     ASSESSMENT Recent Results: The most recent result  is correlated with 18.75 mg per week: Lab Results  Component Value Date   INR 1.7 (A) 10/15/2017   INR 1.9 (A) 10/08/2017   INR 1.8 (A) 10/01/2017    Anticoagulation Dosing: 2.5 mg daily, except 3.75 mg on Mondays   INR today: Subtherapeutic  PLAN Switch to 3 mg warfarin daily.   Weekly dose increased by 10.7% to 21 mg per week.  There are no Patient Instructions on file for this visit. Patient advised to contact clinic or seek medical attention if signs/symptoms of bleeding or thromboembolism occur.  Patient verbalized understanding by repeating back information and was advised to contact me if further medication-related questions arise.   Follow-up 10/22/17  Oralia Manis  PharmD Candidate

## 2017-10-17 ENCOUNTER — Telehealth: Payer: Self-pay

## 2017-10-17 DIAGNOSIS — R002 Palpitations: Secondary | ICD-10-CM | POA: Diagnosis not present

## 2017-10-17 DIAGNOSIS — Z719 Counseling, unspecified: Secondary | ICD-10-CM | POA: Diagnosis not present

## 2017-10-17 NOTE — Telephone Encounter (Signed)
Primary Cardiologist:  Dr Sallyanne Kuster  Chart reviewed and patient contacted by phone today as part of pre-operative protocol coverage. Given past medical history and time since last visit, based on ACC/AHA guidelines, Lawonda Pretlow would be at acceptable risk for the planned procedure without further cardiovascular testing.   OK to hold Coumadin 2 days pre op and resume ASAP post op.   I will route this recommendation to the requesting party via Epic fax function and remove from pre-op pool.  Please call with questions.  Kerin Ransom, PA-C 10/17/2017, 4:13 PM

## 2017-10-17 NOTE — Telephone Encounter (Signed)
I tried calling- unable to leave a message- "mailbox full".   Kerin Ransom PA-C 10/17/2017 3:59 PM

## 2017-10-17 NOTE — Telephone Encounter (Signed)
   [facility] Pre-operative Risk Assessment    Request for surgical clearance:  1. What type of surgery is being performed? Surgical extraction of teeth 2, 18 with local anesthetic. Will place local hemostatic agents following extractions.   2. When is this surgery scheduled? TBA   3. What type of clearance is required (medical clearance vs. Pharmacy clearance to hold med vs. Both)? BOTH  4. Are there any medications that need to be held prior to surgery and how long? Can patient stop coumadin for 5 days prior to surgery and then resume coumadin the day following surgery to aid in perioperative hemostasis   5. Practice name and name of physician performing surgery? [facility], Dr. Chana Bode and Dr. Marge Duncans   6. What is your office phone number [phone]    7.   What is your office fax number [phone]  8.   Anesthesia type (None, local, MAC, general) ? Local   Jacqueline Nguyen 10/17/2017, 10:45 AM  _________________________________________________________________   (provider comments below)

## 2017-10-17 NOTE — Telephone Encounter (Signed)
Pt takes warfarin for afib with CHADS2VASc score of 5 (age x2, sex, HTN, CHF, VTE), also with hx of DVT and bilateral PE in 2009. Pt is morbidly obese and sedentary. Recommend only holding warfarin up to 2 days prior to extractions if holding warfarin is necessary due to high risk for stroke/VTE off of anticoagulation.

## 2017-10-18 ENCOUNTER — Other Ambulatory Visit: Payer: Medicare Other

## 2017-10-18 ENCOUNTER — Ambulatory Visit (INDEPENDENT_AMBULATORY_CARE_PROVIDER_SITE_OTHER)
Admission: RE | Admit: 2017-10-18 | Discharge: 2017-10-18 | Disposition: A | Discharge status: Home / Self Care | Payer: Medicare Other | Source: Ambulatory Visit | Attending: Internal Medicine | Admitting: Internal Medicine

## 2017-10-18 ENCOUNTER — Telehealth: Payer: Self-pay | Admitting: Internal Medicine

## 2017-10-18 ENCOUNTER — Ambulatory Visit
Admission: RE | Admit: 2017-10-18 | Discharge: 2017-10-18 | Disposition: A | Discharge status: Home / Self Care | Payer: Medicare Other | Source: Ambulatory Visit | Attending: Internal Medicine | Admitting: Internal Medicine

## 2017-10-18 DIAGNOSIS — R599 Enlarged lymph nodes, unspecified: Secondary | ICD-10-CM

## 2017-10-18 DIAGNOSIS — J9611 Chronic respiratory failure with hypoxia: Secondary | ICD-10-CM | POA: Diagnosis not present

## 2017-10-18 DIAGNOSIS — J9691 Respiratory failure, unspecified with hypoxia: Secondary | ICD-10-CM | POA: Diagnosis not present

## 2017-10-18 NOTE — Telephone Encounter (Signed)
Spoke with Stacy at [facility]. States that pt is to having a CT of her chest. They have been trying to get her on the table for 30-40 min. Pt can't lay flat on the table.  Spoke with CY. He states to not worry about the CT but have the pt complete a CXR. Order has been placed. Nothing further was needed.

## 2017-10-18 NOTE — Progress Notes (Signed)
I reviewed the pharmacy note, patient's INR is low, and dose of coumadin increased.  Appreciate pharmacy support.

## 2017-10-19 DIAGNOSIS — J449 Chronic obstructive pulmonary disease, unspecified: Secondary | ICD-10-CM | POA: Diagnosis not present

## 2017-10-22 DIAGNOSIS — I480 Paroxysmal atrial fibrillation: Secondary | ICD-10-CM | POA: Diagnosis not present

## 2017-10-22 DIAGNOSIS — Z7901 Long term (current) use of anticoagulants: Secondary | ICD-10-CM | POA: Diagnosis not present

## 2017-10-23 ENCOUNTER — Encounter: Payer: Self-pay | Admitting: Student-PharmD

## 2017-10-23 DIAGNOSIS — Z7901 Long term (current) use of anticoagulants: Secondary | ICD-10-CM

## 2017-10-23 DIAGNOSIS — Z86718 Personal history of other venous thrombosis and embolism: Secondary | ICD-10-CM

## 2017-10-23 DIAGNOSIS — Z86711 Personal history of pulmonary embolism: Secondary | ICD-10-CM

## 2017-10-23 LAB — POCT INR: INR: 2.7 (ref 2.0–3.0)

## 2017-10-23 NOTE — Progress Notes (Signed)
Anticoagulation Management Jacqueline Nguyen is a 84 y.o. female who reports to the clinic for monitoring of warfarin treatment.    Indication: DVT and PEhistory, paroxysmal atrial fibrillation Duration: indefinite Supervising physician:[facility]  Anticoagulation Clinic Visit History: Patient does not report signs/symptoms of bleeding or thromboembolism and does not report any changes in diet, medications, or lifestyle.  Anticoagulation Episode Summary    Current INR goal:   2.0-3.0  TTR:   68.2 % (1.2 y)  Next INR check:   10/22/2017  INR from last check:   1.7! (10/15/2017)  Most recent INR:    2.7 (10/23/2017)  Weekly max warfarin dose:     Target end date:     INR check location:     Preferred lab:     Send INR reminders to:      Indications   History of DVT (deep vein thrombosis) [Z86.718] Anticoagulated on Coumadin [Z79.01] History of pulmonary embolism [Z86.711]       Comments:         Anticoagulation Care Providers    Provider Role Specialty Phone number   [name], MD Responsible Internal Medicine [phone]      Allergies  Allergen Reactions  . Ciprofloxacin   . Bactrim Rash  . Codeine Nausea Only   Prior to Admission medications   Medication Sig Start Date End Date Taking? Authorizing Provider  acetic acid-hydrocortisone (VOSOL-HC) OTIC solution Place 3 drops into the right ear 4 (four) times daily. For 7 days 10/10/17   [name], MD  albuterol (PROVENTIL HFA;VENTOLIN HFA) 108 [phone] Base) MCG/ACT inhaler Inhale 1-2 puffs into the lungs daily as needed for shortness of breath or wheezing. 08/22/17   [name], MD  Ascorbic Acid (VITAMIN C PO) Take 1 capsule by mouth daily.    [provider]  atenolol (TENORMIN) 25 MG tablet Take 1 tablet (25 mg total) by mouth 4 (four) times daily as needed. MAY TAKE 1 TABLET 4 TIMES DAILY AS NEEDED. 04/17/17   [name], MD  AZOPT 1 % ophthalmic suspension Place 1 drop into both eyes 2 (two)  times daily. 06/29/15   [provider]  budesonide-formoterol (SYMBICORT) 160-4.5 MCG/ACT inhaler Inhale 2 puffs into the lungs every 12 (twelve) hours. 07/30/17   Oval Linsey, MD  Cholecalciferol (VITAMIN D PO) Take 1 capsule by mouth daily.    [provider]  clotrimazole (LOTRIMIN) 1 % cream Apply 1 application 2 (two) times daily topically. Approximately 1 gram to the affected area. 02/14/17   Kathi Ludwig, MD  COMBIGAN 0.2-0.5 % ophthalmic solution Place 1 drop into both eyes 2 (two) times daily. 07/01/15   [provider]  cyanocobalamin 1000 MCG tablet Take 1,000 mcg by mouth daily.    [provider]  diltiazem (CARDIZEM CD) 120 MG 24 hr capsule TAKE ONE (1) CAPSULE BY MOUTH 2 TIMES DAILY 12/21/16   Croitoru, Mihai, MD  FLOVENT HFA 44 MCG/ACT inhaler INHALE 1 PUFF TWICE A DAY DAILY 12/11/16   [name], MD  furosemide (LASIX) 20 MG tablet TAKE 1 TABLET (20 MG TOTAL) BY MOUTH DAILY. 01/12/17   Axel Filler, MD  HYDROcodone-acetaminophen [facility]) 10-325 MG tablet Take 1 tablet by mouth every 6 (six) hours as needed for severe pain. 07/17/17   Bartholomew Crews, MD  levalbuterol [facility]) 0.63 MG/3ML nebulizer solution Take 3 mLs (0.63 mg total) by nebulization every 8 (eight) hours as needed for wheezing. 01/10/17   [name],  MD  meclizine (ANTIVERT) 25 MG tablet TAKE ONE TABLET BY MOUTH 2-3 TIMES DAILY AS NEEDED FOR VERTIGO 10/08/17   [name], MD  Multiple Vitamin (MULTIVITAMIN WITH MINERALS) TABS tablet Take 1 tablet by mouth daily.    [provider]  [facility] powder APPLY TOPICALLY FOUR TIMES DAILY 09/24/17   [name], MD  Respiratory Therapy Supplies (FLUTTER) DEVI Blow through 4 times per set, three sets daily 12/28/14   Baird Lyons D, MD  Turmeric (RA TURMERIC) 500 MG CAPS Take 500 mg by mouth at bedtime.    [provider]  warfarin (COUMADIN) 3 MG tablet Take 1 tablet (3 mg total) by mouth  daily. 10/16/17   [name], MD   Past Medical History:  Diagnosis Date  . Arthritis   . Cataract   . Chronic respiratory failure ([place])   . Endometriosis   . HTN (hypertension)   . Hx of echocardiogram    Echo (8/15):  Mild LVH, EF 65%, no RWMA, Ao sclerosis, no AS, mod MAC, mild LAE, normal RVSF, PASP 40 mmHg  . Hx pulmonary embolism    on chronic coumadin  . Obesities, morbid ([place])   . OSA (obstructive sleep apnea)   . Osteoporosis   . PAF (paroxysmal atrial fibrillation) ([place])    Event Monitor (8/15):  Frequent PACs, brief bursts of nonsustained ATach; no AFib  . Psoriasis   . Skin cancer   . Vertigo    Social History   Socioeconomic History  . Marital status: Widowed    Spouse name: Not on file  . Number of children: Not on file  . Years of education: Not on file  . Highest education level: Not on file  Occupational History  . Not on file  Social Needs  . Financial resource strain: Not on file  . Food insecurity:    Worry: Not on file    Inability: Not on file  . Transportation needs:    Medical: Not on file    Non-medical: Not on file  Tobacco Use  . Smoking status: Former Smoker    Packs/day: 1.00    Years: 2.00    Pack years: 2.00    Types: Cigarettes    Last attempt to quit: 04/03/1981    Years since quitting: 36.5  . Smokeless tobacco: Never Used  Substance and Sexual Activity  . Alcohol use: No  . Drug use: No  . Sexual activity: Not Currently  Lifestyle  . Physical activity:    Days per week: Not on file    Minutes per session: Not on file  . Stress: Not on file  Relationships  . Social connections:    Talks on phone: Not on file    Gets together: Not on file    Attends religious service: Not on file    Active member of club or organization: Not on file    Attends meetings of clubs or organizations: Not on file    Relationship status: Not on file  Other Topics Concern  . Not on file  Social History Narrative   She lives with her  sister in [place] and requires assistance at home. Has an aid who visits and helps at home often. Son works as a Catering manager and also visits and helps when he can. Semi-independent in ADLs, requires walker around home, dependent in IADLs.   Family History  Problem Relation Age of Onset  . Heart attack Father   . Heart  attack Brother   . Stroke Neg Hx     ASSESSMENT Recent Results: The most recent result is correlated with 21 mg per week: Lab Results  Component Value Date   INR 2.7 10/23/2017   INR 1.7 (A) 10/15/2017   INR 1.9 (A) 10/08/2017    Anticoagulation Dosing: 3 mg daily   INR today: Therapeutic  PLAN Weekly dose was unchanged   There are no Patient Instructions on file for this visit. Patient advised to contact clinic or seek medical attention if signs/symptoms of bleeding or thromboembolism occur.  Patient verbalized understanding by repeating back information and was advised to contact me if further medication-related questions arise.   Follow-up 1 week  Oralia Manis, PharmD Candidate

## 2017-10-25 LAB — PROTIME-INR

## 2017-10-27 DIAGNOSIS — G4733 Obstructive sleep apnea (adult) (pediatric): Secondary | ICD-10-CM | POA: Diagnosis not present

## 2017-10-29 LAB — POCT INR: INR: 3.1 — AB (ref 2.0–3.0)

## 2017-10-30 ENCOUNTER — Telehealth: Payer: Self-pay | Admitting: *Deleted

## 2017-10-30 ENCOUNTER — Encounter: Payer: Self-pay | Admitting: Student-PharmD

## 2017-10-30 DIAGNOSIS — Z86711 Personal history of pulmonary embolism: Secondary | ICD-10-CM

## 2017-10-30 DIAGNOSIS — Z86718 Personal history of other venous thrombosis and embolism: Secondary | ICD-10-CM

## 2017-10-30 DIAGNOSIS — Z7901 Long term (current) use of anticoagulants: Secondary | ICD-10-CM

## 2017-10-30 NOTE — Progress Notes (Addendum)
Anticoagulation Management Jacqueline Nguyen is a 84 y.o. female who reports to the clinic for monitoring of warfarin treatment.    Indication: DVT and PEhistory, paroxysmal atrial fibrillation Duration: indefinite Supervising physician:[facility]  Anticoagulation Clinic Visit History: Patient does not report signs/symptoms of bleeding or thromboembolism and does not report any changes in diet, medications, or lifestyle.  Anticoagulation Episode Summary    Current INR goal:   2.0-3.0  TTR:   68.3 % (1.3 y)  Next INR check:   11/06/2017  INR from last check:   3.1! (10/29/2017)  Weekly max warfarin dose:     Target end date:     INR check location:     Preferred lab:     Send INR reminders to:      Indications   History of DVT (deep vein thrombosis) [Z86.718] Anticoagulated on Coumadin [Z79.01] History of pulmonary embolism [Z86.711]       Comments:         Anticoagulation Care Providers    Provider Role Specialty Phone number   [name], MD Responsible Internal Medicine [phone]      Allergies  Allergen Reactions  . Ciprofloxacin   . Bactrim Rash  . Codeine Nausea Only   Medication Sig  acetic acid-hydrocortisone (VOSOL-HC) OTIC solution Place 3 drops into the right ear 4 (four) times daily. For 7 days  albuterol (PROVENTIL HFA;VENTOLIN HFA) 108 (90 Base) MCG/ACT inhaler Inhale 1-2 puffs into the lungs daily as needed for shortness of breath or wheezing.  Ascorbic Acid (VITAMIN C PO) Take 1 capsule by mouth daily.  atenolol (TENORMIN) 25 MG tablet Take 1 tablet (25 mg total) by mouth 4 (four) times daily as needed. MAY TAKE 1 TABLET 4 TIMES DAILY AS NEEDED.  AZOPT 1 % ophthalmic suspension Place 1 drop into both eyes 2 (two) times daily.  budesonide-formoterol (SYMBICORT) 160-4.5 MCG/ACT inhaler Inhale 2 puffs into the lungs every 12 (twelve) hours.  Cholecalciferol (VITAMIN D PO) Take 1 capsule by mouth daily.  clotrimazole (LOTRIMIN) 1 % cream Apply 1  application 2 (two) times daily topically. Approximately 1 gram to the affected area.  COMBIGAN 0.2-0.5 % ophthalmic solution Place 1 drop into both eyes 2 (two) times daily.  cyanocobalamin 1000 MCG tablet Take 1,000 mcg by mouth daily.  diltiazem (CARDIZEM CD) 120 MG 24 hr capsule TAKE ONE (1) CAPSULE BY MOUTH 2 TIMES DAILY  FLOVENT HFA 44 MCG/ACT inhaler INHALE 1 PUFF TWICE A DAY DAILY  furosemide (LASIX) 20 MG tablet TAKE 1 TABLET (20 MG TOTAL) BY MOUTH DAILY.  HYDROcodone-acetaminophen (NORCO) 10-325 MG tablet Take 1 tablet by mouth every 6 (six) hours as needed for severe pain.  levalbuterol (XOPENEX) 0.63 MG/3ML nebulizer solution Take 3 mLs (0.63 mg total) by nebulization every 8 (eight) hours as needed for wheezing.  meclizine (ANTIVERT) 25 MG tablet TAKE ONE TABLET BY MOUTH 2-3 TIMES DAILY AS NEEDED FOR VERTIGO  Multiple Vitamin (MULTIVITAMIN WITH MINERALS) TABS tablet Take 1 tablet by mouth daily.  NYAMYC powder APPLY TOPICALLY FOUR TIMES DAILY  Respiratory Therapy Supplies (FLUTTER) DEVI Blow through 4 times per set, three sets daily  Turmeric (RA TURMERIC) 500 MG CAPS Take 500 mg by mouth at bedtime.  warfarin (COUMADIN) 3 MG tablet Take 1 tablet (3 mg total) by mouth daily.   Past Medical History:  Diagnosis Date  . Arthritis   . Cataract   . Chronic respiratory failure ([place])   . Endometriosis   . HTN (hypertension)   .  Hx of echocardiogram    Echo (8/15):  Mild LVH, EF 65%, no RWMA, Ao sclerosis, no AS, mod MAC, mild LAE, normal RVSF, PASP 40 mmHg  . Hx pulmonary embolism    on chronic coumadin  . Obesities, morbid ([place])   . OSA (obstructive sleep apnea)   . Osteoporosis   . PAF (paroxysmal atrial fibrillation) ([place])    Event Monitor (8/15):  Frequent PACs, brief bursts of nonsustained ATach; no AFib  . Psoriasis   . Skin cancer   . Vertigo    Social History   Socioeconomic History  . Marital status: Widowed    Spouse name: Not on file  . Number of children: Not  on file  . Years of education: Not on file  . Highest education level: Not on file  Occupational History  . Not on file  Social Needs  . Financial resource strain: Not on file  . Food insecurity:    Worry: Not on file    Inability: Not on file  . Transportation needs:    Medical: Not on file    Non-medical: Not on file  Tobacco Use  . Smoking status: Former Smoker    Packs/day: 1.00    Years: 2.00    Pack years: 2.00    Types: Cigarettes    Last attempt to quit: 04/03/1981    Years since quitting: 36.6  . Smokeless tobacco: Never Used  Substance and Sexual Activity  . Alcohol use: No  . Drug use: No  . Sexual activity: Not Currently  Lifestyle  . Physical activity:    Days per week: Not on file    Minutes per session: Not on file  . Stress: Not on file  Relationships  . Social connections:    Talks on phone: Not on file    Gets together: Not on file    Attends religious service: Not on file    Active member of club or organization: Not on file    Attends meetings of clubs or organizations: Not on file    Relationship status: Not on file  Other Topics Concern  . Not on file  Social History Narrative   She lives with her sister in [place] and requires assistance at home. Has an aid who visits and helps at home often. Son works as a Catering manager and also visits and helps when he can. Semi-independent in ADLs, requires walker around home, dependent in IADLs.   Family History  Problem Relation Age of Onset  . Heart attack Father   . Heart attack Brother   . Stroke Neg Hx    ASSESSMENT Recent Results: The most recent result is correlated with 21 mg per week: Lab Results  Component Value Date   INR 3.1 (A) 10/29/2017   INR 2.7 10/23/2017   INR 1.7 (A) 10/15/2017   Anticoagulation Dosing: 3 mg daily   INR today: Supratherapeutic  PLAN Take warfarin 3 mg every day except take 1/2 tablet (1.5 mg) every Monday. Patient reports Mondays as easiest day to  remember a different dosage because that is when she thinks about her weekly INR.  Weekly dose was decreased by 7.1% to 19.5 mg per week  Patient advised to contact clinic or seek medical attention if signs/symptoms of bleeding or thromboembolism occur.  Patient verbalized understanding by repeating back information and was advised to contact me if further medication-related questions arise.   Follow-up 1 week for INR check  [name]

## 2017-10-30 NOTE — Telephone Encounter (Signed)
[facility] called to confirm that the INR for today was rec'd, confirmed

## 2017-10-31 ENCOUNTER — Other Ambulatory Visit: Payer: Self-pay | Admitting: *Deleted

## 2017-10-31 MED ORDER — HYDROCODONE-ACETAMINOPHEN 10-325 MG PO TABS: 1.0000 | ORAL_TABLET | Freq: Four times a day (QID) | ORAL | 0 refills | Status: DC | PRN

## 2017-10-31 NOTE — Telephone Encounter (Signed)
Refills X 3 sent.

## 2017-11-02 DIAGNOSIS — M79671 Pain in right foot: Secondary | ICD-10-CM | POA: Diagnosis not present

## 2017-11-02 DIAGNOSIS — I739 Peripheral vascular disease, unspecified: Secondary | ICD-10-CM | POA: Diagnosis not present

## 2017-11-02 DIAGNOSIS — R609 Edema, unspecified: Secondary | ICD-10-CM | POA: Diagnosis not present

## 2017-11-02 LAB — PROTIME-INR

## 2017-11-04 DIAGNOSIS — G4733 Obstructive sleep apnea (adult) (pediatric): Secondary | ICD-10-CM | POA: Diagnosis not present

## 2017-11-05 LAB — POCT INR: INR: 3.1 — AB (ref 2.0–3.0)

## 2017-11-05 NOTE — Telephone Encounter (Signed)
Follow up   Please contact patient's son [name] regarding clearance information. Please re-fax clearance to : fax number [phone]

## 2017-11-06 ENCOUNTER — Telehealth: Payer: Self-pay | Admitting: *Deleted

## 2017-11-06 ENCOUNTER — Encounter: Payer: Self-pay | Admitting: Pharmacist

## 2017-11-06 DIAGNOSIS — Z86711 Personal history of pulmonary embolism: Secondary | ICD-10-CM

## 2017-11-06 DIAGNOSIS — Z86718 Personal history of other venous thrombosis and embolism: Secondary | ICD-10-CM

## 2017-11-06 DIAGNOSIS — Z7901 Long term (current) use of anticoagulants: Secondary | ICD-10-CM

## 2017-11-06 NOTE — Telephone Encounter (Signed)
Thank you

## 2017-11-06 NOTE — Progress Notes (Signed)
Anticoagulation Management Jacqueline Nguyen is a 84 y.o. female who reports to the clinic for monitoring of warfarin treatment.    Indication: DVT and PEhistory, paroxysmal atrial fibrillation Duration: indefinite Supervising physician:[facility]  Anticoagulation Clinic Visit History: Patient does not report signs/symptoms of bleeding or thromboembolism and does not report any changes in diet, medications, or lifestyle.  Anticoagulation Episode Summary    Current INR goal:   2.0-3.0  TTR:   67.2 % (1.3 y)  Next INR check:   11/12/2017  INR from last check:   3.1! (11/05/2017)  Weekly max warfarin dose:     Target end date:     INR check location:     Preferred lab:     Send INR reminders to:      Indications   History of DVT (deep vein thrombosis) [Z86.718] Anticoagulated on Coumadin [Z79.01] History of pulmonary embolism [Z86.711]       Comments:         Anticoagulation Care Providers    Provider Role Specialty Phone number   [name], MD Responsible Internal Medicine [phone]     Allergies  Allergen Reactions  . Ciprofloxacin   . Bactrim Rash  . Codeine Nausea Only   Medication Sig  acetic acid-hydrocortisone (VOSOL-HC) OTIC solution Place 3 drops into the right ear 4 (four) times daily. For 7 days  albuterol (PROVENTIL HFA;VENTOLIN HFA) 108 (90 Base) MCG/ACT inhaler Inhale 1-2 puffs into the lungs daily as needed for shortness of breath or wheezing.  Ascorbic Acid (VITAMIN C PO) Take 1 capsule by mouth daily.  atenolol (TENORMIN) 25 MG tablet Take 1 tablet (25 mg total) by mouth 4 (four) times daily as needed. MAY TAKE 1 TABLET 4 TIMES DAILY AS NEEDED.  AZOPT 1 % ophthalmic suspension Place 1 drop into both eyes 2 (two) times daily.  budesonide-formoterol (SYMBICORT) 160-4.5 MCG/ACT inhaler Inhale 2 puffs into the lungs every 12 (twelve) hours.  Cholecalciferol (VITAMIN D PO) Take 1 capsule by mouth daily.  clotrimazole (LOTRIMIN) 1 % cream Apply 1  application 2 (two) times daily topically. Approximately 1 gram to the affected area.  COMBIGAN 0.2-0.5 % ophthalmic solution Place 1 drop into both eyes 2 (two) times daily.  cyanocobalamin 1000 MCG tablet Take 1,000 mcg by mouth daily.  diltiazem (CARDIZEM CD) 120 MG 24 hr capsule TAKE ONE (1) CAPSULE BY MOUTH 2 TIMES DAILY  FLOVENT HFA 44 MCG/ACT inhaler INHALE 1 PUFF TWICE A DAY DAILY  furosemide (LASIX) 20 MG tablet TAKE 1 TABLET (20 MG TOTAL) BY MOUTH DAILY.  HYDROcodone-acetaminophen (NORCO) 10-325 MG tablet Take 1 tablet by mouth every 6 (six) hours as needed for severe pain.  levalbuterol (XOPENEX) 0.63 MG/3ML nebulizer solution Take 3 mLs (0.63 mg total) by nebulization every 8 (eight) hours as needed for wheezing.  meclizine (ANTIVERT) 25 MG tablet TAKE ONE TABLET BY MOUTH 2-3 TIMES DAILY AS NEEDED FOR VERTIGO  Multiple Vitamin (MULTIVITAMIN WITH MINERALS) TABS tablet Take 1 tablet by mouth daily.  NYAMYC powder APPLY TOPICALLY FOUR TIMES DAILY  Respiratory Therapy Supplies (FLUTTER) DEVI Blow through 4 times per set, three sets daily  Turmeric (RA TURMERIC) 500 MG CAPS Take 500 mg by mouth at bedtime.  warfarin (COUMADIN) 3 MG tablet Take 1 tablet (3 mg total) by mouth daily.   Past Medical History:  Diagnosis Date  . Arthritis   . Cataract   . Chronic respiratory failure ([place])   . Endometriosis   . HTN (hypertension)   . Hx  of echocardiogram    Echo (8/15):  Mild LVH, EF 65%, no RWMA, Ao sclerosis, no AS, mod MAC, mild LAE, normal RVSF, PASP 40 mmHg  . Hx pulmonary embolism    on chronic coumadin  . Obesities, morbid ([place])   . OSA (obstructive sleep apnea)   . Osteoporosis   . PAF (paroxysmal atrial fibrillation) ([place])    Event Monitor (8/15):  Frequent PACs, brief bursts of nonsustained ATach; no AFib  . Psoriasis   . Skin cancer   . Vertigo    Social History   Socioeconomic History  . Marital status: Widowed    Spouse name: Not on file  . Number of children: Not  on file  . Years of education: Not on file  . Highest education level: Not on file  Occupational History  . Not on file  Social Needs  . Financial resource strain: Not on file  . Food insecurity:    Worry: Not on file    Inability: Not on file  . Transportation needs:    Medical: Not on file    Non-medical: Not on file  Tobacco Use  . Smoking status: Former Smoker    Packs/day: 1.00    Years: 2.00    Pack years: 2.00    Types: Cigarettes    Last attempt to quit: 04/03/1981    Years since quitting: 36.6  . Smokeless tobacco: Never Used  Substance and Sexual Activity  . Alcohol use: No  . Drug use: No  . Sexual activity: Not Currently  Lifestyle  . Physical activity:    Days per week: Not on file    Minutes per session: Not on file  . Stress: Not on file  Relationships  . Social connections:    Talks on phone: Not on file    Gets together: Not on file    Attends religious service: Not on file    Active member of club or organization: Not on file    Attends meetings of clubs or organizations: Not on file    Relationship status: Not on file  Other Topics Concern  . Not on file  Social History Narrative   She lives with her sister in [place] and requires assistance at home. Has an aid who visits and helps at home often. Son works as a Catering manager and also visits and helps when he can. Semi-independent in ADLs, requires walker around home, dependent in IADLs.   Family History  Problem Relation Age of Onset  . Heart attack Father   . Heart attack Brother   . Stroke Neg Hx    ASSESSMENT Lab Results  Component Value Date   INR 3.1 (A) 11/05/2017   INR 3.1 (A) 10/29/2017   INR 2.7 10/23/2017   Anticoagulation Dosing:   INR today: slightly Supratherapeutic  PLAN Weekly dose (19.5 mg/week) was unchanged since patient was previously subtherapeutic x 4 in July on 17.5 mg/week, but will monitor closely.  There are no Patient Instructions on file for this  visit. Patient advised to contact clinic or seek medical attention if signs/symptoms of bleeding or thromboembolism occur.  Patient verbalized understanding by repeating back information and was advised to contact me if further medication-related questions arise. Patient was also provided an information handout.  Follow-up Return in about 1 week (around 11/13/2017).  [name]

## 2017-11-06 NOTE — Telephone Encounter (Signed)
[facility] calls and states INR 11/05/2017 was                     3.1 Sending to dr Maudie Mercury and Daryll Drown

## 2017-11-06 NOTE — Patient Instructions (Signed)
Patient educated about medication as defined in this encounter and verbalized understanding by repeating back instructions provided.

## 2017-11-07 ENCOUNTER — Telehealth: Payer: Self-pay

## 2017-11-07 NOTE — Telephone Encounter (Signed)
Would like to speak with Bonnita Nasuti. Please call pt back.

## 2017-11-07 NOTE — Telephone Encounter (Signed)
Pt calls and states she will have a tooth extraction Friday, states she is worried because of a problem she had years ago with a tooth extraction and worries that she might need an abx before having the procedure. Also she states her ear is no better since using the med dr Daryll Drown prescribed for it at last visit. She is advised to call the dentist and inform his staff of her dental worries and tell them about her experience years ago that he would be most appropriate to prescribe abx for that, she is also informed dr Daryll Drown will be brought up to date about her ear problem. She is agreeable

## 2017-11-07 NOTE — Telephone Encounter (Signed)
Routing to call back pool to discuss with family member and to refax clearance.  Burtis Junes, RN, [place] [address] [place], [place]  [address] [phone]

## 2017-11-08 LAB — PROTIME-INR

## 2017-11-08 NOTE — Telephone Encounter (Signed)
Called and spoke to patient.  She reports that she had a bad infection after a tooth extraction some years ago and needed Abx after the fact.  She has no current indication to prophylax with antibiotics based on my discussion with her and review of the chart.  She did not mention her ear complaint, so I will address that further with her at next visit.

## 2017-11-08 NOTE — Telephone Encounter (Signed)
Spoke with pt's son [name] per dpr. He he is aware of clearance information. Pt is having procedure tomorrow.

## 2017-11-09 NOTE — Progress Notes (Signed)
I have reviewed Dr. Julianne Rice note and agree with plan.

## 2017-11-10 DIAGNOSIS — G4733 Obstructive sleep apnea (adult) (pediatric): Secondary | ICD-10-CM | POA: Diagnosis not present

## 2017-11-12 ENCOUNTER — Encounter: Payer: Self-pay | Admitting: Pharmacist

## 2017-11-12 ENCOUNTER — Telehealth: Payer: Self-pay | Admitting: *Deleted

## 2017-11-12 DIAGNOSIS — Z7901 Long term (current) use of anticoagulants: Secondary | ICD-10-CM

## 2017-11-12 DIAGNOSIS — Z86718 Personal history of other venous thrombosis and embolism: Secondary | ICD-10-CM

## 2017-11-12 DIAGNOSIS — Z86711 Personal history of pulmonary embolism: Secondary | ICD-10-CM

## 2017-11-12 LAB — POCT INR: INR: 1.2 — AB (ref 2.0–3.0)

## 2017-11-12 NOTE — Telephone Encounter (Signed)
I agree. Will send this to Dr. Daryll Drown as well

## 2017-11-12 NOTE — Telephone Encounter (Signed)
TeraD. RN from [facility] calls because they rec'd referral from [facility] for pallative care, VO given, do you agree?

## 2017-11-13 DIAGNOSIS — D0430 Carcinoma in situ of skin of unspecified part of face: Secondary | ICD-10-CM | POA: Diagnosis not present

## 2017-11-13 DIAGNOSIS — H938X1 Other specified disorders of right ear: Secondary | ICD-10-CM | POA: Diagnosis not present

## 2017-11-13 NOTE — Progress Notes (Signed)
Anticoagulation Management Jacqueline Nguyen a 84 y.o.femalewho reports to the clinic for monitoring of warfarintreatment.  Indication: DVT and PEhistory, paroxysmal atrial fibrillation Duration: indefinite Supervising physician:Emily Mullen  Anticoagulation Clinic Visit History: Patientdoes notreport signs/symptoms of bleeding or thromboembolism and does not report any changes indiet, medications,orlifestyle.  Notably, patient does report recent extraction of 2 teeth, during which she was advised to hold warfarin. Patient re-started warfarin yesterday.  Anticoagulation Episode Summary    Current INR goal:   2.0-3.0  TTR:   67.0 % (1.3 y)  Next INR check:   11/12/2017  INR from last check:   3.1! (11/05/2017)  Most recent INR:    1.2! (11/12/2017)  Weekly max warfarin dose:     Target end date:     INR check location:     Preferred lab:     Send INR reminders to:      Indications   History of DVT (deep vein thrombosis) [Z86.718] Anticoagulated on Coumadin [Z79.01] History of pulmonary embolism [Z86.711]       Comments:         Anticoagulation Care Providers    Provider Role Specialty Phone number   [name], MD Responsible Internal Medicine [phone]     Allergies  Allergen Reactions  . Ciprofloxacin   . Bactrim Rash  . Codeine Nausea Only   Medication Sig  acetic acid-hydrocortisone (VOSOL-HC) OTIC solution Place 3 drops into the right ear 4 (four) times daily. For 7 days  albuterol (PROVENTIL HFA;VENTOLIN HFA) 108 (90 Base) MCG/ACT inhaler Inhale 1-2 puffs into the lungs daily as needed for shortness of breath or wheezing.  Ascorbic Acid (VITAMIN C PO) Take 1 capsule by mouth daily.  atenolol (TENORMIN) 25 MG tablet Take 1 tablet (25 mg total) by mouth 4 (four) times daily as needed. MAY TAKE 1 TABLET 4 TIMES DAILY AS NEEDED.  AZOPT 1 % ophthalmic suspension Place 1 drop into both eyes 2 (two) times daily.  budesonide-formoterol (SYMBICORT) 160-4.5  MCG/ACT inhaler Inhale 2 puffs into the lungs every 12 (twelve) hours.  Cholecalciferol (VITAMIN D PO) Take 1 capsule by mouth daily.  clotrimazole (LOTRIMIN) 1 % cream Apply 1 application 2 (two) times daily topically. Approximately 1 gram to the affected area.  COMBIGAN 0.2-0.5 % ophthalmic solution Place 1 drop into both eyes 2 (two) times daily.  cyanocobalamin 1000 MCG tablet Take 1,000 mcg by mouth daily.  diltiazem (CARDIZEM CD) 120 MG 24 hr capsule TAKE ONE (1) CAPSULE BY MOUTH 2 TIMES DAILY  FLOVENT HFA 44 MCG/ACT inhaler INHALE 1 PUFF TWICE A DAY DAILY  furosemide (LASIX) 20 MG tablet TAKE 1 TABLET (20 MG TOTAL) BY MOUTH DAILY.  HYDROcodone-acetaminophen (NORCO) 10-325 MG tablet Take 1 tablet by mouth every 6 (six) hours as needed for severe pain.  levalbuterol (XOPENEX) 0.63 MG/3ML nebulizer solution Take 3 mLs (0.63 mg total) by nebulization every 8 (eight) hours as needed for wheezing.  meclizine (ANTIVERT) 25 MG tablet TAKE ONE TABLET BY MOUTH 2-3 TIMES DAILY AS NEEDED FOR VERTIGO  Multiple Vitamin (MULTIVITAMIN WITH MINERALS) TABS tablet Take 1 tablet by mouth daily.  NYAMYC powder APPLY TOPICALLY FOUR TIMES DAILY  Respiratory Therapy Supplies (FLUTTER) DEVI Blow through 4 times per set, three sets daily  Turmeric (RA TURMERIC) 500 MG CAPS Take 500 mg by mouth at bedtime.  warfarin (COUMADIN) 3 MG tablet Take 1 tablet (3 mg total) by mouth daily.   Past Medical History:  Diagnosis Date  . Arthritis   . Cataract   .  Chronic respiratory failure ([place])   . Endometriosis   . HTN (hypertension)   . Hx of echocardiogram    Echo (8/15):  Mild LVH, EF 65%, no RWMA, Ao sclerosis, no AS, mod MAC, mild LAE, normal RVSF, PASP 40 mmHg  . Hx pulmonary embolism    on chronic coumadin  . Obesities, morbid ([place])   . OSA (obstructive sleep apnea)   . Osteoporosis   . PAF (paroxysmal atrial fibrillation) ([place])    Event Monitor (8/15):  Frequent PACs, brief bursts of nonsustained ATach; no  AFib  . Psoriasis   . Skin cancer   . Vertigo    Social History   Socioeconomic History  . Marital status: Widowed    Spouse name: Not on file  . Number of children: Not on file  . Years of education: Not on file  . Highest education level: Not on file  Occupational History  . Not on file  Social Needs  . Financial resource strain: Not on file  . Food insecurity:    Worry: Not on file    Inability: Not on file  . Transportation needs:    Medical: Not on file    Non-medical: Not on file  Tobacco Use  . Smoking status: Former Smoker    Packs/day: 1.00    Years: 2.00    Pack years: 2.00    Types: Cigarettes    Last attempt to quit: 04/03/1981    Years since quitting: 36.6  . Smokeless tobacco: Never Used  Substance and Sexual Activity  . Alcohol use: No  . Drug use: No  . Sexual activity: Not Currently  Lifestyle  . Physical activity:    Days per week: Not on file    Minutes per session: Not on file  . Stress: Not on file  Relationships  . Social connections:    Talks on phone: Not on file    Gets together: Not on file    Attends religious service: Not on file    Active member of club or organization: Not on file    Attends meetings of clubs or organizations: Not on file    Relationship status: Not on file  Other Topics Concern  . Not on file  Social History Narrative   She lives with her sister in [place] and requires assistance at home. Has an aid who visits and helps at home often. Son works as a Catering manager and also visits and helps when he can. Semi-independent in ADLs, requires walker around home, dependent in IADLs.   Family History  Problem Relation Age of Onset  . Heart attack Father   . Heart attack Brother   . Stroke Neg Hx    ASSESSMENT Recent Results: Lab Results  Component Value Date   INR 1.2 (A) 11/12/2017   INR 3.1 (A) 11/05/2017   INR 3.1 (A) 10/29/2017   Anticoagulation Dosing:    INR today: Subtherapeutic  PLAN Weekly  dose was unchanged due to warfarin being held for procedure, advised patient to re-check INR in 3 days.  There are no Patient Instructions on file for this visit. Patient advised to contact clinic or seek medical attention if signs/symptoms of bleeding or thromboembolism occur.  Patient verbalized understanding by repeating back information and was advised to contact me if further medication-related questions arise. Patient was also provided an information handout.  Follow-up 3 days  [name]

## 2017-11-14 LAB — PROTIME-INR

## 2017-11-19 ENCOUNTER — Encounter: Payer: Self-pay | Admitting: Pharmacist

## 2017-11-19 DIAGNOSIS — Z7901 Long term (current) use of anticoagulants: Secondary | ICD-10-CM | POA: Diagnosis not present

## 2017-11-19 DIAGNOSIS — Z86711 Personal history of pulmonary embolism: Secondary | ICD-10-CM

## 2017-11-19 DIAGNOSIS — Z86718 Personal history of other venous thrombosis and embolism: Secondary | ICD-10-CM

## 2017-11-19 DIAGNOSIS — I480 Paroxysmal atrial fibrillation: Secondary | ICD-10-CM | POA: Diagnosis not present

## 2017-11-19 LAB — POCT INR: INR: 3.3 — AB (ref 2.0–3.0)

## 2017-11-20 ENCOUNTER — Telehealth: Payer: Self-pay | Admitting: *Deleted

## 2017-11-20 NOTE — Telephone Encounter (Signed)
rec'd faxed and a call for INR done 8/19 INR  3.3 done 8/19 at 6:31 pm Sending to dr Maudie Mercury, dr groce and attending Placed faxed copy on dr [employer] desk

## 2017-11-20 NOTE — Telephone Encounter (Signed)
Noted. Thanks.

## 2017-11-21 ENCOUNTER — Other Ambulatory Visit: Payer: Self-pay | Admitting: *Deleted

## 2017-11-21 NOTE — Progress Notes (Signed)
I reviewed Dr. Julianne Rice note and agree with plan.

## 2017-11-21 NOTE — Progress Notes (Signed)
I have reviewed Dr. Julianne Rice note and agree with plan.

## 2017-11-21 NOTE — Telephone Encounter (Signed)
Patient called in requesting refill on hydrocodone. Confirmed with [facility] that patient has Rx for 11/30/2017 and 12/30/2017. They will automatically fill this for patient on those dates. Patient notified and is very Patent attorney. Hubbard Hartshorn, RN, BSN

## 2017-11-21 NOTE — Progress Notes (Signed)
Anticoagulation Management Jacqueline Nguyen a 84 y.o.femalewho reports to the clinic for monitoring of warfarintreatment.  Indication: DVT and PEhistory, paroxysmal atrial fibrillation Duration: indefinite Supervising physician:Emily Mullen  Anticoagulation Clinic Visit History: Patientdoes notreport signs/symptoms of bleeding or thromboembolism and does not report any changes indiet, medications,orlifestyle.  Notably, patient is not adhering to the regimen of 1/2 tablet (1.5 mg) once weekly, 1 tablet (3 mg) daily all other days. Instead, she is taking 1 tablet daily  Anticoagulation Episode Summary    Current INR goal:   2.0-3.0  TTR:   66.7 % (1.3 y)  Next INR check:   11/26/2017  INR from last check:   3.3! (11/19/2017)  Weekly max warfarin dose:     Target end date:     INR check location:     Preferred lab:     Send INR reminders to:      Indications   History of DVT (deep vein thrombosis) [Z86.718] Anticoagulated on Coumadin [Z79.01] History of pulmonary embolism [Z86.711]       Comments:         Anticoagulation Care Providers    Provider Role Specialty Phone number   [name], MD Responsible Internal Medicine [phone]      Allergies  Allergen Reactions  . Ciprofloxacin   . Bactrim Rash  . Codeine Nausea Only   Medication Sig  acetic acid-hydrocortisone (VOSOL-HC) OTIC solution Place 3 drops into the right ear 4 (four) times daily. For 7 days  albuterol (PROVENTIL HFA;VENTOLIN HFA) 108 (90 Base) MCG/ACT inhaler Inhale 1-2 puffs into the lungs daily as needed for shortness of breath or wheezing.  Ascorbic Acid (VITAMIN C PO) Take 1 capsule by mouth daily.  atenolol (TENORMIN) 25 MG tablet Take 1 tablet (25 mg total) by mouth 4 (four) times daily as needed. MAY TAKE 1 TABLET 4 TIMES DAILY AS NEEDED.  AZOPT 1 % ophthalmic suspension Place 1 drop into both eyes 2 (two) times daily.  budesonide-formoterol (SYMBICORT) 160-4.5 MCG/ACT inhaler  Inhale 2 puffs into the lungs every 12 (twelve) hours.  Cholecalciferol (VITAMIN D PO) Take 1 capsule by mouth daily.  clotrimazole (LOTRIMIN) 1 % cream Apply 1 application 2 (two) times daily topically. Approximately 1 gram to the affected area.  COMBIGAN 0.2-0.5 % ophthalmic solution Place 1 drop into both eyes 2 (two) times daily.  cyanocobalamin 1000 MCG tablet Take 1,000 mcg by mouth daily.  diltiazem (CARDIZEM CD) 120 MG 24 hr capsule TAKE ONE (1) CAPSULE BY MOUTH 2 TIMES DAILY  FLOVENT HFA 44 MCG/ACT inhaler INHALE 1 PUFF TWICE A DAY DAILY  furosemide (LASIX) 20 MG tablet TAKE 1 TABLET (20 MG TOTAL) BY MOUTH DAILY.  HYDROcodone-acetaminophen (NORCO) 10-325 MG tablet Take 1 tablet by mouth every 6 (six) hours as needed for severe pain.  levalbuterol (XOPENEX) 0.63 MG/3ML nebulizer solution Take 3 mLs (0.63 mg total) by nebulization every 8 (eight) hours as needed for wheezing.  meclizine (ANTIVERT) 25 MG tablet TAKE ONE TABLET BY MOUTH 2-3 TIMES DAILY AS NEEDED FOR VERTIGO  Multiple Vitamin (MULTIVITAMIN WITH MINERALS) TABS tablet Take 1 tablet by mouth daily.  NYAMYC powder APPLY TOPICALLY FOUR TIMES DAILY  Respiratory Therapy Supplies (FLUTTER) DEVI Blow through 4 times per set, three sets daily  Turmeric (RA TURMERIC) 500 MG CAPS Take 500 mg by mouth at bedtime.  warfarin (COUMADIN) 3 MG tablet Take 1 tablet (3 mg total) by mouth daily.   Past Medical History:  Diagnosis Date  . Arthritis   .  Cataract   . Chronic respiratory failure ([place])   . Endometriosis   . HTN (hypertension)   . Hx of echocardiogram    Echo (8/15):  Mild LVH, EF 65%, no RWMA, Ao sclerosis, no AS, mod MAC, mild LAE, normal RVSF, PASP 40 mmHg  . Hx pulmonary embolism    on chronic coumadin  . Obesities, morbid ([place])   . OSA (obstructive sleep apnea)   . Osteoporosis   . PAF (paroxysmal atrial fibrillation) ([place])    Event Monitor (8/15):  Frequent PACs, brief bursts of nonsustained ATach; no AFib  .  Psoriasis   . Skin cancer   . Vertigo    Social History   Socioeconomic History  . Marital status: Widowed    Spouse name: Not on file  . Number of children: Not on file  . Years of education: Not on file  . Highest education level: Not on file  Occupational History  . Not on file  Social Needs  . Financial resource strain: Not on file  . Food insecurity:    Worry: Not on file    Inability: Not on file  . Transportation needs:    Medical: Not on file    Non-medical: Not on file  Tobacco Use  . Smoking status: Former Smoker    Packs/day: 1.00    Years: 2.00    Pack years: 2.00    Types: Cigarettes    Last attempt to quit: 04/03/1981    Years since quitting: 36.6  . Smokeless tobacco: Never Used  Substance and Sexual Activity  . Alcohol use: No  . Drug use: No  . Sexual activity: Not Currently  Lifestyle  . Physical activity:    Days per week: Not on file    Minutes per session: Not on file  . Stress: Not on file  Relationships  . Social connections:    Talks on phone: Not on file    Gets together: Not on file    Attends religious service: Not on file    Active member of club or organization: Not on file    Attends meetings of clubs or organizations: Not on file    Relationship status: Not on file  Other Topics Concern  . Not on file  Social History Narrative   She lives with her sister in [place] and requires assistance at home. Has an aid who visits and helps at home often. Son works as a Catering manager and also visits and helps when he can. Semi-independent in ADLs, requires walker around home, dependent in IADLs.   Family History  Problem Relation Age of Onset  . Heart attack Father   . Heart attack Brother   . Stroke Neg Hx    ASSESSMENT Recent Results: Lab Results  Component Value Date   INR 3.3 (A) 11/19/2017   INR 1.2 (A) 11/12/2017   INR 3.1 (A) 11/05/2017   Anticoagulation Dosing: 3 mg daily (patient-reported)   INR today:  Supratherapeutic  PLAN Weekly dose was unchanged, provided adherence education and emphasized patient should take 1/2 tablet 1 day per week. She repeated back correct dosing.  There are no Patient Instructions on file for this visit. Patient advised to contact clinic or seek medical attention if signs/symptoms of bleeding or thromboembolism occur.  Patient verbalized understanding by repeating back information and was advised to contact me if further medication-related questions arise. Patient was also provided an information handout.  Follow-up Return in about 1 week (around 11/26/2017).  [name]

## 2017-11-21 NOTE — Telephone Encounter (Signed)
Agree. Thanks

## 2017-11-23 DIAGNOSIS — R609 Edema, unspecified: Secondary | ICD-10-CM | POA: Diagnosis not present

## 2017-11-23 DIAGNOSIS — M79671 Pain in right foot: Secondary | ICD-10-CM | POA: Diagnosis not present

## 2017-11-23 DIAGNOSIS — I739 Peripheral vascular disease, unspecified: Secondary | ICD-10-CM | POA: Diagnosis not present

## 2017-11-23 LAB — PROTIME-INR

## 2017-11-26 LAB — POCT INR: INR: 3.5 — AB (ref 2.0–3.0)

## 2017-11-27 ENCOUNTER — Encounter: Payer: Self-pay | Admitting: Pharmacist

## 2017-11-27 ENCOUNTER — Telehealth: Payer: Self-pay | Admitting: *Deleted

## 2017-11-27 DIAGNOSIS — Z86718 Personal history of other venous thrombosis and embolism: Secondary | ICD-10-CM

## 2017-11-27 DIAGNOSIS — Z86711 Personal history of pulmonary embolism: Secondary | ICD-10-CM

## 2017-11-27 DIAGNOSIS — Z7901 Long term (current) use of anticoagulants: Secondary | ICD-10-CM

## 2017-11-27 DIAGNOSIS — G4733 Obstructive sleep apnea (adult) (pediatric): Secondary | ICD-10-CM | POA: Diagnosis not present

## 2017-11-27 NOTE — Telephone Encounter (Signed)
Received call from MD INR with out of range INR of 3.5 taken yesterday. Hubbard Hartshorn, RN, BSN

## 2017-11-27 NOTE — Telephone Encounter (Signed)
Thank you!

## 2017-11-27 NOTE — Progress Notes (Addendum)
Anticoagulation Management Jacqueline Nguyen a 84 y.o.femalewho was contacted for warfarin monitoring. I have been concerned with patient's medication adherence and attempted to contact her son for further assistance, but was unable to reach (I left a message for son to call back).  Indication: DVT and PEhistory, paroxysmal atrial fibrillation Duration: indefinite Supervising physician:Emily Mullen  Anticoagulation Clinic Visit History: Patientdoes notreport signs/symptoms of bleeding or thromboembolism and does not report any changes indiet, medications,orlifestyle.  Notably, patient reports skipping 1 day of warfarin. Otherwise, she takes 1 tablet daily (3 mg), except for 1/2 tablet (1.5 mg) on Mondays.  Anticoagulation Episode Summary    Current INR goal:   2.0-3.0  TTR:   65.6 % (1.3 y)  Next INR check:   11/26/2017  INR from last check:   3.3! (11/19/2017)  Most recent INR:    3.5! (11/27/2017)  Weekly max warfarin dose:     Target end date:     INR check location:     Preferred lab:     Send INR reminders to:      Indications   History of DVT (deep vein thrombosis) [Z86.718] Anticoagulated on Coumadin [Z79.01] History of pulmonary embolism [Z86.711]       Comments:         Anticoagulation Care Providers    Provider Role Specialty Phone number   [name], MD Responsible Internal Medicine [phone]     Allergies  Allergen Reactions  . Ciprofloxacin   . Bactrim Rash  . Codeine Nausea Only   Medication Sig  acetic acid-hydrocortisone (VOSOL-HC) OTIC solution Place 3 drops into the right ear 4 (four) times daily. For 7 days  albuterol (PROVENTIL HFA;VENTOLIN HFA) 108 (90 Base) MCG/ACT inhaler Inhale 1-2 puffs into the lungs daily as needed for shortness of breath or wheezing.  Ascorbic Acid (VITAMIN C PO) Take 1 capsule by mouth daily.  atenolol (TENORMIN) 25 MG tablet Take 1 tablet (25 mg total) by mouth 4 (four) times daily as needed. MAY TAKE 1  TABLET 4 TIMES DAILY AS NEEDED.  AZOPT 1 % ophthalmic suspension Place 1 drop into both eyes 2 (two) times daily.  budesonide-formoterol (SYMBICORT) 160-4.5 MCG/ACT inhaler Inhale 2 puffs into the lungs every 12 (twelve) hours.  Cholecalciferol (VITAMIN D PO) Take 1 capsule by mouth daily.  clotrimazole (LOTRIMIN) 1 % cream Apply 1 application 2 (two) times daily topically. Approximately 1 gram to the affected area.  COMBIGAN 0.2-0.5 % ophthalmic solution Place 1 drop into both eyes 2 (two) times daily.  cyanocobalamin 1000 MCG tablet Take 1,000 mcg by mouth daily.  diltiazem (CARDIZEM CD) 120 MG 24 hr capsule TAKE ONE (1) CAPSULE BY MOUTH 2 TIMES DAILY  FLOVENT HFA 44 MCG/ACT inhaler INHALE 1 PUFF TWICE A DAY DAILY  furosemide (LASIX) 20 MG tablet TAKE 1 TABLET (20 MG TOTAL) BY MOUTH DAILY.  HYDROcodone-acetaminophen (NORCO) 10-325 MG tablet Take 1 tablet by mouth every 6 (six) hours as needed for severe pain.  levalbuterol (XOPENEX) 0.63 MG/3ML nebulizer solution Take 3 mLs (0.63 mg total) by nebulization every 8 (eight) hours as needed for wheezing.  meclizine (ANTIVERT) 25 MG tablet TAKE ONE TABLET BY MOUTH 2-3 TIMES DAILY AS NEEDED FOR VERTIGO  Multiple Vitamin (MULTIVITAMIN WITH MINERALS) TABS tablet Take 1 tablet by mouth daily.  NYAMYC powder APPLY TOPICALLY FOUR TIMES DAILY  Respiratory Therapy Supplies (FLUTTER) DEVI Blow through 4 times per set, three sets daily  Turmeric (RA TURMERIC) 500 MG CAPS Take 500 mg by mouth at  bedtime.  warfarin (COUMADIN) 3 MG tablet Take 1 tablet (3 mg total) by mouth daily.   Past Medical History:  Diagnosis Date  . Arthritis   . Cataract   . Chronic respiratory failure ([place])   . Endometriosis   . HTN (hypertension)   . Hx of echocardiogram    Echo (8/15):  Mild LVH, EF 65%, no RWMA, Ao sclerosis, no AS, mod MAC, mild LAE, normal RVSF, PASP 40 mmHg  . Hx pulmonary embolism    on chronic coumadin  . Obesities, morbid ([place])   . OSA (obstructive  sleep apnea)   . Osteoporosis   . PAF (paroxysmal atrial fibrillation) ([place])    Event Monitor (8/15):  Frequent PACs, brief bursts of nonsustained ATach; no AFib  . Psoriasis   . Skin cancer   . Vertigo    Social History   Socioeconomic History  . Marital status: Widowed    Spouse name: Not on file  . Number of children: Not on file  . Years of education: Not on file  . Highest education level: Not on file  Occupational History  . Not on file  Social Needs  . Financial resource strain: Not on file  . Food insecurity:    Worry: Not on file    Inability: Not on file  . Transportation needs:    Medical: Not on file    Non-medical: Not on file  Tobacco Use  . Smoking status: Former Smoker    Packs/day: 1.00    Years: 2.00    Pack years: 2.00    Types: Cigarettes    Last attempt to quit: 04/03/1981    Years since quitting: 36.6  . Smokeless tobacco: Never Used  Substance and Sexual Activity  . Alcohol use: No  . Drug use: No  . Sexual activity: Not Currently  Lifestyle  . Physical activity:    Days per week: Not on file    Minutes per session: Not on file  . Stress: Not on file  Relationships  . Social connections:    Talks on phone: Not on file    Gets together: Not on file    Attends religious service: Not on file    Active member of club or organization: Not on file    Attends meetings of clubs or organizations: Not on file    Relationship status: Not on file  Other Topics Concern  . Not on file  Social History Narrative   She lives with her sister in [place] and requires assistance at home. Has an aid who visits and helps at home often. Son works as a Catering manager and also visits and helps when he can. Semi-independent in ADLs, requires walker around home, dependent in IADLs.   Family History  Problem Relation Age of Onset  . Heart attack Father   . Heart attack Brother   . Stroke Neg Hx    ASSESSMENT Recent Results: Lab Results  Component Value  Date   INR 3.5 (A) 11/27/2017   INR 3.3 (A) 11/19/2017   INR 1.2 (A) 11/12/2017    INR today: Supratherapeutic  PLAN Weekly dose was decreased to 3 mg (1 tablet) daily, except take 1.5 mg (1/2 tablet) on Mondays, Wednesdays, and Fridays. Advised patient to write down new dosing instructions, she states she wrote down the information and read it back/repeated dosing to me. I was able to get in touch with Patient's Son, Jacqueline Nguyen, and shared information with him as well. He repeated  back the correct dosing information.  There are no Patient Instructions on file for this visit. Patient advised to contact clinic or seek medical attention if signs/symptoms of bleeding or thromboembolism occur.  Patient verbalized understanding by repeating back information and was advised to contact me if further medication-related questions arise. Patient was also provided an information handout.  Follow-up 1 week  [name]

## 2017-11-27 NOTE — Telephone Encounter (Signed)
Received fax from [facility]/INR [facility] with results of INR from 07/16/2017 - 11/19/2017. Placed in Dr. Julianne Rice box. Hubbard Hartshorn, RN, BSN

## 2017-12-03 ENCOUNTER — Encounter: Payer: Self-pay | Admitting: Pharmacist

## 2017-12-03 DIAGNOSIS — Z86711 Personal history of pulmonary embolism: Secondary | ICD-10-CM

## 2017-12-03 DIAGNOSIS — Z7901 Long term (current) use of anticoagulants: Secondary | ICD-10-CM

## 2017-12-03 DIAGNOSIS — Z86718 Personal history of other venous thrombosis and embolism: Secondary | ICD-10-CM

## 2017-12-03 LAB — POCT INR: INR: 2.4 (ref 2.0–3.0)

## 2017-12-04 NOTE — Progress Notes (Signed)
Anticoagulation Management Jacqueline Nguyen a 84 y.o.femalewho was contacted for warfarin monitoring.  Indication: DVT and PEhistory, paroxysmal atrial fibrillation Duration: indefinite Supervising physician:Emily Mullen  Anticoagulation Clinic Visit History: Patientdoes notreport signs/symptoms of bleeding or thromboembolism and does not report any changes indiet, medications,orlifestyle.  Anticoagulation Episode Summary    Current INR goal:   2.0-3.0  TTR:   65.6 % (1.3 y)  Next INR check:   12/10/2017  INR from last check:   2.4 (12/03/2017)  Weekly max warfarin dose:     Target end date:     INR check location:     Preferred lab:     Send INR reminders to:      Indications   History of DVT (deep vein thrombosis) [Z86.718] Anticoagulated on Coumadin [Z79.01] History of pulmonary embolism [Z86.711]       Comments:         Anticoagulation Care Providers    Provider Role Specialty Phone number   [name], MD Responsible Internal Medicine [phone]     Allergies  Allergen Reactions  . Ciprofloxacin   . Bactrim Rash  . Codeine Nausea Only   Medication Sig  acetic acid-hydrocortisone (VOSOL-HC) OTIC solution Place 3 drops into the right ear 4 (four) times daily. For 7 days  albuterol (PROVENTIL HFA;VENTOLIN HFA) 108 (90 Base) MCG/ACT inhaler Inhale 1-2 puffs into the lungs daily as needed for shortness of breath or wheezing.  Ascorbic Acid (VITAMIN C PO) Take 1 capsule by mouth daily.  atenolol (TENORMIN) 25 MG tablet Take 1 tablet (25 mg total) by mouth 4 (four) times daily as needed. MAY TAKE 1 TABLET 4 TIMES DAILY AS NEEDED.  AZOPT 1 % ophthalmic suspension Place 1 drop into both eyes 2 (two) times daily.  budesonide-formoterol (SYMBICORT) 160-4.5 MCG/ACT inhaler Inhale 2 puffs into the lungs every 12 (twelve) hours.  Cholecalciferol (VITAMIN D PO) Take 1 capsule by mouth daily.  clotrimazole (LOTRIMIN) 1 % cream Apply 1 application 2 (two) times  daily topically. Approximately 1 gram to the affected area.  COMBIGAN 0.2-0.5 % ophthalmic solution Place 1 drop into both eyes 2 (two) times daily.  cyanocobalamin 1000 MCG tablet Take 1,000 mcg by mouth daily.  diltiazem (CARDIZEM CD) 120 MG 24 hr capsule TAKE ONE (1) CAPSULE BY MOUTH 2 TIMES DAILY  FLOVENT HFA 44 MCG/ACT inhaler INHALE 1 PUFF TWICE A DAY DAILY  furosemide (LASIX) 20 MG tablet TAKE 1 TABLET (20 MG TOTAL) BY MOUTH DAILY.  HYDROcodone-acetaminophen (NORCO) 10-325 MG tablet Take 1 tablet by mouth every 6 (six) hours as needed for severe pain.  levalbuterol (XOPENEX) 0.63 MG/3ML nebulizer solution Take 3 mLs (0.63 mg total) by nebulization every 8 (eight) hours as needed for wheezing.  meclizine (ANTIVERT) 25 MG tablet TAKE ONE TABLET BY MOUTH 2-3 TIMES DAILY AS NEEDED FOR VERTIGO  Multiple Vitamin (MULTIVITAMIN WITH MINERALS) TABS tablet Take 1 tablet by mouth daily.  NYAMYC powder APPLY TOPICALLY FOUR TIMES DAILY  Respiratory Therapy Supplies (FLUTTER) DEVI Blow through 4 times per set, three sets daily  Turmeric (RA TURMERIC) 500 MG CAPS Take 500 mg by mouth at bedtime.  warfarin (COUMADIN) 3 MG tablet Take 1 tablet (3 mg total) by mouth daily.   Past Medical History:  Diagnosis Date  . Arthritis   . Cataract   . Chronic respiratory failure ([place])   . Endometriosis   . HTN (hypertension)   . Hx of echocardiogram    Echo (8/15):  Mild LVH, EF 65%, no RWMA,  Ao sclerosis, no AS, mod MAC, mild LAE, normal RVSF, PASP 40 mmHg  . Hx pulmonary embolism    on chronic coumadin  . Obesities, morbid ([place])   . OSA (obstructive sleep apnea)   . Osteoporosis   . PAF (paroxysmal atrial fibrillation) ([place])    Event Monitor (8/15):  Frequent PACs, brief bursts of nonsustained ATach; no AFib  . Psoriasis   . Skin cancer   . Vertigo    Social History   Socioeconomic History  . Marital status: Widowed    Spouse name: Not on file  . Number of children: Not on file  . Years of  education: Not on file  . Highest education level: Not on file  Occupational History  . Not on file  Social Needs  . Financial resource strain: Not on file  . Food insecurity:    Worry: Not on file    Inability: Not on file  . Transportation needs:    Medical: Not on file    Non-medical: Not on file  Tobacco Use  . Smoking status: Former Smoker    Packs/day: 1.00    Years: 2.00    Pack years: 2.00    Types: Cigarettes    Last attempt to quit: 04/03/1981    Years since quitting: 36.6  . Smokeless tobacco: Never Used  Substance and Sexual Activity  . Alcohol use: No  . Drug use: No  . Sexual activity: Not Currently  Lifestyle  . Physical activity:    Days per week: Not on file    Minutes per session: Not on file  . Stress: Not on file  Relationships  . Social connections:    Talks on phone: Not on file    Gets together: Not on file    Attends religious service: Not on file    Active member of club or organization: Not on file    Attends meetings of clubs or organizations: Not on file    Relationship status: Not on file  Other Topics Concern  . Not on file  Social History Narrative   She lives with her sister in [place] and requires assistance at home. Has an aid who visits and helps at home often. Son works as a Catering manager and also visits and helps when he can. Semi-independent in ADLs, requires walker around home, dependent in IADLs.   Family History  Problem Relation Age of Onset  . Heart attack Father   . Heart attack Brother   . Stroke Neg Hx    ASSESSMENT Recent Results: Lab Results  Component Value Date   INR 2.4 12/03/2017   INR 3.5 (A) 11/26/2017   INR 3.3 (A) 11/19/2017    INR today: Therapeutic  PLAN Weekly dose was unchanged  There are no Patient Instructions on file for this visit. Patient advised to contact clinic or seek medical attention if signs/symptoms of bleeding or thromboembolism occur.  Patient verbalized understanding by  repeating back information and was advised to contact me if further medication-related questions arise. Patient was also provided an information handout.  Follow-up Return in about 1 week (around 12/10/2017).  [name]

## 2017-12-05 DIAGNOSIS — G4733 Obstructive sleep apnea (adult) (pediatric): Secondary | ICD-10-CM | POA: Diagnosis not present

## 2017-12-05 LAB — PROTIME-INR

## 2017-12-05 NOTE — Progress Notes (Signed)
I have reviewed Dr. Julianne Rice note and agree with the treatment plan.

## 2017-12-06 DIAGNOSIS — R6889 Other general symptoms and signs: Secondary | ICD-10-CM | POA: Diagnosis not present

## 2017-12-06 DIAGNOSIS — I739 Peripheral vascular disease, unspecified: Secondary | ICD-10-CM | POA: Diagnosis not present

## 2017-12-06 DIAGNOSIS — R600 Localized edema: Secondary | ICD-10-CM | POA: Diagnosis not present

## 2017-12-11 DIAGNOSIS — G4733 Obstructive sleep apnea (adult) (pediatric): Secondary | ICD-10-CM | POA: Diagnosis not present

## 2017-12-17 DIAGNOSIS — M6281 Muscle weakness (generalized): Secondary | ICD-10-CM | POA: Diagnosis not present

## 2017-12-17 DIAGNOSIS — J441 Chronic obstructive pulmonary disease with (acute) exacerbation: Secondary | ICD-10-CM | POA: Diagnosis not present

## 2017-12-17 DIAGNOSIS — H903 Sensorineural hearing loss, bilateral: Secondary | ICD-10-CM | POA: Diagnosis not present

## 2017-12-17 DIAGNOSIS — Z7189 Other specified counseling: Secondary | ICD-10-CM | POA: Diagnosis not present

## 2017-12-17 DIAGNOSIS — I509 Heart failure, unspecified: Secondary | ICD-10-CM | POA: Diagnosis not present

## 2017-12-18 ENCOUNTER — Encounter: Payer: Self-pay | Admitting: Pharmacist

## 2017-12-18 DIAGNOSIS — Z86711 Personal history of pulmonary embolism: Secondary | ICD-10-CM

## 2017-12-18 DIAGNOSIS — Z7901 Long term (current) use of anticoagulants: Secondary | ICD-10-CM | POA: Diagnosis not present

## 2017-12-18 DIAGNOSIS — I480 Paroxysmal atrial fibrillation: Secondary | ICD-10-CM | POA: Diagnosis not present

## 2017-12-18 DIAGNOSIS — I82499 Acute embolism and thrombosis of other specified deep vein of unspecified lower extremity: Secondary | ICD-10-CM

## 2017-12-18 DIAGNOSIS — Z86718 Personal history of other venous thrombosis and embolism: Secondary | ICD-10-CM

## 2017-12-18 LAB — POCT INR: INR: 3 (ref 2.0–3.0)

## 2017-12-19 LAB — PROTIME-INR

## 2017-12-19 NOTE — Progress Notes (Addendum)
Anticoagulation Management Jacqueline Nguyen a 84 y.o.femalewhois being monitored for warfarin therapy using home INR meter  Indication: DVT and PEhistory, paroxysmal atrial fibrillation Duration: indefinite Supervising physician:Em[facility]  Anticoagulation Clinic Visit History: Patient reports no signs/symptoms of concern.  Anticoagulation Episode Summary    Current INR goal:   2.0-3.0  TTR:   66.6 % (1.4 y)  Next INR check:   12/24/2017  INR from last check:   3.0 (12/18/2017)  Weekly max warfarin dose:     Target end date:     INR check location:     Preferred lab:     Send INR reminders to:      Indications   History of DVT (deep vein thrombosis) [Z86.718] Anticoagulated on Coumadin [Z79.01] History of pulmonary embolism [Z86.711]       Comments:         Anticoagulation Care Providers    Provider Role Specialty Phone number   [name], MD Responsible Internal Medicine [phone]     Allergies  Allergen Reactions  . Ciprofloxacin   . Bactrim Rash  . Codeine Nausea Only   Medication Sig  acetic acid-hydrocortisone (VOSOL-HC) OTIC solution Place 3 drops into the right ear 4 (four) times daily. For 7 days  albuterol (PROVENTIL HFA;VENTOLIN HFA) 108 (90 Base) MCG/ACT inhaler Inhale 1-2 puffs into the lungs daily as needed for shortness of breath or wheezing.  Ascorbic Acid (VITAMIN C PO) Take 1 capsule by mouth daily.  atenolol (TENORMIN) 25 MG tablet Take 1 tablet (25 mg total) by mouth 4 (four) times daily as needed. MAY TAKE 1 TABLET 4 TIMES DAILY AS NEEDED.  AZOPT 1 % ophthalmic suspension Place 1 drop into both eyes 2 (two) times daily.  budesonide-formoterol (SYMBICORT) 160-4.5 MCG/ACT inhaler Inhale 2 puffs into the lungs every 12 (twelve) hours.  Cholecalciferol (VITAMIN D PO) Take 1 capsule by mouth daily.  clotrimazole (LOTRIMIN) 1 % cream Apply 1 application 2 (two) times daily topically. Approximately 1 gram to the affected area.  COMBIGAN  0.2-0.5 % ophthalmic solution Place 1 drop into both eyes 2 (two) times daily.  cyanocobalamin 1000 MCG tablet Take 1,000 mcg by mouth daily.  diltiazem (CARDIZEM CD) 120 MG 24 hr capsule TAKE ONE (1) CAPSULE BY MOUTH 2 TIMES DAILY  FLOVENT HFA 44 MCG/ACT inhaler INHALE 1 PUFF TWICE A DAY DAILY  furosemide (LASIX) 20 MG tablet TAKE 1 TABLET (20 MG TOTAL) BY MOUTH DAILY.  HYDROcodone-acetaminophen (NORCO) 10-325 MG tablet Take 1 tablet by mouth every 6 (six) hours as needed for severe pain.  levalbuterol (XOPENEX) 0.63 MG/3ML nebulizer solution Take 3 mLs (0.63 mg total) by nebulization every 8 (eight) hours as needed for wheezing.  meclizine (ANTIVERT) 25 MG tablet TAKE ONE TABLET BY MOUTH 2-3 TIMES DAILY AS NEEDED FOR VERTIGO  Multiple Vitamin (MULTIVITAMIN WITH MINERALS) TABS tablet Take 1 tablet by mouth daily.  NYAMYC powder APPLY TOPICALLY FOUR TIMES DAILY  Respiratory Therapy Supplies (FLUTTER) DEVI Blow through 4 times per set, three sets daily  Turmeric (RA TURMERIC) 500 MG CAPS Take 500 mg by mouth at bedtime.  warfarin (COUMADIN) 3 MG tablet Take 1 tablet (3 mg total) by mouth daily.   Past Medical History:  Diagnosis Date  . Arthritis   . Cataract   . Chronic respiratory failure ([place])   . Endometriosis   . HTN (hypertension)   . Hx of echocardiogram    Echo (8/15):  Mild LVH, EF 65%, no RWMA, Ao sclerosis, no AS, mod  MAC, mild LAE, normal RVSF, PASP 40 mmHg  . Hx pulmonary embolism    on chronic coumadin  . Obesities, morbid ([place])   . OSA (obstructive sleep apnea)   . Osteoporosis   . PAF (paroxysmal atrial fibrillation) ([place])    Event Monitor (8/15):  Frequent PACs, brief bursts of nonsustained ATach; no AFib  . Psoriasis   . Skin cancer   . Vertigo    Social History   Socioeconomic History  . Marital status: Widowed    Spouse name: Not on file  . Number of children: Not on file  . Years of education: Not on file  . Highest education level: Not on file   Occupational History  . Not on file  Social Needs  . Financial resource strain: Not on file  . Food insecurity:    Worry: Not on file    Inability: Not on file  . Transportation needs:    Medical: Not on file    Non-medical: Not on file  Tobacco Use  . Smoking status: Former Smoker    Packs/day: 1.00    Years: 2.00    Pack years: 2.00    Types: Cigarettes    Last attempt to quit: 04/03/1981    Years since quitting: 36.7  . Smokeless tobacco: Never Used  Substance and Sexual Activity  . Alcohol use: No  . Drug use: No  . Sexual activity: Not Currently  Lifestyle  . Physical activity:    Days per week: Not on file    Minutes per session: Not on file  . Stress: Not on file  Relationships  . Social connections:    Talks on phone: Not on file    Gets together: Not on file    Attends religious service: Not on file    Active member of club or organization: Not on file    Attends meetings of clubs or organizations: Not on file    Relationship status: Not on file  Other Topics Concern  . Not on file  Social History Narrative   She lives with her sister in [place] and requires assistance at home. Has an aid who visits and helps at home often. Son works as a Catering manager and also visits and helps when he can. Semi-independent in ADLs, requires walker around home, dependent in IADLs.   Family History  Problem Relation Age of Onset  . Heart attack Father   . Heart attack Brother   . Stroke Neg Hx    ASSESSMENT Recent Results: Lab Results  Component Value Date   INR 3.0 12/18/2017   INR 2.4 12/03/2017   INR 3.5 (A) 11/26/2017   INR today: Therapeutic  PLAN Weekly dose was unchanged. Patient verbalized understanding by repeat back, re-check INR 1 week.  Follow-up Return in about 6 days (around 12/24/2017).  [name]

## 2017-12-22 ENCOUNTER — Other Ambulatory Visit: Payer: Self-pay | Admitting: Internal Medicine

## 2017-12-24 ENCOUNTER — Ambulatory Visit: Payer: Medicare Other | Admitting: Cardiovascular Disease

## 2017-12-24 ENCOUNTER — Encounter: Payer: Self-pay | Admitting: Cardiovascular Disease

## 2017-12-24 VITALS — BP 140/66 | HR 66 | Ht 65.0 in

## 2017-12-24 DIAGNOSIS — G4733 Obstructive sleep apnea (adult) (pediatric): Secondary | ICD-10-CM | POA: Diagnosis not present

## 2017-12-24 DIAGNOSIS — I2721 Secondary pulmonary arterial hypertension: Secondary | ICD-10-CM

## 2017-12-24 DIAGNOSIS — I5032 Chronic diastolic (congestive) heart failure: Secondary | ICD-10-CM | POA: Diagnosis not present

## 2017-12-24 DIAGNOSIS — R002 Palpitations: Secondary | ICD-10-CM

## 2017-12-24 DIAGNOSIS — I50813 Acute on chronic right heart failure: Secondary | ICD-10-CM | POA: Diagnosis not present

## 2017-12-24 DIAGNOSIS — J9611 Chronic respiratory failure with hypoxia: Secondary | ICD-10-CM

## 2017-12-24 DIAGNOSIS — Z7901 Long term (current) use of anticoagulants: Secondary | ICD-10-CM

## 2017-12-24 DIAGNOSIS — J449 Chronic obstructive pulmonary disease, unspecified: Secondary | ICD-10-CM | POA: Diagnosis not present

## 2017-12-24 DIAGNOSIS — I10 Essential (primary) hypertension: Secondary | ICD-10-CM

## 2017-12-24 MED ORDER — POTASSIUM CHLORIDE ER 10 MEQ PO TBCR: EXTENDED_RELEASE_TABLET | ORAL | 3 refills | Status: DC

## 2017-12-24 NOTE — Progress Notes (Signed)
Cardiology Office Note    Date:  12/26/2017   ID:  [name], DOB [birth year], MRN [id number]  PCP:  [name], MD  Cardiologist:   Sanda Klein, MD   chief complaint: Right heart failure follow-up   History of Present Illness:  Jacqueline Nguyen is a 84 y.o. female who presents for right heart failure (secondary to obstructive sleep apnea, previous pulmonary embolism, obesity) and reported paroxysmal atrial fibrillation, not documented in a long time. She is accompanied today by her son, as before.She now lives with him in their house on [facility].  She has become much more socially engaged since moving there.  Her son says this is an excellent development, except that she has been indulging in alcohol a little more than before.  She generally has fewer palpitations than she did in the past and is less anxious about them.  She is only taken her atenolol on the average once a week or less than that.  (Uses it as needed for palpitations). She saw Dr. Ala Dach in [place] for a local cardiology opinion.  She did wear a monitor for him for 2 weeks, but I do not know what the results were.  It sounds like the findings were benign.  As always she has chronic NYHA functional class III exertional dyspnea and wears the oxygen most of the time with any activity.  She is mostly in a scooter as she is today.  She remains morbidly obese.  She occasionally has ankle puffiness, goes away by the next morning.  She has not had dizziness, syncope or falls but is very sedentary.  She has healed well after Mohs surgery for skin cancer on the left side of her nose.  She briefly interrupted anticoagulation for dental extractions, without incident.  She was unable to lay flat for a CT ordered last July, but her chest x-ray did not show evidence of heart failure.  Note was made of thoracic kyphosis due to progression of severe lower thoracic fracture.  In 2009 she had a "massive" bilateral pulmonary  embolism and she has long-standing systemic hypertension, morbid obesity, reactive airway disease, obstructive sleep apnea (on CPAP) and osteoporosis and degenerative arthritis. She wears oxygen at night and with activity and often uses a scooter. In 2014 she had transient right eye blindness, not long after cataract surgery. Has glaucoma. Echo in August 2015 showed normal left ventricular regional wall motion and EF 65%, no significant valvular abnormalities, estimated systolic PA pressure 40 mm Hg.  Follow-up echo in November 2017 showed little change.  She did have a pseudo-normal mitral inflow pattern consistent with elevated mean left atrial pressure.  She had developed very mild aortic stenosis with a mean gradient of 12 mmHg.  Her event monitor showed frequent PACs and brief bursts of nonsustained atrial tachycardia, but there was no confirmed atrial fibrillation.    Past Medical History:  Diagnosis Date  . Arthritis   . Cataract   . Chronic respiratory failure ([place])   . Endometriosis   . HTN (hypertension)   . Hx of echocardiogram    Echo (8/15):  Mild LVH, EF 65%, no RWMA, Ao sclerosis, no AS, mod MAC, mild LAE, normal RVSF, PASP 40 mmHg  . Hx pulmonary embolism    on chronic coumadin  . Obesities, morbid ([place])   . OSA (obstructive sleep apnea)   . Osteoporosis   . PAF (paroxysmal atrial fibrillation) ([place])    Event Monitor (8/15):  Frequent PACs,  brief bursts of nonsustained ATach; no AFib  . Psoriasis   . Skin cancer   . Vertigo     Past Surgical History:  Procedure Laterality Date  . cataract surgery    . GALLBLADDER SURGERY    . NOSE SURGERY    . OOPHORECTOMY    . SKIN SURGERY    . UVULOPALATOPHARYNGOPLASTY      Current Medications: Outpatient Medications Prior to Visit  Medication Sig Dispense Refill  . acetic acid-hydrocortisone (VOSOL-HC) OTIC solution Place 3 drops into the right ear 4 (four) times daily. For 7 days 10 mL 0  . albuterol (PROVENTIL HFA;VENTOLIN  HFA) 108 (90 Base) MCG/ACT inhaler Inhale 1-2 puffs into the lungs daily as needed for shortness of breath or wheezing. 1 Inhaler 3  . atenolol (TENORMIN) 25 MG tablet Take 1 tablet (25 mg total) by mouth 4 (four) times daily as needed. MAY TAKE 1 TABLET 4 TIMES DAILY AS NEEDED. 120 tablet 3  . AZOPT 1 % ophthalmic suspension Place 1 drop into both eyes 2 (two) times daily.    . budesonide-formoterol (SYMBICORT) 160-4.5 MCG/ACT inhaler Inhale 2 puffs into the lungs every 12 (twelve) hours. 10.2 g 6  . COMBIGAN 0.2-0.5 % ophthalmic solution Place 1 drop into both eyes 2 (two) times daily.    Marland Kitchen diltiazem (CARDIZEM CD) 120 MG 24 hr capsule TAKE ONE (1) CAPSULE BY MOUTH 2 TIMES DAILY 180 capsule 3  . FLOVENT HFA 44 MCG/ACT inhaler INHALE 1 PUFF TWICE A DAY DAILY 10.6 Inhaler 6  . furosemide (LASIX) 20 MG tablet TAKE 1 TABLET (20 MG TOTAL) BY MOUTH DAILY. 90 tablet 1  . HYDROcodone-acetaminophen (NORCO) 10-325 MG tablet Take 1 tablet by mouth every 6 (six) hours as needed for severe pain. 120 tablet 0  . levalbuterol (XOPENEX) 0.63 MG/3ML nebulizer solution Take 3 mLs (0.63 mg total) by nebulization every 8 (eight) hours as needed for wheezing. 3 mL 6  . NYAMYC powder APPLY TOPICALLY FOUR TIMES DAILY 15 g 0  . Respiratory Therapy Supplies (FLUTTER) DEVI Blow through 4 times per set, three sets daily 1 each 0  . warfarin (COUMADIN) 3 MG tablet Take 1 tablet (3 mg total) by mouth daily. 30 tablet 3  . Ascorbic Acid (VITAMIN C PO) Take 1 capsule by mouth daily.    . Cholecalciferol (VITAMIN D PO) Take 1 capsule by mouth daily.    . clotrimazole (LOTRIMIN) 1 % cream Apply 1 application 2 (two) times daily topically. Approximately 1 gram to the affected area. 28 g 1  . cyanocobalamin 1000 MCG tablet Take 1,000 mcg by mouth daily.    . meclizine (ANTIVERT) 25 MG tablet TAKE ONE TABLET BY MOUTH 2-3 TIMES DAILY AS NEEDED FOR VERTIGO 30 tablet 3  . Multiple Vitamin (MULTIVITAMIN WITH MINERALS) TABS tablet Take 1  tablet by mouth daily.    . Turmeric (RA TURMERIC) 500 MG CAPS Take 500 mg by mouth at bedtime.     No facility-administered medications prior to visit.      Allergies:   Ciprofloxacin; Bactrim; and Codeine   Social History   Socioeconomic History  . Marital status: Widowed    Spouse name: Not on file  . Number of children: Not on file  . Years of education: Not on file  . Highest education level: Not on file  Occupational History  . Not on file  Social Needs  . Financial resource strain: Not on file  . Food insecurity:    Worry:  Not on file    Inability: Not on file  . Transportation needs:    Medical: Not on file    Non-medical: Not on file  Tobacco Use  . Smoking status: Former Smoker    Packs/day: 1.00    Years: 2.00    Pack years: 2.00    Types: Cigarettes    Last attempt to quit: 04/03/1981    Years since quitting: 36.7  . Smokeless tobacco: Never Used  Substance and Sexual Activity  . Alcohol use: No  . Drug use: No  . Sexual activity: Not Currently  Lifestyle  . Physical activity:    Days per week: Not on file    Minutes per session: Not on file  . Stress: Not on file  Relationships  . Social connections:    Talks on phone: Not on file    Gets together: Not on file    Attends religious service: Not on file    Active member of club or organization: Not on file    Attends meetings of clubs or organizations: Not on file    Relationship status: Not on file  Other Topics Concern  . Not on file  Social History Narrative   She lives with her sister in [place] and requires assistance at home. Has an aid who visits and helps at home often. Son works as a Catering manager and also visits and helps when he can. Semi-independent in ADLs, requires walker around home, dependent in IADLs.     Family History:  The patient's family history includes Heart attack in her brother and father.   ROS:   Please see the history of present illness.    ROS All other systems  reviewed and are negative.   PHYSICAL EXAM:   VS:  BP 140/66 (BP Location: Left Arm, Patient Position: Sitting, Cuff Size: Large)   Pulse 66   Ht _0  (1.651 m)   BMI 43.28 kg/m     General: Alert, oriented x3, no distress, morbidly obese Head: no evidence of trauma, PERRL, EOMI, no exophtalmos or lid lag, no myxedema, no xanthelasma; normal ears, nose and oropharynx Neck: normal jugular venous pulsations and no hepatojugular reflux; brisk carotid pulses without delay and no carotid bruits Chest: clear to auscultation, no signs of consolidation by percussion or palpation, normal fremitus, symmetrical and full respiratory excursions Cardiovascular: normal position and quality of the apical impulse, regular rhythm, normal first and second heart sounds, 2/6 holosystolic murmur heard on both sides of the lower sternal border, no diastolic murmurs, rubs or gallops Abdomen: no tenderness or distention, no masses by palpation, no abnormal pulsatility or arterial bruits, normal bowel sounds, no hepatosplenomegaly Extremities: no clubbing, cyanosis; symmetrical 1+ edema of the feet and ankles; 2+ radial, ulnar and brachial pulses bilaterally; 2+ right femoral, posterior tibial and dorsalis pedis pulses; 2+ left femoral, posterior tibial and dorsalis pedis pulses; no subclavian or femoral bruits Neurological: grossly nonfocal Psych: Normal mood and affect   Wt Readings from Last 3 Encounters:  10/10/17 260 lb 1.6 oz (118 kg)  08/15/17 240 lb (108.9 kg)  06/26/17 240 lb (108.9 kg)      Studies/Labs Reviewed:   EKG:  EKG is not ordered today, but she had a tracing in Dr. Venia Minks office on July 10.  It is a normal tracing showing sinus rhythm. Recent Labs: 08/15/2017: ALT 9; BUN 8; Creatinine, Ser 0.60; Hemoglobin 14.3; Platelets 367; Potassium 4.5; Sodium 143   Lipid Panel  Component Value Date/Time   CHOL 205 (H) 08/30/2012 0550   TRIG 102 08/30/2012 0550   HDL 55 08/30/2012 0550    CHOLHDL 3.7 08/30/2012 0550   VLDL 20 08/30/2012 0550   LDLCALC 130 (H) 08/30/2012 0550     ASSESSMENT:    1. Acute on chronic right-sided heart failure ([place])   2. Chronic congestive heart failure with left ventricular diastolic dysfunction (H[facility])   3. OSA (obstructive sleep apnea)   4. PAH (pulmonary artery hypertension) ([place])   5. Chronic respiratory failure with hypoxia (H[facility])   6. Palpitations   7. Long term current use of anticoagulant   8. Essential hypertension   9. Morbid obesity ([place])      PLAN:  In order of problems listed above:  1. Right HF: Morbid obesity makes for a very difficult physical exam but she does not appear to be at least mildly volume overloaded.  She could not climb on her scale today, but according to her home scale she is 10-20 pounds heavier than she was when we last saw her in clinic.  We will increase her furosemide dose until she loses about 10 pounds from the current weight.  Sodium restriction is reinforced.  Daily weight monitoring will be very helpful.  Right heart dysfunction is multifactorial: related to previous pulmonary embolism, severe obesity, sleep apnea, chronic hypoxia.  Worsening kyphosis because of her thoracic vertebral fracture may be contributory. 2. Diastolic left HF: Her echo in 2017 did show evidence of grade 2 diastolic dysfunction with elevated filling pressures.  As far as I can tell her shortness of breath is at baseline right now. 3. OSA: Monitored by Dr. Annamaria Boots. The patient reports compliance with CPAP.   4. PAH: Echo estimation shows this to be relatively mild, around 40 mmHg, similar estimation on echo in 2014 and 2017 5. Chronic hypoxemia : On home oxygen 3 L by nasal cannula. Has a history of smoking and probably has COPD although her PFTs showed matched reduction in FVC and FEV1 under 50% of predicted. Clearly there is a component of obesity hypoventilation syndrome.  6. Palpitations: Most recent arrhythmia monitors performed  in our institution showed only PACs and PVCs; while atrial fibrillation has been reported in the past, this has not been documented in years.  We will try to get a copy of the monitor that she wore in [place] from Dr. Ocie Bob office.  We will be delighted to collaborate with him as much as possible in managing Mrs. Brindle's cardiac problems (I am glad she has a clinician closer to home who can help provide assistance in urgent situations).  She is on anticoagulation for pulmonary embolism anyway. 7. Warfarin: On anticoagulation for pulmonary embolism, possibly also atrial fibrillation.  No bleeding complications.  INR followed locally. 8. HTN: Control is fair, if not perfect 9. Morbid obesity: Unable to weigh today.  Remains morbidly obese.   Medication Adjustments/Labs and Tests Ordered: Current medicines are reviewed at length with the patient today.  Concerns regarding medicines are outlined above.  Medication changes, Labs and Tests ordered today are listed in the Patient Instructions below. Patient Instructions  Dr Sallyanne Kuster has recommended making the following medication changes: 1. INCREASE Furosemide to 40 mg until you lose 10 pounds on your home scale 2. TAKE Potassium 10 mEq ONLY WHEN YOU TAKE FUROSEMIDE 40 MG  Your physician recommends that you schedule a follow-up appointment in 12 months or you can alternate every other year with the doctor  in [place]. You will receive a reminder letter in the mail two months in advance. If you don't receive a letter, please call our office to schedule the follow-up appointment.  If you need a refill on your cardiac medications before your next appointment, please call your pharmacy.    Signed, Sanda Klein, MD  12/26/2017 6:05 PM    [address], [place], [place]  [address] Phone: [phone]; Fax: [phone]

## 2017-12-24 NOTE — Patient Instructions (Signed)
Dr Sallyanne Kuster has recommended making the following medication changes: 1. INCREASE Furosemide to 40 mg until you lose 10 pounds on your home scale 2. TAKE Potassium 10 mEq ONLY WHEN YOU TAKE FUROSEMIDE 40 MG  Your physician recommends that you schedule a follow-up appointment in 12 months or you can alternate every other year with the doctor in [place]. You will receive a reminder letter in the mail two months in advance. If you don't receive a letter, please call our office to schedule the follow-up appointment.  If you need a refill on your cardiac medications before your next appointment, please call your pharmacy.

## 2017-12-26 DIAGNOSIS — Z7901 Long term (current) use of anticoagulants: Secondary | ICD-10-CM | POA: Insufficient documentation

## 2017-12-26 DIAGNOSIS — I5032 Chronic diastolic (congestive) heart failure: Secondary | ICD-10-CM | POA: Insufficient documentation

## 2017-12-26 DIAGNOSIS — I2721 Secondary pulmonary arterial hypertension: Secondary | ICD-10-CM | POA: Insufficient documentation

## 2017-12-26 DIAGNOSIS — I50813 Acute on chronic right heart failure: Secondary | ICD-10-CM | POA: Insufficient documentation

## 2017-12-27 NOTE — Progress Notes (Signed)
I reviewed Dr. Julianne Rice note.

## 2017-12-28 DIAGNOSIS — G4733 Obstructive sleep apnea (adult) (pediatric): Secondary | ICD-10-CM | POA: Diagnosis not present

## 2018-01-10 DIAGNOSIS — G4733 Obstructive sleep apnea (adult) (pediatric): Secondary | ICD-10-CM | POA: Diagnosis not present

## 2018-01-11 ENCOUNTER — Telehealth: Payer: Self-pay | Admitting: Internal Medicine

## 2018-01-11 NOTE — Telephone Encounter (Signed)
Ms. Heal was contact to follow-up on her INR. Goal range of 2-3.  She reports an INR of 2.8 taken this week on Monday. Patient denies any signs or symptoms of bleeding or thrombosis. No changes to her diet or medications recently. Weekly dose was not changed.   Kathyrn Sheriff PharmD Student

## 2018-01-14 DIAGNOSIS — Z7901 Long term (current) use of anticoagulants: Secondary | ICD-10-CM | POA: Diagnosis not present

## 2018-01-14 DIAGNOSIS — I480 Paroxysmal atrial fibrillation: Secondary | ICD-10-CM | POA: Diagnosis not present

## 2018-01-14 LAB — POCT INR: INR: 2.2 (ref 2.0–3.0)

## 2018-01-15 ENCOUNTER — Encounter: Payer: Self-pay | Admitting: Internal Medicine

## 2018-01-16 ENCOUNTER — Encounter: Payer: Self-pay | Admitting: Internal Medicine

## 2018-01-16 ENCOUNTER — Encounter: Payer: Self-pay | Admitting: Pharmacist

## 2018-01-16 ENCOUNTER — Ambulatory Visit: Payer: Medicare Other | Admitting: Cardiovascular Disease

## 2018-01-16 ENCOUNTER — Ambulatory Visit (INDEPENDENT_AMBULATORY_CARE_PROVIDER_SITE_OTHER)
Admission: RE | Admit: 2018-01-16 | Discharge: 2018-01-16 | Disposition: A | Discharge status: Home / Self Care | Payer: Medicare Other | Source: Ambulatory Visit | Attending: Internal Medicine | Admitting: Internal Medicine

## 2018-01-16 ENCOUNTER — Ambulatory Visit: Payer: Medicare Other | Admitting: Internal Medicine

## 2018-01-16 VITALS — BP 130/78 | HR 63

## 2018-01-16 DIAGNOSIS — R911 Solitary pulmonary nodule: Secondary | ICD-10-CM | POA: Diagnosis not present

## 2018-01-16 DIAGNOSIS — J449 Chronic obstructive pulmonary disease, unspecified: Secondary | ICD-10-CM

## 2018-01-16 DIAGNOSIS — G4733 Obstructive sleep apnea (adult) (pediatric): Secondary | ICD-10-CM | POA: Diagnosis not present

## 2018-01-16 DIAGNOSIS — R918 Other nonspecific abnormal finding of lung field: Secondary | ICD-10-CM

## 2018-01-16 DIAGNOSIS — Z23 Encounter for immunization: Secondary | ICD-10-CM | POA: Diagnosis not present

## 2018-01-16 DIAGNOSIS — I2721 Secondary pulmonary arterial hypertension: Secondary | ICD-10-CM | POA: Diagnosis not present

## 2018-01-16 DIAGNOSIS — H401132 Primary open-angle glaucoma, bilateral, moderate stage: Secondary | ICD-10-CM | POA: Diagnosis not present

## 2018-01-16 DIAGNOSIS — I480 Paroxysmal atrial fibrillation: Secondary | ICD-10-CM

## 2018-01-16 DIAGNOSIS — Z86718 Personal history of other venous thrombosis and embolism: Secondary | ICD-10-CM

## 2018-01-16 DIAGNOSIS — Z86711 Personal history of pulmonary embolism: Secondary | ICD-10-CM

## 2018-01-16 DIAGNOSIS — Z7901 Long term (current) use of anticoagulants: Secondary | ICD-10-CM

## 2018-01-16 DIAGNOSIS — J9611 Chronic respiratory failure with hypoxia: Secondary | ICD-10-CM

## 2018-01-16 DIAGNOSIS — I10 Essential (primary) hypertension: Secondary | ICD-10-CM | POA: Diagnosis not present

## 2018-01-16 MED ORDER — TRIAMCINOLONE ACETONIDE 0.1 % MT PSTE: 1.0000 "application " | PASTE | Freq: Two times a day (BID) | OROMUCOSAL | 12 refills | Status: AC

## 2018-01-16 NOTE — Progress Notes (Signed)
Anticoagulation Management Jacqueline Nguyen a 84 y.o.femalewhois being monitored for warfarin therapy using home INR meter  Indication: DVT and PEhistory, paroxysmal atrial fibrillation Duration: indefinite Supervising physician:[facility]  Anticoagulation Clinic Visit History: Patient reports no signs/symptoms of concern  Anticoagulation Episode Summary    Current INR goal:   2.0-3.0  TTR:   68.3 % (1.5 y)  Next INR check:   01/21/2018  INR from last check:   2.2 (01/14/2018)  Weekly max warfarin dose:     Target end date:     INR check location:     Preferred lab:     Send INR reminders to:      Indications   History of DVT (deep vein thrombosis) [Z86.718] Anticoagulated on Coumadin [Z79.01] History of pulmonary embolism [Z86.711]       Comments:         Anticoagulation Care Providers    Provider Role Specialty Phone number   [name], MD Responsible Internal Medicine [phone]     Allergies  Allergen Reactions  . Ciprofloxacin   . Bactrim Rash  . Codeine Nausea Only   Medication Sig  acetic acid-hydrocortisone (VOSOL-HC) OTIC solution Place 3 drops into the right ear 4 (four) times daily. For 7 days  albuterol (PROVENTIL HFA;VENTOLIN HFA) 108 (90 Base) MCG/ACT inhaler Inhale 1-2 puffs into the lungs daily as needed for shortness of breath or wheezing.  atenolol (TENORMIN) 25 MG tablet Take 1 tablet (25 mg total) by mouth 4 (four) times daily as needed. MAY TAKE 1 TABLET 4 TIMES DAILY AS NEEDED.  AZOPT 1 % ophthalmic suspension Place 1 drop into both eyes 2 (two) times daily.  budesonide-formoterol (SYMBICORT) 160-4.5 MCG/ACT inhaler Inhale 2 puffs into the lungs every 12 (twelve) hours.  COMBIGAN 0.2-0.5 % ophthalmic solution Place 1 drop into both eyes 2 (two) times daily.  diltiazem (CARDIZEM CD) 120 MG 24 hr capsule TAKE ONE (1) CAPSULE BY MOUTH 2 TIMES DAILY  diltiazem (TIAZAC) 120 MG 24 hr capsule TAKE ONE Capsule BY MOUTH TWICE DAILY   FLOVENT HFA 44 MCG/ACT inhaler INHALE 1 PUFF TWICE A DAY DAILY  furosemide (LASIX) 20 MG tablet TAKE 1 TABLET (20 MG TOTAL) BY MOUTH DAILY.  HYDROcodone-acetaminophen (NORCO) 10-325 MG tablet Take 1 tablet by mouth every 6 (six) hours as needed for severe pain.  levalbuterol (XOPENEX) 0.63 MG/3ML nebulizer solution Take 3 mLs (0.63 mg total) by nebulization every 8 (eight) hours as needed for wheezing.  NYAMYC powder APPLY TOPICALLY FOUR TIMES DAILY  potassium chloride (K-DUR) 10 MEQ tablet Take 1 tablet (10 mEq total) by mouth daily as directed.  Respiratory Therapy Supplies (FLUTTER) DEVI Blow through 4 times per set, three sets daily  triamcinolone (KENALOG) 0.1 % paste Use as directed 1 application in the mouth or throat 2 (two) times daily.  warfarin (COUMADIN) 3 MG tablet Take 1 tablet (3 mg total) by mouth daily.   Past Medical History:  Diagnosis Date  . Arthritis   . Cataract   . Chronic respiratory failure ([place])   . Endometriosis   . HTN (hypertension)   . Hx of echocardiogram    Echo (8/15):  Mild LVH, EF 65%, no RWMA, Ao sclerosis, no AS, mod MAC, mild LAE, normal RVSF, PASP 40 mmHg  . Hx pulmonary embolism    on chronic coumadin  . Obesities, morbid ([place])   . OSA (obstructive sleep apnea)   . Osteoporosis   . PAF (paroxysmal atrial fibrillation) ([facility])    Event Monitor (  8/15):  Frequent PACs, brief bursts of nonsustained ATach; no AFib  . Psoriasis   . Skin cancer   . Vertigo    Social History   Socioeconomic History  . Marital status: Widowed    Spouse name: Not on file  . Number of children: Not on file  . Years of education: Not on file  . Highest education level: Not on file  Occupational History  . Not on file  Social Needs  . Financial resource strain: Not on file  . Food insecurity:    Worry: Not on file    Inability: Not on file  . Transportation needs:    Medical: Not on file    Non-medical: Not on file  Tobacco Use  . Smoking status: Former  Smoker    Packs/day: 1.00    Years: 2.00    Pack years: 2.00    Types: Cigarettes    Last attempt to quit: 04/03/1981    Years since quitting: 36.8  . Smokeless tobacco: Never Used  Substance and Sexual Activity  . Alcohol use: No  . Drug use: No  . Sexual activity: Not Currently  Lifestyle  . Physical activity:    Days per week: Not on file    Minutes per session: Not on file  . Stress: Not on file  Relationships  . Social connections:    Talks on phone: Not on file    Gets together: Not on file    Attends religious service: Not on file    Active member of club or organization: Not on file    Attends meetings of clubs or organizations: Not on file    Relationship status: Not on file  Other Topics Concern  . Not on file  Social History Narrative   She lives with her sister in [place] and requires assistance at home. Has an aid who visits and helps at home often. Son works as a Catering manager and also visits and helps when he can. Semi-independent in ADLs, requires walker around home, dependent in IADLs.   Family History  Problem Relation Age of Onset  . Heart attack Father   . Heart attack Brother   . Stroke Neg Hx    ASSESSMENT Recent Results: Lab Results  Component Value Date   INR 2.2 01/14/2018   INR 3.0 12/18/2017   INR 2.4 12/03/2017   INR today: Therapeutic  PLAN Weekly dose was unchanged  There are no Patient Instructions on file for this visit. Patient advised to contact clinic or seek medical attention if signs/symptoms of bleeding or thromboembolism occur.  Patient verbalized understanding by repeating back information and was advised to contact me if further medication-related questions arise. Patient was also provided an information handout.  Follow-up 1 week  [name]

## 2018-01-16 NOTE — Patient Instructions (Addendum)
Order- flu vax senior   Order- Schedule CT chest no contrast    Dx  Lung nodules 3 and 5 mm, stable-Pt unable to lay flat and therefore CXR 2 view has been ordered.   You can use your nebulizer machine treatment just before going for the CT scan, and wear your oxygen during the scan, so you can lie down long enough to get it done.  Be very deliberate about rinsing your mouth with water after using your Symbicort inhaler, to prevent yeast irritation in your mouth  Script sent for triamcinolone dental paste to use 2-3 times daily on the sore in your mouth  Please call if we can help

## 2018-01-16 NOTE — Progress Notes (Signed)
F former smoker followed for OSA, Chronic resp failure, complicated by PAF, PHTN/ coumadin, morbid obesity, glaucoma, hx PE 2009 Office Spirometry 11/26/13- severe restriction w FVC 1.43/ 49%, FEV1 0.95/ 45%, FEV1/FVC 0.66, FEF25-75% 0.52/33%, but poor technique with delayed exhalation so this may not be best capability. NPSG- documentation prior to 2009, not found in EMR --------------------------------------------------------------------------------  02/14/17- 84 year old female former smoker followed for OSA, Chronic respiratory failure, complicated by PAF, PTHN/Coumadin, morbid obesity, glaucoma CPAP 9/[employer] with O2 4 L         Son  Is with her today ---Pt is having breathing issues for last 3 weeks. Pt is having SOB with exertion, and pt is requesting a new cpap machine. Unable to stand for height and weight-power chair Nebs Xopenex, Symbicort 160 She says she cannot sleep without CPAP/oxygen and depends on it.  Machine is old and may not be working right. More short of breath over the last month with lifting arms overhead, sustained conversation.  Some cough with thick white sputum.  Flutter device does help. We discussed her CT results from October and reviewed image. CT chest 01/08/17- IMPRESSION: Slightly motion degraded examination. No evidence of pulmonary embolus. Cardiomegaly. Mitral valve and aortic valve calcification. Coronary artery calcification. Slightly prominent right hilar lymph nodes minimally more notable than on prior exam. Right middle lobe 6 mm nodule unchanged from 2015 (series 6, image 79). Peripheral right middle lobe ground glass opacities measuring 3 and 5 mm (series 6 image 85 and series 10 images 48 through 50) new from prior exam. Non-contrast chest CT at 3-6 months is recommended. If nodules persist and are stable at that time, consider additional non-contrast chest CT examinations at 2 and 4 years. This recommendation follows the consensus  statement: Guidelines for Management of Incidental Pulmonary Nodules Detected on CT Images: From the Fleischner Society 2017; Radiology 2017; 284:228-243. New from 2017 although possibly remote is T11 anterior wedge compression fracture involving superior endplate with 51% loss of height anteriorly and slight retropulsion posterosuperior aspect. Kyphosis at this level with slight impression upon the cord which is draped over this region. Aortic Atherosclerosis (ICD10-I70.0).  01/16/2018- 84 year old female former smoker followed for OSA, Chronic respiratory failure, Lung Nodules, complicated by PAF/ coumadin, PTHN/Coumadin, morbid obesity, glaucoma, hx PE 2009 CPAP auto 5-15/[employer] with O2 4 L         Son  Is with her today Download 100% compliance, AHI 0.4/ hr. she will not sleep without it. -----OSA and Lung nodules: Pt wears CPAP nightly and DL attached.  CT Chest needs to be scheduled-due now. Pt notes sore in roof of mouth. Pt feels like her breathing is more labored than usual.  Albuterol HFA, Symbicort 160, neb Xopenex She sleeps in a recliner.  Cough productive clear mucus.  NyQuil helps.  Increased dyspnea on exertion but gets no exercise and has gained weight.  Out of chair much.  We discussed chest CT for lung nodules but her obesity hypoventilation body habitus does not allow her to lie flat for CT scan without smothering. Complains of a sore place in the roof of her mouth CXR 10/18/2017- IMPRESSION: Chronic lung disease without acute infiltrate or effusion Thoracic kyphosis with possible progression of severe lower thoracic fracture. CT or MRI could be performed for further evaluation. MRI may be difficult due to marked kyphosis.    ROS-see HPI  + = positive Constitutional:   No-   weight loss, night sweats, fevers, chills, fatigue, lassitude. HEENT:   No-  headaches, difficulty swallowing, tooth/dental problems, sore throat,       No-  sneezing, itching, ear ache, nasal  congestion, post nasal drip,  CV:  No-   chest pain, orthopnea, PND, +swelling in lower extremities, anasarca,                                                                     dizziness, +palpitations Resp: +shortness of breath with exertion or at rest.              +   productive cough,  No non-productive cough,  No- coughing up of blood.              No-   change in color of mucus.  No- wheezing.   Skin: No-   rash or lesions. GI:  No-   heartburn, indigestion, abdominal pain, nausea, vomiting,  GU:  MS:  No-   joint pain or swelling.  . Neuro-     nothing unusual Psych:  No- change in mood or affect. No depression or anxiety.  No memory loss.  OBJ- Physical Exam   Talkative, + morbidly obese, + power scooter,   took oxygen off while with me General- Alert, Oriented, Affect-appropriate, Distress- none acute,  walker,  Skin- rash-none, lesions- none, excoriation- none Lymphadenopathy- none Head- atraumatic            Eyes- Gross vision intact, PERRLA, conjunctivae and secretions clear            Ears- Hearing, canals-normal            Nose- Clear, no-Septal dev, mucus, polyps, erosion, perforation             Throat- Mallampati II , mucosa clear-- no visible thrush or ulcer on palate , drainage- none, tonsils- atrophic Neck- flexible , trachea midline, no stridor , thyroid nl, carotid no bruit Chest - symmetrical excursion , unlabored           Heart/CV- RRR- no pacer , no murmur , no gallop  , no rub, nl s1 s2                           - JVD- none , edema+1-2, stasis changes- none, varices- none           Lung- clear to P&A/unlabored sitting, wheeze- none, cough- none , dullness-none, rub- none,                              + seems comfortable sitting on room air           Chest wall-  Abd-  Br/ Gen/ Rectal- Not done, not indicated Extrem- cyanosis- none, clubbing, none, atrophy- none, strength- nl, +elastic hose,                          +wheelchair,  Neuro- grossly intact to  observation

## 2018-01-17 LAB — PROTIME-INR

## 2018-01-17 NOTE — Progress Notes (Signed)
Spoke with pt and notified of results per Dr. Young Pt verbalized understanding and denied any questions. 

## 2018-01-19 NOTE — Assessment & Plan Note (Signed)
She says she will not be able to tolerate supine position for CT scan, even wearing oxygen. -Follow CXR.

## 2018-01-19 NOTE — Assessment & Plan Note (Signed)
Exam consistent with sinus rhythm at this visit.

## 2018-01-19 NOTE — Assessment & Plan Note (Signed)
She continues to benefit from CPAP with no break in therapy.  Download confirms excellent compliance and control.  She is comfortable with AutoPap.

## 2018-01-19 NOTE — Assessment & Plan Note (Signed)
There is a COPD component, but obesity hypoventilation syndrome is at least as important. Continue Symbicort maintenance.  Emphasis on mouth rinse.  Kenalog with Orabase for sore area she describes in roof of mouth.

## 2018-01-19 NOTE — Assessment & Plan Note (Signed)
Keeps oxygen on most of the time.  Followed by cardiology.

## 2018-01-19 NOTE — Assessment & Plan Note (Signed)
Depends on oxygen and is comfortable using it as prescribed at 3-4 L/min.

## 2018-01-21 LAB — POCT INR: INR: 2.6 (ref 2.0–3.0)

## 2018-01-22 ENCOUNTER — Encounter: Payer: Self-pay | Admitting: Pharmacist

## 2018-01-22 DIAGNOSIS — Z7901 Long term (current) use of anticoagulants: Secondary | ICD-10-CM

## 2018-01-22 DIAGNOSIS — Z86711 Personal history of pulmonary embolism: Secondary | ICD-10-CM

## 2018-01-22 DIAGNOSIS — Z86718 Personal history of other venous thrombosis and embolism: Secondary | ICD-10-CM

## 2018-01-22 NOTE — Progress Notes (Signed)
Anticoagulation Management Jacqueline Nguyen a 84 y.o.femalewhois being monitored for warfarin therapy using home INR meter  Indication: DVT and PEhistory, paroxysmal atrial fibrillation Duration: indefinite Supervising physician:[facility]  Anticoagulation Clinic Visit History: Patient reports no signs/symptoms of concern.  Anticoagulation Episode Summary    Current INR goal:   2.0-3.0  TTR:   68.7 % (1.5 y)  Next INR check:   01/28/2018  INR from last check:   2.6 (01/21/2018)  Weekly max warfarin dose:     Target end date:     INR check location:     Preferred lab:     Send INR reminders to:      Indications   History of DVT (deep vein thrombosis) [Z86.718] Anticoagulated on Coumadin [Z79.01] History of pulmonary embolism [Z86.711]       Comments:         Anticoagulation Care Providers    Provider Role Specialty Phone number   [name], MD Responsible Internal Medicine [phone]     Allergies  Allergen Reactions  . Ciprofloxacin   . Bactrim Rash  . Codeine Nausea Only   Medication Sig  acetic acid-hydrocortisone (VOSOL-HC) OTIC solution Place 3 drops into the right ear 4 (four) times daily. For 7 days  albuterol (PROVENTIL HFA;VENTOLIN HFA) 108 (90 Base) MCG/ACT inhaler Inhale 1-2 puffs into the lungs daily as needed for shortness of breath or wheezing.  atenolol (TENORMIN) 25 MG tablet Take 1 tablet (25 mg total) by mouth 4 (four) times daily as needed. MAY TAKE 1 TABLET 4 TIMES DAILY AS NEEDED.  AZOPT 1 % ophthalmic suspension Place 1 drop into both eyes 2 (two) times daily.  budesonide-formoterol (SYMBICORT) 160-4.5 MCG/ACT inhaler Inhale 2 puffs into the lungs every 12 (twelve) hours.  COMBIGAN 0.2-0.5 % ophthalmic solution Place 1 drop into both eyes 2 (two) times daily.  diltiazem (CARDIZEM CD) 120 MG 24 hr capsule TAKE ONE (1) CAPSULE BY MOUTH 2 TIMES DAILY  diltiazem (TIAZAC) 120 MG 24 hr capsule TAKE ONE Capsule BY MOUTH TWICE DAILY   FLOVENT HFA 44 MCG/ACT inhaler INHALE 1 PUFF TWICE A DAY DAILY  furosemide (LASIX) 20 MG tablet TAKE 1 TABLET (20 MG TOTAL) BY MOUTH DAILY.  HYDROcodone-acetaminophen (NORCO) 10-325 MG tablet Take 1 tablet by mouth every 6 (six) hours as needed for severe pain.  levalbuterol (XOPENEX) 0.63 MG/3ML nebulizer solution Take 3 mLs (0.63 mg total) by nebulization every 8 (eight) hours as needed for wheezing.  NYAMYC powder APPLY TOPICALLY FOUR TIMES DAILY  potassium chloride (K-DUR) 10 MEQ tablet Take 1 tablet (10 mEq total) by mouth daily as directed.  Respiratory Therapy Supplies (FLUTTER) DEVI Blow through 4 times per set, three sets daily  triamcinolone (KENALOG) 0.1 % paste Use as directed 1 application in the mouth or throat 2 (two) times daily.  warfarin (COUMADIN) 3 MG tablet Take 1 tablet (3 mg total) by mouth daily.   Past Medical History:  Diagnosis Date  . Arthritis   . Cataract   . Chronic respiratory failure ([place])   . Endometriosis   . HTN (hypertension)   . Hx of echocardiogram    Echo (8/15):  Mild LVH, EF 65%, no RWMA, Ao sclerosis, no AS, mod MAC, mild LAE, normal RVSF, PASP 40 mmHg  . Hx pulmonary embolism    on chronic coumadin  . Obesities, morbid ([place])   . OSA (obstructive sleep apnea)   . Osteoporosis   . PAF (paroxysmal atrial fibrillation) (HCC)    Event Monitor (  8/15):  Frequent PACs, brief bursts of nonsustained ATach; no AFib  . Psoriasis   . Skin cancer   . Vertigo    Social History   Socioeconomic History  . Marital status: Widowed    Spouse name: Not on file  . Number of children: Not on file  . Years of education: Not on file  . Highest education level: Not on file  Occupational History  . Not on file  Social Needs  . Financial resource strain: Not on file  . Food insecurity:    Worry: Not on file    Inability: Not on file  . Transportation needs:    Medical: Not on file    Non-medical: Not on file  Tobacco Use  . Smoking status: Former  Smoker    Packs/day: 1.00    Years: 2.00    Pack years: 2.00    Types: Cigarettes    Last attempt to quit: 04/03/1981    Years since quitting: 36.8  . Smokeless tobacco: Never Used  Substance and Sexual Activity  . Alcohol use: No  . Drug use: No  . Sexual activity: Not Currently  Lifestyle  . Physical activity:    Days per week: Not on file    Minutes per session: Not on file  . Stress: Not on file  Relationships  . Social connections:    Talks on phone: Not on file    Gets together: Not on file    Attends religious service: Not on file    Active member of club or organization: Not on file    Attends meetings of clubs or organizations: Not on file    Relationship status: Not on file  Other Topics Concern  . Not on file  Social History Narrative   She lives with her sister in [place] and requires assistance at home. Has an aid who visits and helps at home often. Son works as a Catering manager and also visits and helps when he can. Semi-independent in ADLs, requires walker around home, dependent in IADLs.   Family History  Problem Relation Age of Onset  . Heart attack Father   . Heart attack Brother   . Stroke Neg Hx    ASSESSMENT Recent Results: Lab Results  Component Value Date   INR 2.6 01/21/2018   INR 2.2 01/14/2018   INR 3.0 12/18/2017    INR today: Therapeutic  PLAN Weekly dose was unchanged   There are no Patient Instructions on file for this visit. Patient advised to contact clinic or seek medical attention if signs/symptoms of bleeding or thromboembolism occur.  Patient verbalized understanding by repeating back information and was advised to contact me if further medication-related questions arise. Patient was also provided an information handout.  Follow-up 1 week  [name]

## 2018-01-24 DIAGNOSIS — J441 Chronic obstructive pulmonary disease with (acute) exacerbation: Secondary | ICD-10-CM | POA: Diagnosis not present

## 2018-01-24 DIAGNOSIS — I509 Heart failure, unspecified: Secondary | ICD-10-CM | POA: Diagnosis not present

## 2018-01-24 DIAGNOSIS — Z515 Encounter for palliative care: Secondary | ICD-10-CM | POA: Diagnosis not present

## 2018-01-24 DIAGNOSIS — M6281 Muscle weakness (generalized): Secondary | ICD-10-CM | POA: Diagnosis not present

## 2018-01-27 DIAGNOSIS — G4733 Obstructive sleep apnea (adult) (pediatric): Secondary | ICD-10-CM | POA: Diagnosis not present

## 2018-01-28 ENCOUNTER — Encounter: Payer: Self-pay | Admitting: Pharmacist

## 2018-01-28 DIAGNOSIS — Z86718 Personal history of other venous thrombosis and embolism: Secondary | ICD-10-CM

## 2018-01-28 DIAGNOSIS — Z86711 Personal history of pulmonary embolism: Secondary | ICD-10-CM

## 2018-01-28 DIAGNOSIS — Z7901 Long term (current) use of anticoagulants: Secondary | ICD-10-CM

## 2018-01-28 LAB — POCT INR: INR: 2.6 (ref 2.0–3.0)

## 2018-01-29 LAB — PROTIME-INR

## 2018-01-30 LAB — PROTIME-INR

## 2018-01-30 NOTE — Progress Notes (Signed)
Anticoagulation Management Jacqueline Nguyen a 84 y.o.femalewhois being monitored for warfarin therapy using home INR meter  Indication: DVT and PEhistory, paroxysmal atrial fibrillation Duration: indefinite Supervising physician:Emily [facility]  Anticoagulation Clinic Visit History: Patient reports no signs/symptoms of concern.  Anticoagulation Episode Summary    Current INR goal:   2.0-3.0  TTR:   69.1 % (1.5 y)  Next INR check:   02/04/2018  INR from last check:   2.6 (01/28/2018)  Weekly max warfarin dose:     Target end date:     INR check location:     Preferred lab:     Send INR reminders to:      Indications   History of DVT (deep vein thrombosis) [Z86.718] Anticoagulated on Coumadin [Z79.01] History of pulmonary embolism [Z86.711]       Comments:         Anticoagulation Care Providers    Provider Role Specialty Phone number   [name], MD Responsible Internal Medicine [phone]     Allergies  Allergen Reactions  . Ciprofloxacin   . Bactrim Rash  . Codeine Nausea Only   Medication Sig  acetic acid-hydrocortisone (VOSOL-HC) OTIC solution Place 3 drops into the right ear 4 (four) times daily. For 7 days  albuterol (PROVENTIL HFA;VENTOLIN HFA) 108 (90 Base) MCG/ACT inhaler Inhale 1-2 puffs into the lungs daily as needed for shortness of breath or wheezing.  atenolol (TENORMIN) 25 MG tablet Take 1 tablet (25 mg total) by mouth 4 (four) times daily as needed. MAY TAKE 1 TABLET 4 TIMES DAILY AS NEEDED.  AZOPT 1 % ophthalmic suspension Place 1 drop into both eyes 2 (two) times daily.  budesonide-formoterol (SYMBICORT) 160-4.5 MCG/ACT inhaler Inhale 2 puffs into the lungs every 12 (twelve) hours.  COMBIGAN 0.2-0.5 % ophthalmic solution Place 1 drop into both eyes 2 (two) times daily.  diltiazem (CARDIZEM CD) 120 MG 24 hr capsule TAKE ONE (1) CAPSULE BY MOUTH 2 TIMES DAILY  diltiazem (TIAZAC) 120 MG 24 hr capsule TAKE ONE Capsule BY MOUTH TWICE DAILY   FLOVENT HFA 44 MCG/ACT inhaler INHALE 1 PUFF TWICE A DAY DAILY  furosemide (LASIX) 20 MG tablet TAKE 1 TABLET (20 MG TOTAL) BY MOUTH DAILY.  HYDROcodone-acetaminophen (NORCO) 10-325 MG tablet Take 1 tablet by mouth every 6 (six) hours as needed for severe pain.  levalbuterol (XOPENEX) 0.63 MG/3ML nebulizer solution Take 3 mLs (0.63 mg total) by nebulization every 8 (eight) hours as needed for wheezing.  NYAMYC powder APPLY TOPICALLY FOUR TIMES DAILY  potassium chloride (K-DUR) 10 MEQ tablet Take 1 tablet (10 mEq total) by mouth daily as directed.  Respiratory Therapy Supplies (FLUTTER) DEVI Blow through 4 times per set, three sets daily  triamcinolone (KENALOG) 0.1 % paste Use as directed 1 application in the mouth or throat 2 (two) times daily.  warfarin (COUMADIN) 3 MG tablet Take 1 tablet (3 mg total) by mouth daily.   Past Medical History:  Diagnosis Date  . Arthritis   . Cataract   . Chronic respiratory failure ([place])   . Endometriosis   . HTN (hypertension)   . Hx of echocardiogram    Echo (8/15):  Mild LVH, EF 65%, no RWMA, Ao sclerosis, no AS, mod MAC, mild LAE, normal RVSF, PASP 40 mmHg  . Hx pulmonary embolism    on chronic coumadin  . Obesities, morbid ([place])   . OSA (obstructive sleep apnea)   . Osteoporosis   . PAF (paroxysmal atrial fibrillation) (HCC)    Event Monitor (  8/15):  Frequent PACs, brief bursts of nonsustained ATach; no AFib  . Psoriasis   . Skin cancer   . Vertigo    Social History   Socioeconomic History  . Marital status: Widowed    Spouse name: Not on file  . Number of children: Not on file  . Years of education: Not on file  . Highest education level: Not on file  Occupational History  . Not on file  Social Needs  . Financial resource strain: Not on file  . Food insecurity:    Worry: Not on file    Inability: Not on file  . Transportation needs:    Medical: Not on file    Non-medical: Not on file  Tobacco Use  . Smoking status: Former  Smoker    Packs/day: 1.00    Years: 2.00    Pack years: 2.00    Types: Cigarettes    Last attempt to quit: 04/03/1981    Years since quitting: 36.8  . Smokeless tobacco: Never Used  Substance and Sexual Activity  . Alcohol use: No  . Drug use: No  . Sexual activity: Not Currently  Lifestyle  . Physical activity:    Days per week: Not on file    Minutes per session: Not on file  . Stress: Not on file  Relationships  . Social connections:    Talks on phone: Not on file    Gets together: Not on file    Attends religious service: Not on file    Active member of club or organization: Not on file    Attends meetings of clubs or organizations: Not on file    Relationship status: Not on file  Other Topics Concern  . Not on file  Social History Narrative   She lives with her sister in [place] and requires assistance at home. Has an aid who visits and helps at home often. Son works as a Catering manager and also visits and helps when he can. Semi-independent in ADLs, requires walker around home, dependent in IADLs.   Family History  Problem Relation Age of Onset  . Heart attack Father   . Heart attack Brother   . Stroke Neg Hx    ASSESSMENT Recent Results: Lab Results  Component Value Date   INR 2.6 01/28/2018   INR 2.6 01/21/2018   INR 2.2 01/14/2018    INR today: Therapeutic  PLAN Weekly dose was unchanged   There are no Patient Instructions on file for this visit. Patient advised to contact clinic or seek medical attention if signs/symptoms of bleeding or thromboembolism occur.  Patient verbalized understanding by repeating back information and was advised to contact me if further medication-related questions arise. Patient was also provided an information handout.  Follow-up 1 week  [name]

## 2018-02-04 ENCOUNTER — Encounter: Payer: Self-pay | Admitting: Pharmacist

## 2018-02-04 DIAGNOSIS — Z7901 Long term (current) use of anticoagulants: Secondary | ICD-10-CM

## 2018-02-04 DIAGNOSIS — Z86711 Personal history of pulmonary embolism: Secondary | ICD-10-CM

## 2018-02-04 DIAGNOSIS — Z86718 Personal history of other venous thrombosis and embolism: Secondary | ICD-10-CM

## 2018-02-04 LAB — POCT INR: INR: 3.3 — AB (ref 2.0–3.0)

## 2018-02-05 ENCOUNTER — Other Ambulatory Visit: Payer: Self-pay | Admitting: Internal Medicine

## 2018-02-05 DIAGNOSIS — Z86718 Personal history of other venous thrombosis and embolism: Secondary | ICD-10-CM

## 2018-02-05 DIAGNOSIS — Z7901 Long term (current) use of anticoagulants: Secondary | ICD-10-CM

## 2018-02-05 DIAGNOSIS — Z86711 Personal history of pulmonary embolism: Secondary | ICD-10-CM

## 2018-02-05 NOTE — Telephone Encounter (Signed)
Pt appt 04/10/2018 @ 10:45am.

## 2018-02-05 NOTE — Progress Notes (Signed)
Anticoagulation Management Jacqueline Nguyen a 84 y.o.femalewhois being monitored for warfarin therapy using home INR meter  Indication: DVT and PEhistory, paroxysmal atrial fibrillation Duration: indefinite Supervising physician:Emily [facility]  Anticoagulation Clinic Visit History: Patient reports no signs/symptoms of concern.  Anticoagulation Episode Summary    Current INR goal:   2.0-3.0  TTR:   68.9 % (1.5 y)  Next INR check:   02/12/2018  INR from last check:   3.3! (02/04/2018)  Weekly max warfarin dose:     Target end date:     INR check location:     Preferred lab:     Send INR reminders to:      Indications   History of DVT (deep vein thrombosis) [Z86.718] Anticoagulated on Coumadin [Z79.01] History of pulmonary embolism [Z86.711]       Comments:         Anticoagulation Care Providers    Provider Role Specialty Phone number   [name], MD Responsible Internal Medicine [phone]     Allergies  Allergen Reactions  . Ciprofloxacin   . Bactrim Rash  . Codeine Nausea Only   Medication Sig  acetic acid-hydrocortisone (VOSOL-HC) OTIC solution Place 3 drops into the right ear 4 (four) times daily. For 7 days  albuterol (PROVENTIL HFA;VENTOLIN HFA) 108 (90 Base) MCG/ACT inhaler Inhale 1-2 puffs into the lungs daily as needed for shortness of breath or wheezing.  atenolol (TENORMIN) 25 MG tablet Take 1 tablet (25 mg total) by mouth 4 (four) times daily as needed. MAY TAKE 1 TABLET 4 TIMES DAILY AS NEEDED.  AZOPT 1 % ophthalmic suspension Place 1 drop into both eyes 2 (two) times daily.  budesonide-formoterol (SYMBICORT) 160-4.5 MCG/ACT inhaler Inhale 2 puffs into the lungs every 12 (twelve) hours.  COMBIGAN 0.2-0.5 % ophthalmic solution Place 1 drop into both eyes 2 (two) times daily.  diltiazem (CARDIZEM CD) 120 MG 24 hr capsule TAKE ONE (1) CAPSULE BY MOUTH 2 TIMES DAILY  diltiazem (TIAZAC) 120 MG 24 hr capsule TAKE ONE Capsule BY MOUTH TWICE DAILY   FLOVENT HFA 44 MCG/ACT inhaler INHALE 1 PUFF TWICE A DAY DAILY  furosemide (LASIX) 20 MG tablet TAKE 1 TABLET (20 MG TOTAL) BY MOUTH DAILY.  HYDROcodone-acetaminophen (NORCO) 10-325 MG tablet Take 1 tablet by mouth every 6 (six) hours as needed for severe pain.  levalbuterol (XOPENEX) 0.63 MG/3ML nebulizer solution Take 3 mLs (0.63 mg total) by nebulization every 8 (eight) hours as needed for wheezing.  NYAMYC powder APPLY TOPICALLY FOUR TIMES DAILY  potassium chloride (K-DUR) 10 MEQ tablet Take 1 tablet (10 mEq total) by mouth daily as directed.  Respiratory Therapy Supplies (FLUTTER) DEVI Blow through 4 times per set, three sets daily  triamcinolone (KENALOG) 0.1 % paste Use as directed 1 application in the mouth or throat 2 (two) times daily.  warfarin (COUMADIN) 3 MG tablet Take 1 tablet (3 mg total) by mouth daily.   Past Medical History:  Diagnosis Date  . Arthritis   . Cataract   . Chronic respiratory failure ([place])   . Endometriosis   . HTN (hypertension)   . Hx of echocardiogram    Echo (8/15):  Mild LVH, EF 65%, no RWMA, Ao sclerosis, no AS, mod MAC, mild LAE, normal RVSF, PASP 40 mmHg  . Hx pulmonary embolism    on chronic coumadin  . Obesities, morbid ([place])   . OSA (obstructive sleep apnea)   . Osteoporosis   . PAF (paroxysmal atrial fibrillation) (H[facility])    Event Monitor (  8/15):  Frequent PACs, brief bursts of nonsustained ATach; no AFib  . Psoriasis   . Skin cancer   . Vertigo    Social History   Socioeconomic History  . Marital status: Widowed    Spouse name: Not on file  . Number of children: Not on file  . Years of education: Not on file  . Highest education level: Not on file  Occupational History  . Not on file  Social Needs  . Financial resource strain: Not on file  . Food insecurity:    Worry: Not on file    Inability: Not on file  . Transportation needs:    Medical: Not on file    Non-medical: Not on file  Tobacco Use  . Smoking status: Former  Smoker    Packs/day: 1.00    Years: 2.00    Pack years: 2.00    Types: Cigarettes    Last attempt to quit: 04/03/1981    Years since quitting: 36.8  . Smokeless tobacco: Never Used  Substance and Sexual Activity  . Alcohol use: No  . Drug use: No  . Sexual activity: Not Currently  Lifestyle  . Physical activity:    Days per week: Not on file    Minutes per session: Not on file  . Stress: Not on file  Relationships  . Social connections:    Talks on phone: Not on file    Gets together: Not on file    Attends religious service: Not on file    Active member of club or organization: Not on file    Attends meetings of clubs or organizations: Not on file    Relationship status: Not on file  Other Topics Concern  . Not on file  Social History Narrative   She lives with her sister in [place] and requires assistance at home. Has an aid who visits and helps at home often. Son works as a Catering manager and also visits and helps when he can. Semi-independent in ADLs, requires walker around home, dependent in IADLs.   Family History  Problem Relation Age of Onset  . Heart attack Father   . Heart attack Brother   . Stroke Neg Hx    ASSESSMENT Recent Results: Lab Results  Component Value Date   INR 3.3 (A) 02/04/2018   INR 2.6 01/28/2018   INR 2.6 01/21/2018    INR today: supratherapeutic  PLAN Weekly dose was unchanged since patient is only slightly supratherapeutic and being monitored closely.  There are no Patient Instructions on file for this visit.   Patient advised to contact clinic or seek medical attention if signs/symptoms of bleeding or thromboembolism occur.  Patient verbalized understanding by repeating back information and was advised to contact me if further medication-related questions arise. Patient was also provided an information handout.  Follow-up 1 week  [name]

## 2018-02-06 ENCOUNTER — Other Ambulatory Visit: Payer: Self-pay | Admitting: Internal Medicine

## 2018-02-06 LAB — PROTIME-INR

## 2018-02-06 MED ORDER — HYDROCODONE-ACETAMINOPHEN 10-325 MG PO TABS: 1.0000 | ORAL_TABLET | Freq: Four times a day (QID) | ORAL | 0 refills | Status: DC | PRN

## 2018-02-06 NOTE — Telephone Encounter (Signed)
Went in to a separate encounter and refilled X 3.  Sent electronically.  [place] CSRS is appropriate.  Thanks

## 2018-02-07 ENCOUNTER — Other Ambulatory Visit: Payer: Self-pay

## 2018-02-07 ENCOUNTER — Ambulatory Visit (INDEPENDENT_AMBULATORY_CARE_PROVIDER_SITE_OTHER): Payer: Medicare Other | Admitting: Internal Medicine

## 2018-02-07 ENCOUNTER — Encounter: Payer: Self-pay | Admitting: Internal Medicine

## 2018-02-07 VITALS — BP 157/58 | HR 56 | Temp 97.5°F

## 2018-02-07 DIAGNOSIS — M542 Cervicalgia: Secondary | ICD-10-CM

## 2018-02-07 DIAGNOSIS — Z79899 Other long term (current) drug therapy: Secondary | ICD-10-CM

## 2018-02-07 DIAGNOSIS — M546 Pain in thoracic spine: Secondary | ICD-10-CM

## 2018-02-07 DIAGNOSIS — M549 Dorsalgia, unspecified: Secondary | ICD-10-CM | POA: Diagnosis not present

## 2018-02-07 DIAGNOSIS — M79642 Pain in left hand: Secondary | ICD-10-CM

## 2018-02-07 DIAGNOSIS — M25511 Pain in right shoulder: Secondary | ICD-10-CM | POA: Diagnosis not present

## 2018-02-07 DIAGNOSIS — M25512 Pain in left shoulder: Secondary | ICD-10-CM | POA: Diagnosis not present

## 2018-02-07 DIAGNOSIS — R0602 Shortness of breath: Secondary | ICD-10-CM | POA: Insufficient documentation

## 2018-02-07 DIAGNOSIS — Z87891 Personal history of nicotine dependence: Secondary | ICD-10-CM

## 2018-02-07 DIAGNOSIS — M79641 Pain in right hand: Secondary | ICD-10-CM

## 2018-02-07 DIAGNOSIS — Z79891 Long term (current) use of opiate analgesic: Secondary | ICD-10-CM

## 2018-02-07 DIAGNOSIS — I50812 Chronic right heart failure: Secondary | ICD-10-CM | POA: Diagnosis not present

## 2018-02-07 DIAGNOSIS — G8929 Other chronic pain: Secondary | ICD-10-CM

## 2018-02-07 MED ORDER — DICLOFENAC SODIUM 1 % TD GEL: 2.0000 g | Freq: Four times a day (QID) | TRANSDERMAL | 0 refills | Status: AC | PRN

## 2018-02-07 MED ORDER — FUROSEMIDE 20 MG PO TABS: 40.0000 mg | ORAL_TABLET | Freq: Every day | ORAL | 1 refills | Status: AC

## 2018-02-07 NOTE — Progress Notes (Signed)
CC: Back pain  HPI: [name] is a 84 y.o.  with a PMH listed below presenting for back pain and increased feet swelling.  Please see A&P for status of the patient's chronic medical conditions  Past Medical History:  Diagnosis Date  . Arthritis   . Cataract   . Chronic respiratory failure ([place])   . Endometriosis   . HTN (hypertension)   . Hx of echocardiogram    Echo (8/15):  Mild LVH, EF 65%, no RWMA, Ao sclerosis, no AS, mod MAC, mild LAE, normal RVSF, PASP 40 mmHg  . Hx pulmonary embolism    on chronic coumadin  . Obesities, morbid ([place])   . OSA (obstructive sleep apnea)   . Osteoporosis   . PAF (paroxysmal atrial fibrillation) ([place])    Event Monitor (8/15):  Frequent PACs, brief bursts of nonsustained ATach; no AFib  . Psoriasis   . Skin cancer   . Vertigo    Review of Systems: Refer to history of present illness and assessment and plans for pertinent review of systems, all others reviewed and negative.  Physical Exam:  Vitals:   02/07/18 1045  BP: (!) 157/58  Pulse: (!) 56  Temp: (!) 97.5 F (36.4 C)  TempSrc: Oral  SpO2: 93%    Physical Exam  Constitutional: She is oriented to person, place, and time.  Well nourished, appeared stated age, sitting in wheelchair  HENT:  Head: Normocephalic and atraumatic.  Eyes: Pupils are equal, round, and reactive to light. Conjunctivae and EOM are normal.  Neck: Normal range of motion. Neck supple. No thyromegaly present.  Cardiovascular: Normal rate, regular rhythm and normal heart sounds. Exam reveals no gallop and no friction rub.  No murmur heard. Pulmonary/Chest: Effort normal. No respiratory distress. She has no wheezes. She has no rales.  Abdominal: Soft. Bowel sounds are normal. She exhibits no distension.  Musculoskeletal: Normal range of motion. She exhibits edema (bilateral 3+ LE edema, dry skin).  Neurological: She is alert and oriented to person, place, and time. Gait normal.  Skin: Skin is warm and  dry. No erythema.  Psychiatric: Mood and affect normal.    Social History   Socioeconomic History  . Marital status: Widowed    Spouse name: Not on file  . Number of children: Not on file  . Years of education: Not on file  . Highest education level: Not on file  Occupational History  . Not on file  Social Needs  . Financial resource strain: Not on file  . Food insecurity:    Worry: Not on file    Inability: Not on file  . Transportation needs:    Medical: Not on file    Non-medical: Not on file  Tobacco Use  . Smoking status: Former Smoker    Packs/day: 1.00    Years: 2.00    Pack years: 2.00    Types: Cigarettes    Last attempt to quit: 04/03/1981    Years since quitting: 36.8  . Smokeless tobacco: Never Used  Substance and Sexual Activity  . Alcohol use: No  . Drug use: No  . Sexual activity: Not Currently  Lifestyle  . Physical activity:    Days per week: Not on file    Minutes per session: Not on file  . Stress: Not on file  Relationships  . Social connections:    Talks on phone: Not on file    Gets together: Not on file    Attends religious service: Not  on file    Active member of club or organization: Not on file    Attends meetings of clubs or organizations: Not on file    Relationship status: Not on file  . Intimate partner violence:    Fear of current or ex partner: Not on file    Emotionally abused: Not on file    Physically abused: Not on file    Forced sexual activity: Not on file  Other Topics Concern  . Not on file  Social History Narrative   She lives with her sister in [place] and requires assistance at home. Has an aid who visits and helps at home often. Son works as a Catering manager and also visits and helps when he can. Semi-independent in ADLs, requires walker around home, dependent in IADLs.    Family History  Problem Relation Age of Onset  . Heart attack Father   . Heart attack Brother   . Stroke Neg Hx     Assessment & Plan:    See Encounters Tab for problem based charting.  Patient seen with Dr. Daryll Drown

## 2018-02-07 NOTE — Assessment & Plan Note (Addendum)
Assessment: Patient has a history of congestive heart failure.  She last saw her cardiologist last month and was told to increase her Lasix to 40 mg daily.  However there was a mix up and she is still only taking 20 mg daily. She has bilateral lower extremity swelling up to her mid shin, she reports that this is chronic.  She also reports chronic orthopnea.  She reports that she has been urinating well, no straining and feels that she empties her bladder completely.  On exam she does 3+ pitting edema to her mid shin, no crackles on exam, no JVD.   Patient was asked to weigh herself daily however given her deconditioning she reports that she is unable to stand on scale.    Plan: -Increase Lasix to 40 mg daily per cardiology note, take potassium pills -Encouraged patient to decrease salt intake -Advised patient to elevate her feet at night and to continue to wear compression stockings -Encouraged patient to monitor her weights

## 2018-02-07 NOTE — Assessment & Plan Note (Signed)
Assessment: Patient reports upper back pain for 2 weeks started when she woke up one morning. She states that is located between the scapulas.  She states that it is aching pain that is worse when she is moving her shoulders.  She has been applying Arnicare gel to the area which she states helps. She denies any recent trauma, infection, or precipitating factors.  Denies any numbness, tingling, weakness in her upper extremities.  Denies any nausea, vomiting, chills, or other signs of infection.  She reports that she sleeps in a chair upright and thinks that the way she sleeps may have worsened symptoms.  Does have a history of chronic pain in her neck, shoulders, joints and hands and she using Norco for this.  She reports that Norco helps with her back pain.  On exam she does have tenderness over her bilateral trapezius muscles and upper thoracic spine, she does have a 4+ out of 5 strength in her bilateral upper extremities which appears to be limited due to pain and her sensation to light touch was intact.  This appears to be more of a musculoskeletal syndrome however given her age and there is a risk of a vertebral compression fracture.  We will treat her symptomatically for now and if her pain continues and obtain an x-ray.   Plan: -Voltaren gel -Advised patient take she Tylenol regular strength 2 tablets twice a day for 1 week -Follow-up in 1-2 weeks to reassess pain, if still present we can consider obtaining an X-ray

## 2018-02-07 NOTE — Patient Instructions (Addendum)
Thank you for allowing [place] to provide your care today. Today we discussed your back pain and heart failure    Today we made some changes to your medications.   Please start using voltaren gel on your back Please start using tylenol regular strength 2 tablets twice a day for 1 week Increase your lasix to 40 mg daily Continue to monitor your salt intake and consume a low sodium diet Please make sure to elevate your feet at night  Please follow-up in 1-2 weeks to evaluate your pain.    Should you have any questions or concerns please call the internal medicine clinic at [phone].

## 2018-02-08 NOTE — Progress Notes (Signed)
Internal Medicine Clinic Attending  I saw and evaluated the patient.  I personally confirmed the key portions of the history and exam documented by Dr. Krienke and I reviewed pertinent patient test results.  The assessment, diagnosis, and plan were formulated together and I agree with the documentation in the resident's note.    

## 2018-02-08 NOTE — Addendum Note (Signed)
Addended by: Gilles Chiquito B on: 02/08/2018 02:04 PM   Modules accepted: Level of Service

## 2018-02-10 DIAGNOSIS — G4733 Obstructive sleep apnea (adult) (pediatric): Secondary | ICD-10-CM | POA: Diagnosis not present

## 2018-02-11 DIAGNOSIS — Z7901 Long term (current) use of anticoagulants: Secondary | ICD-10-CM | POA: Diagnosis not present

## 2018-02-11 DIAGNOSIS — I480 Paroxysmal atrial fibrillation: Secondary | ICD-10-CM | POA: Diagnosis not present

## 2018-02-11 LAB — POCT INR: INR: 3.7 — AB (ref 2.0–3.0)

## 2018-02-12 ENCOUNTER — Telehealth (INDEPENDENT_AMBULATORY_CARE_PROVIDER_SITE_OTHER): Payer: Medicare Other | Admitting: *Deleted

## 2018-02-12 DIAGNOSIS — Z86711 Personal history of pulmonary embolism: Secondary | ICD-10-CM | POA: Diagnosis not present

## 2018-02-12 DIAGNOSIS — Z86718 Personal history of other venous thrombosis and embolism: Secondary | ICD-10-CM | POA: Diagnosis not present

## 2018-02-12 DIAGNOSIS — Z7901 Long term (current) use of anticoagulants: Secondary | ICD-10-CM

## 2018-02-12 DIAGNOSIS — Z5181 Encounter for therapeutic drug level monitoring: Secondary | ICD-10-CM

## 2018-02-12 NOTE — Progress Notes (Signed)
Anticoagulation Management Jacqueline Nguyen a 84 y.o.femalewhois being monitored for warfarin therapy using home INR meter  Indication: DVT and PEhistory, paroxysmal atrial fibrillation Duration: indefinite Supervising physician:Emily [facility]  Anticoagulation Clinic Visit History: Patient reports no signs/symptoms of concern.  Anticoagulation Episode Summary    Current INR goal:   2.0-3.0  TTR:   68.1 % (1.5 y)  Next INR check:   02/18/2018  INR from last check:   3.7! (02/11/2018)  Weekly max warfarin dose:     Target end date:     INR check location:     Preferred lab:     Send INR reminders to:      Indications   History of DVT (deep vein thrombosis) [Z86.718] Anticoagulated on Coumadin [Z79.01] History of pulmonary embolism [Z86.711]       Comments:         Anticoagulation Care Providers    Provider Role Specialty Phone number   [name], MD Responsible Internal Medicine [phone]      Allergies  Allergen Reactions  . Ciprofloxacin   . Bactrim Rash  . Codeine Nausea Only   Medication Sig  acetic acid-hydrocortisone (VOSOL-HC) OTIC solution Place 3 drops into the right ear 4 (four) times daily. For 7 days  albuterol (PROVENTIL HFA;VENTOLIN HFA) 108 (90 Base) MCG/ACT inhaler Inhale 1-2 puffs into the lungs daily as needed for shortness of breath or wheezing.  atenolol (TENORMIN) 25 MG tablet Take 1 tablet (25 mg total) by mouth 4 (four) times daily as needed. MAY TAKE 1 TABLET 4 TIMES DAILY AS NEEDED.  AZOPT 1 % ophthalmic suspension Place 1 drop into both eyes 2 (two) times daily.  budesonide-formoterol (SYMBICORT) 160-4.5 MCG/ACT inhaler Inhale 2 puffs into the lungs every 12 (twelve) hours.  COMBIGAN 0.2-0.5 % ophthalmic solution Place 1 drop into both eyes 2 (two) times daily.  diclofenac sodium (VOLTAREN) 1 % GEL Apply 2 g topically 4 (four) times daily as needed.  diltiazem (CARDIZEM CD) 120 MG 24 hr capsule TAKE ONE (1) CAPSULE BY MOUTH 2 TIMES  DAILY  diltiazem (TIAZAC) 120 MG 24 hr capsule TAKE ONE Capsule BY MOUTH TWICE DAILY  FLOVENT HFA 44 MCG/ACT inhaler INHALE 1 PUFF TWICE A DAY DAILY  furosemide (LASIX) 20 MG tablet Take 2 tablets (40 mg total) by mouth daily.  HYDROcodone-acetaminophen (NORCO) 10-325 MG tablet Take 1 tablet by mouth every 6 (six) hours as needed for severe pain.  levalbuterol (XOPENEX) 0.63 MG/3ML nebulizer solution Take 3 mLs (0.63 mg total) by nebulization every 8 (eight) hours as needed for wheezing.  NYAMYC powder APPLY TOPICALLY FOUR TIMES DAILY  potassium chloride (K-DUR) 10 MEQ tablet Take 1 tablet (10 mEq total) by mouth daily as directed.  Respiratory Therapy Supplies (FLUTTER) DEVI Blow through 4 times per set, three sets daily  triamcinolone (KENALOG) 0.1 % paste Use as directed 1 application in the mouth or throat 2 (two) times daily.  warfarin (COUMADIN) 3 MG tablet TAKE ONE TABLET BY MOUTH EVERY DAY   Past Medical History:  Diagnosis Date  . Arthritis   . Cataract   . Chronic respiratory failure ([place])   . Endometriosis   . HTN (hypertension)   . Hx of echocardiogram    Echo (8/15):  Mild LVH, EF 65%, no RWMA, Ao sclerosis, no AS, mod MAC, mild LAE, normal RVSF, PASP 40 mmHg  . Hx pulmonary embolism    on chronic coumadin  . Obesities, morbid ([place])   . OSA (obstructive sleep apnea)   .  Osteoporosis   . PAF (paroxysmal atrial fibrillation) ([place])    Event Monitor (8/15):  Frequent PACs, brief bursts of nonsustained ATach; no AFib  . Psoriasis   . Skin cancer   . Vertigo    Social History   Socioeconomic History  . Marital status: Widowed    Spouse name: Not on file  . Number of children: Not on file  . Years of education: Not on file  . Highest education level: Not on file  Occupational History  . Not on file  Social Needs  . Financial resource strain: Not on file  . Food insecurity:    Worry: Not on file    Inability: Not on file  . Transportation needs:    Medical: Not on  file    Non-medical: Not on file  Tobacco Use  . Smoking status: Former Smoker    Packs/day: 1.00    Years: 2.00    Pack years: 2.00    Types: Cigarettes    Last attempt to quit: 04/03/1981    Years since quitting: 36.8  . Smokeless tobacco: Never Used  Substance and Sexual Activity  . Alcohol use: No  . Drug use: No  . Sexual activity: Not Currently  Lifestyle  . Physical activity:    Days per week: Not on file    Minutes per session: Not on file  . Stress: Not on file  Relationships  . Social connections:    Talks on phone: Not on file    Gets together: Not on file    Attends religious service: Not on file    Active member of club or organization: Not on file    Attends meetings of clubs or organizations: Not on file    Relationship status: Not on file  Other Topics Concern  . Not on file  Social History Narrative   She lives with her sister in [place] and requires assistance at home. Has an aid who visits and helps at home often. Son works as a Catering manager and also visits and helps when he can. Semi-independent in ADLs, requires walker around home, dependent in IADLs.   Family History  Problem Relation Age of Onset  . Heart attack Father   . Heart attack Brother   . Stroke Neg Hx    ASSESSMENT Recent Results: Lab Results  Component Value Date   INR 3.7 (A) 02/11/2018   INR 3.3 (A) 02/04/2018   INR 2.6 01/28/2018    INR today: Supratherapeutic  PLAN Patient reports taking 1 full tablet daily (21 mg weekly). Provided education on dosing and advised patient to reduce to 1/2 tablet Tue/Thu/Sat (16.5 mg weekly), asked patient to repeat instructions back x 3 for confirmation.  There are no Patient Instructions on file for this visit. Patient advised to contact clinic or seek medical attention if signs/symptoms of bleeding or thromboembolism occur.  Patient verbalized understanding by repeating back information and was advised to contact me if further  medication-related questions arise. Patient was also provided an information handout.  Follow-up 1 week  [name]

## 2018-02-12 NOTE — Telephone Encounter (Signed)
INR is reported by remote lab Vickie reports 02/11/18 INR 3.7. States it was faxed also. Sending to dr's mullen, kim and groce

## 2018-02-13 NOTE — Telephone Encounter (Signed)
Reviewed note by Dr. Maudie Mercury and agree with plan.

## 2018-02-14 ENCOUNTER — Telehealth: Payer: Self-pay | Admitting: Internal Medicine

## 2018-02-14 NOTE — Telephone Encounter (Signed)
rtc to terry, son. He stated he is at [facility] and will call back

## 2018-02-14 NOTE — Telephone Encounter (Signed)
That's good news.  Thank you!

## 2018-02-14 NOTE — Telephone Encounter (Signed)
Pt's son , [name] - stated pt is taking the medication as prescribed and she does not have any swelling. She last saw Dr Sherry Ruffing on 11/7.

## 2018-02-14 NOTE — Telephone Encounter (Signed)
Pt's son would like a call back to give an update on the patient's health.

## 2018-02-18 ENCOUNTER — Encounter: Payer: Self-pay | Admitting: Pharmacist

## 2018-02-18 DIAGNOSIS — Z86711 Personal history of pulmonary embolism: Secondary | ICD-10-CM

## 2018-02-18 DIAGNOSIS — Z7901 Long term (current) use of anticoagulants: Secondary | ICD-10-CM

## 2018-02-18 DIAGNOSIS — Z86718 Personal history of other venous thrombosis and embolism: Secondary | ICD-10-CM

## 2018-02-18 LAB — POCT INR: INR: 2 (ref 2.0–3.0)

## 2018-02-18 NOTE — Progress Notes (Signed)
Anticoagulation Management Jacqueline Nguyen a 84 y.o.femalewhois being monitored for warfarin therapy using home INR meter  Indication: DVT and PEhistory, paroxysmal atrial fibrillation Duration: indefinite Supervising physician:Emily [facility]  Anticoagulation Clinic Visit History: Patient reports no signs/symptoms of concern.  Anticoagulation Episode Summary    Current INR goal:   2.0-3.0  TTR:   68.0 % (1.6 y)  Next INR check:   02/25/2018  INR from last check:   2.0 (02/18/2018)  Weekly max warfarin dose:     Target end date:     INR check location:     Preferred lab:     Send INR reminders to:      Indications   History of DVT (deep vein thrombosis) [Z86.718] Anticoagulated on Coumadin [Z79.01] History of pulmonary embolism [Z86.711]       Comments:         Anticoagulation Care Providers    Provider Role Specialty Phone number   [name], MD Responsible Internal Medicine [phone]     Allergies  Allergen Reactions  . Ciprofloxacin   . Bactrim Rash  . Codeine Nausea Only   Medication Sig  acetic acid-hydrocortisone (VOSOL-HC) OTIC solution Place 3 drops into the right ear 4 (four) times daily. For 7 days  albuterol (PROVENTIL HFA;VENTOLIN HFA) 108 (90 Base) MCG/ACT inhaler Inhale 1-2 puffs into the lungs daily as needed for shortness of breath or wheezing.  atenolol (TENORMIN) 25 MG tablet Take 1 tablet (25 mg total) by mouth 4 (four) times daily as needed. MAY TAKE 1 TABLET 4 TIMES DAILY AS NEEDED.  AZOPT 1 % ophthalmic suspension Place 1 drop into both eyes 2 (two) times daily.  budesonide-formoterol (SYMBICORT) 160-4.5 MCG/ACT inhaler Inhale 2 puffs into the lungs every 12 (twelve) hours.  COMBIGAN 0.2-0.5 % ophthalmic solution Place 1 drop into both eyes 2 (two) times daily.  diclofenac sodium (VOLTAREN) 1 % GEL Apply 2 g topically 4 (four) times daily as needed.  diltiazem (CARDIZEM CD) 120 MG 24 hr capsule TAKE ONE (1) CAPSULE BY MOUTH 2 TIMES  DAILY  diltiazem (TIAZAC) 120 MG 24 hr capsule TAKE ONE Capsule BY MOUTH TWICE DAILY  FLOVENT HFA 44 MCG/ACT inhaler INHALE 1 PUFF TWICE A DAY DAILY  furosemide (LASIX) 20 MG tablet Take 2 tablets (40 mg total) by mouth daily.  HYDROcodone-acetaminophen (NORCO) 10-325 MG tablet Take 1 tablet by mouth every 6 (six) hours as needed for severe pain.  levalbuterol (XOPENEX) 0.63 MG/3ML nebulizer solution Take 3 mLs (0.63 mg total) by nebulization every 8 (eight) hours as needed for wheezing.  NYAMYC powder APPLY TOPICALLY FOUR TIMES DAILY  potassium chloride (K-DUR) 10 MEQ tablet Take 1 tablet (10 mEq total) by mouth daily as directed.  Respiratory Therapy Supplies (FLUTTER) DEVI Blow through 4 times per set, three sets daily  triamcinolone (KENALOG) 0.1 % paste Use as directed 1 application in the mouth or throat 2 (two) times daily.  warfarin (COUMADIN) 3 MG tablet TAKE ONE TABLET BY MOUTH EVERY DAY   Past Medical History:  Diagnosis Date  . Arthritis   . Cataract   . Chronic respiratory failure ([place])   . Endometriosis   . HTN (hypertension)   . Hx of echocardiogram    Echo (8/15):  Mild LVH, EF 65%, no RWMA, Ao sclerosis, no AS, mod MAC, mild LAE, normal RVSF, PASP 40 mmHg  . Hx pulmonary embolism    on chronic coumadin  . Obesities, morbid ([place])   . OSA (obstructive sleep apnea)   .  Osteoporosis   . PAF (paroxysmal atrial fibrillation) ([place])    Event Monitor (8/15):  Frequent PACs, brief bursts of nonsustained ATach; no AFib  . Psoriasis   . Skin cancer   . Vertigo    Social History   Socioeconomic History  . Marital status: Widowed    Spouse name: Not on file  . Number of children: Not on file  . Years of education: Not on file  . Highest education level: Not on file  Occupational History  . Not on file  Social Needs  . Financial resource strain: Not on file  . Food insecurity:    Worry: Not on file    Inability: Not on file  . Transportation needs:    Medical: Not on  file    Non-medical: Not on file  Tobacco Use  . Smoking status: Former Smoker    Packs/day: 1.00    Years: 2.00    Pack years: 2.00    Types: Cigarettes    Last attempt to quit: 04/03/1981    Years since quitting: 36.9  . Smokeless tobacco: Never Used  Substance and Sexual Activity  . Alcohol use: No  . Drug use: No  . Sexual activity: Not Currently  Lifestyle  . Physical activity:    Days per week: Not on file    Minutes per session: Not on file  . Stress: Not on file  Relationships  . Social connections:    Talks on phone: Not on file    Gets together: Not on file    Attends religious service: Not on file    Active member of club or organization: Not on file    Attends meetings of clubs or organizations: Not on file    Relationship status: Not on file  Other Topics Concern  . Not on file  Social History Narrative   She lives with her sister in [place] and requires assistance at home. Has an aid who visits and helps at home often. Son works as a Catering manager and also visits and helps when he can. Semi-independent in ADLs, requires walker around home, dependent in IADLs.   Family History  Problem Relation Age of Onset  . Heart attack Father   . Heart attack Brother   . Stroke Neg Hx    ASSESSMENT Lab Results  Component Value Date   INR 2.0 02/18/2018   INR 3.7 (A) 02/11/2018   INR 3.3 (A) 02/04/2018    INR today: Therapeutic  PLAN Weekly dose was unchanged   There are no Patient Instructions on file for this visit. Patient advised to contact clinic or seek medical attention if signs/symptoms of bleeding or thromboembolism occur.  Patient verbalized understanding by repeating back information and was advised to contact me if further medication-related questions arise. Patient was also provided an information handout.  Follow-up Return in about 1 week (around 02/25/2018).  [name]

## 2018-02-19 DIAGNOSIS — I509 Heart failure, unspecified: Secondary | ICD-10-CM | POA: Diagnosis not present

## 2018-02-19 DIAGNOSIS — R262 Difficulty in walking, not elsewhere classified: Secondary | ICD-10-CM | POA: Diagnosis not present

## 2018-02-19 DIAGNOSIS — J449 Chronic obstructive pulmonary disease, unspecified: Secondary | ICD-10-CM | POA: Diagnosis not present

## 2018-02-19 DIAGNOSIS — J441 Chronic obstructive pulmonary disease with (acute) exacerbation: Secondary | ICD-10-CM | POA: Diagnosis not present

## 2018-02-19 DIAGNOSIS — M160 Bilateral primary osteoarthritis of hip: Secondary | ICD-10-CM | POA: Diagnosis not present

## 2018-02-19 LAB — PROTIME-INR

## 2018-02-25 LAB — POCT INR: INR: 1.7 — AB (ref 2.0–3.0)

## 2018-02-26 ENCOUNTER — Encounter: Payer: Self-pay | Admitting: Pharmacist

## 2018-02-26 DIAGNOSIS — Z86718 Personal history of other venous thrombosis and embolism: Secondary | ICD-10-CM

## 2018-02-26 DIAGNOSIS — Z86711 Personal history of pulmonary embolism: Secondary | ICD-10-CM

## 2018-02-26 DIAGNOSIS — Z7901 Long term (current) use of anticoagulants: Secondary | ICD-10-CM

## 2018-02-26 NOTE — Patient Instructions (Signed)
Patient educated about medication as defined in this encounter and verbalized understanding by repeating back instructions provided.   

## 2018-02-26 NOTE — Progress Notes (Signed)
Anticoagulation Management Jacqueline Nguyen a 84 y.o.femalewhois being monitored for warfarin therapy using home INR meter  Indication: DVT and PEhistory, paroxysmal atrial fibrillation Duration: indefinite Supervising physician:[facility]  Anticoagulation Clinic Visit History: Patient reports no signs/symptoms of concern. Patient reports increased dietary vitamin K (more greens, cabbage, etc.)  Anticoagulation Episode Summary    Current INR goal:   2.0-3.0  TTR:   67.1 % (1.6 y)  Next INR check:   03/04/2018  INR from last check:   1.7! (02/25/2018)  Weekly max warfarin dose:     Target end date:     INR check location:     Preferred lab:     Send INR reminders to:      Indications   History of DVT (deep vein thrombosis) [Z86.718] Anticoagulated on Coumadin [Z79.01] History of pulmonary embolism [Z86.711]       Comments:         Anticoagulation Care Providers    Provider Role Specialty Phone number   [name], MD Responsible Internal Medicine [phone]      Allergies  Allergen Reactions  . Ciprofloxacin   . Bactrim Rash  . Codeine Nausea Only   Medication Sig  acetic acid-hydrocortisone (VOSOL-HC) OTIC solution Place 3 drops into the right ear 4 (four) times daily. For 7 days  albuterol (PROVENTIL HFA;VENTOLIN HFA) 108 (90 Base) MCG/ACT inhaler Inhale 1-2 puffs into the lungs daily as needed for shortness of breath or wheezing.  atenolol (TENORMIN) 25 MG tablet Take 1 tablet (25 mg total) by mouth 4 (four) times daily as needed. MAY TAKE 1 TABLET 4 TIMES DAILY AS NEEDED.  AZOPT 1 % ophthalmic suspension Place 1 drop into both eyes 2 (two) times daily.  budesonide-formoterol (SYMBICORT) 160-4.5 MCG/ACT inhaler Inhale 2 puffs into the lungs every 12 (twelve) hours.  COMBIGAN 0.2-0.5 % ophthalmic solution Place 1 drop into both eyes 2 (two) times daily.  diclofenac sodium (VOLTAREN) 1 % GEL Apply 2 g topically 4 (four) times daily as needed.  diltiazem  (CARDIZEM CD) 120 MG 24 hr capsule TAKE ONE (1) CAPSULE BY MOUTH 2 TIMES DAILY  diltiazem (TIAZAC) 120 MG 24 hr capsule TAKE ONE Capsule BY MOUTH TWICE DAILY  FLOVENT HFA 44 MCG/ACT inhaler INHALE 1 PUFF TWICE A DAY DAILY  furosemide (LASIX) 20 MG tablet Take 2 tablets (40 mg total) by mouth daily.  HYDROcodone-acetaminophen (NORCO) 10-325 MG tablet Take 1 tablet by mouth every 6 (six) hours as needed for severe pain.  levalbuterol (XOPENEX) 0.63 MG/3ML nebulizer solution Take 3 mLs (0.63 mg total) by nebulization every 8 (eight) hours as needed for wheezing.  NYAMYC powder APPLY TOPICALLY FOUR TIMES DAILY  potassium chloride (K-DUR) 10 MEQ tablet Take 1 tablet (10 mEq total) by mouth daily as directed.  Respiratory Therapy Supplies (FLUTTER) DEVI Blow through 4 times per set, three sets daily  triamcinolone (KENALOG) 0.1 % paste Use as directed 1 application in the mouth or throat 2 (two) times daily.  warfarin (COUMADIN) 3 MG tablet TAKE ONE TABLET BY MOUTH EVERY DAY   Past Medical History:  Diagnosis Date  . Arthritis   . Cataract   . Chronic respiratory failure ([place])   . Endometriosis   . HTN (hypertension)   . Hx of echocardiogram    Echo (8/15):  Mild LVH, EF 65%, no RWMA, Ao sclerosis, no AS, mod MAC, mild LAE, normal RVSF, PASP 40 mmHg  . Hx pulmonary embolism    on chronic coumadin  . Obesities,  morbid ([place])   . OSA (obstructive sleep apnea)   . Osteoporosis   . PAF (paroxysmal atrial fibrillation) ([place])    Event Monitor (8/15):  Frequent PACs, brief bursts of nonsustained ATach; no AFib  . Psoriasis   . Skin cancer   . Vertigo    Social History   Socioeconomic History  . Marital status: Widowed    Spouse name: Not on file  . Number of children: Not on file  . Years of education: Not on file  . Highest education level: Not on file  Occupational History  . Not on file  Social Needs  . Financial resource strain: Not on file  . Food insecurity:    Worry: Not on file     Inability: Not on file  . Transportation needs:    Medical: Not on file    Non-medical: Not on file  Tobacco Use  . Smoking status: Former Smoker    Packs/day: 1.00    Years: 2.00    Pack years: 2.00    Types: Cigarettes    Last attempt to quit: 04/03/1981    Years since quitting: 36.9  . Smokeless tobacco: Never Used  Substance and Sexual Activity  . Alcohol use: No  . Drug use: No  . Sexual activity: Not Currently  Lifestyle  . Physical activity:    Days per week: Not on file    Minutes per session: Not on file  . Stress: Not on file  Relationships  . Social connections:    Talks on phone: Not on file    Gets together: Not on file    Attends religious service: Not on file    Active member of club or organization: Not on file    Attends meetings of clubs or organizations: Not on file    Relationship status: Not on file  Other Topics Concern  . Not on file  Social History Narrative   She lives with her sister in [place] and requires assistance at home. Has an aid who visits and helps at home often. Son works as a Catering manager and also visits and helps when he can. Semi-independent in ADLs, requires walker around home, dependent in IADLs.   Family History  Problem Relation Age of Onset  . Heart attack Father   . Heart attack Brother   . Stroke Neg Hx    ASSESSMENT Lab Results  Component Value Date   INR 1.7 (A) 02/25/2018   INR 2.0 02/18/2018   INR 3.7 (A) 02/11/2018   Anticoagulation Dosing: 1 tablet (3 mg) daily   INR today: Subtherapeutic  PLAN Weekly dose was unchanged, education was provided on dietary vitamin K intake.  Patient Instructions  Patient educated about medication as defined in this encounter and verbalized understanding by repeating back instructions provided.    Patient advised to contact clinic or seek medical attention if signs/symptoms of bleeding or thromboembolism occur.  Patient verbalized understanding by repeating back  information and was advised to contact me if further medication-related questions arise. Patient was also provided an information handout.  Follow-up 1 week  [name]

## 2018-02-27 ENCOUNTER — Encounter: Payer: Medicare Other | Admitting: Internal Medicine

## 2018-02-27 DIAGNOSIS — G4733 Obstructive sleep apnea (adult) (pediatric): Secondary | ICD-10-CM | POA: Diagnosis not present

## 2018-02-27 NOTE — Progress Notes (Signed)
I reviewed Dr. Julianne Rice note and agree with plan.

## 2018-03-04 ENCOUNTER — Other Ambulatory Visit: Payer: Self-pay | Admitting: Internal Medicine

## 2018-03-04 LAB — POCT INR: INR: 1.4 — AB (ref 2.0–3.0)

## 2018-03-05 ENCOUNTER — Telehealth: Payer: Self-pay | Admitting: *Deleted

## 2018-03-05 ENCOUNTER — Telehealth (INDEPENDENT_AMBULATORY_CARE_PROVIDER_SITE_OTHER): Payer: Medicare Other | Admitting: Pharmacist

## 2018-03-05 DIAGNOSIS — Z7901 Long term (current) use of anticoagulants: Secondary | ICD-10-CM | POA: Diagnosis not present

## 2018-03-05 DIAGNOSIS — Z86711 Personal history of pulmonary embolism: Secondary | ICD-10-CM

## 2018-03-05 DIAGNOSIS — Z86718 Personal history of other venous thrombosis and embolism: Secondary | ICD-10-CM

## 2018-03-05 NOTE — Telephone Encounter (Signed)
Remote test site calls to report out of range INR test result INR  12/2   1.4 Result has also been faxed to [facility] Sending to dr's mullen and kim

## 2018-03-06 NOTE — Progress Notes (Signed)
Anticoagulation Management Jacqueline Nguyen a 84 y.o.femalewhois being monitored for warfarin therapy using home INR meter  Indication: DVT and PEhistory, paroxysmal atrial fibrillation Duration: indefinite Supervising physician:Emily [facility]  Anticoagulation Clinic Visit History: Patient reports no signs/symptoms of concern. Patient reports increased dietary vitamin K (more greens, cabbage, etc.)  Anticoagulation Episode Summary    Current INR goal:   2.0-3.0  TTR:   66.3 % (1.6 y)  Next INR check:   03/11/2018  INR from last check:   1.4! (03/04/2018)  Weekly max warfarin dose:     Target end date:     INR check location:     Preferred lab:     Send INR reminders to:      Indications   History of DVT (deep vein thrombosis) [Z86.718] Anticoagulated on Coumadin [Z79.01] History of pulmonary embolism [Z86.711]       Comments:         Anticoagulation Care Providers    Provider Role Specialty Phone number   [name], MD Responsible Internal Medicine [phone]     Allergies  Allergen Reactions  . Ciprofloxacin   . Bactrim Rash  . Codeine Nausea Only   Medication Sig  acetic acid-hydrocortisone (VOSOL-HC) OTIC solution Place 3 drops into the right ear 4 (four) times daily. For 7 days  albuterol (PROVENTIL HFA;VENTOLIN HFA) 108 (90 Base) MCG/ACT inhaler Inhale 1-2 puffs into the lungs daily as needed for shortness of breath or wheezing.  atenolol (TENORMIN) 25 MG tablet Take 1 tablet (25 mg total) by mouth 4 (four) times daily as needed. MAY TAKE 1 TABLET 4 TIMES DAILY AS NEEDED.  AZOPT 1 % ophthalmic suspension Place 1 drop into both eyes 2 (two) times daily.  budesonide-formoterol (SYMBICORT) 160-4.5 MCG/ACT inhaler Inhale 2 puffs into the lungs every 12 (twelve) hours.  COMBIGAN 0.2-0.5 % ophthalmic solution Place 1 drop into both eyes 2 (two) times daily.  diclofenac sodium (VOLTAREN) 1 % GEL Apply 2 g topically 4 (four) times daily as needed.  diltiazem  (CARDIZEM CD) 120 MG 24 hr capsule TAKE ONE (1) CAPSULE BY MOUTH 2 TIMES DAILY  diltiazem (TIAZAC) 120 MG 24 hr capsule TAKE ONE Capsule BY MOUTH TWICE DAILY  FLOVENT HFA 44 MCG/ACT inhaler INHALE 1 PUFF TWICE A DAY DAILY  furosemide (LASIX) 20 MG tablet Take 2 tablets (40 mg total) by mouth daily.  HYDROcodone-acetaminophen (NORCO) 10-325 MG tablet Take 1 tablet by mouth every 6 (six) hours as needed for severe pain.  levalbuterol (XOPENEX) 0.63 MG/3ML nebulizer solution Take 3 mLs (0.63 mg total) by nebulization every 8 (eight) hours as needed for wheezing.  NYAMYC powder APPLY TOPICALLY FOUR TIMES DAILY  potassium chloride (K-DUR) 10 MEQ tablet Take 1 tablet (10 mEq total) by mouth daily as directed.  Respiratory Therapy Supplies (FLUTTER) DEVI Blow through 4 times per set, three sets daily  triamcinolone (KENALOG) 0.1 % paste Use as directed 1 application in the mouth or throat 2 (two) times daily.  warfarin (COUMADIN) 3 MG tablet TAKE ONE TABLET BY MOUTH EVERY DAY   Past Medical History:  Diagnosis Date  . Arthritis   . Cataract   . Chronic respiratory failure ([place])   . Endometriosis   . HTN (hypertension)   . Hx of echocardiogram    Echo (8/15):  Mild LVH, EF 65%, no RWMA, Ao sclerosis, no AS, mod MAC, mild LAE, normal RVSF, PASP 40 mmHg  . Hx pulmonary embolism    on chronic coumadin  . Obesities, morbid (  [place])   . OSA (obstructive sleep apnea)   . Osteoporosis   . PAF (paroxysmal atrial fibrillation) ([place])    Event Monitor (8/15):  Frequent PACs, brief bursts of nonsustained ATach; no AFib  . Psoriasis   . Skin cancer   . Vertigo    Social History   Socioeconomic History  . Marital status: Widowed    Spouse name: Not on file  . Number of children: Not on file  . Years of education: Not on file  . Highest education level: Not on file  Occupational History  . Not on file  Social Needs  . Financial resource strain: Not on file  . Food insecurity:    Worry: Not on file     Inability: Not on file  . Transportation needs:    Medical: Not on file    Non-medical: Not on file  Tobacco Use  . Smoking status: Former Smoker    Packs/day: 1.00    Years: 2.00    Pack years: 2.00    Types: Cigarettes    Last attempt to quit: 04/03/1981    Years since quitting: 36.9  . Smokeless tobacco: Never Used  Substance and Sexual Activity  . Alcohol use: No  . Drug use: No  . Sexual activity: Not Currently  Lifestyle  . Physical activity:    Days per week: Not on file    Minutes per session: Not on file  . Stress: Not on file  Relationships  . Social connections:    Talks on phone: Not on file    Gets together: Not on file    Attends religious service: Not on file    Active member of club or organization: Not on file    Attends meetings of clubs or organizations: Not on file    Relationship status: Not on file  Other Topics Concern  . Not on file  Social History Narrative   She lives with her sister in [place] and requires assistance at home. Has an aid who visits and helps at home often. Son works as a Catering manager and also visits and helps when he can. Semi-independent in ADLs, requires walker around home, dependent in IADLs.   Family History  Problem Relation Age of Onset  . Heart attack Father   . Heart attack Brother   . Stroke Neg Hx    ASSESSMENT Lab Results  Component Value Date   INR 1.4 (A) 03/04/2018   INR 1.7 (A) 02/25/2018   INR 2.0 02/18/2018   INR today: Subtherapeutic  PLAN Spoke to patient's son, who assists in her care. He states patient last filled warfarin 02/06/18 for 30-day supply but still has 30 tablets in the bottle. However, son states patient did not miss a dose of warfarin over the past week (may have had tablets left over from a previous prescription). Weekly dose was increased by 21%. Reinforced education on risk of clotting/bleeding with labile INR and importance of adherence.  There are no Patient Instructions on  file for this visit. Patient advised to contact clinic or seek medical attention if signs/symptoms of bleeding or thromboembolism occur.  Patient verbalized understanding by repeating back information and was advised to contact me if further medication-related questions arise. Patient was also provided an information handout.  Follow-up 5 days  [name]

## 2018-03-08 LAB — PROTIME-INR

## 2018-03-11 ENCOUNTER — Encounter: Payer: Self-pay | Admitting: Pharmacist

## 2018-03-11 DIAGNOSIS — Z86718 Personal history of other venous thrombosis and embolism: Secondary | ICD-10-CM

## 2018-03-11 DIAGNOSIS — Z7901 Long term (current) use of anticoagulants: Secondary | ICD-10-CM | POA: Diagnosis not present

## 2018-03-11 DIAGNOSIS — Z86711 Personal history of pulmonary embolism: Secondary | ICD-10-CM

## 2018-03-11 DIAGNOSIS — I480 Paroxysmal atrial fibrillation: Secondary | ICD-10-CM | POA: Diagnosis not present

## 2018-03-11 LAB — POCT INR: INR: 2.8 (ref 2.0–3.0)

## 2018-03-12 ENCOUNTER — Telehealth: Payer: Self-pay | Admitting: *Deleted

## 2018-03-12 DIAGNOSIS — G4733 Obstructive sleep apnea (adult) (pediatric): Secondary | ICD-10-CM | POA: Diagnosis not present

## 2018-03-12 NOTE — Telephone Encounter (Signed)
INR done 12/9 :                     2.8 Sending to dr Maudie Mercury and dr Daryll Drown, actual written report on dr kims desk

## 2018-03-12 NOTE — Telephone Encounter (Signed)
Followed up with patient regarding INR (pls see separate anticoag note if needed)

## 2018-03-14 LAB — PROTIME-INR

## 2018-03-14 NOTE — Progress Notes (Signed)
Anticoagulation Management Jacqueline Nguyen a 84 y.o.femalewhois being monitored for warfarin therapy using home INR meter  Indication: DVT and PEhistory, paroxysmal atrial fibrillation Duration: indefinite Supervising physician:Emily [facility]  Anticoagulation Clinic Visit History: Patient reports no signs/symptoms of concern.  Anticoagulation Episode Summary    Current INR goal:   2.0-3.0  TTR:   66.2 % (1.6 y)  Next INR check:   03/18/2018  INR from last check:   2.8 (03/11/2018)  Weekly max warfarin dose:     Target end date:     INR check location:     Preferred lab:     Send INR reminders to:      Indications   History of DVT (deep vein thrombosis) [Z86.718] Anticoagulated on Coumadin [Z79.01] History of pulmonary embolism [Z86.711]       Comments:         Anticoagulation Care Providers    Provider Role Specialty Phone number   [name], MD Responsible Internal Medicine [phone]     Allergies  Allergen Reactions  . Ciprofloxacin   . Bactrim Rash  . Codeine Nausea Only   Medication Sig  acetic acid-hydrocortisone (VOSOL-HC) OTIC solution Place 3 drops into the right ear 4 (four) times daily. For 7 days  albuterol (PROVENTIL HFA;VENTOLIN HFA) 108 (90 Base) MCG/ACT inhaler Inhale 1-2 puffs into the lungs daily as needed for shortness of breath or wheezing.  atenolol (TENORMIN) 25 MG tablet Take 1 tablet (25 mg total) by mouth 4 (four) times daily as needed. MAY TAKE 1 TABLET 4 TIMES DAILY AS NEEDED.  AZOPT 1 % ophthalmic suspension Place 1 drop into both eyes 2 (two) times daily.  budesonide-formoterol (SYMBICORT) 160-4.5 MCG/ACT inhaler Inhale 2 puffs into the lungs every 12 (twelve) hours.  COMBIGAN 0.2-0.5 % ophthalmic solution Place 1 drop into both eyes 2 (two) times daily.  diclofenac sodium (VOLTAREN) 1 % GEL Apply 2 g topically 4 (four) times daily as needed.  diltiazem (CARDIZEM CD) 120 MG 24 hr capsule TAKE ONE (1) CAPSULE BY MOUTH 2 TIMES  DAILY  diltiazem (TIAZAC) 120 MG 24 hr capsule TAKE ONE Capsule BY MOUTH TWICE DAILY  FLOVENT HFA 44 MCG/ACT inhaler INHALE 1 PUFF TWICE A DAY DAILY  furosemide (LASIX) 20 MG tablet Take 2 tablets (40 mg total) by mouth daily.  HYDROcodone-acetaminophen (NORCO) 10-325 MG tablet Take 1 tablet by mouth every 6 (six) hours as needed for severe pain.  levalbuterol (XOPENEX) 0.63 MG/3ML nebulizer solution Take 3 mLs (0.63 mg total) by nebulization every 8 (eight) hours as needed for wheezing.  NYAMYC powder APPLY TOPICALLY FOUR TIMES DAILY  potassium chloride (K-DUR) 10 MEQ tablet Take 1 tablet (10 mEq total) by mouth daily as directed.  Respiratory Therapy Supplies (FLUTTER) DEVI Blow through 4 times per set, three sets daily  triamcinolone (KENALOG) 0.1 % paste Use as directed 1 application in the mouth or throat 2 (two) times daily.  warfarin (COUMADIN) 3 MG tablet TAKE ONE TABLET BY MOUTH EVERY DAY   Past Medical History:  Diagnosis Date  . Arthritis   . Cataract   . Chronic respiratory failure ([place])   . Endometriosis   . HTN (hypertension)   . Hx of echocardiogram    Echo (8/15):  Mild LVH, EF 65%, no RWMA, Ao sclerosis, no AS, mod MAC, mild LAE, normal RVSF, PASP 40 mmHg  . Hx pulmonary embolism    on chronic coumadin  . Obesities, morbid ([place])   . OSA (obstructive sleep apnea)   .  Osteoporosis   . PAF (paroxysmal atrial fibrillation) ([place])    Event Monitor (8/15):  Frequent PACs, brief bursts of nonsustained ATach; no AFib  . Psoriasis   . Skin cancer   . Vertigo    Social History   Socioeconomic History  . Marital status: Widowed    Spouse name: Not on file  . Number of children: Not on file  . Years of education: Not on file  . Highest education level: Not on file  Occupational History  . Not on file  Social Needs  . Financial resource strain: Not on file  . Food insecurity:    Worry: Not on file    Inability: Not on file  . Transportation needs:    Medical: Not on  file    Non-medical: Not on file  Tobacco Use  . Smoking status: Former Smoker    Packs/day: 1.00    Years: 2.00    Pack years: 2.00    Types: Cigarettes    Last attempt to quit: 04/03/1981    Years since quitting: 36.9  . Smokeless tobacco: Never Used  Substance and Sexual Activity  . Alcohol use: No  . Drug use: No  . Sexual activity: Not Currently  Lifestyle  . Physical activity:    Days per week: Not on file    Minutes per session: Not on file  . Stress: Not on file  Relationships  . Social connections:    Talks on phone: Not on file    Gets together: Not on file    Attends religious service: Not on file    Active member of club or organization: Not on file    Attends meetings of clubs or organizations: Not on file    Relationship status: Not on file  Other Topics Concern  . Not on file  Social History Narrative   She lives with her sister in [place] and requires assistance at home. Has an aid who visits and helps at home often. Son works as a Catering manager and also visits and helps when he can. Semi-independent in ADLs, requires walker around home, dependent in IADLs.   Family History  Problem Relation Age of Onset  . Heart attack Father   . Heart attack Brother   . Stroke Neg Hx    ASSESSMENT Recent Results: Lab Results  Component Value Date   INR 2.8 03/11/2018   INR 1.4 (A) 03/04/2018   INR 1.7 (A) 02/25/2018   INR today: Therapeutic  PLAN Weekly dose was unchanged  There are no Patient Instructions on file for this visit. Patient advised to contact clinic or seek medical attention if signs/symptoms of bleeding or thromboembolism occur.  Patient verbalized understanding by repeating back information and was advised to contact me if further medication-related questions arise. Patient was also provided an information handout.  Follow-up 1 week  [name]

## 2018-03-18 ENCOUNTER — Encounter: Payer: Self-pay | Admitting: Pharmacist

## 2018-03-18 DIAGNOSIS — Z7901 Long term (current) use of anticoagulants: Secondary | ICD-10-CM

## 2018-03-18 DIAGNOSIS — Z86718 Personal history of other venous thrombosis and embolism: Secondary | ICD-10-CM

## 2018-03-18 DIAGNOSIS — Z86711 Personal history of pulmonary embolism: Secondary | ICD-10-CM

## 2018-03-18 LAB — POCT INR: INR: 3.5 — AB (ref 2.0–3.0)

## 2018-03-21 NOTE — Progress Notes (Signed)
Anticoagulation Management Jacqueline Nguyen a 84 y.o.femalewhois being monitored for warfarin therapy using home INR meter  Indication: DVT and PEhistory, paroxysmal atrial fibrillation Duration: indefinite Supervising physician:Jacqueline Nguyen  Anticoagulation Clinic Visit History: Patient reports no signs/symptoms of concern.  Anticoagulation Episode Summary    Current INR goal:   2.0-3.0  TTR:   65.8 % (1.6 y)  Next INR check:   03/25/2018  INR from last check:   3.5! (03/18/2018)  Weekly max warfarin dose:     Target end date:     INR check location:     Preferred lab:     Send INR reminders to:      Indications   History of DVT (deep vein thrombosis) [Z86.718] Anticoagulated on Coumadin [Z79.01] History of pulmonary embolism [Z86.711]       Comments:         Anticoagulation Care Providers    Provider Role Specialty Phone number   Jacqueline Falcon, MD Responsible Internal Medicine [phone]     Allergies  Allergen Reactions  . Ciprofloxacin   . Bactrim Rash  . Codeine Nausea Only   Medication Sig  acetic acid-hydrocortisone (VOSOL-HC) OTIC solution Place 3 drops into the right ear 4 (four) times daily. For 7 days  albuterol (PROVENTIL HFA;VENTOLIN HFA) 108 (90 Base) MCG/ACT inhaler Inhale 1-2 puffs into the lungs daily as needed for shortness of breath or wheezing.  atenolol (TENORMIN) 25 MG tablet Take 1 tablet (25 mg total) by mouth 4 (four) times daily as needed. MAY TAKE 1 TABLET 4 TIMES DAILY AS NEEDED.  AZOPT 1 % ophthalmic suspension Place 1 drop into both eyes 2 (two) times daily.  budesonide-formoterol (SYMBICORT) 160-4.5 MCG/ACT inhaler Inhale 2 puffs into the lungs every 12 (twelve) hours.  COMBIGAN 0.2-0.5 % ophthalmic solution Place 1 drop into both eyes 2 (two) times daily.  diclofenac sodium (VOLTAREN) 1 % GEL Apply 2 g topically 4 (four) times daily as needed.  diltiazem (CARDIZEM CD) 120 MG 24 hr capsule TAKE ONE (1) CAPSULE BY MOUTH 2 TIMES  DAILY  diltiazem (TIAZAC) 120 MG 24 hr capsule TAKE ONE Capsule BY MOUTH TWICE DAILY  FLOVENT HFA 44 MCG/ACT inhaler INHALE 1 PUFF TWICE A DAY DAILY  furosemide (LASIX) 20 MG tablet Take 2 tablets (40 mg total) by mouth daily.  HYDROcodone-acetaminophen (NORCO) 10-325 MG tablet Take 1 tablet by mouth every 6 (six) hours as needed for severe pain.  levalbuterol (XOPENEX) 0.63 MG/3ML nebulizer solution Take 3 mLs (0.63 mg total) by nebulization every 8 (eight) hours as needed for wheezing.  NYAMYC powder APPLY TOPICALLY FOUR TIMES DAILY  potassium chloride (K-DUR) 10 MEQ tablet Take 1 tablet (10 mEq total) by mouth daily as directed.  Respiratory Therapy Supplies (FLUTTER) DEVI Blow through 4 times per set, three sets daily  triamcinolone (KENALOG) 0.1 % paste Use as directed 1 application in the mouth or throat 2 (two) times daily.  warfarin (COUMADIN) 3 MG tablet TAKE ONE TABLET BY MOUTH EVERY DAY   Past Medical History:  Diagnosis Date  . Arthritis   . Cataract   . Chronic respiratory failure ([place])   . Endometriosis   . HTN (hypertension)   . Hx of echocardiogram    Echo (8/15):  Mild LVH, EF 65%, no RWMA, Ao sclerosis, no AS, mod MAC, mild LAE, normal RVSF, PASP 40 mmHg  . Hx pulmonary embolism    on chronic coumadin  . Obesities, morbid ([place])   . OSA (obstructive sleep apnea)   .  Osteoporosis   . PAF (paroxysmal atrial fibrillation) ([place])    Event Monitor (8/15):  Frequent PACs, brief bursts of nonsustained ATach; no AFib  . Psoriasis   . Skin cancer   . Vertigo    Social History   Socioeconomic History  . Marital status: Widowed    Spouse name: Not on file  . Number of children: Not on file  . Years of education: Not on file  . Highest education level: Not on file  Occupational History  . Not on file  Social Needs  . Financial resource strain: Not on file  . Food insecurity:    Worry: Not on file    Inability: Not on file  . Transportation needs:    Medical: Not on  file    Non-medical: Not on file  Tobacco Use  . Smoking status: Former Smoker    Packs/day: 1.00    Years: 2.00    Pack years: 2.00    Types: Cigarettes    Last attempt to quit: 04/03/1981    Years since quitting: 36.9  . Smokeless tobacco: Never Used  Substance and Sexual Activity  . Alcohol use: No  . Drug use: No  . Sexual activity: Not Currently  Lifestyle  . Physical activity:    Days per week: Not on file    Minutes per session: Not on file  . Stress: Not on file  Relationships  . Social connections:    Talks on phone: Not on file    Gets together: Not on file    Attends religious service: Not on file    Active member of club or organization: Not on file    Attends meetings of clubs or organizations: Not on file    Relationship status: Not on file  Other Topics Concern  . Not on file  Social History Narrative   She lives with her sister in [place] and requires assistance at home. Has an aid who visits and helps at home often. Son works as a Catering manager and also visits and helps when he can. Semi-independent in ADLs, requires walker around home, dependent in IADLs.   Family History  Problem Relation Age of Onset  . Heart attack Father   . Heart attack Brother   . Stroke Neg Hx    ASSESSMENT Recent Results: Lab Results  Component Value Date   INR 3.5 (A) 03/18/2018   INR 2.8 03/11/2018   INR 1.4 (A) 03/04/2018   Anticoagulation Dosing: 3 mg daily, except 4.5 mg on Wed/Fri   INR today: Supratherapeutic  PLAN Weekly dose was decreased by 12% to 3 mg daily  There are no Patient Instructions on file for this visit. Patient advised to contact clinic or seek medical attention if signs/symptoms of bleeding or thromboembolism occur.  Patient verbalized understanding by repeating back information and was advised to contact me if further medication-related questions arise. Patient was also provided an information handout.  Follow-up 1 week  Jacqueline Nguyen

## 2018-03-22 DIAGNOSIS — I509 Heart failure, unspecified: Secondary | ICD-10-CM | POA: Diagnosis not present

## 2018-03-22 DIAGNOSIS — M6281 Muscle weakness (generalized): Secondary | ICD-10-CM | POA: Diagnosis not present

## 2018-03-22 DIAGNOSIS — I110 Hypertensive heart disease with heart failure: Secondary | ICD-10-CM | POA: Diagnosis not present

## 2018-03-22 DIAGNOSIS — J441 Chronic obstructive pulmonary disease with (acute) exacerbation: Secondary | ICD-10-CM | POA: Diagnosis not present

## 2018-03-22 LAB — PROTIME-INR

## 2018-03-27 ENCOUNTER — Encounter: Payer: Self-pay | Admitting: Pharmacist

## 2018-03-27 DIAGNOSIS — Z86718 Personal history of other venous thrombosis and embolism: Secondary | ICD-10-CM

## 2018-03-27 LAB — POCT INR: INR: 2.8 (ref 2.0–3.0)

## 2018-03-29 DIAGNOSIS — G4733 Obstructive sleep apnea (adult) (pediatric): Secondary | ICD-10-CM | POA: Diagnosis not present

## 2018-04-01 ENCOUNTER — Telehealth: Payer: Self-pay

## 2018-04-01 LAB — POCT INR: INR: 2.6 (ref 2.0–3.0)

## 2018-04-01 NOTE — Telephone Encounter (Deleted)
Pt self test INR POC result therapeutic at 2.8 on 44m/week. Counseled pt to continue taking one tablet of her 387mtablets every day. Pt verbalized understanding and does not report any signs or symptoms of bleeding or clotting. Pt states she checks INR Q Monday.  Jacqueline FairlyPharmD PGY1 Pharmacy Resident Phone [phone]/30/2019 9:56 AM

## 2018-04-08 DIAGNOSIS — I480 Paroxysmal atrial fibrillation: Secondary | ICD-10-CM | POA: Diagnosis not present

## 2018-04-08 DIAGNOSIS — Z7901 Long term (current) use of anticoagulants: Secondary | ICD-10-CM | POA: Diagnosis not present

## 2018-04-08 LAB — POCT INR: INR: 3.2 — AB (ref 2.0–3.0)

## 2018-04-09 ENCOUNTER — Telehealth (INDEPENDENT_AMBULATORY_CARE_PROVIDER_SITE_OTHER): Payer: Medicare Other | Admitting: *Deleted

## 2018-04-09 DIAGNOSIS — Z7901 Long term (current) use of anticoagulants: Secondary | ICD-10-CM | POA: Diagnosis not present

## 2018-04-09 DIAGNOSIS — Z5181 Encounter for therapeutic drug level monitoring: Secondary | ICD-10-CM | POA: Diagnosis not present

## 2018-04-09 DIAGNOSIS — Z86711 Personal history of pulmonary embolism: Secondary | ICD-10-CM

## 2018-04-09 DIAGNOSIS — Z86718 Personal history of other venous thrombosis and embolism: Secondary | ICD-10-CM | POA: Diagnosis not present

## 2018-04-09 NOTE — Telephone Encounter (Signed)
Call from Legrand Como, [facility] - yesterday's INR - 3.2.

## 2018-04-09 NOTE — Progress Notes (Signed)
Anticoagulation Management Jacqueline Nguyen a 85 y.o.femalewhois being monitored for warfarin therapy using home INR meter  Indication: DVT and PEhistory, paroxysmal atrial fibrillation Duration: indefinite Supervising physician:[facility]  Anticoagulation Clinic Visit History: Patient reports no signs/symptoms of concern.  Anticoagulation Episode Summary    Current INR goal:   2.0-3.0  TTR:   63.6 % (1.7 y)  Next INR check:   04/15/2018  INR from last check:   3.2! (04/08/2018)  Weekly max warfarin dose:     Target end date:     INR check location:     Preferred lab:     Send INR reminders to:      Indications   History of DVT (deep vein thrombosis) [Z86.718] Anticoagulated on Coumadin [Z79.01] History of pulmonary embolism [Z86.711]       Comments:         Anticoagulation Care Providers    Provider Role Specialty Phone number   Jacqueline Falcon, MD Responsible Internal Medicine [phone]     Allergies  Allergen Reactions  . Ciprofloxacin   . Bactrim Rash  . Codeine Nausea Only   Medication Sig  acetic acid-hydrocortisone (VOSOL-HC) OTIC solution Place 3 drops into the right ear 4 (four) times daily. For 7 days  albuterol (PROVENTIL HFA;VENTOLIN HFA) 108 (90 Base) MCG/ACT inhaler Inhale 1-2 puffs into the lungs daily as needed for shortness of breath or wheezing.  atenolol (TENORMIN) 25 MG tablet Take 1 tablet (25 mg total) by mouth 4 (four) times daily as needed. MAY TAKE 1 TABLET 4 TIMES DAILY AS NEEDED.  AZOPT 1 % ophthalmic suspension Place 1 drop into both eyes 2 (two) times daily.  budesonide-formoterol (SYMBICORT) 160-4.5 MCG/ACT inhaler Inhale 2 puffs into the lungs every 12 (twelve) hours.  COMBIGAN 0.2-0.5 % ophthalmic solution Place 1 drop into both eyes 2 (two) times daily.  diclofenac sodium (VOLTAREN) 1 % GEL Apply 2 g topically 4 (four) times daily as needed.  diltiazem (CARDIZEM CD) 120 MG 24 hr capsule TAKE ONE (1) CAPSULE BY MOUTH 2 TIMES  DAILY  diltiazem (TIAZAC) 120 MG 24 hr capsule TAKE ONE Capsule BY MOUTH TWICE DAILY  FLOVENT HFA 44 MCG/ACT inhaler INHALE 1 PUFF TWICE A DAY DAILY  furosemide (LASIX) 20 MG tablet Take 2 tablets (40 mg total) by mouth daily.  HYDROcodone-acetaminophen (NORCO) 10-325 MG tablet Take 1 tablet by mouth every 6 (six) hours as needed for severe pain.  levalbuterol (XOPENEX) 0.63 MG/3ML nebulizer solution Take 3 mLs (0.63 mg total) by nebulization every 8 (eight) hours as needed for wheezing.  NYAMYC powder APPLY TOPICALLY FOUR TIMES DAILY  potassium chloride (K-DUR) 10 MEQ tablet Take 1 tablet (10 mEq total) by mouth daily as directed.  Respiratory Therapy Supplies (FLUTTER) DEVI Blow through 4 times per set, three sets daily  triamcinolone (KENALOG) 0.1 % paste Use as directed 1 application in the mouth or throat 2 (two) times daily.  warfarin (COUMADIN) 3 MG tablet TAKE ONE TABLET BY MOUTH EVERY DAY   Past Medical History:  Diagnosis Date  . Arthritis   . Cataract   . Chronic respiratory failure ([place])   . Endometriosis   . HTN (hypertension)   . Hx of echocardiogram    Echo (8/15):  Mild LVH, EF 65%, no RWMA, Ao sclerosis, no AS, mod MAC, mild LAE, normal RVSF, PASP 40 mmHg  . Hx pulmonary embolism    on chronic coumadin  . Obesities, morbid ([place])   . OSA (obstructive sleep apnea)   .  Osteoporosis   . PAF (paroxysmal atrial fibrillation) ([place])    Event Monitor (8/15):  Frequent PACs, brief bursts of nonsustained ATach; no AFib  . Psoriasis   . Skin cancer   . Vertigo    Social History   Socioeconomic History  . Marital status: Widowed    Spouse name: Not on file  . Number of children: Not on file  . Years of education: Not on file  . Highest education level: Not on file  Occupational History  . Not on file  Social Needs  . Financial resource strain: Not on file  . Food insecurity:    Worry: Not on file    Inability: Not on file  . Transportation needs:    Medical: Not on  file    Non-medical: Not on file  Tobacco Use  . Smoking status: Former Smoker    Packs/day: 1.00    Years: 2.00    Pack years: 2.00    Types: Cigarettes    Last attempt to quit: 04/03/1981    Years since quitting: 37.0  . Smokeless tobacco: Never Used  Substance and Sexual Activity  . Alcohol use: No  . Drug use: No  . Sexual activity: Not Currently  Lifestyle  . Physical activity:    Days per week: Not on file    Minutes per session: Not on file  . Stress: Not on file  Relationships  . Social connections:    Talks on phone: Not on file    Gets together: Not on file    Attends religious service: Not on file    Active member of club or organization: Not on file    Attends meetings of clubs or organizations: Not on file    Relationship status: Not on file  Other Topics Concern  . Not on file  Social History Narrative   She lives with her sister in [place] and requires assistance at home. Has an aid who visits and helps at home often. Son works as a Catering manager and also visits and helps when he can. Semi-independent in ADLs, requires walker around home, dependent in IADLs.   Family History  Problem Relation Age of Onset  . Heart attack Father   . Heart attack Brother   . Stroke Neg Hx    ASSESSMENT Lab Results  Component Value Date   INR 3.2 (A) 04/08/2018   INR 3.5 (A) 03/18/2018   INR 2.8 03/11/2018   INR today: Supratherapeutic slightly, no change for now  PLAN Weekly dose was unchanged   There are no Patient Instructions on file for this visit. Patient advised to contact clinic or seek medical attention if signs/symptoms of bleeding or thromboembolism occur.  Patient verbalized understanding by repeating back information and was advised to contact me if further medication-related questions arise. Patient was also provided an information handout.  Follow-up 1 week  Jacqueline Nguyen

## 2018-04-10 ENCOUNTER — Encounter: Payer: Medicare Other | Admitting: Internal Medicine

## 2018-04-10 ENCOUNTER — Encounter: Payer: Self-pay | Admitting: Pharmacist

## 2018-04-10 DIAGNOSIS — Z86718 Personal history of other venous thrombosis and embolism: Secondary | ICD-10-CM

## 2018-04-11 LAB — PROTIME-INR

## 2018-04-11 NOTE — Progress Notes (Signed)
INR 2.6, no changes in dose (3 mg daily)

## 2018-04-11 NOTE — Progress Notes (Signed)
INR 2.8, no changes in dose (3 mg daily)

## 2018-04-12 DIAGNOSIS — G4733 Obstructive sleep apnea (adult) (pediatric): Secondary | ICD-10-CM | POA: Diagnosis not present

## 2018-04-15 ENCOUNTER — Telehealth: Payer: Self-pay | Admitting: Pharmacist

## 2018-04-15 ENCOUNTER — Telehealth (INDEPENDENT_AMBULATORY_CARE_PROVIDER_SITE_OTHER): Payer: Medicare Other

## 2018-04-15 DIAGNOSIS — Z86718 Personal history of other venous thrombosis and embolism: Secondary | ICD-10-CM

## 2018-04-15 DIAGNOSIS — Z86711 Personal history of pulmonary embolism: Secondary | ICD-10-CM

## 2018-04-15 DIAGNOSIS — Z7901 Long term (current) use of anticoagulants: Secondary | ICD-10-CM

## 2018-04-15 LAB — POCT INR: INR: 5.1 — AB (ref 2.0–3.0)

## 2018-04-15 MED ORDER — PHYTONADIONE 5 MG PO TABS: 2.5000 mg | ORAL_TABLET | Freq: Once | ORAL | 0 refills | Status: AC

## 2018-04-15 NOTE — Telephone Encounter (Signed)
Pharmacist Danae Chen at [facility] in [place] called r/t RX for Vitamin K.  She states they do not carry Vitamin K and do not stock this drug because they have to order it by the bottle of 100 pills which costs $3K-$5K/bottle.  This nurse also called [facility] in [place] and was informed they also can't get this drug r/t expense.  This nurse called [facility] states they have 5 mg Vit K in stock and cash price is $71.00/tablet.  Will send to Asencion Gowda to advise. Thank you, SChaplin, RN,BSN

## 2018-04-15 NOTE — Progress Notes (Signed)
Anticoagulation Management Jacqueline Nguyen a 85 y.o.femalewhois being monitored for warfarin therapy using home INR meter  Indication: DVT and PEhistory, paroxysmal atrial fibrillation Duration: indefinite Supervising physician:[facility]  Anticoagulation Clinic Visit History: Patient reports no signs/symptoms of concern. Patient's son reports typically preparing meals which include leafy green vegetables consistently. However, he was out of town for the past few days and patient was under the care of other loved ones who provided less green vegetables in her diet. Patient also states she may have inadvertently doubled her warfarin dose at least one day this week.  Anticoagulation Episode Summary    Current INR goal:   2.0-3.0  TTR:   64.8 % (1.7 y)  Next INR check:   04/17/2018  INR from last check:   5.1! (04/15/2018)  Weekly max warfarin dose:     Target end date:     INR check location:     Preferred lab:     Send INR reminders to:      Indications   History of DVT (deep vein thrombosis) [Z86.718] Anticoagulated on Coumadin [Z79.01] History of pulmonary embolism [Z86.711]       Comments:         Anticoagulation Care Providers    Provider Role Specialty Phone number   [name], MD Responsible Internal Medicine [phone]     Allergies  Allergen Reactions  . Ciprofloxacin   . Bactrim Rash  . Codeine Nausea Only   Medication Sig  acetic acid-hydrocortisone (VOSOL-HC) OTIC solution Place 3 drops into the right ear 4 (four) times daily. For 7 days  albuterol (PROVENTIL HFA;VENTOLIN HFA) 108 (90 Base) MCG/ACT inhaler Inhale 1-2 puffs into the lungs daily as needed for shortness of breath or wheezing.  atenolol (TENORMIN) 25 MG tablet Take 1 tablet (25 mg total) by mouth 4 (four) times daily as needed. MAY TAKE 1 TABLET 4 TIMES DAILY AS NEEDED.  AZOPT 1 % ophthalmic suspension Place 1 drop into both eyes 2 (two) times daily.  budesonide-formoterol (SYMBICORT)  160-4.5 MCG/ACT inhaler Inhale 2 puffs into the lungs every 12 (twelve) hours.  COMBIGAN 0.2-0.5 % ophthalmic solution Place 1 drop into both eyes 2 (two) times daily.  diclofenac sodium (VOLTAREN) 1 % GEL Apply 2 g topically 4 (four) times daily as needed.  diltiazem (CARDIZEM CD) 120 MG 24 hr capsule TAKE ONE (1) CAPSULE BY MOUTH 2 TIMES DAILY  diltiazem (TIAZAC) 120 MG 24 hr capsule TAKE ONE Capsule BY MOUTH TWICE DAILY  FLOVENT HFA 44 MCG/ACT inhaler INHALE 1 PUFF TWICE A DAY DAILY  furosemide (LASIX) 20 MG tablet Take 2 tablets (40 mg total) by mouth daily.  HYDROcodone-acetaminophen (NORCO) 10-325 MG tablet Take 1 tablet by mouth every 6 (six) hours as needed for severe pain.  levalbuterol (XOPENEX) 0.63 MG/3ML nebulizer solution Take 3 mLs (0.63 mg total) by nebulization every 8 (eight) hours as needed for wheezing.  NYAMYC powder APPLY TOPICALLY FOUR TIMES DAILY  potassium chloride (K-DUR) 10 MEQ tablet Take 1 tablet (10 mEq total) by mouth daily as directed.  Respiratory Therapy Supplies (FLUTTER) DEVI Blow through 4 times per set, three sets daily  triamcinolone (KENALOG) 0.1 % paste Use as directed 1 application in the mouth or throat 2 (two) times daily.  warfarin (COUMADIN) 3 MG tablet TAKE ONE TABLET BY MOUTH EVERY DAY   Past Medical History:  Diagnosis Date  . Arthritis   . Cataract   . Chronic respiratory failure ([place])   . Endometriosis   .  HTN (hypertension)   . Hx of echocardiogram    Echo (8/15):  Mild LVH, EF 65%, no RWMA, Ao sclerosis, no AS, mod MAC, mild LAE, normal RVSF, PASP 40 mmHg  . Hx pulmonary embolism    on chronic coumadin  . Obesities, morbid ([place])   . OSA (obstructive sleep apnea)   . Osteoporosis   . PAF (paroxysmal atrial fibrillation) ([place])    Event Monitor (8/15):  Frequent PACs, brief bursts of nonsustained ATach; no AFib  . Psoriasis   . Skin cancer   . Vertigo    Social History   Socioeconomic History  . Marital status: Widowed    Spouse  name: Not on file  . Number of children: Not on file  . Years of education: Not on file  . Highest education level: Not on file  Occupational History  . Not on file  Social Needs  . Financial resource strain: Not on file  . Food insecurity:    Worry: Not on file    Inability: Not on file  . Transportation needs:    Medical: Not on file    Non-medical: Not on file  Tobacco Use  . Smoking status: Former Smoker    Packs/day: 1.00    Years: 2.00    Pack years: 2.00    Types: Cigarettes    Last attempt to quit: 04/03/1981    Years since quitting: 37.0  . Smokeless tobacco: Never Used  Substance and Sexual Activity  . Alcohol use: No  . Drug use: No  . Sexual activity: Not Currently  Lifestyle  . Physical activity:    Days per week: Not on file    Minutes per session: Not on file  . Stress: Not on file  Relationships  . Social connections:    Talks on phone: Not on file    Gets together: Not on file    Attends religious service: Not on file    Active member of club or organization: Not on file    Attends meetings of clubs or organizations: Not on file    Relationship status: Not on file  Other Topics Concern  . Not on file  Social History Narrative   She lives with her sister in [place] and requires assistance at home. Has an aid who visits and helps at home often. Son works as a Catering manager and also visits and helps when he can. Semi-independent in ADLs, requires walker around home, dependent in IADLs.   Family History  Problem Relation Age of Onset  . Heart attack Father   . Heart attack Brother   . Stroke Neg Hx    ASSESSMENT Lab Results  Component Value Date   INR 5.1 (A) 04/15/2018   INR 3.2 (A) 04/08/2018   INR 2.6 04/01/2018    INR today: Supratherapeutic  PLAN Discussed with attending physician. Due to concern for bleed risk (HAS-BLED score 4) per CHEST guidelines, will prescribe 1-time dose of vitamin K. Patient and son were instructed to hold  warfarin and re-check INR in 2 days for further instructions, report to emergency care if signs or symptoms of bleeding or thromboembolism occur. They were advised to contact clinic if questions arise. Patient and son verbalized understanding by repeat back.  Follow-up 04/17/18  [name]

## 2018-04-17 LAB — POCT INR: INR: 2 (ref 2.0–3.0)

## 2018-04-17 NOTE — Telephone Encounter (Signed)
Pt's son, [name] stated her INR today is 2.0. I had called the pt but she could not remember but stated she ate "greens" when INR was 5.1.

## 2018-04-17 NOTE — Telephone Encounter (Signed)
Called pt - mailbox is full, unable to leave message. Also called son, [name] - mailbox full also.

## 2018-04-17 NOTE — Telephone Encounter (Signed)
Received call from Legrand Como with [facility]-wanted to give verbal INR result of 5.1 that was obtained on the 1/13. Informed [facility] that office was already aware of the result (see 04/15/18 encounter). No further action needed, phone call complete.   Follow up call made to patient as she was prescribed 1-time dose of vitamin K and instructed to hold warfarin and re-check INR in 2 days (on 1/15).  Per patient-she was unable to obtain the vitamin K, but states she ate "a big bowl of greens".  Pt reports no signs of bleeding and will obtain an INR later today (self-tests). She asks that I call her back in a couple of hours to obtain the results.  States she has company/guests in her home right at this moment and not able to test.  Will have myself or triage call pt back this afternoon (around 2pm) to obtain results and forward to the appropriate physician.  Will also need to confirm if pt held/restarted warfarin .Despina Hidden Cassady1/15/202011:47 AM

## 2018-04-18 LAB — PROTIME-INR

## 2018-04-18 NOTE — Telephone Encounter (Signed)
Thank you Glenda... 

## 2018-04-19 NOTE — Progress Notes (Signed)
Anticoagulation Management Jacqueline Nguyen a 85 y.o.femalewhois being monitored for warfarin therapy using home INR meter  Indication: DVT and PEhistory, paroxysmal atrial fibrillation Duration: indefinite Supervising physician:[facility]  Anticoagulation Clinic Visit History: Patient reports no signs/symptoms of concern. Last week, she and son cited significant dietary changes due to son being out of town. INR 2 days ago was 5.1.  Anticoagulation Episode Summary    Current INR goal:   2.0-3.0  TTR:   64.7 % (1.7 y)  Next INR check:   04/22/2018  INR from last check:   No new INR was available at last check  Most recent INR:    2.0 (04/17/2018)  Weekly max warfarin dose:     Target end date:     INR check location:     Preferred lab:     Send INR reminders to:      Indications   History of DVT (deep vein thrombosis) [Z86.718] Anticoagulated on Coumadin [Z79.01] History of pulmonary embolism [Z86.711]       Comments:         Anticoagulation Care Providers    Provider Role Specialty Phone number   Jacqueline Falcon, MD Responsible Internal Medicine [phone]      Allergies  Allergen Reactions  . Ciprofloxacin   . Bactrim Rash  . Codeine Nausea Only   Medication Sig  acetic acid-hydrocortisone (VOSOL-HC) OTIC solution Place 3 drops into the right ear 4 (four) times daily. For 7 days  albuterol (PROVENTIL HFA;VENTOLIN HFA) 108 (90 Base) MCG/ACT inhaler Inhale 1-2 puffs into the lungs daily as needed for shortness of breath or wheezing.  atenolol (TENORMIN) 25 MG tablet Take 1 tablet (25 mg total) by mouth 4 (four) times daily as needed. MAY TAKE 1 TABLET 4 TIMES DAILY AS NEEDED.  AZOPT 1 % ophthalmic suspension Place 1 drop into both eyes 2 (two) times daily.  budesonide-formoterol (SYMBICORT) 160-4.5 MCG/ACT inhaler Inhale 2 puffs into the lungs every 12 (twelve) hours.  COMBIGAN 0.2-0.5 % ophthalmic solution Place 1 drop into both eyes 2 (two) times daily.   diclofenac sodium (VOLTAREN) 1 % GEL Apply 2 g topically 4 (four) times daily as needed.  diltiazem (CARDIZEM CD) 120 MG 24 hr capsule TAKE ONE (1) CAPSULE BY MOUTH 2 TIMES DAILY  diltiazem (TIAZAC) 120 MG 24 hr capsule TAKE ONE Capsule BY MOUTH TWICE DAILY  FLOVENT HFA 44 MCG/ACT inhaler INHALE 1 PUFF TWICE A DAY DAILY  furosemide (LASIX) 20 MG tablet Take 2 tablets (40 mg total) by mouth daily.  HYDROcodone-acetaminophen (NORCO) 10-325 MG tablet Take 1 tablet by mouth every 6 (six) hours as needed for severe pain.  levalbuterol (XOPENEX) 0.63 MG/3ML nebulizer solution Take 3 mLs (0.63 mg total) by nebulization every 8 (eight) hours as needed for wheezing.  NYAMYC powder APPLY TOPICALLY FOUR TIMES DAILY  potassium chloride (K-DUR) 10 MEQ tablet Take 1 tablet (10 mEq total) by mouth daily as directed.  Respiratory Therapy Supplies (FLUTTER) DEVI Blow through 4 times per set, three sets daily  triamcinolone (KENALOG) 0.1 % paste Use as directed 1 application in the mouth or throat 2 (two) times daily.  warfarin (COUMADIN) 3 MG tablet TAKE ONE TABLET BY MOUTH EVERY DAY   Past Medical History:  Diagnosis Date  . Arthritis   . Cataract   . Chronic respiratory failure ([place])   . Endometriosis   . HTN (hypertension)   . Hx of echocardiogram    Echo (8/15):  Mild LVH, EF 65%, no RWMA,  Ao sclerosis, no AS, mod MAC, mild LAE, normal RVSF, PASP 40 mmHg  . Hx pulmonary embolism    on chronic coumadin  . Obesities, morbid ([place])   . OSA (obstructive sleep apnea)   . Osteoporosis   . PAF (paroxysmal atrial fibrillation) ([place])    Event Monitor (8/15):  Frequent PACs, brief bursts of nonsustained ATach; no AFib  . Psoriasis   . Skin cancer   . Vertigo    Social History   Socioeconomic History  . Marital status: Widowed    Spouse name: Not on file  . Number of children: Not on file  . Years of education: Not on file  . Highest education level: Not on file  Occupational History  . Not on  file  Social Needs  . Financial resource strain: Not on file  . Food insecurity:    Worry: Not on file    Inability: Not on file  . Transportation needs:    Medical: Not on file    Non-medical: Not on file  Tobacco Use  . Smoking status: Former Smoker    Packs/day: 1.00    Years: 2.00    Pack years: 2.00    Types: Cigarettes    Last attempt to quit: 04/03/1981    Years since quitting: 37.0  . Smokeless tobacco: Never Used  Substance and Sexual Activity  . Alcohol use: No  . Drug use: No  . Sexual activity: Not Currently  Lifestyle  . Physical activity:    Days per week: Not on file    Minutes per session: Not on file  . Stress: Not on file  Relationships  . Social connections:    Talks on phone: Not on file    Gets together: Not on file    Attends religious service: Not on file    Active member of club or organization: Not on file    Attends meetings of clubs or organizations: Not on file    Relationship status: Not on file  Other Topics Concern  . Not on file  Social History Narrative   She lives with her sister in [place] and requires assistance at home. Has an aid who visits and helps at home often. Son works as a Catering manager and also visits and helps when he can. Semi-independent in ADLs, requires walker around home, dependent in IADLs.   Family History  Problem Relation Age of Onset  . Heart attack Father   . Heart attack Brother   . Stroke Neg Hx    ASSESSMENT Lab Results  Component Value Date   INR 2.0 04/17/2018   INR 5.1 (A) 04/15/2018   INR 3.2 (A) 04/08/2018   Anticoagulation Dosing: 3 mg (1 tablet) daily   INR today: Therapeutic  PLAN Weekly dose was unchanged, patient counseled on importance of consistent diet.  There are no Patient Instructions on file for this visit. Patient advised to contact clinic or seek medical attention if signs/symptoms of bleeding or thromboembolism occur.  Patient verbalized understanding by repeating back  information and was advised to contact me if further medication-related questions arise. Patient was also provided an information handout.  Follow-up 5 days  Jacqueline Nguyen

## 2018-04-22 ENCOUNTER — Encounter: Payer: Self-pay | Admitting: Pharmacist

## 2018-04-22 ENCOUNTER — Other Ambulatory Visit: Payer: Self-pay | Admitting: Internal Medicine

## 2018-04-22 DIAGNOSIS — Z7901 Long term (current) use of anticoagulants: Secondary | ICD-10-CM

## 2018-04-22 DIAGNOSIS — Z86711 Personal history of pulmonary embolism: Secondary | ICD-10-CM

## 2018-04-22 DIAGNOSIS — Z86718 Personal history of other venous thrombosis and embolism: Secondary | ICD-10-CM

## 2018-04-22 DIAGNOSIS — J449 Chronic obstructive pulmonary disease, unspecified: Secondary | ICD-10-CM

## 2018-04-22 LAB — POCT INR: INR: 2.6 (ref 2.0–3.0)

## 2018-04-23 LAB — PROTIME-INR

## 2018-04-23 NOTE — Progress Notes (Signed)
Anticoagulation Management Jacqueline Nguyen a 85 y.o.femalewhois being monitored for warfarin therapy using home INR meter  Indication: DVT and PEhistory, paroxysmal atrial fibrillation Duration: indefinite Supervising physician:[facility]  Anticoagulation Clinic Visit History: Patient reports no signs/symptoms of concern.  Anticoagulation Episode Summary    Current INR goal:   2.0-3.0  TTR:   65.0 % (1.7 y)  Next INR check:   04/29/2018  INR from last check:   2.6 (04/22/2018)  Weekly max warfarin dose:     Target end date:     INR check location:     Preferred lab:     Send INR reminders to:      Indications   History of DVT (deep vein thrombosis) [Z86.718] Anticoagulated on Coumadin [Z79.01] History of pulmonary embolism [Z86.711]       Comments:         Anticoagulation Care Providers    Provider Role Specialty Phone number   [name], MD Responsible Internal Medicine [phone]     Allergies  Allergen Reactions  . Ciprofloxacin   . Bactrim Rash  . Codeine Nausea Only   Medication Sig  acetic acid-hydrocortisone (VOSOL-HC) OTIC solution Place 3 drops into the right ear 4 (four) times daily. For 7 days  albuterol (PROVENTIL HFA;VENTOLIN HFA) 108 (90 Base) MCG/ACT inhaler Inhale 1-2 puffs into the lungs daily as needed for shortness of breath or wheezing.  atenolol (TENORMIN) 25 MG tablet Take 1 tablet (25 mg total) by mouth 4 (four) times daily as needed. MAY TAKE 1 TABLET 4 TIMES DAILY AS NEEDED.  AZOPT 1 % ophthalmic suspension Place 1 drop into both eyes 2 (two) times daily.  budesonide-formoterol (SYMBICORT) 160-4.5 MCG/ACT inhaler Inhale 2 puffs into the lungs every 12 (twelve) hours.  COMBIGAN 0.2-0.5 % ophthalmic solution Place 1 drop into both eyes 2 (two) times daily.  diclofenac sodium (VOLTAREN) 1 % GEL Apply 2 g topically 4 (four) times daily as needed.  diltiazem (CARDIZEM CD) 120 MG 24 hr capsule TAKE ONE (1) CAPSULE BY MOUTH 2 TIMES  DAILY  diltiazem (TIAZAC) 120 MG 24 hr capsule TAKE ONE Capsule BY MOUTH TWICE DAILY  FLOVENT HFA 44 MCG/ACT inhaler INHALE 1 PUFF TWICE A DAY DAILY  furosemide (LASIX) 20 MG tablet Take 2 tablets (40 mg total) by mouth daily.  HYDROcodone-acetaminophen (NORCO) 10-325 MG tablet Take 1 tablet by mouth every 6 (six) hours as needed for severe pain.  levalbuterol (XOPENEX) 0.63 MG/3ML nebulizer solution Take 3 mLs (0.63 mg total) by nebulization every 8 (eight) hours as needed for wheezing.  NYAMYC powder APPLY TOPICALLY FOUR TIMES DAILY  potassium chloride (K-DUR) 10 MEQ tablet Take 1 tablet (10 mEq total) by mouth daily as directed.  Respiratory Therapy Supplies (FLUTTER) DEVI Blow through 4 times per set, three sets daily  triamcinolone (KENALOG) 0.1 % paste Use as directed 1 application in the mouth or throat 2 (two) times daily.  warfarin (COUMADIN) 3 MG tablet TAKE ONE TABLET BY MOUTH EVERY DAY   Past Medical History:  Diagnosis Date  . Arthritis   . Cataract   . Chronic respiratory failure ([place])   . Endometriosis   . HTN (hypertension)   . Hx of echocardiogram    Echo (8/15):  Mild LVH, EF 65%, no RWMA, Ao sclerosis, no AS, mod MAC, mild LAE, normal RVSF, PASP 40 mmHg  . Hx pulmonary embolism    on chronic coumadin  . Obesities, morbid ([place])   . OSA (obstructive sleep apnea)   .  Osteoporosis   . PAF (paroxysmal atrial fibrillation) ([place])    Event Monitor (8/15):  Frequent PACs, brief bursts of nonsustained ATach; no AFib  . Psoriasis   . Skin cancer   . Vertigo    Social History   Socioeconomic History  . Marital status: Widowed    Spouse name: Not on file  . Number of children: Not on file  . Years of education: Not on file  . Highest education level: Not on file  Occupational History  . Not on file  Social Needs  . Financial resource strain: Not on file  . Food insecurity:    Worry: Not on file    Inability: Not on file  . Transportation needs:    Medical: Not on  file    Non-medical: Not on file  Tobacco Use  . Smoking status: Former Smoker    Packs/day: 1.00    Years: 2.00    Pack years: 2.00    Types: Cigarettes    Last attempt to quit: 04/03/1981    Years since quitting: 37.0  . Smokeless tobacco: Never Used  Substance and Sexual Activity  . Alcohol use: No  . Drug use: No  . Sexual activity: Not Currently  Lifestyle  . Physical activity:    Days per week: Not on file    Minutes per session: Not on file  . Stress: Not on file  Relationships  . Social connections:    Talks on phone: Not on file    Gets together: Not on file    Attends religious service: Not on file    Active member of club or organization: Not on file    Attends meetings of clubs or organizations: Not on file    Relationship status: Not on file  Other Topics Concern  . Not on file  Social History Narrative   She lives with her sister in [place] and requires assistance at home. Has an aid who visits and helps at home often. Son works as a Catering manager and also visits and helps when he can. Semi-independent in ADLs, requires walker around home, dependent in IADLs.   Family History  Problem Relation Age of Onset  . Heart attack Father   . Heart attack Brother   . Stroke Neg Hx    ASSESSMENT Lab Results  Component Value Date   INR 2.6 04/22/2018   INR 2.0 04/17/2018   INR 5.1 (A) 04/15/2018   Anticoagulation Dosing: 3 mg daily   INR today: Therapeutic  PLAN Weekly dose was unchanged   There are no Patient Instructions on file for this visit. Patient advised to contact clinic or seek medical attention if signs/symptoms of bleeding or thromboembolism occur.  Patient verbalized understanding by repeating back information and was advised to contact me if further medication-related questions arise. Patient was also provided an information handout.  Follow-up 1 week  [name]

## 2018-04-26 ENCOUNTER — Telehealth: Payer: Self-pay

## 2018-04-26 ENCOUNTER — Other Ambulatory Visit: Payer: Self-pay | Admitting: Internal Medicine

## 2018-04-26 MED ORDER — HYDROCODONE-ACETAMINOPHEN 10-325 MG PO TABS: 1.0000 | ORAL_TABLET | Freq: Four times a day (QID) | ORAL | 0 refills | Status: DC | PRN

## 2018-04-26 NOTE — Telephone Encounter (Signed)
Spoke with patient-wanted to make sure pcp was aware of her refill requests.  Also wanted to make an appt-pt given appt with pcp on 03/04 _0 :45am..Jacqueline Larock Cassady1/24/20204:08 PM

## 2018-04-26 NOTE — Telephone Encounter (Signed)
Bonnita Nasuti please call pt, she said its urgent; [phone]

## 2018-04-26 NOTE — Telephone Encounter (Signed)
Pt would like to speak with a nurse about home health nurse visit. Please call pt back.

## 2018-04-26 NOTE — Telephone Encounter (Signed)
HYDROcodone-acetaminophen [facility]) 10-325 MG tablet   Refill request @  [facility] - [address] - [place], [place] - [address] [phone] (Phone) [phone] (Fax)

## 2018-04-29 DIAGNOSIS — G4733 Obstructive sleep apnea (adult) (pediatric): Secondary | ICD-10-CM | POA: Diagnosis not present

## 2018-04-29 LAB — POCT INR: INR: 4.1 — AB (ref 2.0–3.0)

## 2018-04-30 ENCOUNTER — Encounter: Payer: Self-pay | Admitting: Pharmacist

## 2018-04-30 DIAGNOSIS — M6281 Muscle weakness (generalized): Secondary | ICD-10-CM | POA: Diagnosis not present

## 2018-04-30 DIAGNOSIS — Z86711 Personal history of pulmonary embolism: Secondary | ICD-10-CM

## 2018-04-30 DIAGNOSIS — J441 Chronic obstructive pulmonary disease with (acute) exacerbation: Secondary | ICD-10-CM | POA: Diagnosis not present

## 2018-04-30 DIAGNOSIS — Z7901 Long term (current) use of anticoagulants: Secondary | ICD-10-CM

## 2018-04-30 DIAGNOSIS — R21 Rash and other nonspecific skin eruption: Secondary | ICD-10-CM | POA: Diagnosis not present

## 2018-04-30 DIAGNOSIS — I509 Heart failure, unspecified: Secondary | ICD-10-CM | POA: Diagnosis not present

## 2018-04-30 DIAGNOSIS — Z86718 Personal history of other venous thrombosis and embolism: Secondary | ICD-10-CM

## 2018-04-30 NOTE — Progress Notes (Addendum)
Anticoagulation Management Jacqueline Nguyen a 85 y.o.femalewhois being monitored for warfarin therapy using home INR meter  Indication: DVT and PEhistory, paroxysmal atrial fibrillation Duration: indefinite Supervising physician:[facility]  Anticoagulation Clinic Visit History: Patient reports no signs/symptoms of concern.  Anticoagulation Episode Summary    Current INR goal:   2.0-3.0  TTR:   64.6 % (1.8 y)  Next INR check:   05/07/2018  INR from last check:   4.1! (04/29/2018)  Weekly max warfarin dose:     Target end date:     INR check location:     Preferred lab:     Send INR reminders to:      Indications   History of DVT (deep vein thrombosis) [Z86.718] Anticoagulated on Coumadin [Z79.01] History of pulmonary embolism [Z86.711]       Comments:         Anticoagulation Care Providers    Provider Role Specialty Phone number   [name], MD Responsible Internal Medicine [phone]     Allergies  Allergen Reactions  . Ciprofloxacin   . Bactrim Rash  . Codeine Nausea Only   Medication Sig  acetic acid-hydrocortisone (VOSOL-HC) OTIC solution Place 3 drops into the right ear 4 (four) times daily. For 7 days  atenolol (TENORMIN) 25 MG tablet TAKE ONE TABLET BY MOUTH FOUR TIMES DAILY  AZOPT 1 % ophthalmic suspension Place 1 drop into both eyes 2 (two) times daily.  budesonide-formoterol (SYMBICORT) 160-4.5 MCG/ACT inhaler Inhale 2 puffs into the lungs every 12 (twelve) hours.  COMBIGAN 0.2-0.5 % ophthalmic solution Place 1 drop into both eyes 2 (two) times daily.  diclofenac sodium (VOLTAREN) 1 % GEL Apply 2 g topically 4 (four) times daily as needed.  diltiazem (CARDIZEM CD) 120 MG 24 hr capsule TAKE ONE (1) CAPSULE BY MOUTH 2 TIMES DAILY  diltiazem (TIAZAC) 120 MG 24 hr capsule TAKE ONE Capsule BY MOUTH TWICE DAILY  FLOVENT HFA 44 MCG/ACT inhaler INHALE 1 PUFF TWICE A DAY DAILY  furosemide (LASIX) 20 MG tablet Take 2 tablets (40 mg total) by mouth daily.   HYDROcodone-acetaminophen (NORCO) 10-325 MG tablet Take 1 tablet by mouth every 6 (six) hours as needed for severe pain.  levalbuterol (XOPENEX) 0.63 MG/3ML nebulizer solution Take 3 mLs (0.63 mg total) by nebulization every 8 (eight) hours as needed for wheezing.  NYAMYC powder APPLY TOPICALLY FOUR TIMES DAILY  potassium chloride (K-DUR) 10 MEQ tablet Take 1 tablet (10 mEq total) by mouth daily as directed.  PROAIR HFA 108 (90 Base) MCG/ACT inhaler INHALE 1-2 PUFFS INTO THE LUNGS DAILY AS NEEDED FOR SHORTNESS OF BREATH OR WHEEZING  Respiratory Therapy Supplies (FLUTTER) DEVI Blow through 4 times per set, three sets daily  triamcinolone (KENALOG) 0.1 % paste Use as directed 1 application in the mouth or throat 2 (two) times daily.  warfarin (COUMADIN) 3 MG tablet TAKE ONE TABLET BY MOUTH EVERY DAY   Past Medical History:  Diagnosis Date  . Arthritis   . Cataract   . Chronic respiratory failure ([place])   . Endometriosis   . HTN (hypertension)   . Hx of echocardiogram    Echo (8/15):  Mild LVH, EF 65%, no RWMA, Ao sclerosis, no AS, mod MAC, mild LAE, normal RVSF, PASP 40 mmHg  . Hx pulmonary embolism    on chronic coumadin  . Obesities, morbid ([place])   . OSA (obstructive sleep apnea)   . Osteoporosis   . PAF (paroxysmal atrial fibrillation) ([place])    Event Monitor (8/15):  Frequent PACs, brief bursts of nonsustained ATach; no AFib  . Psoriasis   . Skin cancer   . Vertigo    Social History   Socioeconomic History  . Marital status: Widowed    Spouse name: Not on file  . Number of children: Not on file  . Years of education: Not on file  . Highest education level: Not on file  Occupational History  . Not on file  Social Needs  . Financial resource strain: Not on file  . Food insecurity:    Worry: Not on file    Inability: Not on file  . Transportation needs:    Medical: Not on file    Non-medical: Not on file  Tobacco Use  . Smoking status: Former Smoker    Packs/day: 1.00     Years: 2.00    Pack years: 2.00    Types: Cigarettes    Last attempt to quit: 04/03/1981    Years since quitting: 37.1  . Smokeless tobacco: Never Used  Substance and Sexual Activity  . Alcohol use: No  . Drug use: No  . Sexual activity: Not Currently  Lifestyle  . Physical activity:    Days per week: Not on file    Minutes per session: Not on file  . Stress: Not on file  Relationships  . Social connections:    Talks on phone: Not on file    Gets together: Not on file    Attends religious service: Not on file    Active member of club or organization: Not on file    Attends meetings of clubs or organizations: Not on file    Relationship status: Not on file  Other Topics Concern  . Not on file  Social History Narrative   She lives with her sister in [place] and requires assistance at home. Has an aid who visits and helps at home often. Son works as a Catering manager and also visits and helps when he can. Semi-independent in ADLs, requires walker around home, dependent in IADLs.   Family History  Problem Relation Age of Onset  . Heart attack Father   . Heart attack Brother   . Stroke Neg Hx    ASSESSMENT Lab Results  Component Value Date   INR 4.1 (A) 04/29/2018   INR 2.6 04/22/2018   INR 2.0 04/17/2018   Anticoagulation Dosing: 3 mg daily   INR today: Supratherapeutic  PLAN Take 1/2 tablet x 1 dose, then resume 1 tablet daily, re-check in 1 week.  There are no Patient Instructions on file for this visit. Patient advised to contact clinic or seek medical attention if signs/symptoms of bleeding or thromboembolism occur.  Patient verbalized understanding by repeating back information and was advised to contact me if further medication-related questions arise. Patient was also provided an information handout.  Follow-up 1 week  [name]

## 2018-04-30 NOTE — Telephone Encounter (Signed)
Returned call to patient. States she has a wound on her bottom and would like a nurse to come look at it. Explained she would need an OV for this. Scheduled patient with PCP tomorrow. She will call back to r/s in [facility] if unable to keep this appt due to transportation issues (son is out of town). Hubbard Hartshorn, RN, BSN

## 2018-05-01 ENCOUNTER — Encounter: Payer: Medicare Other | Admitting: Internal Medicine

## 2018-05-01 ENCOUNTER — Encounter: Payer: Self-pay | Admitting: Internal Medicine

## 2018-05-01 NOTE — Telephone Encounter (Signed)
She did not present to clinic this morning.  I would also advise her to call her home health group and see if they can send a nurse out?

## 2018-05-03 ENCOUNTER — Telehealth: Payer: Self-pay | Admitting: Internal Medicine

## 2018-05-03 NOTE — Telephone Encounter (Signed)
Talked to [facility] - stated pt does not have a wound on her bottom but a rash and some redness,instructed to use her nystatin powder.

## 2018-05-03 NOTE — Telephone Encounter (Signed)
Jacqueline Nguyen is calling back [phone] from [place]

## 2018-05-03 NOTE — Telephone Encounter (Signed)
Talked to pt's son, [name] - stated Kerrie Buffalo from [facility] saw pt  2 days ago; her telephone# is [phone]. [name] stated pt is doing very well. I called [facility] for an update - unavailable; left message if she would  return my call.

## 2018-05-06 ENCOUNTER — Telehealth (INDEPENDENT_AMBULATORY_CARE_PROVIDER_SITE_OTHER): Payer: Medicare Other | Admitting: *Deleted

## 2018-05-06 DIAGNOSIS — Z7901 Long term (current) use of anticoagulants: Secondary | ICD-10-CM | POA: Diagnosis not present

## 2018-05-06 DIAGNOSIS — Z86718 Personal history of other venous thrombosis and embolism: Secondary | ICD-10-CM | POA: Diagnosis not present

## 2018-05-06 DIAGNOSIS — Z86711 Personal history of pulmonary embolism: Secondary | ICD-10-CM | POA: Diagnosis not present

## 2018-05-06 DIAGNOSIS — I480 Paroxysmal atrial fibrillation: Secondary | ICD-10-CM | POA: Diagnosis not present

## 2018-05-06 LAB — POCT INR: INR: 3.5 — AB (ref 2.0–3.0)

## 2018-05-06 LAB — PROTIME-INR

## 2018-05-06 NOTE — Telephone Encounter (Signed)
INR from [facility]  05/06/2018 at 3:22pm   INR= 3.5 Will give paper copy to dr Maudie Mercury Sending to dr's Maudie Mercury and Daryll Drown

## 2018-05-07 NOTE — Progress Notes (Signed)
Anticoagulation Management Jacqueline Nguyen a 85 y.o.femalewhois being monitored for warfarin therapy using home INR meter  Indication: DVT and PEhistory, paroxysmal atrial fibrillation Duration: indefinite Supervising physician:Emily [facility]  Anticoagulation Clinic Visit History: Patient reports no signs/symptoms of concern.  Anticoagulation Episode Summary    Current INR goal:   2.0-3.0  TTR:   63.9 % (1.8 y)  Next INR check:   05/07/2018  INR from last check:   3.5! (05/06/2018)  Weekly max warfarin dose:     Target end date:     INR check location:     Preferred lab:     Send INR reminders to:      Indications   History of DVT (deep vein thrombosis) [Z86.718] Anticoagulated on Coumadin [Z79.01] History of pulmonary embolism [Z86.711]       Comments:         Anticoagulation Care Providers    Provider Role Specialty Phone number   [name], MD Responsible Internal Medicine [phone]      Allergies  Allergen Reactions  . Ciprofloxacin   . Bactrim Rash  . Codeine Nausea Only   Medication Sig  acetic acid-hydrocortisone (VOSOL-HC) OTIC solution Place 3 drops into the right ear 4 (four) times daily. For 7 days  atenolol (TENORMIN) 25 MG tablet TAKE ONE TABLET BY MOUTH FOUR TIMES DAILY  AZOPT 1 % ophthalmic suspension Place 1 drop into both eyes 2 (two) times daily.  budesonide-formoterol (SYMBICORT) 160-4.5 MCG/ACT inhaler Inhale 2 puffs into the lungs every 12 (twelve) hours.  COMBIGAN 0.2-0.5 % ophthalmic solution Place 1 drop into both eyes 2 (two) times daily.  diclofenac sodium (VOLTAREN) 1 % GEL Apply 2 g topically 4 (four) times daily as needed.  diltiazem (CARDIZEM CD) 120 MG 24 hr capsule TAKE ONE (1) CAPSULE BY MOUTH 2 TIMES DAILY  diltiazem (TIAZAC) 120 MG 24 hr capsule TAKE ONE Capsule BY MOUTH TWICE DAILY  FLOVENT HFA 44 MCG/ACT inhaler INHALE 1 PUFF TWICE A DAY DAILY  furosemide (LASIX) 20 MG tablet Take 2 tablets (40 mg total) by mouth daily.   HYDROcodone-acetaminophen (NORCO) 10-325 MG tablet Take 1 tablet by mouth every 6 (six) hours as needed for severe pain.  levalbuterol (XOPENEX) 0.63 MG/3ML nebulizer solution Take 3 mLs (0.63 mg total) by nebulization every 8 (eight) hours as needed for wheezing.  NYAMYC powder APPLY TOPICALLY FOUR TIMES DAILY  potassium chloride (K-DUR) 10 MEQ tablet Take 1 tablet (10 mEq total) by mouth daily as directed.  PROAIR HFA 108 (90 Base) MCG/ACT inhaler INHALE 1-2 PUFFS INTO THE LUNGS DAILY AS NEEDED FOR SHORTNESS OF BREATH OR WHEEZING  Respiratory Therapy Supplies (FLUTTER) DEVI Blow through 4 times per set, three sets daily  triamcinolone (KENALOG) 0.1 % paste Use as directed 1 application in the mouth or throat 2 (two) times daily.  warfarin (COUMADIN) 3 MG tablet TAKE ONE TABLET BY MOUTH EVERY DAY   Past Medical History:  Diagnosis Date  . Arthritis   . Cataract   . Chronic respiratory failure ([place])   . Endometriosis   . HTN (hypertension)   . Hx of echocardiogram    Echo (8/15):  Mild LVH, EF 65%, no RWMA, Ao sclerosis, no AS, mod MAC, mild LAE, normal RVSF, PASP 40 mmHg  . Hx pulmonary embolism    on chronic coumadin  . Obesities, morbid ([place])   . OSA (obstructive sleep apnea)   . Osteoporosis   . PAF (paroxysmal atrial fibrillation) ([place])    Event Monitor (8/15):  Frequent PACs, brief bursts of nonsustained ATach; no AFib  . Psoriasis   . Skin cancer   . Vertigo    Social History   Socioeconomic History  . Marital status: Widowed    Spouse name: Not on file  . Number of children: Not on file  . Years of education: Not on file  . Highest education level: Not on file  Occupational History  . Not on file  Social Needs  . Financial resource strain: Not on file  . Food insecurity:    Worry: Not on file    Inability: Not on file  . Transportation needs:    Medical: Not on file    Non-medical: Not on file  Tobacco Use  . Smoking status: Former Smoker    Packs/day: 1.00     Years: 2.00    Pack years: 2.00    Types: Cigarettes    Last attempt to quit: 04/03/1981    Years since quitting: 37.1  . Smokeless tobacco: Never Used  Substance and Sexual Activity  . Alcohol use: No  . Drug use: No  . Sexual activity: Not Currently  Lifestyle  . Physical activity:    Days per week: Not on file    Minutes per session: Not on file  . Stress: Not on file  Relationships  . Social connections:    Talks on phone: Not on file    Gets together: Not on file    Attends religious service: Not on file    Active member of club or organization: Not on file    Attends meetings of clubs or organizations: Not on file    Relationship status: Not on file  Other Topics Concern  . Not on file  Social History Narrative   She lives with her sister in [place] and requires assistance at home. Has an aid who visits and helps at home often. Son works as a Catering manager and also visits and helps when he can. Semi-independent in ADLs, requires walker around home, dependent in IADLs.   Family History  Problem Relation Age of Onset  . Heart attack Father   . Heart attack Brother   . Stroke Neg Hx     ASSESSMENT Recent Results: Lab Results  Component Value Date   INR 3.5 (A) 05/06/2018   INR 4.1 (A) 04/29/2018   INR 2.6 04/22/2018   Anticoagulation Dosing: 1.5 mg (1/2 tablet Tuesdays), 3 mg (1 tablet) all other days   INR today: supratherapeutic  PLAN Spoke to patient and left message with her son to take 1/2 tablet today, and then 1 tablet daily, re-check INR next week.  There are no Patient Instructions on file for this visit. Patient advised to contact clinic or seek medical attention if signs/symptoms of bleeding or thromboembolism occur.  Patient verbalized understanding by repeating back information and was advised to contact me if further medication-related questions arise. Patient was also provided an information handout.  Follow-up 1 week  [name]

## 2018-05-07 NOTE — Telephone Encounter (Signed)
Thank you Glenda... 

## 2018-05-11 ENCOUNTER — Other Ambulatory Visit: Payer: Self-pay | Admitting: Internal Medicine

## 2018-05-11 DIAGNOSIS — J449 Chronic obstructive pulmonary disease, unspecified: Secondary | ICD-10-CM

## 2018-05-13 ENCOUNTER — Telehealth (INDEPENDENT_AMBULATORY_CARE_PROVIDER_SITE_OTHER): Payer: Medicare Other | Admitting: *Deleted

## 2018-05-13 DIAGNOSIS — Z86718 Personal history of other venous thrombosis and embolism: Secondary | ICD-10-CM

## 2018-05-13 DIAGNOSIS — Z7901 Long term (current) use of anticoagulants: Secondary | ICD-10-CM | POA: Diagnosis not present

## 2018-05-13 DIAGNOSIS — Z5181 Encounter for therapeutic drug level monitoring: Secondary | ICD-10-CM

## 2018-05-13 DIAGNOSIS — Z86711 Personal history of pulmonary embolism: Secondary | ICD-10-CM | POA: Diagnosis not present

## 2018-05-13 DIAGNOSIS — G4733 Obstructive sleep apnea (adult) (pediatric): Secondary | ICD-10-CM | POA: Diagnosis not present

## 2018-05-13 LAB — POCT INR: INR: 3.6 — AB (ref 2.0–3.0)

## 2018-05-13 NOTE — Telephone Encounter (Signed)
Received faxed results from [facility] for patient's PT/INR 01/07/2018 to 05/06/2018. Placed in Dr. Julianne Rice office. Hubbard Hartshorn, RN, BSN

## 2018-05-14 LAB — PROTIME-INR

## 2018-05-14 NOTE — Telephone Encounter (Signed)
Anticoagulation Management Jacqueline Nguyen a 85 y.o.femalewhois being monitored for warfarin therapy using home INR meter  Indication: DVT and PEhistory, paroxysmal atrial fibrillation Duration: indefinite Supervising physician:Jacqueline Nguyen  Anticoagulation Clinic Visit History: Patient reports no signs/symptoms of concern.  Allergies  Allergen Reactions  . Ciprofloxacin   . Bactrim Rash  . Codeine Nausea Only   Medication Sig  acetic acid-hydrocortisone (VOSOL-HC) OTIC solution Place 3 drops into the right ear 4 (four) times daily. For 7 days  atenolol (TENORMIN) 25 MG tablet TAKE ONE TABLET BY MOUTH FOUR TIMES DAILY  AZOPT 1 % ophthalmic suspension Place 1 drop into both eyes 2 (two) times daily.  COMBIGAN 0.2-0.5 % ophthalmic solution Place 1 drop into both eyes 2 (two) times daily.  diclofenac sodium (VOLTAREN) 1 % GEL Apply 2 g topically 4 (four) times daily as needed.  diltiazem (CARDIZEM CD) 120 MG 24 hr capsule TAKE ONE (1) CAPSULE BY MOUTH 2 TIMES DAILY  diltiazem (TIAZAC) 120 MG 24 hr capsule TAKE ONE Capsule BY MOUTH TWICE DAILY  FLOVENT HFA 44 MCG/ACT inhaler INHALE 1 PUFF TWICE A DAY DAILY  furosemide (LASIX) 20 MG tablet Take 2 tablets (40 mg total) by mouth daily.  HYDROcodone-acetaminophen (NORCO) 10-325 MG tablet Take 1 tablet by mouth every 6 (six) hours as needed for severe pain.  levalbuterol (XOPENEX) 0.63 MG/3ML nebulizer solution Take 3 mLs (0.63 mg total) by nebulization every 8 (eight) hours as needed for wheezing.  NYAMYC powder APPLY TOPICALLY FOUR TIMES DAILY  potassium chloride (K-DUR) 10 MEQ tablet Take 1 tablet (10 mEq total) by mouth daily as directed.  PROAIR HFA 108 (90 Base) MCG/ACT inhaler INHALE 1-2 PUFFS INTO THE LUNGS DAILY AS NEEDED FOR SHORTNESS OF BREATH OR WHEEZING  Respiratory Therapy Supplies (FLUTTER) DEVI Blow through 4 times per set, three sets daily  SYMBICORT 160-4.5 MCG/ACT inhaler INHALE TWO PUFFS EVERY 12 HOURS  triamcinolone  (KENALOG) 0.1 % paste Use as directed 1 application in the mouth or throat 2 (two) times daily.  warfarin (COUMADIN) 3 MG tablet TAKE ONE TABLET BY MOUTH EVERY DAY   Past Medical History:  Diagnosis Date  . Arthritis   . Cataract   . Chronic respiratory failure ([place])   . Endometriosis   . HTN (hypertension)   . Hx of echocardiogram    Echo (8/15):  Mild LVH, EF 65%, no RWMA, Ao sclerosis, no AS, mod MAC, mild LAE, normal RVSF, PASP 40 mmHg  . Hx pulmonary embolism    on chronic coumadin  . Obesities, morbid ([place])   . OSA (obstructive sleep apnea)   . Osteoporosis   . PAF (paroxysmal atrial fibrillation) ([place])    Event Monitor (8/15):  Frequent PACs, brief bursts of nonsustained ATach; no AFib  . Psoriasis   . Skin cancer   . Vertigo    Social History   Socioeconomic History  . Marital status: Widowed    Spouse name: Not on file  . Number of children: Not on file  . Years of education: Not on file  . Highest education level: Not on file  Occupational History  . Not on file  Social Needs  . Financial resource strain: Not on file  . Food insecurity:    Worry: Not on file    Inability: Not on file  . Transportation needs:    Medical: Not on file    Non-medical: Not on file  Tobacco Use  . Smoking status: Former Smoker    Packs/day: 1.00  Years: 2.00    Pack years: 2.00    Types: Cigarettes    Last attempt to quit: 04/03/1981    Years since quitting: 37.1  . Smokeless tobacco: Never Used  Substance and Sexual Activity  . Alcohol use: No  . Drug use: No  . Sexual activity: Not Currently  Lifestyle  . Physical activity:    Days per week: Not on file    Minutes per session: Not on file  . Stress: Not on file  Relationships  . Social connections:    Talks on phone: Not on file    Gets together: Not on file    Attends religious service: Not on file    Active member of club or organization: Not on file    Attends meetings of clubs or organizations: Not on file     Relationship status: Not on file  Other Topics Concern  . Not on file  Social History Narrative   She lives with her sister in [place] and requires assistance at home. Has an aid who visits and helps at home often. Son works as a Catering manager and also visits and helps when he can. Semi-independent in ADLs, requires walker around home, dependent in IADLs.   Family History  Problem Relation Age of Onset  . Heart attack Father   . Heart attack Brother   . Stroke Neg Hx    ASSESSMENT Lab Results  Component Value Date   INR 3.6 (A) 05/13/2018   INR 3.5 (A) 05/06/2018   INR 4.1 (A) 04/29/2018   Anticoagulation Dosing:   1.5 mg every Mon, Wed; 3 mg all other days   INR today: Supratherapeutic  PLAN Weekly dose was decreased by 20% to 18 mg per week  There are no Patient Instructions on file for this visit. Patient advised to contact clinic or seek medical attention if signs/symptoms of bleeding or thromboembolism occur.  Patient verbalized understanding by repeating back information and was advised to contact me if further medication-related questions arise. Patient was also provided an information handout.  Follow-up 1 week Jacqueline Nguyen

## 2018-05-14 NOTE — Telephone Encounter (Signed)
[facility] calls 3.6 INR 2/10

## 2018-05-14 NOTE — Telephone Encounter (Signed)
Reviewed Dr. Julianne Rice note and agree with plan.

## 2018-05-20 ENCOUNTER — Encounter: Payer: Self-pay | Admitting: Pharmacist

## 2018-05-20 DIAGNOSIS — Z86718 Personal history of other venous thrombosis and embolism: Secondary | ICD-10-CM

## 2018-05-20 DIAGNOSIS — I480 Paroxysmal atrial fibrillation: Secondary | ICD-10-CM

## 2018-05-20 DIAGNOSIS — Z86711 Personal history of pulmonary embolism: Secondary | ICD-10-CM

## 2018-05-20 LAB — POCT INR: INR: 3.6 — AB (ref 2.0–3.0)

## 2018-05-21 DIAGNOSIS — D229 Melanocytic nevi, unspecified: Secondary | ICD-10-CM | POA: Diagnosis not present

## 2018-05-21 DIAGNOSIS — H903 Sensorineural hearing loss, bilateral: Secondary | ICD-10-CM | POA: Diagnosis not present

## 2018-05-21 DIAGNOSIS — L570 Actinic keratosis: Secondary | ICD-10-CM | POA: Diagnosis not present

## 2018-05-21 DIAGNOSIS — Z1283 Encounter for screening for malignant neoplasm of skin: Secondary | ICD-10-CM | POA: Diagnosis not present

## 2018-05-21 DIAGNOSIS — L578 Other skin changes due to chronic exposure to nonionizing radiation: Secondary | ICD-10-CM | POA: Diagnosis not present

## 2018-05-21 DIAGNOSIS — L821 Other seborrheic keratosis: Secondary | ICD-10-CM | POA: Diagnosis not present

## 2018-05-21 DIAGNOSIS — D485 Neoplasm of uncertain behavior of skin: Secondary | ICD-10-CM | POA: Diagnosis not present

## 2018-05-21 DIAGNOSIS — Z85828 Personal history of other malignant neoplasm of skin: Secondary | ICD-10-CM | POA: Diagnosis not present

## 2018-05-21 DIAGNOSIS — L905 Scar conditions and fibrosis of skin: Secondary | ICD-10-CM | POA: Diagnosis not present

## 2018-05-21 NOTE — Progress Notes (Signed)
Anticoagulation Management Colleena Kurtenbach a 85 y.o.femalewhois being monitored for warfarin therapy using home INR meter  Indication: DVT and PEhistory, paroxysmal atrial fibrillation Duration: indefinite Supervising physician:Emily [facility]  Anticoagulation Clinic Visit History: Patient reports no signs/symptoms of concern.  Anticoagulation Episode Summary    Current INR goal:   2.0-3.0  TTR:   62.5 % (1.8 y)  Next INR check:   05/14/2018  INR from last check:   3.6! (05/13/2018)  Most recent INR:    3.6! (05/20/2018)  Weekly max warfarin dose:     Target end date:     INR check location:     Preferred lab:     Send INR reminders to:      Indications   History of DVT (deep vein thrombosis) [Z86.718] Anticoagulated on Coumadin [Z79.01] History of pulmonary embolism [Z86.711]       Comments:         Anticoagulation Care Providers    Provider Role Specialty Phone number   [name], MD Responsible Internal Medicine [phone]     Allergies  Allergen Reactions  . Ciprofloxacin   . Bactrim Rash  . Codeine Nausea Only   Medication Sig  acetic acid-hydrocortisone (VOSOL-HC) OTIC solution Place 3 drops into the right ear 4 (four) times daily. For 7 days  atenolol (TENORMIN) 25 MG tablet TAKE ONE TABLET BY MOUTH FOUR TIMES DAILY  AZOPT 1 % ophthalmic suspension Place 1 drop into both eyes 2 (two) times daily.  COMBIGAN 0.2-0.5 % ophthalmic solution Place 1 drop into both eyes 2 (two) times daily.  diclofenac sodium (VOLTAREN) 1 % GEL Apply 2 g topically 4 (four) times daily as needed.  diltiazem (CARDIZEM CD) 120 MG 24 hr capsule TAKE ONE (1) CAPSULE BY MOUTH 2 TIMES DAILY  diltiazem (TIAZAC) 120 MG 24 hr capsule TAKE ONE Capsule BY MOUTH TWICE DAILY  FLOVENT HFA 44 MCG/ACT inhaler INHALE 1 PUFF TWICE A DAY DAILY  furosemide (LASIX) 20 MG tablet Take 2 tablets (40 mg total) by mouth daily.  HYDROcodone-acetaminophen (NORCO) 10-325 MG tablet Take 1 tablet by mouth  every 6 (six) hours as needed for severe pain.  levalbuterol (XOPENEX) 0.63 MG/3ML nebulizer solution Take 3 mLs (0.63 mg total) by nebulization every 8 (eight) hours as needed for wheezing.  NYAMYC powder APPLY TOPICALLY FOUR TIMES DAILY  potassium chloride (K-DUR) 10 MEQ tablet Take 1 tablet (10 mEq total) by mouth daily as directed.  PROAIR HFA 108 (90 Base) MCG/ACT inhaler INHALE 1-2 PUFFS INTO THE LUNGS DAILY AS NEEDED FOR SHORTNESS OF BREATH OR WHEEZING  Respiratory Therapy Supplies (FLUTTER) DEVI Blow through 4 times per set, three sets daily  SYMBICORT 160-4.5 MCG/ACT inhaler INHALE TWO PUFFS EVERY 12 HOURS  triamcinolone (KENALOG) 0.1 % paste Use as directed 1 application in the mouth or throat 2 (two) times daily.  warfarin (COUMADIN) 3 MG tablet TAKE ONE TABLET BY MOUTH EVERY DAY   Past Medical History:  Diagnosis Date  . Arthritis   . Cataract   . Chronic respiratory failure ([place])   . Endometriosis   . HTN (hypertension)   . Hx of echocardiogram    Echo (8/15):  Mild LVH, EF 65%, no RWMA, Ao sclerosis, no AS, mod MAC, mild LAE, normal RVSF, PASP 40 mmHg  . Hx pulmonary embolism    on chronic coumadin  . Obesities, morbid ([place])   . OSA (obstructive sleep apnea)   . Osteoporosis   . PAF (paroxysmal atrial fibrillation) ([place])  Event Monitor (8/15):  Frequent PACs, brief bursts of nonsustained ATach; no AFib  . Psoriasis   . Skin cancer   . Vertigo    Social History   Socioeconomic History  . Marital status: Widowed    Spouse name: Not on file  . Number of children: Not on file  . Years of education: Not on file  . Highest education level: Not on file  Occupational History  . Not on file  Social Needs  . Financial resource strain: Not on file  . Food insecurity:    Worry: Not on file    Inability: Not on file  . Transportation needs:    Medical: Not on file    Non-medical: Not on file  Tobacco Use  . Smoking status: Former Smoker    Packs/day: 1.00     Years: 2.00    Pack years: 2.00    Types: Cigarettes    Last attempt to quit: 04/03/1981    Years since quitting: 37.1  . Smokeless tobacco: Never Used  Substance and Sexual Activity  . Alcohol use: No  . Drug use: No  . Sexual activity: Not Currently  Lifestyle  . Physical activity:    Days per week: Not on file    Minutes per session: Not on file  . Stress: Not on file  Relationships  . Social connections:    Talks on phone: Not on file    Gets together: Not on file    Attends religious service: Not on file    Active member of club or organization: Not on file    Attends meetings of clubs or organizations: Not on file    Relationship status: Not on file  Other Topics Concern  . Not on file  Social History Narrative   She lives with her sister in [place] and requires assistance at home. Has an aid who visits and helps at home often. Son works as a Catering manager and also visits and helps when he can. Semi-independent in ADLs, requires walker around home, dependent in IADLs.   Family History  Problem Relation Age of Onset  . Heart attack Father   . Heart attack Brother   . Stroke Neg Hx    ASSESSMENT Lab Results  Component Value Date   INR 3.6 (A) 05/20/2018   INR 3.6 (A) 05/13/2018   INR 3.5 (A) 05/06/2018   Anticoagulation Dosing: 0.5 tablet (1.5 mg) 2 days per week, 1 tablet (3 mg) 5 days per week   INR today: Supratherapeutic  PLAN  Weekly dose was decreased to 0.5 tablets (1.5 mg) Tue/Thu/Sat, and 1 tablet (3 mg) M/W/F/Sa. Spoke to patient and son, [name], to review dosing instructions.  Patient advised to contact clinic or seek medical attention if signs/symptoms of bleeding or thromboembolism occur.  Patient/family verbalized understanding by repeating back information and was advised to contact me if further medication-related questions arise. Patient was also provided an information handout.  Follow-up 1 week  [name]

## 2018-05-22 ENCOUNTER — Encounter: Payer: Self-pay | Admitting: *Deleted

## 2018-05-22 LAB — PROTIME-INR

## 2018-05-22 NOTE — Progress Notes (Signed)
Reviewed Dr. Julianne Rice note.  Agree with plan.

## 2018-05-26 ENCOUNTER — Other Ambulatory Visit: Payer: Self-pay | Admitting: Internal Medicine

## 2018-05-26 DIAGNOSIS — Z86711 Personal history of pulmonary embolism: Secondary | ICD-10-CM

## 2018-05-26 DIAGNOSIS — Z7901 Long term (current) use of anticoagulants: Secondary | ICD-10-CM

## 2018-05-26 DIAGNOSIS — Z86718 Personal history of other venous thrombosis and embolism: Secondary | ICD-10-CM

## 2018-05-27 ENCOUNTER — Other Ambulatory Visit: Payer: Self-pay | Admitting: Internal Medicine

## 2018-05-27 LAB — POCT INR: INR: 3.2 — AB (ref 2.0–3.0)

## 2018-05-27 MED ORDER — HYDROCODONE-ACETAMINOPHEN 10-325 MG PO TABS: ORAL_TABLET | ORAL | 0 refills | Status: AC

## 2018-05-27 MED ORDER — HYDROCODONE-ACETAMINOPHEN 10-325 MG PO TABS: ORAL_TABLET | ORAL | 0 refills | Status: DC

## 2018-05-27 NOTE — Telephone Encounter (Signed)
HYDROcodone-acetaminophen [facility]) 10-325 MG tablet   Refill request @  [facility] - [address] - [place], [place] - [address] [phone] (Phone) [phone] (Fax)

## 2018-05-27 NOTE — Telephone Encounter (Signed)
Refills sent.  PDMP reviewed and appropriate for last year.

## 2018-05-27 NOTE — Telephone Encounter (Signed)
Last office visit: 02/07/18 in [facility]  Last UDS: 03/23/2016 Last Refill: 05/01/18  Next appt: 06/05/18 w/[facility]

## 2018-05-28 ENCOUNTER — Telehealth: Payer: Self-pay | Admitting: *Deleted

## 2018-05-28 ENCOUNTER — Telehealth: Payer: Self-pay

## 2018-05-28 NOTE — Telephone Encounter (Signed)
Received TC from patient, she states RX for Hydrocodone is not a pharmacy.  RX was sent via [place] yesterday and receipt confirmed by pharmacy.  This RN called pharmacy, pharmacist states he has RX and it is ready for pick up.  Pt called back and was notified.   SCha[place]in, RN,BSN

## 2018-05-28 NOTE — Telephone Encounter (Signed)
INR, 05/27/2018 at 1902  INR 3.2 Sending to dr Maudie Mercury and dr Daryll Drown

## 2018-05-29 ENCOUNTER — Telehealth (INDEPENDENT_AMBULATORY_CARE_PROVIDER_SITE_OTHER): Payer: Medicare Other | Admitting: Pharmacist

## 2018-05-29 DIAGNOSIS — I509 Heart failure, unspecified: Secondary | ICD-10-CM | POA: Diagnosis not present

## 2018-05-29 DIAGNOSIS — J441 Chronic obstructive pulmonary disease with (acute) exacerbation: Secondary | ICD-10-CM | POA: Diagnosis not present

## 2018-05-29 DIAGNOSIS — Z86718 Personal history of other venous thrombosis and embolism: Secondary | ICD-10-CM

## 2018-05-29 DIAGNOSIS — Z86711 Personal history of pulmonary embolism: Secondary | ICD-10-CM | POA: Diagnosis not present

## 2018-05-29 DIAGNOSIS — R5381 Other malaise: Secondary | ICD-10-CM | POA: Diagnosis not present

## 2018-05-29 DIAGNOSIS — Z7901 Long term (current) use of anticoagulants: Secondary | ICD-10-CM | POA: Diagnosis not present

## 2018-05-29 DIAGNOSIS — M6281 Muscle weakness (generalized): Secondary | ICD-10-CM | POA: Diagnosis not present

## 2018-05-29 NOTE — Progress Notes (Signed)
Anticoagulation Management Jacqueline Nguyen a 85 y.o.femalewhois being monitored for warfarin therapy using home INR meter  Indication: DVT and PEhistory, paroxysmal atrial fibrillation Duration: indefinite Supervising physician:Emily [facility]  Anticoagulation Clinic Visit History: Patient reports no signs/symptoms of concern.  Anticoagulation Episode Summary    Current INR goal:   2.0-3.0  TTR:   61.8 % (1.8 y)  Next INR check:   06/03/2018  INR from last check:   3.2! (05/27/2018)  Weekly max warfarin dose:     Target end date:     INR check location:     Preferred lab:     Send INR reminders to:      Indications   History of DVT (deep vein thrombosis) [Z86.718] Anticoagulated on Coumadin [Z79.01] History of pulmonary embolism [Z86.711]       Comments:         Anticoagulation Care Providers    Provider Role Specialty Phone number   [name], MD Responsible Internal Medicine [phone]     Allergies  Allergen Reactions  . Ciprofloxacin   . Bactrim Rash  . Codeine Nausea Only   Medication Sig  acetic acid-hydrocortisone (VOSOL-HC) OTIC solution Place 3 drops into the right ear 4 (four) times daily. For 7 days  atenolol (TENORMIN) 25 MG tablet TAKE ONE TABLET BY MOUTH FOUR TIMES DAILY  AZOPT 1 % ophthalmic suspension Place 1 drop into both eyes 2 (two) times daily.  COMBIGAN 0.2-0.5 % ophthalmic solution Place 1 drop into both eyes 2 (two) times daily.  diclofenac sodium (VOLTAREN) 1 % GEL Apply 2 g topically 4 (four) times daily as needed.  diltiazem (CARDIZEM CD) 120 MG 24 hr capsule TAKE ONE (1) CAPSULE BY MOUTH 2 TIMES DAILY  diltiazem (TIAZAC) 120 MG 24 hr capsule TAKE ONE Capsule BY MOUTH TWICE DAILY  FLOVENT HFA 44 MCG/ACT inhaler INHALE 1 PUFF TWICE A DAY DAILY  furosemide (LASIX) 20 MG tablet Take 2 tablets (40 mg total) by mouth daily.  HYDROcodone-acetaminophen (NORCO) 10-325 MG tablet TAKE ONE TABLET BY MOUTH EVERY 6 HOURS AS NEEDED FOR PAIN *to  last 30 days*  levalbuterol (XOPENEX) 0.63 MG/3ML nebulizer solution Take 3 mLs (0.63 mg total) by nebulization every 8 (eight) hours as needed for wheezing.  NYAMYC powder APPLY TOPICALLY FOUR TIMES DAILY  potassium chloride (K-DUR) 10 MEQ tablet TAKE ONE Tablet  BY MOUTH ONCE DAILY AS DIRECTED  PROAIR HFA 108 (90 Base) MCG/ACT inhaler INHALE 1-2 PUFFS INTO THE LUNGS DAILY AS NEEDED FOR SHORTNESS OF BREATH OR WHEEZING  Respiratory Therapy Supplies (FLUTTER) DEVI Blow through 4 times per set, three sets daily  SYMBICORT 160-4.5 MCG/ACT inhaler INHALE TWO PUFFS EVERY 12 HOURS  triamcinolone (KENALOG) 0.1 % paste Use as directed 1 application in the mouth or throat 2 (two) times daily.  warfarin (COUMADIN) 3 MG tablet TAKE ONE TABLET BY MOUTH EVERY DAY   Past Medical History:  Diagnosis Date  . Arthritis   . Cataract   . Chronic respiratory failure ([place])   . Endometriosis   . HTN (hypertension)   . Hx of echocardiogram    Echo (8/15):  Mild LVH, EF 65%, no RWMA, Ao sclerosis, no AS, mod MAC, mild LAE, normal RVSF, PASP 40 mmHg  . Hx pulmonary embolism    on chronic coumadin  . Obesities, morbid ([place])   . OSA (obstructive sleep apnea)   . Osteoporosis   . PAF (paroxysmal atrial fibrillation) ([place])    Event Monitor (8/15):  Frequent PACs, brief bursts  of nonsustained ATach; no AFib  . Psoriasis   . Skin cancer   . Vertigo    Social History   Socioeconomic History  . Marital status: Widowed    Spouse name: Not on file  . Number of children: Not on file  . Years of education: Not on file  . Highest education level: Not on file  Occupational History  . Not on file  Social Needs  . Financial resource strain: Not on file  . Food insecurity:    Worry: Not on file    Inability: Not on file  . Transportation needs:    Medical: Not on file    Non-medical: Not on file  Tobacco Use  . Smoking status: Former Smoker    Packs/day: 1.00    Years: 2.00    Pack years: 2.00    Types:  Cigarettes    Last attempt to quit: 04/03/1981    Years since quitting: 37.1  . Smokeless tobacco: Never Used  Substance and Sexual Activity  . Alcohol use: No  . Drug use: No  . Sexual activity: Not Currently  Lifestyle  . Physical activity:    Days per week: Not on file    Minutes per session: Not on file  . Stress: Not on file  Relationships  . Social connections:    Talks on phone: Not on file    Gets together: Not on file    Attends religious service: Not on file    Active member of club or organization: Not on file    Attends meetings of clubs or organizations: Not on file    Relationship status: Not on file  Other Topics Concern  . Not on file  Social History Narrative   She lives with her sister in [place] and requires assistance at home. Has an aid who visits and helps at home often. Son works as a Catering manager and also visits and helps when he can. Semi-independent in ADLs, requires walker around home, dependent in IADLs.   Family History  Problem Relation Age of Onset  . Heart attack Father   . Heart attack Brother   . Stroke Neg Hx    ASSESSMENT Lab Results  Component Value Date   INR 3.2 (A) 05/27/2018   INR 3.6 (A) 05/20/2018   INR 3.6 (A) 05/13/2018    INR today: Supratherapeutic  PLAN Weekly dose was decreased by 8%   Patient Instructions  Patient educated about medication as defined in this encounter and verbalized understanding by repeating back instructions provided.    Patient advised to contact clinic or seek medical attention if signs/symptoms of bleeding or thromboembolism occur.  Patient verbalized understanding by repeating back information and was advised to contact me if further medication-related questions arise. Patient was also provided an information handout.  Follow-up 1 week  [name]

## 2018-05-29 NOTE — Patient Instructions (Signed)
Patient educated about medication as defined in this encounter and verbalized understanding by repeating back instructions provided.   

## 2018-05-30 ENCOUNTER — Telehealth: Payer: Self-pay | Admitting: *Deleted

## 2018-05-30 DIAGNOSIS — G4733 Obstructive sleep apnea (adult) (pediatric): Secondary | ICD-10-CM | POA: Diagnosis not present

## 2018-05-30 NOTE — Telephone Encounter (Signed)
I will call the company tomorrow.  Sounds appropriate.  I will review documents when they come.  Thank you!

## 2018-05-30 NOTE — Telephone Encounter (Signed)
[facility] calls and states they have been providing pallative care to pt, the NP feels it is time for pt to move into hospice services, dr Daryll Drown would remain primary with hospice md taking on emergent issues. They will fax the standing orders for dr Daryll Drown to review. They will also supervise her medications more directly. [facility] Ph: [phone] Sending to dr's mullen and kim

## 2018-05-30 NOTE — Telephone Encounter (Signed)
See Dr Julianne Rice telephone encounter from yesterday.

## 2018-05-31 NOTE — Telephone Encounter (Signed)
Called and spoke with Baxter Flattery at [facility] who oversees care of Jacqueline Nguyen with Creta Levin, NP.    Jacqueline Nguyen is apparently having more DOE, SOB And difficulty recovering after activity. She has had a slow decline in her exercise tolerance given her co-morbidities.  I think transition to hospice for CHF, pulmonary HTN is appropriate.  Paperwork will be sent to my office.  I will continue to oversee her care, with acute daily care overseen by [facility].    Gilles Chiquito, MD

## 2018-06-01 ENCOUNTER — Other Ambulatory Visit (INDEPENDENT_AMBULATORY_CARE_PROVIDER_SITE_OTHER): Payer: Medicare Other | Admitting: Internal Medicine

## 2018-06-01 DIAGNOSIS — Z86711 Personal history of pulmonary embolism: Secondary | ICD-10-CM

## 2018-06-01 DIAGNOSIS — Z86718 Personal history of other venous thrombosis and embolism: Secondary | ICD-10-CM

## 2018-06-03 DIAGNOSIS — I480 Paroxysmal atrial fibrillation: Secondary | ICD-10-CM | POA: Diagnosis not present

## 2018-06-03 DIAGNOSIS — Z7901 Long term (current) use of anticoagulants: Secondary | ICD-10-CM | POA: Diagnosis not present

## 2018-06-05 ENCOUNTER — Encounter: Payer: Self-pay | Admitting: Internal Medicine

## 2018-06-05 ENCOUNTER — Other Ambulatory Visit: Payer: Self-pay

## 2018-06-05 ENCOUNTER — Ambulatory Visit (INDEPENDENT_AMBULATORY_CARE_PROVIDER_SITE_OTHER): Payer: Medicare Other | Admitting: Licensed Clinical Social Worker

## 2018-06-05 ENCOUNTER — Ambulatory Visit (INDEPENDENT_AMBULATORY_CARE_PROVIDER_SITE_OTHER): Payer: Medicare Other | Admitting: Internal Medicine

## 2018-06-05 VITALS — BP 114/43 | HR 62 | Temp 97.8°F | Wt 266.1 lb

## 2018-06-05 DIAGNOSIS — J9611 Chronic respiratory failure with hypoxia: Secondary | ICD-10-CM | POA: Diagnosis not present

## 2018-06-05 DIAGNOSIS — F321 Major depressive disorder, single episode, moderate: Secondary | ICD-10-CM

## 2018-06-05 DIAGNOSIS — J449 Chronic obstructive pulmonary disease, unspecified: Secondary | ICD-10-CM

## 2018-06-05 DIAGNOSIS — K5900 Constipation, unspecified: Secondary | ICD-10-CM

## 2018-06-05 DIAGNOSIS — I10 Essential (primary) hypertension: Secondary | ICD-10-CM

## 2018-06-05 DIAGNOSIS — J9610 Chronic respiratory failure, unspecified whether with hypoxia or hypercapnia: Secondary | ICD-10-CM

## 2018-06-05 DIAGNOSIS — Z7901 Long term (current) use of anticoagulants: Secondary | ICD-10-CM | POA: Diagnosis not present

## 2018-06-05 DIAGNOSIS — F4323 Adjustment disorder with mixed anxiety and depressed mood: Secondary | ICD-10-CM

## 2018-06-05 DIAGNOSIS — R5383 Other fatigue: Secondary | ICD-10-CM

## 2018-06-05 DIAGNOSIS — I480 Paroxysmal atrial fibrillation: Secondary | ICD-10-CM | POA: Diagnosis not present

## 2018-06-05 DIAGNOSIS — Z79899 Other long term (current) drug therapy: Secondary | ICD-10-CM

## 2018-06-05 DIAGNOSIS — H401132 Primary open-angle glaucoma, bilateral, moderate stage: Secondary | ICD-10-CM | POA: Diagnosis not present

## 2018-06-05 DIAGNOSIS — Z86718 Personal history of other venous thrombosis and embolism: Secondary | ICD-10-CM

## 2018-06-05 DIAGNOSIS — I82402 Acute embolism and thrombosis of unspecified deep veins of left lower extremity: Secondary | ICD-10-CM | POA: Insufficient documentation

## 2018-06-05 DIAGNOSIS — I2721 Secondary pulmonary arterial hypertension: Secondary | ICD-10-CM | POA: Diagnosis not present

## 2018-06-05 DIAGNOSIS — M1990 Unspecified osteoarthritis, unspecified site: Secondary | ICD-10-CM

## 2018-06-05 DIAGNOSIS — Z6841 Body Mass Index (BMI) 40.0 and over, adult: Secondary | ICD-10-CM

## 2018-06-05 DIAGNOSIS — Z86711 Personal history of pulmonary embolism: Secondary | ICD-10-CM

## 2018-06-05 DIAGNOSIS — Z9981 Dependence on supplemental oxygen: Secondary | ICD-10-CM

## 2018-06-05 MED ORDER — SERTRALINE HCL 25 MG PO TABS: 25.0000 mg | ORAL_TABLET | Freq: Every day | ORAL | 3 refills | Status: DC

## 2018-06-05 MED ORDER — POLYETHYLENE GLYCOL 3350 17 G PO PACK: 17.0000 g | PACK | Freq: Every day | ORAL | 0 refills | Status: AC

## 2018-06-05 NOTE — Assessment & Plan Note (Signed)
BP today is actually a little lower than usual for her.  She denies chest pain, dizziness, lightheadedness.  She notes that she is only taking diltiazem and occasional atenolol when she feels palpitations.    Plan Continue to monitor Wean diltiazem at next visit if symptomatic or lower BP noted.

## 2018-06-05 NOTE — Progress Notes (Signed)
Subjective:    Patient ID: Jacqueline Nguyen, female    DOB: Jun 16, 1931, 85 y.o.   MRN: [id number]  CC: Follow up for chronic issues including HTN  HPI  Jacqueline Nguyen is an 85 year old woman with PMH of pAF, OSA, pHTN, diastolic HF, chronic respiratory failure, chronic pain, deconditioning who presents for follow up.  I last saw her over the summer in July.    Since that time she saw Cardiology in September of 2019 and was noted to have gained weight.  Her lasix was increased and reminded of the sodicum restriction.  The goal was to lose 10 pounds on her home scale.    She saw Pulmonary in October of 2019 for follow up of lung nodules.  Noted to be at baseline.  OHS is a concern, she was wearing CPAP. Xray done at that time showed no acute disease.  Nodule was not commented on/seen.    She was seen in our clinic in November of 2019 for increased back pain.  She was advised to increase her lasix at that time (did not do from September) and use voltaren gel and tylenol.  She does have compression fractures and has never had a DEXA.  I do not think she can lie flat for the exam.    I received information from her home health agency in the last month requesting a transition to hospice care for her chronic respiratory failure, heart failure and deconditioning making it difficult for her to walk to and from the bathroom.  I agreed with their assessment, and will perform my own today.    Today Jacqueline Nguyen presents with her son [name].  She reports constipation and decreased appetite.  She is only eating soups and sometimes solid food.  She is taking a daily stool softener.  She notes having to strain and feeling weak.  She will sometimes wait to go to the bathroom b/c she prefers not to get our of her chair.  She is taking occasional miralax and fiber per her son.    Her main issue today was feeling of depression.  She notes lack of interest in things she used to enjoy, lack of interest in doing anything, feelings of  isolation, sleeping more.  PHQ-9 18.  We talked about transitions in life and she is willing to speak with our counselor, Dessie Coma today.    Review of Systems  Constitutional: Positive for activity change, appetite change and fatigue. Negative for chills, diaphoresis, fever and unexpected weight change.  Respiratory: Negative for cough and shortness of breath.   Cardiovascular: Positive for palpitations (occasional). Negative for chest pain and leg swelling.  Neurological: Negative for dizziness, weakness, light-headedness and numbness.  Psychiatric/Behavioral: Positive for decreased concentration, dysphoric mood and suicidal ideas (reports feeling like it would be better if she is not here.  No active plan for suicide). Negative for self-injury and sleep disturbance. The patient is not nervous/anxious.        Objective:   Physical Exam Vitals signs and nursing note reviewed.  Constitutional:      General: She is not in acute distress.    Appearance: She is obese. She is not toxic-appearing.  HENT:     Head: Normocephalic and atraumatic.  Cardiovascular:     Rate and Rhythm: Normal rate. Rhythm irregular.  Pulmonary:     Effort: Pulmonary effort is normal. No respiratory distress.     Breath sounds: No stridor. No wheezing.  Skin:  General: Skin is warm.     Comments: She has chronic skin changes to the face  Neurological:     General: No focal deficit present.     Mental Status: She is alert. Mental status is at baseline.  Psychiatric:        Attention and Perception: Attention normal.        Mood and Affect: Mood is depressed. Affect is tearful.        Speech: Speech normal.        Behavior: Behavior normal.        Thought Content: Thought content normal.        Judgment: Judgment normal.    CMET, CBC, TSH today       Assessment & Plan:  RTC in 2 months, sooner if needed

## 2018-06-05 NOTE — Assessment & Plan Note (Signed)
Following with Dr. Annamaria Boots in pulmonary.  She is at her baseline today.  She has oxygen at home.  She has no wheezing on exam.   Continue inhalers as needed as per Dr. Annamaria Boots Follow up with pulmonary.   Monitor for clinical changes.

## 2018-06-05 NOTE — Assessment & Plan Note (Deleted)
She has a history of DVT and PE for which she is on coumadin.  She checks her INR at home and then our pharmacist works with her to get a good INR for her.  She is doing well with the coumadin and has been on this for quite a while.  No acute issues.

## 2018-06-05 NOTE — Patient Instructions (Signed)
Jacqueline Nguyen - -  It was a pleasure to see you today.    For your mood, please start Sertraline 78m daily.  Best to take at night.  More information below.   Please come back to see me in 2 months, sooner if needed.   Thank you!  Sertraline tablets What is this medicine? SERTRALINE (SER tra leen) is used to treat depression. It may also be used to treat obsessive compulsive disorder, panic disorder, post-trauma stress, premenstrual dysphoric disorder (PMDD) or social anxiety. This medicine may be used for other purposes; ask your health care provider or pharmacist if you have questions. COMMON BRAND NAME(S): Zoloft What should I tell my health care provider before I take this medicine? They need to know if you have any of these conditions: -bleeding disorders -bipolar disorder or a family history of bipolar disorder -glaucoma -heart disease -high blood pressure -history of irregular heartbeat -history of low levels of calcium, magnesium, or potassium in the blood -if you often drink alcohol -liver disease -receiving electroconvulsive therapy -seizures -suicidal thoughts, plans, or attempt; a previous suicide attempt by you or a family member -take medicines that treat or prevent blood clots -thyroid disease -an unusual or allergic reaction to sertraline, other medicines, foods, dyes, or preservatives -pregnant or trying to get pregnant -breast-feeding How should I use this medicine? Take this medicine by mouth with a glass of water. Follow the directions on the prescription label. You can take it with or without food. Take your medicine at regular intervals. Do not take your medicine more often than directed. Do not stop taking this medicine suddenly except upon the advice of your doctor. Stopping this medicine too quickly may cause serious side effects or your condition may worsen. A special MedGuide will be given to you by the pharmacist with each prescription and refill. Be sure to  read this information carefully each time. Talk to your pediatrician regarding the use of this medicine in children. While this drug may be prescribed for children as young as 7 years for selected conditions, precautions do apply. Overdosage: If you think you have taken too much of this medicine contact a poison control center or emergency room at once. NOTE: This medicine is only for you. Do not share this medicine with others. What if I miss a dose? If you miss a dose, take it as soon as you can. If it is almost time for your next dose, take only that dose. Do not take double or extra doses. What may interact with this medicine? Do not take this medicine with any of the following medications: -cisapride -dofetilide -dronedarone -linezolid -MAOIs like Carbex, Eldepryl, Marplan, Nardil, and Parnate -methylene blue (injected into a vein) -pimozide -thioridazine This medicine may also interact with the following medications: -alcohol -amphetamines -aspirin and aspirin-like medicines -certain medicines for depression, anxiety, or psychotic disturbances -certain medicines for fungal infections like ketoconazole, fluconazole, posaconazole, and itraconazole -certain medicines for irregular heart beat like flecainide, quinidine, propafenone -certain medicines for migraine headaches like almotriptan, eletriptan, frovatriptan, naratriptan, rizatriptan, sumatriptan, zolmitriptan -certain medicines for sleep -certain medicines for seizures like carbamazepine, valproic acid, phenytoin -certain medicines that treat or prevent blood clots like warfarin, enoxaparin, dalteparin -cimetidine -digoxin -diuretics -fentanyl -isoniazid -lithium -NSAIDs, medicines for pain and inflammation, like ibuprofen or naproxen -other medicines that prolong the QT interval (cause an abnormal heart rhythm) -rasagiline -safinamide -supplements like St. John's wort, kava kava,  valerian -tolbutamide -tramadol -tryptophan This list may not describe all possible interactions.  Give your health care provider a list of all the medicines, herbs, non-prescription drugs, or dietary supplements you use. Also tell them if you smoke, drink alcohol, or use illegal drugs. Some items may interact with your medicine. What should I watch for while using this medicine? Tell your doctor if your symptoms do not get better or if they get worse. Visit your doctor or health care professional for regular checks on your progress. Because it may take several weeks to see the full effects of this medicine, it is important to continue your treatment as prescribed by your doctor. Patients and their families should watch out for new or worsening thoughts of suicide or depression. Also watch out for sudden changes in feelings such as feeling anxious, agitated, panicky, irritable, hostile, aggressive, impulsive, severely restless, overly excited and hyperactive, or not being able to sleep. If this happens, especially at the beginning of treatment or after a change in dose, call your health care professional. Dennis Bast may get drowsy or dizzy. Do not drive, use machinery, or do anything that needs mental alertness until you know how this medicine affects you. Do not stand or sit up quickly, especially if you are an older patient. This reduces the risk of dizzy or fainting spells. Alcohol may interfere with the effect of this medicine. Avoid alcoholic drinks. Your mouth may get dry. Chewing sugarless gum or sucking hard candy, and drinking plenty of water may help. Contact your doctor if the problem does not go away or is severe. What side effects may I notice from receiving this medicine? Side effects that you should report to your doctor or health care professional as soon as possible: -allergic reactions like skin rash, itching or hives, swelling of the face, lips, or tongue -anxious -black, tarry  stools -changes in vision -confusion -elevated mood, decreased need for sleep, racing thoughts, impulsive behavior -eye pain -fast, irregular heartbeat -feeling faint or lightheaded, falls -feeling agitated, angry, or irritable -hallucination, loss of contact with reality -loss of balance or coordination -loss of memory -painful or prolonged erections -restlessness, pacing, inability to keep still -seizures -stiff muscles -suicidal thoughts or other mood changes -trouble sleeping -unusual bleeding or bruising -unusually weak or tired -vomiting Side effects that usually do not require medical attention (report to your doctor or health care professional if they continue or are bothersome): -change in appetite or weight -change in sex drive or performance -diarrhea -increased sweating -indigestion, nausea -tremors This list may not describe all possible side effects. Call your doctor for medical advice about side effects. You may report side effects to FDA at 1-800-FDA-1088. Where should I keep my medicine? Keep out of the reach of children. Store at room temperature between 15 and 30 degrees C (59 and 86 degrees F). Throw away any unused medicine after the expiration date. NOTE: This sheet is a summary. It may not cover all possible information. If you have questions about this medicine, talk to your doctor, pharmacist, or health care provider.  2019 Elsevier/Gold Standard (2016-03-24 14:17:49)

## 2018-06-05 NOTE — Assessment & Plan Note (Signed)
HR is at goal today. She has a mild irregularity on my check.  Likely rate controlled afib. She is on coumadin.   Plan Continue diltiazem Continue coumadin and follow up in our coumadin clinic.

## 2018-06-05 NOTE — Assessment & Plan Note (Signed)
She has a history of DVT and PE for which she is on coumadin.  She checks her INR at home and then our pharmacist works with her to get a good INR for her.  She is doing well with the coumadin and has been on this for quite a while.  No acute issues.

## 2018-06-05 NOTE — Assessment & Plan Note (Signed)
She has chronic respiratory failure and is on oxygen.  She is having worsening symptoms and will transition to hospice services with her home health group to get more aide at home.  She has end stage respiratory failure.   Plan Continue oxygen and inhalers She is at baseline today, continue follow up with pulmonary

## 2018-06-05 NOTE — Assessment & Plan Note (Signed)
She has end stage respiratory failure, weight is unchanged.  She is unlikely to make significant changes here given age and comorbidities at this time.  She is basically sedentary due to arthritis and respiratory failure.    Plan Continue to monitor

## 2018-06-05 NOTE — BH Specialist Note (Signed)
Integrated Behavioral Health Initial Visit  MRN: [id number] Name: Jacqueline Nguyen  Number of [facility] Clinician visits:: 1/6 Session Start time: 10:10  Session End time: 11:10 Total time: 1 hour  Type of Service: [place] Off Completed. Yes, by Dr. Daryll Drown.      SUBJECTIVE: Jacqueline Nguyen is a 85 y.o. female accompanied by her son, Jacqueline Nguyen. Patient was referred by Dr. Daryll Drown for difficulty coping with loneliness, adjustment. Patient reports the following symptoms/concerns: loneliness, difficulty adjusting to becoming older, and limited mobility.  Duration of problem: increased over the past six months; Severity of problem: mild  OBJECTIVE: Mood: Depressed and Affect: Tearful Risk of harm to self or others: No plan to harm self or others  LIFE CONTEXT: Family and Social: Patient lives with her two adult sons. Patient reported that her sons live very busy lives, and that she is in the home by herself often. Patient shared with her son that she wants to go shopping and go to the grocery store more often. Patient is declining engaging in a community senior's group. Patient worries often about her two other sons that do not reside with her, and wishes she could spend more time with them. Patient was very social in her younger years, and reported that sitting in her chair for the majority of the day is increasing her sadness.  Self-Care: Patient has a decreased level of desire to engage in hobbies that she once found enjoyable. Patient shared with her son her desire to go shopping, and he agreed to take her out more often. Patient feels she is spending too much time in her chair with limited socialization with others. Patient told her son that she wants a variety of food served to her, and he agreed to try to work on improving her meal choices.  Life Changes: Adjusting to living with her sons.   GOALS ADDRESSED: Patient  will: 1. Reduce symptoms of: agitation, depression and difficulty with loneliness. 2. Increase knowledge and/or ability of: healthy habits and improve communicaiton to share what she needs with her natural supports.   3. Demonstrate ability to: Increase healthy adjustment to current life circumstances and Increase adequate support systems for patient/family  INTERVENTIONS: Interventions utilized: Motivational Interviewing, Behavioral Activation and Supportive Counseling. MI was used to identify healthy activities the patient would be willing to engage in when she is home by herself. Therapist encouraged the patient's son to share with his mother what joy she does bring to her life, and encourage her to find purpose again. Therapist encouraged the patient's son to assist his mother in engaging in some of the activities she identified as enjoyable. Therapist provided the patient with resources to locate a therapist in her area.  Standardized Assessments completed: assessed for SI, HI, and self-harm.  ASSESSMENT: Patient currently experiencing increased sadness/tearfulness over the past six months due to challenges with accepting her current phase of life.  Patient has limited mobility, and reported she spends more time than she would like by herself and in a chair in her living room. Patient is not engage in hobbies that she once found enjoyable, and feels she is sleeping her life away. When prompted by the therapist to identify her purpose, the patient reported she does not have one anymore. Patient is not having any thoughts of harming herself or others, but is hopeless. Patient identified activities that she wants to engage in, and her son is willing to assist her with  increasing her activity level. Patient worries frequently about her children, but she is not sharing her concerns with them.    Patient may benefit from bi-weekly outpatient therapy.  PLAN: 1. Follow up with behavioral health clinician  on : prn. 2. Referral(s): [employer] logistics/support/administrative officer (LME/Outside Clinic)   [place], [facility], LCAS

## 2018-06-06 ENCOUNTER — Encounter: Payer: Self-pay | Admitting: Licensed Clinical Social Worker

## 2018-06-06 LAB — CMP14 + ANION GAP
ALBUMIN: 3.9 g/dL (ref 3.6–4.6)
ALT: 7 IU/L (ref 0–32)
AST: 11 IU/L (ref 0–40)
Albumin/Globulin Ratio: 1.6 (ref 1.2–2.2)
Alkaline Phosphatase: 68 IU/L (ref 39–117)
Anion Gap: 16 mmol/L (ref 10.0–18.0)
BUN/Creatinine Ratio: 15 (ref 12–28)
BUN: 9 mg/dL (ref 8–27)
Bilirubin Total: 0.2 mg/dL (ref 0.0–1.2)
CHLORIDE: 99 mmol/L (ref 96–106)
CO2: 29 mmol/L (ref 20–29)
Calcium: 9.3 mg/dL (ref 8.7–10.3)
Creatinine, Ser: 0.6 mg/dL (ref 0.57–1.00)
GFR calc Af Amer: 97 mL/min/{1.73_m2} (ref >59)
GFR calc non Af Amer: 84 mL/min/{1.73_m2} (ref >59)
GLUCOSE: 90 mg/dL (ref 65–99)
Globulin, Total: 2.5 g/dL (ref 1.5–4.5)
Potassium: 4.3 mmol/L (ref 3.5–5.2)
Sodium: 144 mmol/L (ref 134–144)
Total Protein: 6.4 g/dL (ref 6.0–8.5)

## 2018-06-06 LAB — TSH: TSH: 1.26 u[IU]/mL (ref 0.450–4.500)

## 2018-06-06 LAB — CBC
HEMATOCRIT: 43.4 % (ref 34.0–46.6)
Hemoglobin: 14.1 g/dL (ref 11.1–15.9)
MCH: 30.9 pg (ref 26.6–33.0)
MCHC: 32.5 g/dL (ref 31.5–35.7)
MCV: 95 fL (ref 79–97)
Platelets: 349 10*3/uL (ref 150–450)
RBC: 4.57 x10E6/uL (ref 3.77–5.28)
RDW: 12.5 % (ref 11.7–15.4)
WBC: 8.7 10*3/uL (ref 3.4–10.8)

## 2018-06-10 ENCOUNTER — Encounter: Payer: Self-pay | Admitting: Pharmacist

## 2018-06-10 DIAGNOSIS — Z86711 Personal history of pulmonary embolism: Secondary | ICD-10-CM

## 2018-06-10 DIAGNOSIS — Z86718 Personal history of other venous thrombosis and embolism: Secondary | ICD-10-CM

## 2018-06-10 DIAGNOSIS — Z7901 Long term (current) use of anticoagulants: Secondary | ICD-10-CM

## 2018-06-10 LAB — POCT INR: INR: 2.8 (ref 2.0–3.0)

## 2018-06-10 NOTE — Progress Notes (Addendum)
Anticoagulation Management Indiana Gamero a 85 y.o.femalewhois being monitored for warfarin therapy using home INR meter  Indication: DVT and PEhistory, paroxysmal atrial fibrillation Duration: indefinite Supervising physician:Emily [facility]  Anticoagulation Clinic Visit History: Patient reports no signs/symptoms of concern. Spoke to patient's son, [name], who assists in her care.   Anticoagulation Episode Summary    Current INR goal:   2.0-3.0  TTR:   61.6 % (1.9 y)  Next INR check:   06/17/2018  INR from last check:   2.8 (06/10/2018)  Weekly max warfarin dose:     Target end date:     INR check location:     Preferred lab:     Send INR reminders to:      Indications   History of DVT (deep vein thrombosis) [Z86.718] Anticoagulated on Coumadin [Z79.01] History of pulmonary embolism [Z86.711]       Comments:         Anticoagulation Care Providers    Provider Role Specialty Phone number   [name], MD Responsible Internal Medicine [phone]     Allergies  Allergen Reactions  . Ciprofloxacin   . Bactrim Rash  . Codeine Nausea Only   Medication Sig  acetic acid-hydrocortisone (VOSOL-HC) OTIC solution Place 3 drops into the right ear 4 (four) times daily. For 7 days  atenolol (TENORMIN) 25 MG tablet TAKE ONE TABLET BY MOUTH FOUR TIMES DAILY  AZOPT 1 % ophthalmic suspension Place 1 drop into both eyes 2 (two) times daily.  COMBIGAN 0.2-0.5 % ophthalmic solution Place 1 drop into both eyes 2 (two) times daily.  diclofenac sodium (VOLTAREN) 1 % GEL Apply 2 g topically 4 (four) times daily as needed.  diltiazem (CARDIZEM CD) 120 MG 24 hr capsule TAKE ONE (1) CAPSULE BY MOUTH 2 TIMES DAILY  diltiazem (TIAZAC) 120 MG 24 hr capsule TAKE ONE Capsule BY MOUTH TWICE DAILY  FLOVENT HFA 44 MCG/ACT inhaler INHALE 1 PUFF TWICE A DAY DAILY  furosemide (LASIX) 20 MG tablet Take 2 tablets (40 mg total) by mouth daily.  HYDROcodone-acetaminophen (NORCO) 10-325 MG tablet TAKE  ONE TABLET BY MOUTH EVERY 6 HOURS AS NEEDED FOR PAIN *to last 30 days*  levalbuterol (XOPENEX) 0.63 MG/3ML nebulizer solution Take 3 mLs (0.63 mg total) by nebulization every 8 (eight) hours as needed for wheezing.  meclizine (ANTIVERT) 25 MG tablet TAKE ONE TABLET BY MOUTH 2-3 TIMES DAILY AS NEEDED FOR VERTIGO  NYAMYC powder APPLY TOPICALLY FOUR TIMES DAILY  polyethylene glycol (MIRALAX / GLYCOLAX) packet Take 17 g by mouth daily.  potassium chloride (K-DUR) 10 MEQ tablet TAKE ONE Tablet  BY MOUTH ONCE DAILY AS DIRECTED  PROAIR HFA 108 (90 Base) MCG/ACT inhaler INHALE 1-2 PUFFS INTO THE LUNGS DAILY AS NEEDED FOR SHORTNESS OF BREATH OR WHEEZING  Respiratory Therapy Supplies (FLUTTER) DEVI Blow through 4 times per set, three sets daily  sertraline (ZOLOFT) 25 MG tablet Take 1 tablet (25 mg total) by mouth at bedtime.  SYMBICORT 160-4.5 MCG/ACT inhaler INHALE TWO PUFFS EVERY 12 HOURS  triamcinolone (KENALOG) 0.1 % paste Use as directed 1 application in the mouth or throat 2 (two) times daily.  warfarin (COUMADIN) 3 MG tablet TAKE ONE TABLET BY MOUTH EVERY DAY   Past Medical History:  Diagnosis Date  . Arthritis   . Cataract   . Chronic respiratory failure ([place])   . Endometriosis   . HTN (hypertension)   . Hx of echocardiogram    Echo (8/15):  Mild LVH, EF 65%, no RWMA, Ao  sclerosis, no AS, mod MAC, mild LAE, normal RVSF, PASP 40 mmHg  . Hx pulmonary embolism    on chronic coumadin  . Obesities, morbid ([place])   . OSA (obstructive sleep apnea)   . Osteoporosis   . PAF (paroxysmal atrial fibrillation) ([place])    Event Monitor (8/15):  Frequent PACs, brief bursts of nonsustained ATach; no AFib  . Psoriasis   . Skin cancer   . Vertigo    Social History   Socioeconomic History  . Marital status: Widowed    Spouse name: Not on file  . Number of children: Not on file  . Years of education: Not on file  . Highest education level: Not on file  Occupational History  . Not on file  Social  Needs  . Financial resource strain: Not on file  . Food insecurity:    Worry: Not on file    Inability: Not on file  . Transportation needs:    Medical: Not on file    Non-medical: Not on file  Tobacco Use  . Smoking status: Former Smoker    Packs/day: 1.00    Years: 2.00    Pack years: 2.00    Types: Cigarettes    Last attempt to quit: 04/03/1981    Years since quitting: 37.2  . Smokeless tobacco: Never Used  Substance and Sexual Activity  . Alcohol use: No  . Drug use: No  . Sexual activity: Not Currently  Lifestyle  . Physical activity:    Days per week: Not on file    Minutes per session: Not on file  . Stress: Not on file  Relationships  . Social connections:    Talks on phone: Not on file    Gets together: Not on file    Attends religious service: Not on file    Active member of club or organization: Not on file    Attends meetings of clubs or organizations: Not on file    Relationship status: Not on file  Other Topics Concern  . Not on file  Social History Narrative   She lives with her sister in [place] and requires assistance at home. Has an aid who visits and helps at home often. Son works as a Catering manager and also visits and helps when he can. Semi-independent in ADLs, requires walker around home, dependent in IADLs.   Family History  Problem Relation Age of Onset  . Heart attack Father   . Heart attack Brother   . Stroke Neg Hx    ASSESSMENT Lab Results  Component Value Date   INR 2.8 06/10/2018   INR 3.2 (A) 05/27/2018   INR 3.6 (A) 05/20/2018    INR today: Therapeutic  PLAN Weekly dose was unchanged  There are no Patient Instructions on file for this visit. Patient advised to contact clinic or seek medical attention if signs/symptoms of bleeding or thromboembolism occur.  Patient verbalized understanding by repeating back information and was advised to contact me if further medication-related questions arise. Patient was also provided an  information handout.  Follow-up 1 week  [name]  15 minutes spent face-to-face with the patient during the encounter. 50% of time spent on education, including signs/sx bleeding and clotting, as well as food and drug interactions with warfarin. 50% of time was spent on fingerprick POC INR sample collection,processing, results determination, and documentation in http://[email or web address]

## 2018-06-10 NOTE — Addendum Note (Signed)
Addended by: Forde Dandy on: 06/10/2018 01:52 PM   Modules accepted: Orders

## 2018-06-11 DIAGNOSIS — G4733 Obstructive sleep apnea (adult) (pediatric): Secondary | ICD-10-CM | POA: Diagnosis not present

## 2018-06-11 NOTE — Assessment & Plan Note (Signed)
She is using her oxygen PRN.  She is doing functionally well in clinic from this standpoint.   Continue oxygen PRN Continue follow up with pulmonary.

## 2018-06-11 NOTE — Assessment & Plan Note (Signed)
This seems related to diet and sedentary lifestyle.  It is improved with miralax daily and increased fiber.  She does not have adequate daily fiber when her son is travelling IT consultant).  Will add miralax daily to med list so that her home health will give this to her when her son is not able.   Increase fiber Miralax daily

## 2018-06-11 NOTE — Assessment & Plan Note (Addendum)
I think this is a function of her depression and sedentary lifestyle.   Treat depression as noted below.   Check TSH

## 2018-06-11 NOTE — Progress Notes (Signed)
Reviewed Dr. Kim's note and agree with plan.  

## 2018-06-11 NOTE — Assessment & Plan Note (Signed)
She noted decreased energy, decreased interest in things she used to enjoy, increased sleep.  She would like to see a counselor and is interested in medication therapy.   Plan Refer to [facility] Sertraline 36m today Follow up in 2 months.

## 2018-06-17 ENCOUNTER — Telehealth (INDEPENDENT_AMBULATORY_CARE_PROVIDER_SITE_OTHER): Payer: Medicare Other | Admitting: Pharmacist

## 2018-06-17 DIAGNOSIS — I480 Paroxysmal atrial fibrillation: Secondary | ICD-10-CM | POA: Diagnosis not present

## 2018-06-17 DIAGNOSIS — Z5181 Encounter for therapeutic drug level monitoring: Secondary | ICD-10-CM | POA: Diagnosis not present

## 2018-06-17 DIAGNOSIS — Z7901 Long term (current) use of anticoagulants: Secondary | ICD-10-CM

## 2018-06-17 DIAGNOSIS — Z86711 Personal history of pulmonary embolism: Secondary | ICD-10-CM

## 2018-06-17 DIAGNOSIS — Z86718 Personal history of other venous thrombosis and embolism: Secondary | ICD-10-CM

## 2018-06-17 LAB — POCT INR: INR: 2.2 (ref 2.0–3.0)

## 2018-06-17 MED ORDER — WARFARIN SODIUM 3 MG PO TABS: 3.0000 mg | ORAL_TABLET | Freq: Every day | ORAL | 3 refills | Status: DC

## 2018-06-17 NOTE — Addendum Note (Signed)
Addended by: Forde Dandy on: 06/17/2018 03:08 PM   Modules accepted: Orders

## 2018-06-17 NOTE — Progress Notes (Signed)
Anticoagulation Management Gaye Scorza a 85 y.o.femalewhois being monitored for warfarin therapy using home INR meter  Indication: DVT and PEhistory, paroxysmal atrial fibrillation Duration: indefinite Supervising physician:Emily Mullen  Anticoagulation Episode Summary    Current INR goal:   2.0-3.0  TTR:   62.0 % (1.9 y)  Next INR check:   06/24/2018  INR from last check:   2.2 (06/17/2018)  Weekly max warfarin dose:     Target end date:     INR check location:     Preferred lab:     Send INR reminders to:      Indications   History of DVT (deep vein thrombosis) [Z86.718] Anticoagulated on Coumadin [Z79.01] History of pulmonary embolism [Z86.711]       Comments:         Anticoagulation Care Providers    Provider Role Specialty Phone number   [name], MD Responsible Internal Medicine [phone]     Allergies  Allergen Reactions  . Ciprofloxacin   . Bactrim Rash  . Codeine Nausea Only   Medication Sig  acetic acid-hydrocortisone (VOSOL-HC) OTIC solution Place 3 drops into the right ear 4 (four) times daily. For 7 days  atenolol (TENORMIN) 25 MG tablet TAKE ONE TABLET BY MOUTH FOUR TIMES DAILY  AZOPT 1 % ophthalmic suspension Place 1 drop into both eyes 2 (two) times daily.  COMBIGAN 0.2-0.5 % ophthalmic solution Place 1 drop into both eyes 2 (two) times daily.  diclofenac sodium (VOLTAREN) 1 % GEL Apply 2 g topically 4 (four) times daily as needed.  diltiazem (CARDIZEM CD) 120 MG 24 hr capsule TAKE ONE (1) CAPSULE BY MOUTH 2 TIMES DAILY  diltiazem (TIAZAC) 120 MG 24 hr capsule TAKE ONE Capsule BY MOUTH TWICE DAILY  FLOVENT HFA 44 MCG/ACT inhaler INHALE 1 PUFF TWICE A DAY DAILY  furosemide (LASIX) 20 MG tablet Take 2 tablets (40 mg total) by mouth daily.  HYDROcodone-acetaminophen (NORCO) 10-325 MG tablet TAKE ONE TABLET BY MOUTH EVERY 6 HOURS AS NEEDED FOR PAIN *to last 30 days*  levalbuterol (XOPENEX) 0.63 MG/3ML nebulizer solution Take 3 mLs (0.63 mg  total) by nebulization every 8 (eight) hours as needed for wheezing.  meclizine (ANTIVERT) 25 MG tablet TAKE ONE TABLET BY MOUTH 2-3 TIMES DAILY AS NEEDED FOR VERTIGO  NYAMYC powder APPLY TOPICALLY FOUR TIMES DAILY  polyethylene glycol (MIRALAX / GLYCOLAX) packet Take 17 g by mouth daily.  potassium chloride (K-DUR) 10 MEQ tablet TAKE ONE Tablet  BY MOUTH ONCE DAILY AS DIRECTED  PROAIR HFA 108 (90 Base) MCG/ACT inhaler INHALE 1-2 PUFFS INTO THE LUNGS DAILY AS NEEDED FOR SHORTNESS OF BREATH OR WHEEZING  Respiratory Therapy Supplies (FLUTTER) DEVI Blow through 4 times per set, three sets daily  sertraline (ZOLOFT) 25 MG tablet Take 1 tablet (25 mg total) by mouth at bedtime.  SYMBICORT 160-4.5 MCG/ACT inhaler INHALE TWO PUFFS EVERY 12 HOURS  triamcinolone (KENALOG) 0.1 % paste Use as directed 1 application in the mouth or throat 2 (two) times daily.  warfarin (COUMADIN) 3 MG tablet TAKE ONE TABLET BY MOUTH EVERY DAY   Past Medical History:  Diagnosis Date  . Arthritis   . Cataract   . Chronic respiratory failure ([place])   . Endometriosis   . HTN (hypertension)   . Hx of echocardiogram    Echo (8/15):  Mild LVH, EF 65%, no RWMA, Ao sclerosis, no AS, mod MAC, mild LAE, normal RVSF, PASP 40 mmHg  . Hx pulmonary embolism    on chronic  coumadin  . Obesities, morbid ([place])   . OSA (obstructive sleep apnea)   . Osteoporosis   . PAF (paroxysmal atrial fibrillation) ([place])    Event Monitor (8/15):  Frequent PACs, brief bursts of nonsustained ATach; no AFib  . Psoriasis   . Skin cancer   . Vertigo    Social History   Socioeconomic History  . Marital status: Widowed    Spouse name: Not on file  . Number of children: Not on file  . Years of education: Not on file  . Highest education level: Not on file  Occupational History  . Not on file  Social Needs  . Financial resource strain: Not on file  . Food insecurity:    Worry: Not on file    Inability: Not on file  . Transportation needs:     Medical: Not on file    Non-medical: Not on file  Tobacco Use  . Smoking status: Former Smoker    Packs/day: 1.00    Years: 2.00    Pack years: 2.00    Types: Cigarettes    Last attempt to quit: 04/03/1981    Years since quitting: 37.2  . Smokeless tobacco: Never Used  Substance and Sexual Activity  . Alcohol use: No  . Drug use: No  . Sexual activity: Not Currently  Lifestyle  . Physical activity:    Days per week: Not on file    Minutes per session: Not on file  . Stress: Not on file  Relationships  . Social connections:    Talks on phone: Not on file    Gets together: Not on file    Attends religious service: Not on file    Active member of club or organization: Not on file    Attends meetings of clubs or organizations: Not on file    Relationship status: Not on file  Other Topics Concern  . Not on file  Social History Narrative   She lives with her sister in [place] and requires assistance at home. Has an aid who visits and helps at home often. Son works as a Catering manager and also visits and helps when he can. Semi-independent in ADLs, requires walker around home, dependent in IADLs.   Family History  Problem Relation Age of Onset  . Heart attack Father   . Heart attack Brother   . Stroke Neg Hx    ASSESSMENT Lab Results  Component Value Date   INR 2.2 06/17/2018   INR 2.8 06/10/2018   INR 3.2 (A) 05/27/2018   Anticoagulation Dosing: 1 tablet daily except 1/2 tablet Tue/Thu/Sat   INR today: Therapeutic  PLAN Weekly dose was unchanged   There are no Patient Instructions on file for this visit. Patient advised to contact clinic or seek medical attention if signs/symptoms of bleeding or thromboembolism occur.  Patient verbalized understanding by repeating back information and was advised to contact me if further medication-related questions arise. Patient was also provided an information handout.  Follow-up 1 week  [name]

## 2018-06-18 LAB — PROTIME-INR

## 2018-06-18 NOTE — Telephone Encounter (Signed)
Reviewed note by Dr. Kim and agree with plan.  

## 2018-06-19 ENCOUNTER — Encounter: Payer: Medicare Other | Admitting: Internal Medicine

## 2018-06-19 ENCOUNTER — Encounter: Payer: Self-pay | Admitting: Pharmacist

## 2018-06-19 ENCOUNTER — Telehealth: Payer: Self-pay | Admitting: Internal Medicine

## 2018-06-19 ENCOUNTER — Other Ambulatory Visit: Payer: Self-pay | Admitting: Pharmacist

## 2018-06-19 DIAGNOSIS — I480 Paroxysmal atrial fibrillation: Secondary | ICD-10-CM

## 2018-06-19 DIAGNOSIS — Z86718 Personal history of other venous thrombosis and embolism: Secondary | ICD-10-CM

## 2018-06-19 DIAGNOSIS — Z7901 Long term (current) use of anticoagulants: Secondary | ICD-10-CM

## 2018-06-19 DIAGNOSIS — Z86711 Personal history of pulmonary embolism: Secondary | ICD-10-CM

## 2018-06-19 NOTE — Telephone Encounter (Signed)
Called Jacqueline Nguyen to check on her with starting sertraline.   She reports no side effects from the medication.  She does feel that it is helping her sleep some.  She is being careful and limiting contact during the coronavirus issues going on.  She is high risk for severe complications if she were to contract the virus.    She expressed understanding of these issues.   Gilles Chiquito, MD

## 2018-06-24 ENCOUNTER — Telehealth: Payer: Self-pay | Admitting: *Deleted

## 2018-06-24 LAB — POCT INR: INR: 2.2 (ref 2.0–3.0)

## 2018-06-24 MED ORDER — SERTRALINE HCL 50 MG PO TABS: 50.0000 mg | ORAL_TABLET | Freq: Every day | ORAL | 3 refills | Status: AC

## 2018-06-24 NOTE — Telephone Encounter (Signed)
Jacqueline Nguyen, pt's son has passed away, she found him this am in the floor. 1 son and a friend is with her now but she would like dr Daryll Drown to call her asap

## 2018-06-24 NOTE — Telephone Encounter (Signed)
Called Ms Neidert back.   Apparently, Jacqueline Nguyen was still breathing when he was found down.  Terry's twin called EMS and he was taken to the hospital.  She is very stressed, as she has not heard from her son's about Terry's condition.  She is worried he has passed away.  She has had friends with her and calling her, but she is currently alone.  Her oldest son is on his way from [place], [place].  Another family member will be over around 5pm.  She was wondering if she can get anything for nerves.  She has severe underlying respiratory issues, so I advised her that BZDs would not be appropriate.  We will try increasing her sertraline to 31m today, which she expressed understanding of.  I think she will feel better once she gets news about her sons condition.  I advised her to call back tomorrow with an update.    EGilles Chiquito MD

## 2018-06-25 ENCOUNTER — Telehealth (INDEPENDENT_AMBULATORY_CARE_PROVIDER_SITE_OTHER): Payer: Medicare Other | Admitting: Pharmacist

## 2018-06-25 DIAGNOSIS — Z5181 Encounter for therapeutic drug level monitoring: Secondary | ICD-10-CM

## 2018-06-25 DIAGNOSIS — Z86718 Personal history of other venous thrombosis and embolism: Secondary | ICD-10-CM

## 2018-06-25 DIAGNOSIS — I480 Paroxysmal atrial fibrillation: Secondary | ICD-10-CM | POA: Diagnosis not present

## 2018-06-25 DIAGNOSIS — Z7901 Long term (current) use of anticoagulants: Secondary | ICD-10-CM | POA: Diagnosis not present

## 2018-06-25 DIAGNOSIS — Z86711 Personal history of pulmonary embolism: Secondary | ICD-10-CM | POA: Diagnosis not present

## 2018-06-25 NOTE — Progress Notes (Signed)
Anticoagulation Management Jacqueline Nguyen a 85 y.o.femalewhois being monitored for warfarin therapy using home INR meter  Indication: DVT and PEhistory, paroxysmal atrial fibrillation Duration: indefinite Supervising physician:Jacqueline Nguyen  Unable to reach patient, but no dose changes are needed today.  Anticoagulation Episode Summary    Current INR goal:   2.0-3.0  TTR:   62.4 % (1.9 y)  Next INR check:   07/01/2018  INR from last check:   2.2 (06/24/2018)  Weekly max warfarin dose:     Target end date:     INR check location:     Preferred lab:     Send INR reminders to:      Indications   History of DVT (deep vein thrombosis) [Z86.718] Anticoagulated on Coumadin [Z79.01] History of pulmonary embolism [Z86.711]       Comments:         Anticoagulation Care Providers    Provider Role Specialty Phone number   Jacqueline Falcon, MD Responsible Internal Medicine [phone]     Allergies  Allergen Reactions  . Ciprofloxacin   . Bactrim Rash  . Codeine Nausea Only   Medication Sig  acetic acid-hydrocortisone (VOSOL-HC) OTIC solution Place 3 drops into the right ear 4 (four) times daily. For 7 days  atenolol (TENORMIN) 25 MG tablet TAKE ONE TABLET BY MOUTH FOUR TIMES DAILY  AZOPT 1 % ophthalmic suspension Place 1 drop into both eyes 2 (two) times daily.  COMBIGAN 0.2-0.5 % ophthalmic solution Place 1 drop into both eyes 2 (two) times daily.  diclofenac sodium (VOLTAREN) 1 % GEL Apply 2 g topically 4 (four) times daily as needed.  diltiazem (CARDIZEM CD) 120 MG 24 hr capsule TAKE ONE (1) CAPSULE BY MOUTH 2 TIMES DAILY  diltiazem (TIAZAC) 120 MG 24 hr capsule TAKE ONE Capsule BY MOUTH TWICE DAILY  FLOVENT HFA 44 MCG/ACT inhaler INHALE 1 PUFF TWICE A DAY DAILY  furosemide (LASIX) 20 MG tablet Take 2 tablets (40 mg total) by mouth daily.  HYDROcodone-acetaminophen (NORCO) 10-325 MG tablet TAKE ONE TABLET BY MOUTH EVERY 6 HOURS AS NEEDED FOR PAIN *to last 30 days*   levalbuterol (XOPENEX) 0.63 MG/3ML nebulizer solution Take 3 mLs (0.63 mg total) by nebulization every 8 (eight) hours as needed for wheezing.  meclizine (ANTIVERT) 25 MG tablet TAKE ONE TABLET BY MOUTH 2-3 TIMES DAILY AS NEEDED FOR VERTIGO  NYAMYC powder APPLY TOPICALLY FOUR TIMES DAILY  polyethylene glycol (MIRALAX / GLYCOLAX) packet Take 17 g by mouth daily.  potassium chloride (K-DUR) 10 MEQ tablet TAKE ONE Tablet  BY MOUTH ONCE DAILY AS DIRECTED  PROAIR HFA 108 (90 Base) MCG/ACT inhaler INHALE 1-2 PUFFS INTO THE LUNGS DAILY AS NEEDED FOR SHORTNESS OF BREATH OR WHEEZING  Respiratory Therapy Supplies (FLUTTER) DEVI Blow through 4 times per set, three sets daily  sertraline (ZOLOFT) 50 MG tablet Take 1 tablet (50 mg total) by mouth at bedtime.  SYMBICORT 160-4.5 MCG/ACT inhaler INHALE TWO PUFFS EVERY 12 HOURS  triamcinolone (KENALOG) 0.1 % paste Use as directed 1 application in the mouth or throat 2 (two) times daily.  warfarin (COUMADIN) 3 MG tablet Take 1 tablet (3 mg total) by mouth daily. Except take 1/2 tablet Tuesdays, Thursdays, and Saturdays.   Past Medical History:  Diagnosis Date  . Arthritis   . Cataract   . Chronic respiratory failure ([place])   . Endometriosis   . HTN (hypertension)   . Hx of echocardiogram    Echo (8/15):  Mild LVH, EF 65%, no RWMA, Ao  sclerosis, no AS, mod MAC, mild LAE, normal RVSF, PASP 40 mmHg  . Hx pulmonary embolism    on chronic coumadin  . Obesities, morbid ([place])   . OSA (obstructive sleep apnea)   . Osteoporosis   . PAF (paroxysmal atrial fibrillation) ([place])    Event Monitor (8/15):  Frequent PACs, brief bursts of nonsustained ATach; no AFib  . Psoriasis   . Skin cancer   . Vertigo    Social History   Socioeconomic History  . Marital status: Widowed    Spouse name: Not on file  . Number of children: Not on file  . Years of education: Not on file  . Highest education level: Not on file  Occupational History  . Not on file  Social Needs   . Financial resource strain: Not on file  . Food insecurity:    Worry: Not on file    Inability: Not on file  . Transportation needs:    Medical: Not on file    Non-medical: Not on file  Tobacco Use  . Smoking status: Former Smoker    Packs/day: 1.00    Years: 2.00    Pack years: 2.00    Types: Cigarettes    Last attempt to quit: 04/03/1981    Years since quitting: 37.2  . Smokeless tobacco: Never Used  Substance and Sexual Activity  . Alcohol use: No  . Drug use: No  . Sexual activity: Not Currently  Lifestyle  . Physical activity:    Days per week: Not on file    Minutes per session: Not on file  . Stress: Not on file  Relationships  . Social connections:    Talks on phone: Not on file    Gets together: Not on file    Attends religious service: Not on file    Active member of club or organization: Not on file    Attends meetings of clubs or organizations: Not on file    Relationship status: Not on file  Other Topics Concern  . Not on file  Social History Narrative   She lives with her sister in [place] and requires assistance at home. Has an aid who visits and helps at home often. Son works as a Catering manager and also visits and helps when he can. Semi-independent in ADLs, requires walker around home, dependent in IADLs.   Family History  Problem Relation Age of Onset  . Heart attack Father   . Heart attack Brother   . Stroke Neg Hx    ASSESSMENT Lab Results  Component Value Date   INR 2.2 06/24/2018   INR 2.2 06/17/2018   INR 2.8 06/10/2018   Anticoagulation Dosing: 1 tablet daily x 4 days per week, 1/2 tablet daily x 3 days per week (16.5 mg total weekly)   INR today: Therapeutic  PLAN Weekly dose was unchanged   There are no Patient Instructions on file for this visit.  Follow-up 1 week  Jacqueline Nguyen

## 2018-06-26 LAB — PROTIME-INR

## 2018-06-28 DIAGNOSIS — G4733 Obstructive sleep apnea (adult) (pediatric): Secondary | ICD-10-CM | POA: Diagnosis not present

## 2018-06-30 DIAGNOSIS — R300 Dysuria: Secondary | ICD-10-CM | POA: Diagnosis not present

## 2018-06-30 DIAGNOSIS — R8281 Pyuria: Secondary | ICD-10-CM | POA: Diagnosis not present

## 2018-07-02 ENCOUNTER — Telehealth: Payer: Self-pay | Admitting: *Deleted

## 2018-07-02 NOTE — Telephone Encounter (Signed)
Dr Daryll Drown stated she will talk to pt's son, KT, tomorrow to address his questions and concerns.  I called KT - stated he will be available tomorrow @ 1000 Am to talk to Dr Daryll Drown.  I also called Mrs Obando for permission who stated "It is definitely ok with me" for Dr Daryll Drown to talk to her son, KT. And she wants to be informed of what was discussed.

## 2018-07-02 NOTE — Telephone Encounter (Signed)
Call from pt's son KT Jerelene Redden -  He informed me of his brother's death 1 week ago and he's trying to learn how to take of his mother; very anxious. Stated his mother "does not know what's going on". Stated she's taking too much Hydrocodone. He stated [facility] pt  And [facility] is helping with placement. She's receiving 24 hr care from [facility] which is expensive and can only afford 2 more months of care. KT would like Dr Daryll Drown to call him [phone]) instead of his mother. And stated he does not know how to do the home INR's but [facility]osp[facility] stated they would help but still has questions. I will send this message to Dr Georges Mouse also.

## 2018-07-03 ENCOUNTER — Telehealth: Payer: Self-pay | Admitting: Pharmacist

## 2018-07-03 ENCOUNTER — Other Ambulatory Visit: Payer: Self-pay | Admitting: Pharmacist

## 2018-07-03 DIAGNOSIS — Z86718 Personal history of other venous thrombosis and embolism: Secondary | ICD-10-CM

## 2018-07-03 DIAGNOSIS — I480 Paroxysmal atrial fibrillation: Secondary | ICD-10-CM

## 2018-07-03 MED ORDER — APIXABAN 5 MG PO TABS: 5.0000 mg | ORAL_TABLET | Freq: Two times a day (BID) | ORAL | 11 refills | Status: AC

## 2018-07-03 NOTE — Telephone Encounter (Signed)
Called patients son KT at number provided, introduced myself.  See previous documentation, patient consented for me to talk to her son based on conversation with Holley Raring, Therapist, sports.   KT tells me that his twin brother [name] died last week of a massive heart attack.  [name] did most of the caring for his mother and he lived with KT and his mother.  His mother has taken this hard, is more confused and having a hard time at home.  KT reports that there is no one who can take care of her 24/7 like [name] did in the family and he is interested in placement, likely a SNF with hospice in place.  She will need an FL2.  She would like to go back to [place] for [place].  Currently she is getting 24 hour care through [facility], but this is $25/hour for day, $35/hour night and they cannot afford that for more than about 1-2 months at most.    He is in discussion with his lawyer about POA and DNR.  I advised him that she would be better served with a DNR order.  I will ask her hospice providers to discuss with her.    He notes that she has stopped taking her fluid pill as she does not want to go to the bathroom.  He also thinks she is taking too much hydrocodone.    We also discussed her coumadin.  KT is very concerned about checking her INR and does not think he can do that regularly.  Dr. Maudie Mercury in Pharmacy and I had already discussed a change to Eliquis.  I will attach Dr. Maudie Mercury to this.  During her next call with Ms. Storck, we need to discuss changing to a DOAC which does not require monitoring going forward.     Plan - -  We will contact her hospice group and try to coordinate FL2 for placement, DNR order and further care.   KT is working on becoming her POA, and presumably her HCPOA.   It is likely appropriate to continue hydrocodone at this time for breathlessness as she is on hospice.  This may change if she goes to inpatient hospice.   I will continue to discuss with KT and family to help coordinate  appropriate care for [name].  Dr. Maudie Mercury to address [place] at next call with pharmacy.    Gilles Chiquito, MD

## 2018-07-03 NOTE — Telephone Encounter (Signed)
Spoke with Baxter Flattery at [facility]. She states that their Market researcher, Dr. Leamon Arnt Reita May is listed as patient's PCP. States they are in process of getting DNR order signed and will assist patient with placement. Their SW is also involved. Nothing needed from Dr. Daryll Drown at this time. Hubbard Hartshorn, RN, BSN

## 2018-07-03 NOTE — Telephone Encounter (Signed)
Spoke to patient's son, KT, to provide education regarding switch from warfarin to Eliquis, including dosing, no longer needing INR monitoring, and continuing to monitor for potential side effects. Advised KT to contact clinic if any questions. KT verbalized understanding by repeat back. Eliquis prescription generated with instructions for pharmacy to discontinue warfarin.

## 2018-07-03 NOTE — Progress Notes (Signed)
Done!  Thank you

## 2018-07-04 NOTE — Telephone Encounter (Signed)
Excellent.  Thank you for following up.  I think that will give KT peace of mind.

## 2018-07-08 ENCOUNTER — Telehealth: Payer: Self-pay | Admitting: *Deleted

## 2018-07-08 NOTE — Telephone Encounter (Signed)
I am not sure except to call EMS again to check on her.    I think she needs to be placed in an assisted living facility.  I spoke with KT last week and apparently her home health care service is listed as her PCP now and they are working on that.   I would recommend calling her home health group and asking them to go see her today.

## 2018-07-08 NOTE — Telephone Encounter (Signed)
Received call from pt's caregiver, Baker Janus from [facility] - stated pt had c/o chest pain and difficulty breathing so she called Hospice and her supervisor who told her to call 911 which she did. EMS did arrive; checked pt and her oxygen; stated pt has mucous. Caregiver stated pt needs additional help. She called pt's son, KT - but stated "he's never here" and "will not answer his phone".  Asked about Hospice , stated when she called them,  they told her call 911 and will not come to see pt. Caregiver sounded stressed out about what to do. She talked pt's son, [name] who recently died and how he took care of his mother and there's no one else ( 1 son does not call; and another one cannot travel d/t covid 19 restrictions). So she called pt's PCP to see if anything soul be done.

## 2018-07-09 NOTE — Telephone Encounter (Signed)
Attempted to call patient at number provided.  No answer.  Voicemail full.

## 2018-07-09 NOTE — Telephone Encounter (Signed)
Pt would like someone to call her, she said that she loss her son and would like something for anxiety; pls contact [phone]

## 2018-07-12 DIAGNOSIS — G4733 Obstructive sleep apnea (adult) (pediatric): Secondary | ICD-10-CM | POA: Diagnosis not present

## 2018-07-17 ENCOUNTER — Telehealth: Payer: Self-pay | Admitting: *Deleted

## 2018-07-17 NOTE — Telephone Encounter (Signed)
Pt now has round the clock care, lynn calls and states pt is very upset and depressed. I spoke w/ pt she states she does not have anyone to bring her to office now. Jeani Hawking states if you call pt mon- thurs from 1230 to 530 she will answer at  [phone]

## 2018-07-18 NOTE — Telephone Encounter (Signed)
I have tried also, will call sons if she does not call back

## 2018-07-18 NOTE — Telephone Encounter (Signed)
Called and number states disconnected.  Please advise.  Is there another number?

## 2018-07-26 DIAGNOSIS — Z79899 Other long term (current) drug therapy: Secondary | ICD-10-CM | POA: Diagnosis not present

## 2018-07-26 NOTE — Telephone Encounter (Signed)
Still cannot reach pt by phone

## 2018-07-29 DIAGNOSIS — G4733 Obstructive sleep apnea (adult) (pediatric): Secondary | ICD-10-CM | POA: Diagnosis not present

## 2018-08-01 ENCOUNTER — Telehealth: Payer: Self-pay | Admitting: *Deleted

## 2018-08-01 NOTE — Telephone Encounter (Signed)
Excellent.  Thank you!

## 2018-08-01 NOTE — Telephone Encounter (Signed)
Received FL2 completed by PCP. No recent PPD and patient has refused pneumococcal and TDapP in past. Spoke with Kandice Moos at [facility]. States it is fine to fax FL2 to her without these components. Patient will be quarantined for 14 days upon arrival to facility 2/2 Covid restrictions. They will most likely be able to do the PPD testing at their facility during this time and will speak to patient and son regarding vaccinations. FL2 faxed to [place] at [phone]. Hubbard Hartshorn, RN, BSN

## 2018-08-05 NOTE — Telephone Encounter (Signed)
[place] office received call from [facility] that they do not add salt to their food, therefore, diet order should be Regular Diet. Form with requested change placed in PCP's box for signature. Hubbard Hartshorn, RN, BSN

## 2018-08-07 ENCOUNTER — Telehealth: Payer: Self-pay | Admitting: *Deleted

## 2018-08-07 NOTE — Telephone Encounter (Signed)
Signed form.  Thank you!

## 2018-08-07 NOTE — Telephone Encounter (Signed)
Call from [place] at [facility] in [place]- requesting pt's last weight b/c they are unable to obtain.  She stated pt arrived yesterday to their facility.  She weighted 266 lbs on 06/05/18.

## 2018-08-08 DIAGNOSIS — M549 Dorsalgia, unspecified: Secondary | ICD-10-CM | POA: Diagnosis not present

## 2018-08-08 DIAGNOSIS — Z7951 Long term (current) use of inhaled steroids: Secondary | ICD-10-CM | POA: Diagnosis not present

## 2018-08-08 DIAGNOSIS — K5900 Constipation, unspecified: Secondary | ICD-10-CM | POA: Diagnosis not present

## 2018-08-08 DIAGNOSIS — J9610 Chronic respiratory failure, unspecified whether with hypoxia or hypercapnia: Secondary | ICD-10-CM | POA: Diagnosis not present

## 2018-08-08 DIAGNOSIS — G8929 Other chronic pain: Secondary | ICD-10-CM | POA: Diagnosis not present

## 2018-08-08 DIAGNOSIS — I480 Paroxysmal atrial fibrillation: Secondary | ICD-10-CM | POA: Diagnosis not present

## 2018-08-08 DIAGNOSIS — I110 Hypertensive heart disease with heart failure: Secondary | ICD-10-CM | POA: Diagnosis not present

## 2018-08-08 DIAGNOSIS — Z7901 Long term (current) use of anticoagulants: Secondary | ICD-10-CM | POA: Diagnosis not present

## 2018-08-08 DIAGNOSIS — J449 Chronic obstructive pulmonary disease, unspecified: Secondary | ICD-10-CM | POA: Diagnosis not present

## 2018-08-08 DIAGNOSIS — I5032 Chronic diastolic (congestive) heart failure: Secondary | ICD-10-CM | POA: Diagnosis not present

## 2018-08-08 DIAGNOSIS — Z9981 Dependence on supplemental oxygen: Secondary | ICD-10-CM | POA: Diagnosis not present

## 2018-08-11 DIAGNOSIS — G4733 Obstructive sleep apnea (adult) (pediatric): Secondary | ICD-10-CM | POA: Diagnosis not present

## 2018-08-12 ENCOUNTER — Telehealth: Payer: Self-pay | Admitting: Internal Medicine

## 2018-08-12 DIAGNOSIS — I5032 Chronic diastolic (congestive) heart failure: Secondary | ICD-10-CM | POA: Diagnosis not present

## 2018-08-12 DIAGNOSIS — Z7951 Long term (current) use of inhaled steroids: Secondary | ICD-10-CM | POA: Diagnosis not present

## 2018-08-12 DIAGNOSIS — J449 Chronic obstructive pulmonary disease, unspecified: Secondary | ICD-10-CM | POA: Diagnosis not present

## 2018-08-12 DIAGNOSIS — K5900 Constipation, unspecified: Secondary | ICD-10-CM | POA: Diagnosis not present

## 2018-08-12 DIAGNOSIS — G8929 Other chronic pain: Secondary | ICD-10-CM | POA: Diagnosis not present

## 2018-08-12 DIAGNOSIS — Z7901 Long term (current) use of anticoagulants: Secondary | ICD-10-CM | POA: Diagnosis not present

## 2018-08-12 DIAGNOSIS — I110 Hypertensive heart disease with heart failure: Secondary | ICD-10-CM | POA: Diagnosis not present

## 2018-08-12 DIAGNOSIS — Z9981 Dependence on supplemental oxygen: Secondary | ICD-10-CM | POA: Diagnosis not present

## 2018-08-12 DIAGNOSIS — J9610 Chronic respiratory failure, unspecified whether with hypoxia or hypercapnia: Secondary | ICD-10-CM | POA: Diagnosis not present

## 2018-08-12 DIAGNOSIS — I480 Paroxysmal atrial fibrillation: Secondary | ICD-10-CM | POA: Diagnosis not present

## 2018-08-12 DIAGNOSIS — M549 Dorsalgia, unspecified: Secondary | ICD-10-CM | POA: Diagnosis not present

## 2018-08-12 NOTE — Telephone Encounter (Signed)
[place] [phone] VO

## 2018-08-12 NOTE — Telephone Encounter (Signed)
Facility where pt is now residing calls for order to do COVID 19 testing due to SNF, wouls like VO, may we give them one?  [place] at [phone]

## 2018-08-13 NOTE — Telephone Encounter (Signed)
rtc to [place], gave VO for COVID testing

## 2018-08-13 NOTE — Telephone Encounter (Signed)
Yes

## 2018-08-14 ENCOUNTER — Telehealth: Payer: Self-pay | Admitting: *Deleted

## 2018-08-14 DIAGNOSIS — Z7951 Long term (current) use of inhaled steroids: Secondary | ICD-10-CM | POA: Diagnosis not present

## 2018-08-14 DIAGNOSIS — I480 Paroxysmal atrial fibrillation: Secondary | ICD-10-CM | POA: Diagnosis not present

## 2018-08-14 DIAGNOSIS — K5900 Constipation, unspecified: Secondary | ICD-10-CM | POA: Diagnosis not present

## 2018-08-14 DIAGNOSIS — G8929 Other chronic pain: Secondary | ICD-10-CM | POA: Diagnosis not present

## 2018-08-14 DIAGNOSIS — M549 Dorsalgia, unspecified: Secondary | ICD-10-CM | POA: Diagnosis not present

## 2018-08-14 DIAGNOSIS — Z7901 Long term (current) use of anticoagulants: Secondary | ICD-10-CM | POA: Diagnosis not present

## 2018-08-14 DIAGNOSIS — Z9981 Dependence on supplemental oxygen: Secondary | ICD-10-CM | POA: Diagnosis not present

## 2018-08-14 DIAGNOSIS — J449 Chronic obstructive pulmonary disease, unspecified: Secondary | ICD-10-CM | POA: Diagnosis not present

## 2018-08-14 DIAGNOSIS — J9610 Chronic respiratory failure, unspecified whether with hypoxia or hypercapnia: Secondary | ICD-10-CM | POA: Diagnosis not present

## 2018-08-14 DIAGNOSIS — I110 Hypertensive heart disease with heart failure: Secondary | ICD-10-CM | POA: Diagnosis not present

## 2018-08-14 DIAGNOSIS — I5032 Chronic diastolic (congestive) heart failure: Secondary | ICD-10-CM | POA: Diagnosis not present

## 2018-08-14 NOTE — Telephone Encounter (Signed)
Those 2 medications can be stopped.  Thanks for the update!

## 2018-08-14 NOTE — Telephone Encounter (Signed)
Facility calls to ask about meds again, do you want to continue: flovent HFA 44MCG/ACT inhaler inhale 1 puff 2x daily, have not refilled since 12/2016 levalbuterol 0.92m/3ml take 368my nebs every 8 hrs as needed for wheezing, have not filled since 01/2017? Please advise Jacqueline Nguyen states temp has been normal since PT called and pt does not have constant cough  Elizabeth [phone]

## 2018-08-14 NOTE — Telephone Encounter (Signed)
Thank you!  Sounds good

## 2018-08-14 NOTE — Telephone Encounter (Signed)
trish the [facility] PT calls to report she is seeing pt this am for PT, states pt oral temp 99.8 this am, pt states she has been coughing but PT states pt has not coughed in 30+ mins. No other complaints. PT will have facility staff monitor pt and call office for 101.0 oral temp

## 2018-08-16 ENCOUNTER — Telehealth: Payer: Self-pay | Admitting: Internal Medicine

## 2018-08-16 DIAGNOSIS — G8929 Other chronic pain: Secondary | ICD-10-CM | POA: Diagnosis not present

## 2018-08-16 DIAGNOSIS — J9610 Chronic respiratory failure, unspecified whether with hypoxia or hypercapnia: Secondary | ICD-10-CM | POA: Diagnosis not present

## 2018-08-16 DIAGNOSIS — Z9981 Dependence on supplemental oxygen: Secondary | ICD-10-CM | POA: Diagnosis not present

## 2018-08-16 DIAGNOSIS — I110 Hypertensive heart disease with heart failure: Secondary | ICD-10-CM | POA: Diagnosis not present

## 2018-08-16 DIAGNOSIS — M549 Dorsalgia, unspecified: Secondary | ICD-10-CM | POA: Diagnosis not present

## 2018-08-16 DIAGNOSIS — J449 Chronic obstructive pulmonary disease, unspecified: Secondary | ICD-10-CM | POA: Diagnosis not present

## 2018-08-16 DIAGNOSIS — I5032 Chronic diastolic (congestive) heart failure: Secondary | ICD-10-CM | POA: Diagnosis not present

## 2018-08-16 DIAGNOSIS — K5900 Constipation, unspecified: Secondary | ICD-10-CM | POA: Diagnosis not present

## 2018-08-16 DIAGNOSIS — Z7901 Long term (current) use of anticoagulants: Secondary | ICD-10-CM | POA: Diagnosis not present

## 2018-08-16 DIAGNOSIS — I480 Paroxysmal atrial fibrillation: Secondary | ICD-10-CM | POA: Diagnosis not present

## 2018-08-16 DIAGNOSIS — Z7951 Long term (current) use of inhaled steroids: Secondary | ICD-10-CM | POA: Diagnosis not present

## 2018-08-16 NOTE — Telephone Encounter (Addendum)
Returned call to [place]. No answer. Left detailed message on self-identified VM. Hubbard Hartshorn, RN, BSN

## 2018-08-16 NOTE — Telephone Encounter (Signed)
Agree with this plan.  Thank you!

## 2018-08-16 NOTE — Telephone Encounter (Signed)
Returned call to [place], [place] with [facility]. Verbal auth given for [facility] OT 1 week 1, 2 week 3 and 1 week 2. Will route to PCP for agreement/denial. Hubbard Hartshorn, RN, BSN

## 2018-08-16 NOTE — Telephone Encounter (Signed)
Meredith from [place] needs VO [phone]

## 2018-08-19 ENCOUNTER — Telehealth: Payer: Self-pay | Admitting: Internal Medicine

## 2018-08-19 DIAGNOSIS — M549 Dorsalgia, unspecified: Secondary | ICD-10-CM | POA: Diagnosis not present

## 2018-08-19 DIAGNOSIS — I5032 Chronic diastolic (congestive) heart failure: Secondary | ICD-10-CM | POA: Diagnosis not present

## 2018-08-19 DIAGNOSIS — K5900 Constipation, unspecified: Secondary | ICD-10-CM | POA: Diagnosis not present

## 2018-08-19 DIAGNOSIS — Z7951 Long term (current) use of inhaled steroids: Secondary | ICD-10-CM | POA: Diagnosis not present

## 2018-08-19 DIAGNOSIS — Z9981 Dependence on supplemental oxygen: Secondary | ICD-10-CM | POA: Diagnosis not present

## 2018-08-19 DIAGNOSIS — G8929 Other chronic pain: Secondary | ICD-10-CM | POA: Diagnosis not present

## 2018-08-19 DIAGNOSIS — Z7901 Long term (current) use of anticoagulants: Secondary | ICD-10-CM | POA: Diagnosis not present

## 2018-08-19 DIAGNOSIS — J449 Chronic obstructive pulmonary disease, unspecified: Secondary | ICD-10-CM | POA: Diagnosis not present

## 2018-08-19 DIAGNOSIS — J9610 Chronic respiratory failure, unspecified whether with hypoxia or hypercapnia: Secondary | ICD-10-CM | POA: Diagnosis not present

## 2018-08-19 DIAGNOSIS — I480 Paroxysmal atrial fibrillation: Secondary | ICD-10-CM | POA: Diagnosis not present

## 2018-08-19 DIAGNOSIS — I110 Hypertensive heart disease with heart failure: Secondary | ICD-10-CM | POA: Diagnosis not present

## 2018-08-19 NOTE — Telephone Encounter (Signed)
Returned call to [place]. Verbal Josem Kaufmann given for [facility] 1 week 1, 2 week 3, 1 week 4 and 2 prn visits. Julie's main goal is to maintain skin integrity. Patient is mostly immobile. Has skin tear right upper FA that is 90% scabbed over, but edges are pretty red. Almyra Free has placed petroleum gauze over and secured with Tegaderm. Will route to PCP for agreement/denial.

## 2018-08-19 NOTE — Telephone Encounter (Signed)
[facility] Case Manager requesting VO for [facility].

## 2018-08-19 NOTE — Telephone Encounter (Signed)
Agree with plan. Thank you

## 2018-08-20 ENCOUNTER — Other Ambulatory Visit: Payer: Self-pay | Admitting: Internal Medicine

## 2018-08-20 DIAGNOSIS — I480 Paroxysmal atrial fibrillation: Secondary | ICD-10-CM | POA: Diagnosis not present

## 2018-08-20 DIAGNOSIS — J9610 Chronic respiratory failure, unspecified whether with hypoxia or hypercapnia: Secondary | ICD-10-CM | POA: Diagnosis not present

## 2018-08-20 DIAGNOSIS — I110 Hypertensive heart disease with heart failure: Secondary | ICD-10-CM | POA: Diagnosis not present

## 2018-08-20 DIAGNOSIS — M549 Dorsalgia, unspecified: Secondary | ICD-10-CM | POA: Diagnosis not present

## 2018-08-20 DIAGNOSIS — G8929 Other chronic pain: Secondary | ICD-10-CM | POA: Diagnosis not present

## 2018-08-20 DIAGNOSIS — K5900 Constipation, unspecified: Secondary | ICD-10-CM | POA: Diagnosis not present

## 2018-08-20 DIAGNOSIS — Z7951 Long term (current) use of inhaled steroids: Secondary | ICD-10-CM | POA: Diagnosis not present

## 2018-08-20 DIAGNOSIS — I5032 Chronic diastolic (congestive) heart failure: Secondary | ICD-10-CM | POA: Diagnosis not present

## 2018-08-20 DIAGNOSIS — Z7901 Long term (current) use of anticoagulants: Secondary | ICD-10-CM | POA: Diagnosis not present

## 2018-08-20 DIAGNOSIS — J449 Chronic obstructive pulmonary disease, unspecified: Secondary | ICD-10-CM | POA: Diagnosis not present

## 2018-08-20 DIAGNOSIS — Z9981 Dependence on supplemental oxygen: Secondary | ICD-10-CM | POA: Diagnosis not present

## 2018-08-21 DIAGNOSIS — M549 Dorsalgia, unspecified: Secondary | ICD-10-CM | POA: Diagnosis not present

## 2018-08-21 DIAGNOSIS — K5900 Constipation, unspecified: Secondary | ICD-10-CM | POA: Diagnosis not present

## 2018-08-21 DIAGNOSIS — I5032 Chronic diastolic (congestive) heart failure: Secondary | ICD-10-CM | POA: Diagnosis not present

## 2018-08-21 DIAGNOSIS — I110 Hypertensive heart disease with heart failure: Secondary | ICD-10-CM | POA: Diagnosis not present

## 2018-08-21 DIAGNOSIS — Z7901 Long term (current) use of anticoagulants: Secondary | ICD-10-CM | POA: Diagnosis not present

## 2018-08-21 DIAGNOSIS — I480 Paroxysmal atrial fibrillation: Secondary | ICD-10-CM | POA: Diagnosis not present

## 2018-08-21 DIAGNOSIS — G8929 Other chronic pain: Secondary | ICD-10-CM | POA: Diagnosis not present

## 2018-08-21 DIAGNOSIS — J9610 Chronic respiratory failure, unspecified whether with hypoxia or hypercapnia: Secondary | ICD-10-CM | POA: Diagnosis not present

## 2018-08-21 DIAGNOSIS — Z7951 Long term (current) use of inhaled steroids: Secondary | ICD-10-CM | POA: Diagnosis not present

## 2018-08-21 DIAGNOSIS — J449 Chronic obstructive pulmonary disease, unspecified: Secondary | ICD-10-CM | POA: Diagnosis not present

## 2018-08-21 DIAGNOSIS — Z9981 Dependence on supplemental oxygen: Secondary | ICD-10-CM | POA: Diagnosis not present

## 2018-08-22 DIAGNOSIS — I110 Hypertensive heart disease with heart failure: Secondary | ICD-10-CM | POA: Diagnosis not present

## 2018-08-22 DIAGNOSIS — I5032 Chronic diastolic (congestive) heart failure: Secondary | ICD-10-CM | POA: Diagnosis not present

## 2018-08-22 DIAGNOSIS — G8929 Other chronic pain: Secondary | ICD-10-CM | POA: Diagnosis not present

## 2018-08-22 DIAGNOSIS — Z7901 Long term (current) use of anticoagulants: Secondary | ICD-10-CM | POA: Diagnosis not present

## 2018-08-22 DIAGNOSIS — Z9981 Dependence on supplemental oxygen: Secondary | ICD-10-CM | POA: Diagnosis not present

## 2018-08-22 DIAGNOSIS — Z7951 Long term (current) use of inhaled steroids: Secondary | ICD-10-CM | POA: Diagnosis not present

## 2018-08-22 DIAGNOSIS — M549 Dorsalgia, unspecified: Secondary | ICD-10-CM | POA: Diagnosis not present

## 2018-08-22 DIAGNOSIS — I480 Paroxysmal atrial fibrillation: Secondary | ICD-10-CM | POA: Diagnosis not present

## 2018-08-22 DIAGNOSIS — K5900 Constipation, unspecified: Secondary | ICD-10-CM | POA: Diagnosis not present

## 2018-08-22 DIAGNOSIS — J449 Chronic obstructive pulmonary disease, unspecified: Secondary | ICD-10-CM | POA: Diagnosis not present

## 2018-08-22 DIAGNOSIS — J9610 Chronic respiratory failure, unspecified whether with hypoxia or hypercapnia: Secondary | ICD-10-CM | POA: Diagnosis not present

## 2018-08-23 ENCOUNTER — Telehealth: Payer: Self-pay | Admitting: Internal Medicine

## 2018-08-23 DIAGNOSIS — G8929 Other chronic pain: Secondary | ICD-10-CM | POA: Diagnosis not present

## 2018-08-23 DIAGNOSIS — J449 Chronic obstructive pulmonary disease, unspecified: Secondary | ICD-10-CM | POA: Diagnosis not present

## 2018-08-23 DIAGNOSIS — Z7901 Long term (current) use of anticoagulants: Secondary | ICD-10-CM | POA: Diagnosis not present

## 2018-08-23 DIAGNOSIS — I5032 Chronic diastolic (congestive) heart failure: Secondary | ICD-10-CM | POA: Diagnosis not present

## 2018-08-23 DIAGNOSIS — J9610 Chronic respiratory failure, unspecified whether with hypoxia or hypercapnia: Secondary | ICD-10-CM | POA: Diagnosis not present

## 2018-08-23 DIAGNOSIS — Z9981 Dependence on supplemental oxygen: Secondary | ICD-10-CM | POA: Diagnosis not present

## 2018-08-23 DIAGNOSIS — Z7951 Long term (current) use of inhaled steroids: Secondary | ICD-10-CM | POA: Diagnosis not present

## 2018-08-23 DIAGNOSIS — I480 Paroxysmal atrial fibrillation: Secondary | ICD-10-CM | POA: Diagnosis not present

## 2018-08-23 DIAGNOSIS — M549 Dorsalgia, unspecified: Secondary | ICD-10-CM | POA: Diagnosis not present

## 2018-08-23 DIAGNOSIS — K5900 Constipation, unspecified: Secondary | ICD-10-CM | POA: Diagnosis not present

## 2018-08-23 DIAGNOSIS — I110 Hypertensive heart disease with heart failure: Secondary | ICD-10-CM | POA: Diagnosis not present

## 2018-08-23 NOTE — Telephone Encounter (Signed)
Meredith from [place] pt is having real bad anxiety; pls contact [phone]

## 2018-08-23 NOTE — Telephone Encounter (Signed)
Return call to [place] OT at [facility] - stated pt has increased anxiety,very anxious about being quarantined not combative. Please advise. Thanks

## 2018-08-23 NOTE — Telephone Encounter (Signed)
There are no short acting anxiety medications that would be safe for her given age and chronic respiratory failure.   Would do the following -   Increase Sertraline to 110m daily.  Can add aripirazole in 4 weeks for adjunctive therapy (have them call back) CBT therapy, counseling would be appropriate.  Grief Counseling would likely be appropriate too.   Do they have counselors there?   If not, she can be referred to [facility] our clinic for telehealth.   Thank you!

## 2018-08-27 DIAGNOSIS — J9610 Chronic respiratory failure, unspecified whether with hypoxia or hypercapnia: Secondary | ICD-10-CM | POA: Diagnosis not present

## 2018-08-27 DIAGNOSIS — Z7951 Long term (current) use of inhaled steroids: Secondary | ICD-10-CM | POA: Diagnosis not present

## 2018-08-27 DIAGNOSIS — Z7901 Long term (current) use of anticoagulants: Secondary | ICD-10-CM | POA: Diagnosis not present

## 2018-08-27 DIAGNOSIS — J449 Chronic obstructive pulmonary disease, unspecified: Secondary | ICD-10-CM | POA: Diagnosis not present

## 2018-08-27 DIAGNOSIS — I480 Paroxysmal atrial fibrillation: Secondary | ICD-10-CM | POA: Diagnosis not present

## 2018-08-27 DIAGNOSIS — I5032 Chronic diastolic (congestive) heart failure: Secondary | ICD-10-CM | POA: Diagnosis not present

## 2018-08-27 DIAGNOSIS — K5900 Constipation, unspecified: Secondary | ICD-10-CM | POA: Diagnosis not present

## 2018-08-27 DIAGNOSIS — Z9981 Dependence on supplemental oxygen: Secondary | ICD-10-CM | POA: Diagnosis not present

## 2018-08-27 DIAGNOSIS — G8929 Other chronic pain: Secondary | ICD-10-CM | POA: Diagnosis not present

## 2018-08-27 DIAGNOSIS — I110 Hypertensive heart disease with heart failure: Secondary | ICD-10-CM | POA: Diagnosis not present

## 2018-08-27 DIAGNOSIS — M549 Dorsalgia, unspecified: Secondary | ICD-10-CM | POA: Diagnosis not present

## 2018-08-27 NOTE — Telephone Encounter (Signed)
Call from [place] - stated unsure about counselors at [place].  Called [facility] - phone lines are down; stated she will have pt's nurse to call me back.

## 2018-08-27 NOTE — Telephone Encounter (Signed)
Called [place], [place] - left message for a return call.

## 2018-08-28 DIAGNOSIS — I10 Essential (primary) hypertension: Secondary | ICD-10-CM | POA: Diagnosis not present

## 2018-08-28 DIAGNOSIS — Z9981 Dependence on supplemental oxygen: Secondary | ICD-10-CM | POA: Diagnosis not present

## 2018-08-28 DIAGNOSIS — Z7901 Long term (current) use of anticoagulants: Secondary | ICD-10-CM | POA: Diagnosis not present

## 2018-08-28 DIAGNOSIS — G894 Chronic pain syndrome: Secondary | ICD-10-CM | POA: Diagnosis not present

## 2018-08-28 DIAGNOSIS — Z7951 Long term (current) use of inhaled steroids: Secondary | ICD-10-CM | POA: Diagnosis not present

## 2018-08-28 DIAGNOSIS — B372 Candidiasis of skin and nail: Secondary | ICD-10-CM | POA: Diagnosis not present

## 2018-08-28 DIAGNOSIS — J42 Unspecified chronic bronchitis: Secondary | ICD-10-CM | POA: Diagnosis not present

## 2018-08-28 DIAGNOSIS — I110 Hypertensive heart disease with heart failure: Secondary | ICD-10-CM | POA: Diagnosis not present

## 2018-08-28 DIAGNOSIS — J9610 Chronic respiratory failure, unspecified whether with hypoxia or hypercapnia: Secondary | ICD-10-CM | POA: Diagnosis not present

## 2018-08-28 DIAGNOSIS — M549 Dorsalgia, unspecified: Secondary | ICD-10-CM | POA: Diagnosis not present

## 2018-08-28 DIAGNOSIS — G4733 Obstructive sleep apnea (adult) (pediatric): Secondary | ICD-10-CM | POA: Diagnosis not present

## 2018-08-28 DIAGNOSIS — J449 Chronic obstructive pulmonary disease, unspecified: Secondary | ICD-10-CM | POA: Diagnosis not present

## 2018-08-28 DIAGNOSIS — G8929 Other chronic pain: Secondary | ICD-10-CM | POA: Diagnosis not present

## 2018-08-28 DIAGNOSIS — I5032 Chronic diastolic (congestive) heart failure: Secondary | ICD-10-CM | POA: Diagnosis not present

## 2018-08-28 DIAGNOSIS — I480 Paroxysmal atrial fibrillation: Secondary | ICD-10-CM | POA: Diagnosis not present

## 2018-08-28 DIAGNOSIS — K5900 Constipation, unspecified: Secondary | ICD-10-CM | POA: Diagnosis not present

## 2018-08-28 NOTE — Telephone Encounter (Signed)
Thank you for following up Jacqueline Nguyen.    No change to medications.    She is certainly still Grieving over the loss of [name].  Counseling would be a great help.

## 2018-08-28 NOTE — Telephone Encounter (Signed)
Talked to pt's nurse, Tharon Aquas, about pt's anxiety. Stated pt is doing ok now; he did not think she needs an increase of sertraline. And stated they do have counselors and pt is being set-up with someone (he mentioned pt loosing a son "several months ago").

## 2018-08-29 DIAGNOSIS — G8929 Other chronic pain: Secondary | ICD-10-CM | POA: Diagnosis not present

## 2018-08-29 DIAGNOSIS — Z7901 Long term (current) use of anticoagulants: Secondary | ICD-10-CM | POA: Diagnosis not present

## 2018-08-29 DIAGNOSIS — Z9981 Dependence on supplemental oxygen: Secondary | ICD-10-CM | POA: Diagnosis not present

## 2018-08-29 DIAGNOSIS — I5032 Chronic diastolic (congestive) heart failure: Secondary | ICD-10-CM | POA: Diagnosis not present

## 2018-08-29 DIAGNOSIS — I480 Paroxysmal atrial fibrillation: Secondary | ICD-10-CM | POA: Diagnosis not present

## 2018-08-29 DIAGNOSIS — J449 Chronic obstructive pulmonary disease, unspecified: Secondary | ICD-10-CM | POA: Diagnosis not present

## 2018-08-29 DIAGNOSIS — K5900 Constipation, unspecified: Secondary | ICD-10-CM | POA: Diagnosis not present

## 2018-08-29 DIAGNOSIS — J9610 Chronic respiratory failure, unspecified whether with hypoxia or hypercapnia: Secondary | ICD-10-CM | POA: Diagnosis not present

## 2018-08-29 DIAGNOSIS — Z7951 Long term (current) use of inhaled steroids: Secondary | ICD-10-CM | POA: Diagnosis not present

## 2018-08-29 DIAGNOSIS — M549 Dorsalgia, unspecified: Secondary | ICD-10-CM | POA: Diagnosis not present

## 2018-08-29 DIAGNOSIS — I110 Hypertensive heart disease with heart failure: Secondary | ICD-10-CM | POA: Diagnosis not present

## 2018-08-30 DIAGNOSIS — J9610 Chronic respiratory failure, unspecified whether with hypoxia or hypercapnia: Secondary | ICD-10-CM | POA: Diagnosis not present

## 2018-08-30 DIAGNOSIS — I110 Hypertensive heart disease with heart failure: Secondary | ICD-10-CM | POA: Diagnosis not present

## 2018-08-30 DIAGNOSIS — G8929 Other chronic pain: Secondary | ICD-10-CM | POA: Diagnosis not present

## 2018-08-30 DIAGNOSIS — I5032 Chronic diastolic (congestive) heart failure: Secondary | ICD-10-CM | POA: Diagnosis not present

## 2018-08-30 DIAGNOSIS — I480 Paroxysmal atrial fibrillation: Secondary | ICD-10-CM | POA: Diagnosis not present

## 2018-08-30 DIAGNOSIS — M549 Dorsalgia, unspecified: Secondary | ICD-10-CM | POA: Diagnosis not present

## 2018-08-30 DIAGNOSIS — Z7901 Long term (current) use of anticoagulants: Secondary | ICD-10-CM | POA: Diagnosis not present

## 2018-08-30 DIAGNOSIS — J449 Chronic obstructive pulmonary disease, unspecified: Secondary | ICD-10-CM | POA: Diagnosis not present

## 2018-08-30 DIAGNOSIS — Z9981 Dependence on supplemental oxygen: Secondary | ICD-10-CM | POA: Diagnosis not present

## 2018-08-30 DIAGNOSIS — Z7951 Long term (current) use of inhaled steroids: Secondary | ICD-10-CM | POA: Diagnosis not present

## 2018-08-30 DIAGNOSIS — K5900 Constipation, unspecified: Secondary | ICD-10-CM | POA: Diagnosis not present

## 2018-09-02 DIAGNOSIS — I110 Hypertensive heart disease with heart failure: Secondary | ICD-10-CM | POA: Diagnosis not present

## 2018-09-02 DIAGNOSIS — Z7901 Long term (current) use of anticoagulants: Secondary | ICD-10-CM | POA: Diagnosis not present

## 2018-09-02 DIAGNOSIS — G8929 Other chronic pain: Secondary | ICD-10-CM | POA: Diagnosis not present

## 2018-09-02 DIAGNOSIS — J449 Chronic obstructive pulmonary disease, unspecified: Secondary | ICD-10-CM | POA: Diagnosis not present

## 2018-09-02 DIAGNOSIS — K5900 Constipation, unspecified: Secondary | ICD-10-CM | POA: Diagnosis not present

## 2018-09-02 DIAGNOSIS — Z9981 Dependence on supplemental oxygen: Secondary | ICD-10-CM | POA: Diagnosis not present

## 2018-09-02 DIAGNOSIS — J9610 Chronic respiratory failure, unspecified whether with hypoxia or hypercapnia: Secondary | ICD-10-CM | POA: Diagnosis not present

## 2018-09-02 DIAGNOSIS — M549 Dorsalgia, unspecified: Secondary | ICD-10-CM | POA: Diagnosis not present

## 2018-09-02 DIAGNOSIS — I5032 Chronic diastolic (congestive) heart failure: Secondary | ICD-10-CM | POA: Diagnosis not present

## 2018-09-02 DIAGNOSIS — I480 Paroxysmal atrial fibrillation: Secondary | ICD-10-CM | POA: Diagnosis not present

## 2018-09-02 DIAGNOSIS — Z7951 Long term (current) use of inhaled steroids: Secondary | ICD-10-CM | POA: Diagnosis not present

## 2018-09-03 ENCOUNTER — Telehealth: Payer: Self-pay | Admitting: Internal Medicine

## 2018-09-03 DIAGNOSIS — K5900 Constipation, unspecified: Secondary | ICD-10-CM | POA: Diagnosis not present

## 2018-09-03 DIAGNOSIS — I5032 Chronic diastolic (congestive) heart failure: Secondary | ICD-10-CM | POA: Diagnosis not present

## 2018-09-03 DIAGNOSIS — M549 Dorsalgia, unspecified: Secondary | ICD-10-CM | POA: Diagnosis not present

## 2018-09-03 DIAGNOSIS — I480 Paroxysmal atrial fibrillation: Secondary | ICD-10-CM | POA: Diagnosis not present

## 2018-09-03 DIAGNOSIS — J9610 Chronic respiratory failure, unspecified whether with hypoxia or hypercapnia: Secondary | ICD-10-CM | POA: Diagnosis not present

## 2018-09-03 DIAGNOSIS — G8929 Other chronic pain: Secondary | ICD-10-CM | POA: Diagnosis not present

## 2018-09-03 DIAGNOSIS — J449 Chronic obstructive pulmonary disease, unspecified: Secondary | ICD-10-CM | POA: Diagnosis not present

## 2018-09-03 DIAGNOSIS — Z7951 Long term (current) use of inhaled steroids: Secondary | ICD-10-CM | POA: Diagnosis not present

## 2018-09-03 DIAGNOSIS — I110 Hypertensive heart disease with heart failure: Secondary | ICD-10-CM | POA: Diagnosis not present

## 2018-09-03 DIAGNOSIS — Z9981 Dependence on supplemental oxygen: Secondary | ICD-10-CM | POA: Diagnosis not present

## 2018-09-03 DIAGNOSIS — Z7901 Long term (current) use of anticoagulants: Secondary | ICD-10-CM | POA: Diagnosis not present

## 2018-09-03 NOTE — Telephone Encounter (Signed)
Return call to [place], [place] at [place].  Wants VO to start 1X/week visits to begin this week. VO given.  Will forward to Dr. Daryll Drown for agreement or denial. Laurence Compton, RN,BSN

## 2018-09-03 NOTE — Telephone Encounter (Signed)
Jacqueline Nguyen from [facility] needs VO

## 2018-09-03 NOTE — Telephone Encounter (Signed)
Agree with plan 

## 2018-09-04 DIAGNOSIS — I10 Essential (primary) hypertension: Secondary | ICD-10-CM | POA: Diagnosis not present

## 2018-09-04 DIAGNOSIS — B372 Candidiasis of skin and nail: Secondary | ICD-10-CM | POA: Diagnosis not present

## 2018-09-04 DIAGNOSIS — G4733 Obstructive sleep apnea (adult) (pediatric): Secondary | ICD-10-CM | POA: Diagnosis not present

## 2018-09-04 DIAGNOSIS — J3489 Other specified disorders of nose and nasal sinuses: Secondary | ICD-10-CM | POA: Diagnosis not present

## 2018-09-05 DIAGNOSIS — K5900 Constipation, unspecified: Secondary | ICD-10-CM | POA: Diagnosis not present

## 2018-09-05 DIAGNOSIS — Z7951 Long term (current) use of inhaled steroids: Secondary | ICD-10-CM | POA: Diagnosis not present

## 2018-09-05 DIAGNOSIS — Z9981 Dependence on supplemental oxygen: Secondary | ICD-10-CM | POA: Diagnosis not present

## 2018-09-05 DIAGNOSIS — M549 Dorsalgia, unspecified: Secondary | ICD-10-CM | POA: Diagnosis not present

## 2018-09-05 DIAGNOSIS — G8929 Other chronic pain: Secondary | ICD-10-CM | POA: Diagnosis not present

## 2018-09-05 DIAGNOSIS — J449 Chronic obstructive pulmonary disease, unspecified: Secondary | ICD-10-CM | POA: Diagnosis not present

## 2018-09-05 DIAGNOSIS — J9610 Chronic respiratory failure, unspecified whether with hypoxia or hypercapnia: Secondary | ICD-10-CM | POA: Diagnosis not present

## 2018-09-05 DIAGNOSIS — I480 Paroxysmal atrial fibrillation: Secondary | ICD-10-CM | POA: Diagnosis not present

## 2018-09-05 DIAGNOSIS — I110 Hypertensive heart disease with heart failure: Secondary | ICD-10-CM | POA: Diagnosis not present

## 2018-09-05 DIAGNOSIS — Z7901 Long term (current) use of anticoagulants: Secondary | ICD-10-CM | POA: Diagnosis not present

## 2018-09-05 DIAGNOSIS — I5032 Chronic diastolic (congestive) heart failure: Secondary | ICD-10-CM | POA: Diagnosis not present

## 2018-09-06 DIAGNOSIS — I110 Hypertensive heart disease with heart failure: Secondary | ICD-10-CM | POA: Diagnosis not present

## 2018-09-06 DIAGNOSIS — Z7901 Long term (current) use of anticoagulants: Secondary | ICD-10-CM | POA: Diagnosis not present

## 2018-09-06 DIAGNOSIS — Z9981 Dependence on supplemental oxygen: Secondary | ICD-10-CM | POA: Diagnosis not present

## 2018-09-06 DIAGNOSIS — J9610 Chronic respiratory failure, unspecified whether with hypoxia or hypercapnia: Secondary | ICD-10-CM | POA: Diagnosis not present

## 2018-09-06 DIAGNOSIS — Z7951 Long term (current) use of inhaled steroids: Secondary | ICD-10-CM | POA: Diagnosis not present

## 2018-09-06 DIAGNOSIS — K5900 Constipation, unspecified: Secondary | ICD-10-CM | POA: Diagnosis not present

## 2018-09-06 DIAGNOSIS — I480 Paroxysmal atrial fibrillation: Secondary | ICD-10-CM | POA: Diagnosis not present

## 2018-09-06 DIAGNOSIS — I5032 Chronic diastolic (congestive) heart failure: Secondary | ICD-10-CM | POA: Diagnosis not present

## 2018-09-06 DIAGNOSIS — M549 Dorsalgia, unspecified: Secondary | ICD-10-CM | POA: Diagnosis not present

## 2018-09-06 DIAGNOSIS — G8929 Other chronic pain: Secondary | ICD-10-CM | POA: Diagnosis not present

## 2018-09-06 DIAGNOSIS — J449 Chronic obstructive pulmonary disease, unspecified: Secondary | ICD-10-CM | POA: Diagnosis not present

## 2018-09-09 DIAGNOSIS — J449 Chronic obstructive pulmonary disease, unspecified: Secondary | ICD-10-CM | POA: Diagnosis not present

## 2018-09-09 DIAGNOSIS — Z7951 Long term (current) use of inhaled steroids: Secondary | ICD-10-CM | POA: Diagnosis not present

## 2018-09-09 DIAGNOSIS — I480 Paroxysmal atrial fibrillation: Secondary | ICD-10-CM | POA: Diagnosis not present

## 2018-09-09 DIAGNOSIS — Z9981 Dependence on supplemental oxygen: Secondary | ICD-10-CM | POA: Diagnosis not present

## 2018-09-09 DIAGNOSIS — G8929 Other chronic pain: Secondary | ICD-10-CM | POA: Diagnosis not present

## 2018-09-09 DIAGNOSIS — I110 Hypertensive heart disease with heart failure: Secondary | ICD-10-CM | POA: Diagnosis not present

## 2018-09-09 DIAGNOSIS — J9610 Chronic respiratory failure, unspecified whether with hypoxia or hypercapnia: Secondary | ICD-10-CM | POA: Diagnosis not present

## 2018-09-09 DIAGNOSIS — Z7901 Long term (current) use of anticoagulants: Secondary | ICD-10-CM | POA: Diagnosis not present

## 2018-09-09 DIAGNOSIS — K5900 Constipation, unspecified: Secondary | ICD-10-CM | POA: Diagnosis not present

## 2018-09-09 DIAGNOSIS — I5032 Chronic diastolic (congestive) heart failure: Secondary | ICD-10-CM | POA: Diagnosis not present

## 2018-09-09 DIAGNOSIS — M549 Dorsalgia, unspecified: Secondary | ICD-10-CM | POA: Diagnosis not present

## 2018-09-10 DIAGNOSIS — G8929 Other chronic pain: Secondary | ICD-10-CM | POA: Diagnosis not present

## 2018-09-10 DIAGNOSIS — I110 Hypertensive heart disease with heart failure: Secondary | ICD-10-CM | POA: Diagnosis not present

## 2018-09-10 DIAGNOSIS — Z7951 Long term (current) use of inhaled steroids: Secondary | ICD-10-CM | POA: Diagnosis not present

## 2018-09-10 DIAGNOSIS — I480 Paroxysmal atrial fibrillation: Secondary | ICD-10-CM | POA: Diagnosis not present

## 2018-09-10 DIAGNOSIS — Z9981 Dependence on supplemental oxygen: Secondary | ICD-10-CM | POA: Diagnosis not present

## 2018-09-10 DIAGNOSIS — M549 Dorsalgia, unspecified: Secondary | ICD-10-CM | POA: Diagnosis not present

## 2018-09-10 DIAGNOSIS — Z7901 Long term (current) use of anticoagulants: Secondary | ICD-10-CM | POA: Diagnosis not present

## 2018-09-10 DIAGNOSIS — J449 Chronic obstructive pulmonary disease, unspecified: Secondary | ICD-10-CM | POA: Diagnosis not present

## 2018-09-10 DIAGNOSIS — J9610 Chronic respiratory failure, unspecified whether with hypoxia or hypercapnia: Secondary | ICD-10-CM | POA: Diagnosis not present

## 2018-09-10 DIAGNOSIS — K5900 Constipation, unspecified: Secondary | ICD-10-CM | POA: Diagnosis not present

## 2018-09-10 DIAGNOSIS — I5032 Chronic diastolic (congestive) heart failure: Secondary | ICD-10-CM | POA: Diagnosis not present

## 2018-09-11 DIAGNOSIS — J3489 Other specified disorders of nose and nasal sinuses: Secondary | ICD-10-CM | POA: Diagnosis not present

## 2018-09-11 DIAGNOSIS — M549 Dorsalgia, unspecified: Secondary | ICD-10-CM | POA: Diagnosis not present

## 2018-09-11 DIAGNOSIS — I10 Essential (primary) hypertension: Secondary | ICD-10-CM | POA: Diagnosis not present

## 2018-09-11 DIAGNOSIS — Z9981 Dependence on supplemental oxygen: Secondary | ICD-10-CM | POA: Diagnosis not present

## 2018-09-11 DIAGNOSIS — Z7951 Long term (current) use of inhaled steroids: Secondary | ICD-10-CM | POA: Diagnosis not present

## 2018-09-11 DIAGNOSIS — K5900 Constipation, unspecified: Secondary | ICD-10-CM | POA: Diagnosis not present

## 2018-09-11 DIAGNOSIS — B372 Candidiasis of skin and nail: Secondary | ICD-10-CM | POA: Diagnosis not present

## 2018-09-11 DIAGNOSIS — G4733 Obstructive sleep apnea (adult) (pediatric): Secondary | ICD-10-CM | POA: Diagnosis not present

## 2018-09-11 DIAGNOSIS — G8929 Other chronic pain: Secondary | ICD-10-CM | POA: Diagnosis not present

## 2018-09-11 DIAGNOSIS — Z7901 Long term (current) use of anticoagulants: Secondary | ICD-10-CM | POA: Diagnosis not present

## 2018-09-11 DIAGNOSIS — J9610 Chronic respiratory failure, unspecified whether with hypoxia or hypercapnia: Secondary | ICD-10-CM | POA: Diagnosis not present

## 2018-09-11 DIAGNOSIS — I480 Paroxysmal atrial fibrillation: Secondary | ICD-10-CM | POA: Diagnosis not present

## 2018-09-11 DIAGNOSIS — J449 Chronic obstructive pulmonary disease, unspecified: Secondary | ICD-10-CM | POA: Diagnosis not present

## 2018-09-11 DIAGNOSIS — I5032 Chronic diastolic (congestive) heart failure: Secondary | ICD-10-CM | POA: Diagnosis not present

## 2018-09-11 DIAGNOSIS — I110 Hypertensive heart disease with heart failure: Secondary | ICD-10-CM | POA: Diagnosis not present

## 2018-09-12 DIAGNOSIS — Z7951 Long term (current) use of inhaled steroids: Secondary | ICD-10-CM | POA: Diagnosis not present

## 2018-09-12 DIAGNOSIS — I480 Paroxysmal atrial fibrillation: Secondary | ICD-10-CM | POA: Diagnosis not present

## 2018-09-12 DIAGNOSIS — Z9981 Dependence on supplemental oxygen: Secondary | ICD-10-CM | POA: Diagnosis not present

## 2018-09-12 DIAGNOSIS — K5900 Constipation, unspecified: Secondary | ICD-10-CM | POA: Diagnosis not present

## 2018-09-12 DIAGNOSIS — M549 Dorsalgia, unspecified: Secondary | ICD-10-CM | POA: Diagnosis not present

## 2018-09-12 DIAGNOSIS — Z7901 Long term (current) use of anticoagulants: Secondary | ICD-10-CM | POA: Diagnosis not present

## 2018-09-12 DIAGNOSIS — J9610 Chronic respiratory failure, unspecified whether with hypoxia or hypercapnia: Secondary | ICD-10-CM | POA: Diagnosis not present

## 2018-09-12 DIAGNOSIS — I5032 Chronic diastolic (congestive) heart failure: Secondary | ICD-10-CM | POA: Diagnosis not present

## 2018-09-12 DIAGNOSIS — I110 Hypertensive heart disease with heart failure: Secondary | ICD-10-CM | POA: Diagnosis not present

## 2018-09-12 DIAGNOSIS — J449 Chronic obstructive pulmonary disease, unspecified: Secondary | ICD-10-CM | POA: Diagnosis not present

## 2018-09-12 DIAGNOSIS — G8929 Other chronic pain: Secondary | ICD-10-CM | POA: Diagnosis not present

## 2018-09-16 ENCOUNTER — Telehealth: Payer: Self-pay | Admitting: *Deleted

## 2018-09-16 DIAGNOSIS — Z7951 Long term (current) use of inhaled steroids: Secondary | ICD-10-CM | POA: Diagnosis not present

## 2018-09-16 DIAGNOSIS — J9610 Chronic respiratory failure, unspecified whether with hypoxia or hypercapnia: Secondary | ICD-10-CM | POA: Diagnosis not present

## 2018-09-16 DIAGNOSIS — I110 Hypertensive heart disease with heart failure: Secondary | ICD-10-CM | POA: Diagnosis not present

## 2018-09-16 DIAGNOSIS — I480 Paroxysmal atrial fibrillation: Secondary | ICD-10-CM | POA: Diagnosis not present

## 2018-09-16 DIAGNOSIS — M549 Dorsalgia, unspecified: Secondary | ICD-10-CM | POA: Diagnosis not present

## 2018-09-16 DIAGNOSIS — K5900 Constipation, unspecified: Secondary | ICD-10-CM | POA: Diagnosis not present

## 2018-09-16 DIAGNOSIS — Z9981 Dependence on supplemental oxygen: Secondary | ICD-10-CM | POA: Diagnosis not present

## 2018-09-16 DIAGNOSIS — G8929 Other chronic pain: Secondary | ICD-10-CM | POA: Diagnosis not present

## 2018-09-16 DIAGNOSIS — I5032 Chronic diastolic (congestive) heart failure: Secondary | ICD-10-CM | POA: Diagnosis not present

## 2018-09-16 DIAGNOSIS — Z7901 Long term (current) use of anticoagulants: Secondary | ICD-10-CM | POA: Diagnosis not present

## 2018-09-16 DIAGNOSIS — J449 Chronic obstructive pulmonary disease, unspecified: Secondary | ICD-10-CM | POA: Diagnosis not present

## 2018-09-16 NOTE — Telephone Encounter (Signed)
OT calls for a VO to change pcp to facility provided pcp. She is informed that pt or her POA needs to notify [facility] that pt/ POA have decided to change pcp to facility provided md. VO not given. Do you agree?

## 2018-09-16 NOTE — Telephone Encounter (Signed)
Agree.  The facility needs to send [place] confirmation from patient or POA.  Thanks

## 2018-09-18 DIAGNOSIS — Z9981 Dependence on supplemental oxygen: Secondary | ICD-10-CM | POA: Diagnosis not present

## 2018-09-18 DIAGNOSIS — I10 Essential (primary) hypertension: Secondary | ICD-10-CM | POA: Diagnosis not present

## 2018-09-18 DIAGNOSIS — M549 Dorsalgia, unspecified: Secondary | ICD-10-CM | POA: Diagnosis not present

## 2018-09-18 DIAGNOSIS — Z7951 Long term (current) use of inhaled steroids: Secondary | ICD-10-CM | POA: Diagnosis not present

## 2018-09-18 DIAGNOSIS — I5032 Chronic diastolic (congestive) heart failure: Secondary | ICD-10-CM | POA: Diagnosis not present

## 2018-09-18 DIAGNOSIS — I480 Paroxysmal atrial fibrillation: Secondary | ICD-10-CM | POA: Diagnosis not present

## 2018-09-18 DIAGNOSIS — I110 Hypertensive heart disease with heart failure: Secondary | ICD-10-CM | POA: Diagnosis not present

## 2018-09-18 DIAGNOSIS — B372 Candidiasis of skin and nail: Secondary | ICD-10-CM | POA: Diagnosis not present

## 2018-09-18 DIAGNOSIS — G8929 Other chronic pain: Secondary | ICD-10-CM | POA: Diagnosis not present

## 2018-09-18 DIAGNOSIS — K5900 Constipation, unspecified: Secondary | ICD-10-CM | POA: Diagnosis not present

## 2018-09-18 DIAGNOSIS — J9610 Chronic respiratory failure, unspecified whether with hypoxia or hypercapnia: Secondary | ICD-10-CM | POA: Diagnosis not present

## 2018-09-18 DIAGNOSIS — J449 Chronic obstructive pulmonary disease, unspecified: Secondary | ICD-10-CM | POA: Diagnosis not present

## 2018-09-18 DIAGNOSIS — G4733 Obstructive sleep apnea (adult) (pediatric): Secondary | ICD-10-CM | POA: Diagnosis not present

## 2018-09-18 DIAGNOSIS — Z7901 Long term (current) use of anticoagulants: Secondary | ICD-10-CM | POA: Diagnosis not present

## 2018-09-19 DIAGNOSIS — J449 Chronic obstructive pulmonary disease, unspecified: Secondary | ICD-10-CM | POA: Diagnosis not present

## 2018-09-19 DIAGNOSIS — Z9981 Dependence on supplemental oxygen: Secondary | ICD-10-CM | POA: Diagnosis not present

## 2018-09-19 DIAGNOSIS — G8929 Other chronic pain: Secondary | ICD-10-CM | POA: Diagnosis not present

## 2018-09-19 DIAGNOSIS — M549 Dorsalgia, unspecified: Secondary | ICD-10-CM | POA: Diagnosis not present

## 2018-09-19 DIAGNOSIS — I480 Paroxysmal atrial fibrillation: Secondary | ICD-10-CM | POA: Diagnosis not present

## 2018-09-19 DIAGNOSIS — Z7901 Long term (current) use of anticoagulants: Secondary | ICD-10-CM | POA: Diagnosis not present

## 2018-09-19 DIAGNOSIS — K5900 Constipation, unspecified: Secondary | ICD-10-CM | POA: Diagnosis not present

## 2018-09-19 DIAGNOSIS — Z7951 Long term (current) use of inhaled steroids: Secondary | ICD-10-CM | POA: Diagnosis not present

## 2018-09-19 DIAGNOSIS — J9610 Chronic respiratory failure, unspecified whether with hypoxia or hypercapnia: Secondary | ICD-10-CM | POA: Diagnosis not present

## 2018-09-19 DIAGNOSIS — I5032 Chronic diastolic (congestive) heart failure: Secondary | ICD-10-CM | POA: Diagnosis not present

## 2018-09-19 DIAGNOSIS — I110 Hypertensive heart disease with heart failure: Secondary | ICD-10-CM | POA: Diagnosis not present

## 2018-09-26 ENCOUNTER — Telehealth: Payer: Self-pay | Admitting: Internal Medicine

## 2018-09-26 NOTE — Telephone Encounter (Signed)
[facility] calling to report patient Visits have been placed on hold due to patient's Roommate has tested postive for COVID-19. Visits placed on hold for 2 weeks. Please call back if any questions.

## 2018-09-26 NOTE — Telephone Encounter (Signed)
noted 

## 2018-09-30 ENCOUNTER — Telehealth: Payer: Self-pay | Admitting: *Deleted

## 2018-09-30 NOTE — Telephone Encounter (Signed)
Call from [facility] - stated they have been seeing pt for PT/OT/nursing and her roommate has tested positive for covid-19. Wants to know if it's ok if they place PT/OT on hold  Until next week? But will continue nursing. Also stated pt has tested negative for covid-19. Thanks

## 2018-10-01 DIAGNOSIS — S41112A Laceration without foreign body of left upper arm, initial encounter: Secondary | ICD-10-CM | POA: Diagnosis not present

## 2018-10-01 DIAGNOSIS — Z66 Do not resuscitate: Secondary | ICD-10-CM | POA: Diagnosis not present

## 2018-10-01 DIAGNOSIS — R0602 Shortness of breath: Secondary | ICD-10-CM | POA: Diagnosis not present

## 2018-10-01 DIAGNOSIS — J9690 Respiratory failure, unspecified, unspecified whether with hypoxia or hypercapnia: Secondary | ICD-10-CM | POA: Diagnosis not present

## 2018-10-01 DIAGNOSIS — R4781 Slurred speech: Secondary | ICD-10-CM | POA: Diagnosis not present

## 2018-10-01 DIAGNOSIS — R29810 Facial weakness: Secondary | ICD-10-CM | POA: Diagnosis not present

## 2018-10-01 DIAGNOSIS — I10 Essential (primary) hypertension: Secondary | ICD-10-CM | POA: Diagnosis not present

## 2018-10-01 DIAGNOSIS — R402142 Coma scale, eyes open, spontaneous, at arrival to emergency department: Secondary | ICD-10-CM | POA: Diagnosis not present

## 2018-10-01 DIAGNOSIS — R079 Chest pain, unspecified: Secondary | ICD-10-CM | POA: Diagnosis not present

## 2018-10-01 DIAGNOSIS — Z79899 Other long term (current) drug therapy: Secondary | ICD-10-CM | POA: Diagnosis not present

## 2018-10-01 DIAGNOSIS — Z515 Encounter for palliative care: Secondary | ICD-10-CM | POA: Diagnosis not present

## 2018-10-01 DIAGNOSIS — R6521 Severe sepsis with septic shock: Secondary | ICD-10-CM | POA: Diagnosis not present

## 2018-10-01 DIAGNOSIS — I6389 Other cerebral infarction: Secondary | ICD-10-CM | POA: Diagnosis not present

## 2018-10-01 DIAGNOSIS — R404 Transient alteration of awareness: Secondary | ICD-10-CM | POA: Diagnosis not present

## 2018-10-01 DIAGNOSIS — R402242 Coma scale, best verbal response, confused conversation, at arrival to emergency department: Secondary | ICD-10-CM | POA: Diagnosis not present

## 2018-10-01 DIAGNOSIS — Z7901 Long term (current) use of anticoagulants: Secondary | ICD-10-CM | POA: Diagnosis not present

## 2018-10-01 DIAGNOSIS — R0603 Acute respiratory distress: Secondary | ICD-10-CM | POA: Diagnosis not present

## 2018-10-01 DIAGNOSIS — A419 Sepsis, unspecified organism: Secondary | ICD-10-CM | POA: Diagnosis not present

## 2018-10-01 DIAGNOSIS — I959 Hypotension, unspecified: Secondary | ICD-10-CM | POA: Diagnosis not present

## 2018-10-01 DIAGNOSIS — Z1159 Encounter for screening for other viral diseases: Secondary | ICD-10-CM | POA: Diagnosis not present

## 2018-10-01 DIAGNOSIS — R6520 Severe sepsis without septic shock: Secondary | ICD-10-CM | POA: Diagnosis not present

## 2018-10-01 DIAGNOSIS — R402352 Coma scale, best motor response, localizes pain, at arrival to emergency department: Secondary | ICD-10-CM | POA: Diagnosis not present

## 2018-10-01 DIAGNOSIS — R531 Weakness: Secondary | ICD-10-CM | POA: Diagnosis not present

## 2018-10-01 DIAGNOSIS — R0902 Hypoxemia: Secondary | ICD-10-CM | POA: Diagnosis not present

## 2018-10-01 DIAGNOSIS — I4891 Unspecified atrial fibrillation: Secondary | ICD-10-CM | POA: Diagnosis not present

## 2018-10-02 NOTE — Telephone Encounter (Signed)
Called [facility] - left message on self-identified vm "ok to hold PT/OT until next week per Dr Daryll Drown and to call back for any questions."

## 2018-10-02 NOTE — Telephone Encounter (Signed)
That is fine with me.  Thank you!

## 2018-10-02 DEATH — deceased

## 2019-01-17 ENCOUNTER — Ambulatory Visit: Payer: Medicare Other | Admitting: Internal Medicine
